# Patient Record
Sex: Male | Born: 1935 | Race: White | Hispanic: No | Marital: Married | Smoking: Former smoker
Health system: Southern US, Community
[De-identification: ages and names within clinical notes are randomized; demographics above are authoritative.]

## PROBLEM LIST (undated history)

## (undated) DIAGNOSIS — J449 Chronic obstructive pulmonary disease, unspecified: Secondary | ICD-10-CM

## (undated) DIAGNOSIS — Z8669 Personal history of other diseases of the nervous system and sense organs: Secondary | ICD-10-CM

## (undated) DIAGNOSIS — N400 Enlarged prostate without lower urinary tract symptoms: Secondary | ICD-10-CM

## (undated) DIAGNOSIS — M5136 Other intervertebral disc degeneration, lumbar region: Secondary | ICD-10-CM

## (undated) DIAGNOSIS — I5030 Unspecified diastolic (congestive) heart failure: Secondary | ICD-10-CM

## (undated) DIAGNOSIS — H269 Unspecified cataract: Secondary | ICD-10-CM

## (undated) DIAGNOSIS — D696 Thrombocytopenia, unspecified: Secondary | ICD-10-CM

## (undated) DIAGNOSIS — H409 Unspecified glaucoma: Secondary | ICD-10-CM

## (undated) DIAGNOSIS — I739 Peripheral vascular disease, unspecified: Secondary | ICD-10-CM

## (undated) DIAGNOSIS — M1990 Unspecified osteoarthritis, unspecified site: Secondary | ICD-10-CM

## (undated) DIAGNOSIS — I340 Nonrheumatic mitral (valve) insufficiency: Secondary | ICD-10-CM

## (undated) DIAGNOSIS — M51369 Other intervertebral disc degeneration, lumbar region without mention of lumbar back pain or lower extremity pain: Secondary | ICD-10-CM

## (undated) DIAGNOSIS — Z8739 Personal history of other diseases of the musculoskeletal system and connective tissue: Secondary | ICD-10-CM

## (undated) DIAGNOSIS — I10 Essential (primary) hypertension: Secondary | ICD-10-CM

## (undated) HISTORY — DX: Nonrheumatic mitral (valve) insufficiency: I34.0

## (undated) HISTORY — DX: Other intervertebral disc degeneration, lumbar region: M51.36

## (undated) HISTORY — DX: Unspecified osteoarthritis, unspecified site: M19.90

## (undated) HISTORY — DX: Personal history of other diseases of the nervous system and sense organs: Z86.69

## (undated) HISTORY — DX: Benign prostatic hyperplasia without lower urinary tract symptoms: N40.0

## (undated) HISTORY — DX: Unspecified diastolic (congestive) heart failure: I50.30

## (undated) HISTORY — DX: Peripheral vascular disease, unspecified: I73.9

## (undated) HISTORY — DX: Unspecified glaucoma: H40.9

## (undated) HISTORY — DX: Unspecified cataract: H26.9

## (undated) HISTORY — DX: Personal history of other diseases of the musculoskeletal system and connective tissue: Z87.39

## (undated) HISTORY — DX: Thrombocytopenia, unspecified: D69.6

## (undated) HISTORY — PX: CARPAL TUNNEL RELEASE: SHX101

## (undated) HISTORY — DX: Other intervertebral disc degeneration, lumbar region without mention of lumbar back pain or lower extremity pain: M51.369

## (undated) HISTORY — PX: TOTAL KNEE ARTHROPLASTY: SHX125

## (undated) HISTORY — DX: Chronic obstructive pulmonary disease, unspecified: J44.9

---
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
## 2008-05-10 ENCOUNTER — Ambulatory Visit: Payer: Self-pay | Admitting: Orthopedic Surgery

## 2008-05-18 ENCOUNTER — Ambulatory Visit: Payer: Self-pay | Admitting: Orthopedic Surgery

## 2010-01-24 ENCOUNTER — Ambulatory Visit: Payer: Self-pay | Admitting: Family Medicine

## 2010-05-22 NOTE — Assessment & Plan Note (Signed)
Summary: FLU SHOT/EVM    The patient and/or caregiver has been counseled thoroughly with regard to medications prescribed including dosage, schedule, interactions, rationale for use, and possible side effects and they verbalize understanding.  Diagnoses and expected course of recovery discussed and will return if not improved as expected or if the condition worsens. Patient and/or caregiver verbalized understanding.    Immunizations Administered:  Influenza Vaccine:    Vaccine Type: FLULAVAL    Site: left deltoid    Mfr: GlaxoSmithKline    Dose: 0.5 ml    Route: IM    Given by: Levonne Spiller EMT-P    Exp. Date: 09/20/2010    Lot #: [id number]    VIS given: 11/14/09 version given January 24, 2010.  Flu Vaccine Consent Questions:    Do you have a history of severe allergic reactions to this vaccine? no    Any prior history of allergic reactions to egg and/or gelatin? no    Do you have a sensitivity to the preservative Thimersol? no    Do you have a past history of Guillan-Barre Syndrome? no    Do you currently have an acute febrile illness? no    Have you ever had a severe reaction to latex? no    Vaccine information given and explained to patient? yes

## 2011-09-20 DIAGNOSIS — I10 Essential (primary) hypertension: Secondary | ICD-10-CM | POA: Diagnosis not present

## 2011-09-20 DIAGNOSIS — Z0000 Encounter for general adult medical examination without abnormal findings: Secondary | ICD-10-CM | POA: Diagnosis not present

## 2011-09-20 DIAGNOSIS — N400 Enlarged prostate without lower urinary tract symptoms: Secondary | ICD-10-CM | POA: Diagnosis not present

## 2011-09-23 DIAGNOSIS — I10 Essential (primary) hypertension: Secondary | ICD-10-CM | POA: Diagnosis not present

## 2011-09-23 DIAGNOSIS — N400 Enlarged prostate without lower urinary tract symptoms: Secondary | ICD-10-CM | POA: Diagnosis not present

## 2011-09-23 DIAGNOSIS — Z0000 Encounter for general adult medical examination without abnormal findings: Secondary | ICD-10-CM | POA: Diagnosis not present

## 2011-09-30 DIAGNOSIS — R6889 Other general symptoms and signs: Secondary | ICD-10-CM | POA: Diagnosis not present

## 2011-10-10 ENCOUNTER — Ambulatory Visit: Payer: Self-pay | Admitting: Internal Medicine

## 2011-10-10 DIAGNOSIS — D696 Thrombocytopenia, unspecified: Secondary | ICD-10-CM | POA: Diagnosis not present

## 2011-10-10 DIAGNOSIS — Z87891 Personal history of nicotine dependence: Secondary | ICD-10-CM | POA: Diagnosis not present

## 2011-10-10 DIAGNOSIS — Z1159 Encounter for screening for other viral diseases: Secondary | ICD-10-CM | POA: Diagnosis not present

## 2011-10-10 DIAGNOSIS — I10 Essential (primary) hypertension: Secondary | ICD-10-CM | POA: Diagnosis not present

## 2011-10-10 DIAGNOSIS — M129 Arthropathy, unspecified: Secondary | ICD-10-CM | POA: Diagnosis not present

## 2011-10-10 DIAGNOSIS — Z79899 Other long term (current) drug therapy: Secondary | ICD-10-CM | POA: Diagnosis not present

## 2011-10-10 DIAGNOSIS — IMO0002 Reserved for concepts with insufficient information to code with codable children: Secondary | ICD-10-CM | POA: Diagnosis not present

## 2011-10-10 DIAGNOSIS — Z7982 Long term (current) use of aspirin: Secondary | ICD-10-CM | POA: Diagnosis not present

## 2011-10-10 LAB — CBC CANCER CENTER
Basophil #: 0 x10 3/mm (ref 0.0–0.1)
Basophil %: 0.8 %
HGB: 13.9 g/dL (ref 13.0–18.0)
MCH: 31.6 pg (ref 26.0–34.0)
MCHC: 33.3 g/dL (ref 32.0–36.0)
Neutrophil #: 3.4 x10 3/mm (ref 1.4–6.5)
Neutrophil %: 61.5 %
RBC: 4.39 10*6/uL — ABNORMAL LOW (ref 4.40–5.90)
RDW: 13.8 % (ref 11.5–14.5)
WBC: 5.6 x10 3/mm (ref 3.8–10.6)

## 2011-10-21 ENCOUNTER — Ambulatory Visit: Payer: Self-pay | Admitting: Internal Medicine

## 2011-10-21 DIAGNOSIS — M129 Arthropathy, unspecified: Secondary | ICD-10-CM | POA: Diagnosis not present

## 2011-10-21 DIAGNOSIS — Z7982 Long term (current) use of aspirin: Secondary | ICD-10-CM | POA: Diagnosis not present

## 2011-10-21 DIAGNOSIS — Z79899 Other long term (current) drug therapy: Secondary | ICD-10-CM | POA: Diagnosis not present

## 2011-10-21 DIAGNOSIS — K8020 Calculus of gallbladder without cholecystitis without obstruction: Secondary | ICD-10-CM | POA: Diagnosis not present

## 2011-10-21 DIAGNOSIS — IMO0002 Reserved for concepts with insufficient information to code with codable children: Secondary | ICD-10-CM | POA: Diagnosis not present

## 2011-10-21 DIAGNOSIS — I10 Essential (primary) hypertension: Secondary | ICD-10-CM | POA: Diagnosis not present

## 2011-10-21 DIAGNOSIS — Q619 Cystic kidney disease, unspecified: Secondary | ICD-10-CM | POA: Diagnosis not present

## 2011-10-21 DIAGNOSIS — Z87891 Personal history of nicotine dependence: Secondary | ICD-10-CM | POA: Diagnosis not present

## 2011-10-21 DIAGNOSIS — D696 Thrombocytopenia, unspecified: Secondary | ICD-10-CM | POA: Diagnosis not present

## 2011-10-23 DIAGNOSIS — N400 Enlarged prostate without lower urinary tract symptoms: Secondary | ICD-10-CM | POA: Diagnosis not present

## 2011-10-23 DIAGNOSIS — M79609 Pain in unspecified limb: Secondary | ICD-10-CM | POA: Diagnosis not present

## 2011-10-23 DIAGNOSIS — D696 Thrombocytopenia, unspecified: Secondary | ICD-10-CM | POA: Diagnosis not present

## 2011-11-07 DIAGNOSIS — D696 Thrombocytopenia, unspecified: Secondary | ICD-10-CM | POA: Diagnosis not present

## 2011-11-12 DIAGNOSIS — K8020 Calculus of gallbladder without cholecystitis without obstruction: Secondary | ICD-10-CM | POA: Diagnosis not present

## 2011-11-21 ENCOUNTER — Ambulatory Visit: Payer: Self-pay | Admitting: Internal Medicine

## 2011-11-21 DIAGNOSIS — D696 Thrombocytopenia, unspecified: Secondary | ICD-10-CM | POA: Diagnosis not present

## 2011-11-21 DIAGNOSIS — I10 Essential (primary) hypertension: Secondary | ICD-10-CM | POA: Diagnosis not present

## 2011-11-21 DIAGNOSIS — Q619 Cystic kidney disease, unspecified: Secondary | ICD-10-CM | POA: Diagnosis not present

## 2011-11-21 DIAGNOSIS — K8020 Calculus of gallbladder without cholecystitis without obstruction: Secondary | ICD-10-CM | POA: Diagnosis not present

## 2011-11-21 DIAGNOSIS — Z7982 Long term (current) use of aspirin: Secondary | ICD-10-CM | POA: Diagnosis not present

## 2011-11-21 DIAGNOSIS — Z79899 Other long term (current) drug therapy: Secondary | ICD-10-CM | POA: Diagnosis not present

## 2011-11-21 DIAGNOSIS — IMO0002 Reserved for concepts with insufficient information to code with codable children: Secondary | ICD-10-CM | POA: Diagnosis not present

## 2011-11-21 DIAGNOSIS — M129 Arthropathy, unspecified: Secondary | ICD-10-CM | POA: Diagnosis not present

## 2011-11-21 DIAGNOSIS — Z87891 Personal history of nicotine dependence: Secondary | ICD-10-CM | POA: Diagnosis not present

## 2011-12-12 DIAGNOSIS — D696 Thrombocytopenia, unspecified: Secondary | ICD-10-CM | POA: Diagnosis not present

## 2011-12-12 LAB — CBC CANCER CENTER
Basophil #: 0 x10 3/mm (ref 0.0–0.1)
Eosinophil #: 0.1 x10 3/mm (ref 0.0–0.7)
HCT: 43.3 % (ref 40.0–52.0)
HGB: 14.4 g/dL (ref 13.0–18.0)
Lymphocyte #: 1.3 x10 3/mm (ref 1.0–3.6)
Lymphocyte %: 24.5 %
MCHC: 33.2 g/dL (ref 32.0–36.0)
MCV: 94 fL (ref 80–100)
Monocyte #: 0.4 x10 3/mm (ref 0.2–1.0)
Neutrophil #: 3.6 x10 3/mm (ref 1.4–6.5)
RBC: 4.6 10*6/uL (ref 4.40–5.90)
RDW: 14.6 % — ABNORMAL HIGH (ref 11.5–14.5)
WBC: 5.4 x10 3/mm (ref 3.8–10.6)

## 2011-12-12 LAB — URINALYSIS, COMPLETE
Bacteria: NONE SEEN
Ketone: NEGATIVE
Nitrite: NEGATIVE
Ph: 6 (ref 4.5–8.0)
Protein: NEGATIVE
Specific Gravity: 1.006 (ref 1.003–1.030)

## 2011-12-17 DIAGNOSIS — I10 Essential (primary) hypertension: Secondary | ICD-10-CM | POA: Diagnosis not present

## 2011-12-17 DIAGNOSIS — N400 Enlarged prostate without lower urinary tract symptoms: Secondary | ICD-10-CM | POA: Diagnosis not present

## 2011-12-17 DIAGNOSIS — M79609 Pain in unspecified limb: Secondary | ICD-10-CM | POA: Diagnosis not present

## 2011-12-17 DIAGNOSIS — D696 Thrombocytopenia, unspecified: Secondary | ICD-10-CM | POA: Diagnosis not present

## 2011-12-22 ENCOUNTER — Ambulatory Visit: Payer: Self-pay | Admitting: Internal Medicine

## 2011-12-22 DIAGNOSIS — D696 Thrombocytopenia, unspecified: Secondary | ICD-10-CM | POA: Diagnosis not present

## 2012-01-09 LAB — CANCER CTR PLATELET CT: Platelet: 88 x10 3/mm — ABNORMAL LOW (ref 150–440)

## 2012-01-15 DIAGNOSIS — M129 Arthropathy, unspecified: Secondary | ICD-10-CM | POA: Diagnosis not present

## 2012-01-15 DIAGNOSIS — I10 Essential (primary) hypertension: Secondary | ICD-10-CM | POA: Diagnosis not present

## 2012-01-15 DIAGNOSIS — N400 Enlarged prostate without lower urinary tract symptoms: Secondary | ICD-10-CM | POA: Diagnosis not present

## 2012-01-15 DIAGNOSIS — Z23 Encounter for immunization: Secondary | ICD-10-CM | POA: Diagnosis not present

## 2012-01-21 ENCOUNTER — Ambulatory Visit: Payer: Self-pay | Admitting: Internal Medicine

## 2012-01-21 DIAGNOSIS — IMO0002 Reserved for concepts with insufficient information to code with codable children: Secondary | ICD-10-CM | POA: Diagnosis not present

## 2012-01-21 DIAGNOSIS — Z87891 Personal history of nicotine dependence: Secondary | ICD-10-CM | POA: Diagnosis not present

## 2012-01-21 DIAGNOSIS — D696 Thrombocytopenia, unspecified: Secondary | ICD-10-CM | POA: Diagnosis not present

## 2012-01-21 DIAGNOSIS — I10 Essential (primary) hypertension: Secondary | ICD-10-CM | POA: Diagnosis not present

## 2012-01-21 DIAGNOSIS — Z79899 Other long term (current) drug therapy: Secondary | ICD-10-CM | POA: Diagnosis not present

## 2012-01-21 DIAGNOSIS — Z7982 Long term (current) use of aspirin: Secondary | ICD-10-CM | POA: Diagnosis not present

## 2012-01-21 DIAGNOSIS — M129 Arthropathy, unspecified: Secondary | ICD-10-CM | POA: Diagnosis not present

## 2012-02-06 LAB — CBC CANCER CENTER
Basophil %: 1.3 %
Eosinophil %: 2.2 %
HCT: 41.3 % (ref 40.0–52.0)
HGB: 13.8 g/dL (ref 13.0–18.0)
Lymphocyte #: 1.6 x10 3/mm (ref 1.0–3.6)
MCH: 31.4 pg (ref 26.0–34.0)
MCV: 94 fL (ref 80–100)
Monocyte #: 0.5 x10 3/mm (ref 0.2–1.0)
Neutrophil #: 3.3 x10 3/mm (ref 1.4–6.5)
Neutrophil %: 58.3 %
Platelet: 81 x10 3/mm — ABNORMAL LOW (ref 150–440)
RBC: 4.38 10*6/uL — ABNORMAL LOW (ref 4.40–5.90)

## 2012-02-19 DIAGNOSIS — D696 Thrombocytopenia, unspecified: Secondary | ICD-10-CM | POA: Diagnosis not present

## 2012-02-19 LAB — CBC CANCER CENTER
Basophil #: 0.1 x10 3/mm (ref 0.0–0.1)
Eosinophil %: 2.1 %
Lymphocyte #: 1.3 x10 3/mm (ref 1.0–3.6)
MCH: 31.1 pg (ref 26.0–34.0)
Monocyte #: 0.4 x10 3/mm (ref 0.2–1.0)
Neutrophil %: 62.5 %
Platelet: 84 x10 3/mm — ABNORMAL LOW (ref 150–440)
RBC: 4.47 10*6/uL (ref 4.40–5.90)
RDW: 14.2 % (ref 11.5–14.5)
WBC: 5 x10 3/mm (ref 3.8–10.6)

## 2012-02-21 ENCOUNTER — Ambulatory Visit: Payer: Self-pay | Admitting: Internal Medicine

## 2012-02-21 DIAGNOSIS — Z79899 Other long term (current) drug therapy: Secondary | ICD-10-CM | POA: Diagnosis not present

## 2012-02-21 DIAGNOSIS — I10 Essential (primary) hypertension: Secondary | ICD-10-CM | POA: Diagnosis not present

## 2012-02-21 DIAGNOSIS — Z87891 Personal history of nicotine dependence: Secondary | ICD-10-CM | POA: Diagnosis not present

## 2012-02-21 DIAGNOSIS — D696 Thrombocytopenia, unspecified: Secondary | ICD-10-CM | POA: Diagnosis not present

## 2012-02-21 DIAGNOSIS — Q619 Cystic kidney disease, unspecified: Secondary | ICD-10-CM | POA: Diagnosis not present

## 2012-02-21 DIAGNOSIS — K8020 Calculus of gallbladder without cholecystitis without obstruction: Secondary | ICD-10-CM | POA: Diagnosis not present

## 2012-02-21 DIAGNOSIS — Z7982 Long term (current) use of aspirin: Secondary | ICD-10-CM | POA: Diagnosis not present

## 2012-02-21 DIAGNOSIS — M129 Arthropathy, unspecified: Secondary | ICD-10-CM | POA: Diagnosis not present

## 2012-02-21 DIAGNOSIS — IMO0002 Reserved for concepts with insufficient information to code with codable children: Secondary | ICD-10-CM | POA: Diagnosis not present

## 2012-02-24 DIAGNOSIS — M129 Arthropathy, unspecified: Secondary | ICD-10-CM | POA: Diagnosis not present

## 2012-02-24 DIAGNOSIS — I10 Essential (primary) hypertension: Secondary | ICD-10-CM | POA: Diagnosis not present

## 2012-02-24 DIAGNOSIS — M79609 Pain in unspecified limb: Secondary | ICD-10-CM | POA: Diagnosis not present

## 2012-02-24 DIAGNOSIS — N400 Enlarged prostate without lower urinary tract symptoms: Secondary | ICD-10-CM | POA: Diagnosis not present

## 2012-03-16 LAB — CBC CANCER CENTER
Basophil #: 0 x10 3/mm (ref 0.0–0.1)
Basophil %: 0.1 %
Eosinophil #: 0.1 x10 3/mm (ref 0.0–0.7)
Eosinophil %: 1.6 %
HGB: 13.9 g/dL (ref 13.0–18.0)
Lymphocyte #: 1.3 x10 3/mm (ref 1.0–3.6)
Lymphocyte %: 23.4 %
MCV: 95 fL (ref 80–100)
Monocyte #: 0.4 x10 3/mm (ref 0.2–1.0)
Neutrophil #: 3.6 x10 3/mm (ref 1.4–6.5)
Neutrophil %: 67.1 %
RBC: 4.46 10*6/uL (ref 4.40–5.90)
RDW: 14.5 % (ref 11.5–14.5)
WBC: 5.4 x10 3/mm (ref 3.8–10.6)

## 2012-03-22 ENCOUNTER — Ambulatory Visit: Payer: Self-pay | Admitting: Internal Medicine

## 2012-04-22 ENCOUNTER — Ambulatory Visit: Payer: Self-pay | Admitting: Internal Medicine

## 2012-04-22 DIAGNOSIS — D696 Thrombocytopenia, unspecified: Secondary | ICD-10-CM | POA: Diagnosis not present

## 2012-05-20 LAB — CBC CANCER CENTER
Basophil #: 0.1 x10 3/mm (ref 0.0–0.1)
Basophil %: 1.1 %
Lymphocyte #: 1.5 x10 3/mm (ref 1.0–3.6)
MCH: 31.7 pg (ref 26.0–34.0)
MCHC: 34.4 g/dL (ref 32.0–36.0)
MCV: 92 fL (ref 80–100)
Monocyte #: 0.5 x10 3/mm (ref 0.2–1.0)
Neutrophil %: 62.8 %
RBC: 4.67 10*6/uL (ref 4.40–5.90)
RDW: 14.8 % — ABNORMAL HIGH (ref 11.5–14.5)
WBC: 5.7 x10 3/mm (ref 3.8–10.6)

## 2012-05-23 ENCOUNTER — Ambulatory Visit: Payer: Self-pay | Admitting: Internal Medicine

## 2012-07-21 ENCOUNTER — Ambulatory Visit: Payer: Self-pay | Admitting: Internal Medicine

## 2012-07-21 DIAGNOSIS — D696 Thrombocytopenia, unspecified: Secondary | ICD-10-CM | POA: Diagnosis not present

## 2012-08-19 LAB — CBC CANCER CENTER
Eosinophil #: 0.1 x10 3/mm (ref 0.0–0.7)
Eosinophil %: 2.3 %
HCT: 43.8 % (ref 40.0–52.0)
HGB: 14.7 g/dL (ref 13.0–18.0)
MCHC: 33.5 g/dL (ref 32.0–36.0)
MCV: 93 fL (ref 80–100)
Monocyte #: 0.4 x10 3/mm (ref 0.2–1.0)
Monocyte %: 7 %
Neutrophil #: 3.3 x10 3/mm (ref 1.4–6.5)
Neutrophil %: 60.2 %
Platelet: 81 x10 3/mm — ABNORMAL LOW (ref 150–440)
RBC: 4.7 10*6/uL (ref 4.40–5.90)
RDW: 14.8 % — ABNORMAL HIGH (ref 11.5–14.5)

## 2012-08-20 ENCOUNTER — Ambulatory Visit: Payer: Self-pay | Admitting: Internal Medicine

## 2012-10-20 ENCOUNTER — Ambulatory Visit: Payer: Self-pay | Admitting: Internal Medicine

## 2012-10-20 DIAGNOSIS — D696 Thrombocytopenia, unspecified: Secondary | ICD-10-CM | POA: Diagnosis not present

## 2012-11-18 LAB — CBC CANCER CENTER
Basophil #: 0 x10 3/mm (ref 0.0–0.1)
HCT: 40.6 % (ref 40.0–52.0)
HGB: 14.3 g/dL (ref 13.0–18.0)
Lymphocyte #: 1.5 x10 3/mm (ref 1.0–3.6)
Lymphocyte %: 29.3 %
MCH: 32.5 pg (ref 26.0–34.0)
MCV: 92 fL (ref 80–100)
Monocyte %: 8.4 %
Neutrophil #: 3.2 x10 3/mm (ref 1.4–6.5)
RDW: 14.8 % — ABNORMAL HIGH (ref 11.5–14.5)
WBC: 5.3 x10 3/mm (ref 3.8–10.6)

## 2012-11-20 ENCOUNTER — Ambulatory Visit: Payer: Self-pay | Admitting: Internal Medicine

## 2012-12-28 DIAGNOSIS — Z23 Encounter for immunization: Secondary | ICD-10-CM | POA: Diagnosis not present

## 2012-12-28 DIAGNOSIS — M129 Arthropathy, unspecified: Secondary | ICD-10-CM | POA: Diagnosis not present

## 2012-12-28 DIAGNOSIS — N400 Enlarged prostate without lower urinary tract symptoms: Secondary | ICD-10-CM | POA: Diagnosis not present

## 2012-12-28 DIAGNOSIS — R5381 Other malaise: Secondary | ICD-10-CM | POA: Diagnosis not present

## 2012-12-28 DIAGNOSIS — I10 Essential (primary) hypertension: Secondary | ICD-10-CM | POA: Diagnosis not present

## 2013-02-16 ENCOUNTER — Ambulatory Visit: Payer: Self-pay | Admitting: Internal Medicine

## 2013-02-16 DIAGNOSIS — I10 Essential (primary) hypertension: Secondary | ICD-10-CM | POA: Diagnosis not present

## 2013-02-16 DIAGNOSIS — K5900 Constipation, unspecified: Secondary | ICD-10-CM | POA: Diagnosis not present

## 2013-02-16 DIAGNOSIS — Z87891 Personal history of nicotine dependence: Secondary | ICD-10-CM | POA: Diagnosis not present

## 2013-02-16 DIAGNOSIS — M129 Arthropathy, unspecified: Secondary | ICD-10-CM | POA: Diagnosis not present

## 2013-02-16 DIAGNOSIS — D696 Thrombocytopenia, unspecified: Secondary | ICD-10-CM | POA: Diagnosis not present

## 2013-02-16 DIAGNOSIS — Z800 Family history of malignant neoplasm of digestive organs: Secondary | ICD-10-CM | POA: Diagnosis not present

## 2013-02-16 DIAGNOSIS — Z79899 Other long term (current) drug therapy: Secondary | ICD-10-CM | POA: Diagnosis not present

## 2013-02-16 DIAGNOSIS — IMO0002 Reserved for concepts with insufficient information to code with codable children: Secondary | ICD-10-CM | POA: Diagnosis not present

## 2013-02-16 DIAGNOSIS — M2550 Pain in unspecified joint: Secondary | ICD-10-CM | POA: Diagnosis not present

## 2013-02-16 DIAGNOSIS — Z7982 Long term (current) use of aspirin: Secondary | ICD-10-CM | POA: Diagnosis not present

## 2013-02-16 DIAGNOSIS — R109 Unspecified abdominal pain: Secondary | ICD-10-CM | POA: Diagnosis not present

## 2013-02-17 DIAGNOSIS — Z7982 Long term (current) use of aspirin: Secondary | ICD-10-CM | POA: Diagnosis not present

## 2013-02-17 DIAGNOSIS — I10 Essential (primary) hypertension: Secondary | ICD-10-CM | POA: Diagnosis not present

## 2013-02-17 DIAGNOSIS — R109 Unspecified abdominal pain: Secondary | ICD-10-CM | POA: Diagnosis not present

## 2013-02-17 DIAGNOSIS — D696 Thrombocytopenia, unspecified: Secondary | ICD-10-CM | POA: Diagnosis not present

## 2013-02-17 DIAGNOSIS — Z79899 Other long term (current) drug therapy: Secondary | ICD-10-CM | POA: Diagnosis not present

## 2013-02-17 DIAGNOSIS — K5900 Constipation, unspecified: Secondary | ICD-10-CM | POA: Diagnosis not present

## 2013-02-17 LAB — CBC CANCER CENTER
Basophil #: 0.1 x10 3/mm (ref 0.0–0.1)
Basophil %: 1.3 %
Eosinophil #: 0.1 x10 3/mm (ref 0.0–0.7)
Eosinophil %: 2.7 %
HCT: 40.6 % (ref 40.0–52.0)
HGB: 13.7 g/dL (ref 13.0–18.0)
Lymphocyte #: 1.4 x10 3/mm (ref 1.0–3.6)
MCH: 31.6 pg (ref 26.0–34.0)
MCHC: 33.7 g/dL (ref 32.0–36.0)
MCV: 94 fL (ref 80–100)
Monocyte #: 0.4 x10 3/mm (ref 0.2–1.0)
Monocyte %: 8.7 %
Neutrophil %: 60.3 %
Platelet: 78 x10 3/mm — ABNORMAL LOW (ref 150–440)
RBC: 4.34 10*6/uL — ABNORMAL LOW (ref 4.40–5.90)
RDW: 14.7 % — ABNORMAL HIGH (ref 11.5–14.5)

## 2013-02-20 ENCOUNTER — Ambulatory Visit: Payer: Self-pay | Admitting: Internal Medicine

## 2013-02-20 DIAGNOSIS — I10 Essential (primary) hypertension: Secondary | ICD-10-CM | POA: Diagnosis not present

## 2013-02-20 DIAGNOSIS — Z7982 Long term (current) use of aspirin: Secondary | ICD-10-CM | POA: Diagnosis not present

## 2013-02-20 DIAGNOSIS — D696 Thrombocytopenia, unspecified: Secondary | ICD-10-CM | POA: Diagnosis not present

## 2013-02-20 DIAGNOSIS — Z79899 Other long term (current) drug therapy: Secondary | ICD-10-CM | POA: Diagnosis not present

## 2013-03-05 DIAGNOSIS — R5381 Other malaise: Secondary | ICD-10-CM | POA: Diagnosis not present

## 2013-03-05 DIAGNOSIS — J069 Acute upper respiratory infection, unspecified: Secondary | ICD-10-CM | POA: Diagnosis not present

## 2013-03-10 DIAGNOSIS — R109 Unspecified abdominal pain: Secondary | ICD-10-CM | POA: Diagnosis not present

## 2013-03-10 DIAGNOSIS — R197 Diarrhea, unspecified: Secondary | ICD-10-CM | POA: Diagnosis not present

## 2013-03-15 DIAGNOSIS — D696 Thrombocytopenia, unspecified: Secondary | ICD-10-CM | POA: Diagnosis not present

## 2013-03-15 LAB — CBC CANCER CENTER
Basophil #: 0.1 x10 3/mm (ref 0.0–0.1)
Basophil %: 1 %
Eosinophil #: 0.1 x10 3/mm (ref 0.0–0.7)
Eosinophil %: 1.8 %
HGB: 14.1 g/dL (ref 13.0–18.0)
Lymphocyte %: 26.7 %
MCH: 31.3 pg (ref 26.0–34.0)
MCHC: 33.2 g/dL (ref 32.0–36.0)
Monocyte #: 0.5 x10 3/mm (ref 0.2–1.0)
Neutrophil #: 3.8 x10 3/mm (ref 1.4–6.5)
Neutrophil %: 62.1 %
Platelet: 92 x10 3/mm — ABNORMAL LOW (ref 150–440)
RBC: 4.52 10*6/uL (ref 4.40–5.90)
RDW: 14.9 % — ABNORMAL HIGH (ref 11.5–14.5)
WBC: 6 x10 3/mm (ref 3.8–10.6)

## 2013-03-22 ENCOUNTER — Ambulatory Visit: Payer: Self-pay | Admitting: Internal Medicine

## 2013-04-08 DIAGNOSIS — H409 Unspecified glaucoma: Secondary | ICD-10-CM | POA: Diagnosis not present

## 2013-04-08 DIAGNOSIS — R5381 Other malaise: Secondary | ICD-10-CM | POA: Diagnosis not present

## 2013-04-08 DIAGNOSIS — I10 Essential (primary) hypertension: Secondary | ICD-10-CM | POA: Diagnosis not present

## 2013-04-08 DIAGNOSIS — N400 Enlarged prostate without lower urinary tract symptoms: Secondary | ICD-10-CM | POA: Diagnosis not present

## 2013-04-22 HISTORY — PX: COLONOSCOPY: SHX174

## 2013-05-03 ENCOUNTER — Ambulatory Visit: Payer: Self-pay | Admitting: Internal Medicine

## 2013-05-03 DIAGNOSIS — D696 Thrombocytopenia, unspecified: Secondary | ICD-10-CM | POA: Diagnosis not present

## 2013-05-03 LAB — CBC CANCER CENTER
Basophil #: 0.1 x10 3/mm (ref 0.0–0.1)
Basophil %: 1.1 %
EOS PCT: 4.1 %
Eosinophil #: 0.2 x10 3/mm (ref 0.0–0.7)
HCT: 41.4 % (ref 40.0–52.0)
HGB: 13.6 g/dL (ref 13.0–18.0)
LYMPHS PCT: 23.2 %
Lymphocyte #: 1.3 x10 3/mm (ref 1.0–3.6)
MCH: 31.2 pg (ref 26.0–34.0)
MCHC: 32.8 g/dL (ref 32.0–36.0)
MCV: 95 fL (ref 80–100)
Monocyte #: 0.4 x10 3/mm (ref 0.2–1.0)
Monocyte %: 7.2 %
NEUTROS ABS: 3.7 x10 3/mm (ref 1.4–6.5)
NEUTROS PCT: 64.4 %
Platelet: 86 x10 3/mm — ABNORMAL LOW (ref 150–440)
RBC: 4.36 10*6/uL — ABNORMAL LOW (ref 4.40–5.90)
RDW: 14.9 % — AB (ref 11.5–14.5)
WBC: 5.8 x10 3/mm (ref 3.8–10.6)

## 2013-05-04 ENCOUNTER — Ambulatory Visit: Payer: Self-pay | Admitting: Gastroenterology

## 2013-05-04 DIAGNOSIS — K648 Other hemorrhoids: Secondary | ICD-10-CM | POA: Diagnosis not present

## 2013-05-04 DIAGNOSIS — Z96659 Presence of unspecified artificial knee joint: Secondary | ICD-10-CM | POA: Diagnosis not present

## 2013-05-04 DIAGNOSIS — Z87891 Personal history of nicotine dependence: Secondary | ICD-10-CM | POA: Diagnosis not present

## 2013-05-04 DIAGNOSIS — H409 Unspecified glaucoma: Secondary | ICD-10-CM | POA: Diagnosis not present

## 2013-05-04 DIAGNOSIS — I10 Essential (primary) hypertension: Secondary | ICD-10-CM | POA: Diagnosis not present

## 2013-05-04 DIAGNOSIS — Z79899 Other long term (current) drug therapy: Secondary | ICD-10-CM | POA: Diagnosis not present

## 2013-05-04 DIAGNOSIS — M1990 Unspecified osteoarthritis, unspecified site: Secondary | ICD-10-CM | POA: Diagnosis not present

## 2013-05-04 DIAGNOSIS — Z7982 Long term (current) use of aspirin: Secondary | ICD-10-CM | POA: Diagnosis not present

## 2013-05-04 DIAGNOSIS — R197 Diarrhea, unspecified: Secondary | ICD-10-CM | POA: Diagnosis not present

## 2013-05-04 DIAGNOSIS — D126 Benign neoplasm of colon, unspecified: Secondary | ICD-10-CM | POA: Diagnosis not present

## 2013-05-07 LAB — PATHOLOGY REPORT

## 2013-05-23 ENCOUNTER — Ambulatory Visit: Payer: Self-pay | Admitting: Internal Medicine

## 2013-07-07 DIAGNOSIS — N400 Enlarged prostate without lower urinary tract symptoms: Secondary | ICD-10-CM | POA: Diagnosis not present

## 2013-07-07 DIAGNOSIS — I10 Essential (primary) hypertension: Secondary | ICD-10-CM | POA: Diagnosis not present

## 2013-07-07 DIAGNOSIS — H409 Unspecified glaucoma: Secondary | ICD-10-CM | POA: Diagnosis not present

## 2013-07-07 DIAGNOSIS — M129 Arthropathy, unspecified: Secondary | ICD-10-CM | POA: Diagnosis not present

## 2013-07-08 DIAGNOSIS — I10 Essential (primary) hypertension: Secondary | ICD-10-CM | POA: Diagnosis not present

## 2013-07-30 ENCOUNTER — Ambulatory Visit: Payer: Self-pay | Admitting: Internal Medicine

## 2013-07-30 DIAGNOSIS — D696 Thrombocytopenia, unspecified: Secondary | ICD-10-CM | POA: Diagnosis not present

## 2013-08-02 DIAGNOSIS — D696 Thrombocytopenia, unspecified: Secondary | ICD-10-CM | POA: Diagnosis not present

## 2013-08-02 LAB — CBC CANCER CENTER
BASOS ABS: 0 x10 3/mm (ref 0.0–0.1)
Basophil %: 0.1 %
Eosinophil #: 0.1 x10 3/mm (ref 0.0–0.7)
Eosinophil %: 2.1 %
HCT: 41.9 % (ref 40.0–52.0)
HGB: 13.9 g/dL (ref 13.0–18.0)
LYMPHS PCT: 26.7 %
Lymphocyte #: 1.5 x10 3/mm (ref 1.0–3.6)
MCH: 31.3 pg (ref 26.0–34.0)
MCHC: 33.2 g/dL (ref 32.0–36.0)
MCV: 94 fL (ref 80–100)
MONOS PCT: 7.1 %
Monocyte #: 0.4 x10 3/mm (ref 0.2–1.0)
NEUTROS ABS: 3.5 x10 3/mm (ref 1.4–6.5)
Neutrophil %: 64 %
Platelet: 93 x10 3/mm — ABNORMAL LOW (ref 150–440)
RBC: 4.45 10*6/uL (ref 4.40–5.90)
RDW: 14.4 % (ref 11.5–14.5)
WBC: 5.5 x10 3/mm (ref 3.8–10.6)

## 2013-08-20 ENCOUNTER — Ambulatory Visit: Payer: Self-pay | Admitting: Internal Medicine

## 2013-09-08 DIAGNOSIS — M129 Arthropathy, unspecified: Secondary | ICD-10-CM | POA: Diagnosis not present

## 2013-09-08 DIAGNOSIS — N400 Enlarged prostate without lower urinary tract symptoms: Secondary | ICD-10-CM | POA: Diagnosis not present

## 2013-09-08 DIAGNOSIS — I10 Essential (primary) hypertension: Secondary | ICD-10-CM | POA: Diagnosis not present

## 2013-09-09 DIAGNOSIS — N400 Enlarged prostate without lower urinary tract symptoms: Secondary | ICD-10-CM | POA: Diagnosis not present

## 2013-11-01 ENCOUNTER — Ambulatory Visit: Payer: Self-pay | Admitting: Internal Medicine

## 2013-11-01 DIAGNOSIS — D696 Thrombocytopenia, unspecified: Secondary | ICD-10-CM | POA: Diagnosis not present

## 2013-11-01 LAB — CBC CANCER CENTER
Basophil #: 0.1 x10 3/mm (ref 0.0–0.1)
Basophil %: 0.9 %
Eosinophil #: 0.1 x10 3/mm (ref 0.0–0.7)
Eosinophil %: 1.8 %
HCT: 43.6 % (ref 40.0–52.0)
HGB: 14.6 g/dL (ref 13.0–18.0)
Lymphocyte #: 1.5 x10 3/mm (ref 1.0–3.6)
Lymphocyte %: 24.2 %
MCH: 32 pg (ref 26.0–34.0)
MCHC: 33.6 g/dL (ref 32.0–36.0)
MCV: 95 fL (ref 80–100)
MONOS PCT: 7.2 %
Monocyte #: 0.4 x10 3/mm (ref 0.2–1.0)
NEUTROS PCT: 65.9 %
Neutrophil #: 4.1 x10 3/mm (ref 1.4–6.5)
Platelet: 82 x10 3/mm — ABNORMAL LOW (ref 150–440)
RBC: 4.57 10*6/uL (ref 4.40–5.90)
RDW: 15.4 % — ABNORMAL HIGH (ref 11.5–14.5)
WBC: 6.2 x10 3/mm (ref 3.8–10.6)

## 2013-11-10 DIAGNOSIS — H409 Unspecified glaucoma: Secondary | ICD-10-CM | POA: Diagnosis not present

## 2013-11-10 DIAGNOSIS — M129 Arthropathy, unspecified: Secondary | ICD-10-CM | POA: Diagnosis not present

## 2013-11-10 DIAGNOSIS — I10 Essential (primary) hypertension: Secondary | ICD-10-CM | POA: Diagnosis not present

## 2013-11-10 DIAGNOSIS — N400 Enlarged prostate without lower urinary tract symptoms: Secondary | ICD-10-CM | POA: Diagnosis not present

## 2013-11-11 DIAGNOSIS — I10 Essential (primary) hypertension: Secondary | ICD-10-CM | POA: Diagnosis not present

## 2013-11-20 ENCOUNTER — Ambulatory Visit: Payer: Self-pay | Admitting: Internal Medicine

## 2014-01-31 ENCOUNTER — Ambulatory Visit: Payer: Self-pay | Admitting: Internal Medicine

## 2014-01-31 DIAGNOSIS — M129 Arthropathy, unspecified: Secondary | ICD-10-CM | POA: Diagnosis not present

## 2014-01-31 DIAGNOSIS — D696 Thrombocytopenia, unspecified: Secondary | ICD-10-CM | POA: Diagnosis not present

## 2014-01-31 DIAGNOSIS — Z800 Family history of malignant neoplasm of digestive organs: Secondary | ICD-10-CM | POA: Diagnosis not present

## 2014-01-31 DIAGNOSIS — M25571 Pain in right ankle and joints of right foot: Secondary | ICD-10-CM | POA: Diagnosis not present

## 2014-01-31 DIAGNOSIS — Z87891 Personal history of nicotine dependence: Secondary | ICD-10-CM | POA: Diagnosis not present

## 2014-01-31 DIAGNOSIS — I10 Essential (primary) hypertension: Secondary | ICD-10-CM | POA: Diagnosis not present

## 2014-01-31 DIAGNOSIS — M25572 Pain in left ankle and joints of left foot: Secondary | ICD-10-CM | POA: Diagnosis not present

## 2014-01-31 DIAGNOSIS — Z79899 Other long term (current) drug therapy: Secondary | ICD-10-CM | POA: Diagnosis not present

## 2014-01-31 DIAGNOSIS — H269 Unspecified cataract: Secondary | ICD-10-CM | POA: Diagnosis not present

## 2014-01-31 DIAGNOSIS — M549 Dorsalgia, unspecified: Secondary | ICD-10-CM | POA: Diagnosis not present

## 2014-01-31 DIAGNOSIS — Z96651 Presence of right artificial knee joint: Secondary | ICD-10-CM | POA: Diagnosis not present

## 2014-01-31 DIAGNOSIS — Z7982 Long term (current) use of aspirin: Secondary | ICD-10-CM | POA: Diagnosis not present

## 2014-01-31 DIAGNOSIS — H409 Unspecified glaucoma: Secondary | ICD-10-CM | POA: Diagnosis not present

## 2014-01-31 LAB — CBC CANCER CENTER
BASOS PCT: 1.3 %
Basophil #: 0.1 x10 3/mm (ref 0.0–0.1)
EOS ABS: 0.2 x10 3/mm (ref 0.0–0.7)
EOS PCT: 2.7 %
HCT: 44.5 % (ref 40.0–52.0)
HGB: 14.7 g/dL (ref 13.0–18.0)
LYMPHS ABS: 1.6 x10 3/mm (ref 1.0–3.6)
LYMPHS PCT: 27.1 %
MCH: 32.1 pg (ref 26.0–34.0)
MCHC: 33 g/dL (ref 32.0–36.0)
MCV: 97 fL (ref 80–100)
MONO ABS: 0.4 x10 3/mm (ref 0.2–1.0)
Monocyte %: 7.5 %
Neutrophil #: 3.6 x10 3/mm (ref 1.4–6.5)
Neutrophil %: 61.4 %
PLATELETS: 89 x10 3/mm — AB (ref 150–440)
RBC: 4.58 10*6/uL (ref 4.40–5.90)
RDW: 14.6 % — ABNORMAL HIGH (ref 11.5–14.5)
WBC: 5.8 x10 3/mm (ref 3.8–10.6)

## 2014-02-20 ENCOUNTER — Ambulatory Visit: Payer: Self-pay | Admitting: Internal Medicine

## 2014-02-23 DIAGNOSIS — M79671 Pain in right foot: Secondary | ICD-10-CM | POA: Diagnosis not present

## 2014-02-23 DIAGNOSIS — N400 Enlarged prostate without lower urinary tract symptoms: Secondary | ICD-10-CM | POA: Diagnosis not present

## 2014-02-23 DIAGNOSIS — H409 Unspecified glaucoma: Secondary | ICD-10-CM | POA: Diagnosis not present

## 2014-02-23 DIAGNOSIS — I10 Essential (primary) hypertension: Secondary | ICD-10-CM | POA: Diagnosis not present

## 2014-02-23 DIAGNOSIS — M1990 Unspecified osteoarthritis, unspecified site: Secondary | ICD-10-CM | POA: Diagnosis not present

## 2014-02-23 DIAGNOSIS — Z23 Encounter for immunization: Secondary | ICD-10-CM | POA: Diagnosis not present

## 2014-02-23 DIAGNOSIS — R7301 Impaired fasting glucose: Secondary | ICD-10-CM | POA: Diagnosis not present

## 2014-05-04 ENCOUNTER — Ambulatory Visit: Payer: Self-pay | Admitting: Internal Medicine

## 2014-05-04 DIAGNOSIS — D696 Thrombocytopenia, unspecified: Secondary | ICD-10-CM | POA: Diagnosis not present

## 2014-05-04 LAB — CBC CANCER CENTER
Basophil #: 0 x10 3/mm (ref 0.0–0.1)
Basophil %: 0.2 %
EOS ABS: 0.1 x10 3/mm (ref 0.0–0.7)
Eosinophil %: 2.4 %
HCT: 44.6 % (ref 40.0–52.0)
HGB: 15 g/dL (ref 13.0–18.0)
Lymphocyte #: 1.6 x10 3/mm (ref 1.0–3.6)
Lymphocyte %: 27.2 %
MCH: 32 pg (ref 26.0–34.0)
MCHC: 33.7 g/dL (ref 32.0–36.0)
MCV: 95 fL (ref 80–100)
MONO ABS: 0.5 x10 3/mm (ref 0.2–1.0)
Monocyte %: 8.2 %
NEUTROS ABS: 3.8 x10 3/mm (ref 1.4–6.5)
NEUTROS PCT: 62 %
Platelet: 95 x10 3/mm — ABNORMAL LOW (ref 150–440)
RBC: 4.69 10*6/uL (ref 4.40–5.90)
RDW: 14.9 % — AB (ref 11.5–14.5)
WBC: 6.1 x10 3/mm (ref 3.8–10.6)

## 2014-05-23 ENCOUNTER — Ambulatory Visit: Payer: Self-pay | Admitting: Internal Medicine

## 2014-08-03 ENCOUNTER — Ambulatory Visit
Admit: 2014-08-03 | Disposition: A | Discharge status: Home / Self Care | Payer: Self-pay | Attending: Internal Medicine | Admitting: Internal Medicine

## 2014-08-03 DIAGNOSIS — D696 Thrombocytopenia, unspecified: Secondary | ICD-10-CM | POA: Diagnosis not present

## 2014-08-03 LAB — CBC CANCER CENTER
BASOS ABS: 0 x10 3/mm (ref 0.0–0.1)
Basophil %: 0.3 %
Eosinophil #: 0.1 x10 3/mm (ref 0.0–0.7)
Eosinophil %: 2.1 %
HCT: 43.9 % (ref 40.0–52.0)
HGB: 14.8 g/dL (ref 13.0–18.0)
Lymphocyte #: 1.6 x10 3/mm (ref 1.0–3.6)
Lymphocyte %: 26.8 %
MCH: 32.3 pg (ref 26.0–34.0)
MCHC: 33.8 g/dL (ref 32.0–36.0)
MCV: 96 fL (ref 80–100)
MONOS PCT: 8.7 %
Monocyte #: 0.5 x10 3/mm (ref 0.2–1.0)
NEUTROS PCT: 62.1 %
Neutrophil #: 3.8 x10 3/mm (ref 1.4–6.5)
Platelet: 89 x10 3/mm — ABNORMAL LOW (ref 150–440)
RBC: 4.59 10*6/uL (ref 4.40–5.90)
RDW: 14.6 % — AB (ref 11.5–14.5)
WBC: 6 x10 3/mm (ref 3.8–10.6)

## 2014-08-16 DIAGNOSIS — I10 Essential (primary) hypertension: Secondary | ICD-10-CM | POA: Diagnosis not present

## 2014-08-16 DIAGNOSIS — Z9181 History of falling: Secondary | ICD-10-CM | POA: Diagnosis not present

## 2014-08-16 DIAGNOSIS — H409 Unspecified glaucoma: Secondary | ICD-10-CM | POA: Diagnosis not present

## 2014-08-16 DIAGNOSIS — R739 Hyperglycemia, unspecified: Secondary | ICD-10-CM | POA: Diagnosis not present

## 2014-08-16 DIAGNOSIS — N401 Enlarged prostate with lower urinary tract symptoms: Secondary | ICD-10-CM | POA: Diagnosis not present

## 2014-08-16 DIAGNOSIS — M1990 Unspecified osteoarthritis, unspecified site: Secondary | ICD-10-CM | POA: Diagnosis not present

## 2014-08-16 DIAGNOSIS — G629 Polyneuropathy, unspecified: Secondary | ICD-10-CM | POA: Diagnosis not present

## 2014-08-16 DIAGNOSIS — M79671 Pain in right foot: Secondary | ICD-10-CM | POA: Diagnosis not present

## 2014-11-01 ENCOUNTER — Other Ambulatory Visit: Payer: Self-pay

## 2014-11-01 DIAGNOSIS — D696 Thrombocytopenia, unspecified: Secondary | ICD-10-CM

## 2014-11-02 ENCOUNTER — Inpatient Hospital Stay: Payer: Medicare Other | Attending: Family Medicine

## 2014-11-02 DIAGNOSIS — D473 Essential (hemorrhagic) thrombocythemia: Secondary | ICD-10-CM | POA: Diagnosis not present

## 2014-11-02 DIAGNOSIS — D696 Thrombocytopenia, unspecified: Secondary | ICD-10-CM

## 2014-11-02 LAB — CBC
HCT: 43.7 % (ref 40.0–52.0)
Hemoglobin: 14.5 g/dL (ref 13.0–18.0)
MCH: 31.9 pg (ref 26.0–34.0)
MCHC: 33.1 g/dL (ref 32.0–36.0)
MCV: 96.3 fL (ref 80.0–100.0)
Platelets: 96 10*3/uL — ABNORMAL LOW (ref 150–440)
RBC: 4.54 MIL/uL (ref 4.40–5.90)
RDW: 15.3 % — ABNORMAL HIGH (ref 11.5–14.5)
WBC: 5.3 10*3/uL (ref 3.8–10.6)

## 2015-02-03 ENCOUNTER — Encounter: Payer: Self-pay | Admitting: Internal Medicine

## 2015-02-03 ENCOUNTER — Inpatient Hospital Stay (HOSPITAL_BASED_OUTPATIENT_CLINIC_OR_DEPARTMENT_OTHER): Payer: Medicare Other | Admitting: Internal Medicine

## 2015-02-03 ENCOUNTER — Other Ambulatory Visit: Payer: Self-pay | Admitting: *Deleted

## 2015-02-03 ENCOUNTER — Inpatient Hospital Stay: Payer: Medicare Other | Attending: Internal Medicine

## 2015-02-03 VITALS — BP 163/88 | HR 67 | Temp 97.0°F | Resp 18 | Ht 71.0 in | Wt 218.3 lb

## 2015-02-03 DIAGNOSIS — M129 Arthropathy, unspecified: Secondary | ICD-10-CM

## 2015-02-03 DIAGNOSIS — Z800 Family history of malignant neoplasm of digestive organs: Secondary | ICD-10-CM | POA: Diagnosis not present

## 2015-02-03 DIAGNOSIS — R0609 Other forms of dyspnea: Secondary | ICD-10-CM | POA: Diagnosis not present

## 2015-02-03 DIAGNOSIS — Z23 Encounter for immunization: Secondary | ICD-10-CM | POA: Insufficient documentation

## 2015-02-03 DIAGNOSIS — D696 Thrombocytopenia, unspecified: Secondary | ICD-10-CM | POA: Diagnosis not present

## 2015-02-03 DIAGNOSIS — Z87891 Personal history of nicotine dependence: Secondary | ICD-10-CM

## 2015-02-03 DIAGNOSIS — Z7982 Long term (current) use of aspirin: Secondary | ICD-10-CM | POA: Diagnosis not present

## 2015-02-03 DIAGNOSIS — R5383 Other fatigue: Secondary | ICD-10-CM

## 2015-02-03 DIAGNOSIS — Z79899 Other long term (current) drug therapy: Secondary | ICD-10-CM | POA: Diagnosis not present

## 2015-02-03 DIAGNOSIS — M5136 Other intervertebral disc degeneration, lumbar region: Secondary | ICD-10-CM | POA: Insufficient documentation

## 2015-02-03 DIAGNOSIS — R030 Elevated blood-pressure reading, without diagnosis of hypertension: Secondary | ICD-10-CM

## 2015-02-03 DIAGNOSIS — Z8619 Personal history of other infectious and parasitic diseases: Secondary | ICD-10-CM | POA: Insufficient documentation

## 2015-02-03 LAB — CBC WITH DIFFERENTIAL/PLATELET
Basophils Absolute: 0 10*3/uL (ref 0–0.1)
Basophils Relative: 0 %
EOS ABS: 0.1 10*3/uL (ref 0–0.7)
EOS PCT: 2 %
HCT: 42.9 % (ref 40.0–52.0)
Hemoglobin: 14.6 g/dL (ref 13.0–18.0)
LYMPHS PCT: 29 %
Lymphs Abs: 1.7 10*3/uL (ref 1.0–3.6)
MCH: 32.6 pg (ref 26.0–34.0)
MCHC: 34.1 g/dL (ref 32.0–36.0)
MCV: 95.7 fL (ref 80.0–100.0)
MONO ABS: 0.4 10*3/uL (ref 0.2–1.0)
MONOS PCT: 8 %
Neutro Abs: 3.6 10*3/uL (ref 1.4–6.5)
Neutrophils Relative %: 61 %
PLATELETS: 97 10*3/uL — AB (ref 150–440)
RBC: 4.48 MIL/uL (ref 4.40–5.90)
RDW: 14.7 % — AB (ref 11.5–14.5)
WBC: 5.9 10*3/uL (ref 3.8–10.6)

## 2015-02-03 MED ORDER — INFLUENZA VAC SPLIT QUAD 0.5 ML IM SUSY
0.5000 mL | PREFILLED_SYRINGE | Freq: Once | INTRAMUSCULAR | Status: AC
  Administered 2015-02-03: 0.5 mL via INTRAMUSCULAR
  Filled 2015-02-03: qty 0.5

## 2015-02-03 NOTE — Progress Notes (Signed)
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  He denies any gum bleeding; denies any unusual bleeding from anywhere else. Denies any skin rash.  Denies any weight loss. Denies any night sweats. Denies any nausea vomiting chest pain.  Patient does complain of mild fatigue/shortness of breath on exertion.  REVIEW OF SYSTEMS:  A complete 10 point review of system is done which is negative except mentioned above/history of present illness.   PAST MEDICAL HISTORY :  Past Medical History  Diagnosis Date  . Thrombocytopenia ([place])   . Cataracts, bilateral   . H/O carpal tunnel syndrome   . Arthritis   . DDD (degenerative disc disease), lumbar   . Glaucoma   . History of osteomyelitis 1953    right leg    PAST SURGICAL HISTORY :   Past Surgical History  Procedure Laterality Date  . Leg surgery Right 1953  . Carpal tunnel release Left   . Carpal tunnel release Right   . Total knee arthroplasty Right   . Colonoscopy  2015    Dr. Allen Norris    FAMILY HISTORY :   Family History  Problem Relation Age of Onset  . Colon cancer Mother 32  . Thrombosis Father 54    SOCIAL HISTORY:   Social History  Substance Use Topics  . Smoking status: Former Smoker -- 1.00 packs/day for 30 years    Types: Cigarettes  . Smokeless tobacco: None     Comment: stopped at age 59; started in teens  . Alcohol Use: 0.0 oz/week    0 Standard drinks or equivalent per week     Comment: wine    ALLERGIES:  has No Known Allergies.  MEDICATIONS:  Current Outpatient Prescriptions  Medication Sig Dispense Refill  . aspirin 81 MG tablet Take 1 tablet by mouth daily.    . Calcium Carb-Cholecalciferol  (CALCIUM 600 + D) 600-200 MG-UNIT TABS Take 1 tablet by mouth daily.    . timolol (TIMOPTIC) 0.5 % ophthalmic solution 1 drop 2 (two) times daily.     Current Facility-Administered Medications  Medication Dose Route Frequency Provider Last Rate Last Dose  . Influenza vac split quadrivalent PF (FLUARIX) injection 0.5 mL  0.5 mL Intramuscular Once Cammie Sickle, MD        PHYSICAL EXAMINATION: ECOG PERFORMANCE STATUS: 0 - Asymptomatic  BP 174/105 mmHg  Pulse 73  Temp(Src) 97 F (36.1 C) (Tympanic)  Resp 18  Ht '5\' 11"'  (1.803 m)  Wt 218 lb 4.1 oz (99 kg)  BMI 30.45 kg/m2  SpO2 98%  Filed Weights   02/03/15 0900  Weight: 218 lb 4.1 oz (99 kg)    GENERAL: Well-nourished well-developed; Alert, no distress and comfortable.   Accompanied by his wife. EYES: no pallor or icterus OROPHARYNX: no thrush or ulceration; good dentition  NECK: supple, no masses felt LYMPH:  no palpable lymphadenopathy in the cervical, axillary or inguinal regions LUNGS: clear to auscultation and  No wheeze or crackles HEART/CVS: regular rate & rhythm and no murmurs; No lower extremity edema ABDOMEN:abdomen soft, non-tender and normal bowel sounds Musculoskeletal:no cyanosis of digits and no clubbing  PSYCH: alert & oriented x 3 with  fluent speech NEURO: no focal motor/sensory deficits SKIN:  no rashes or significant lesions  LABORATORY DATA:  I have reviewed the data as listed No results found for: NA, K, CL, CO2, GLUCOSE, BUN, CREATININE, CALCIUM, PROT, ALBUMIN, AST, ALT, ALKPHOS, BILITOT, GFRNONAA, GFRAA  No results found for: SPEP, UPEP  Lab Results  Component Value Date   WBC 5.9 02/03/2015   NEUTROABS 3.6 02/03/2015   HGB 14.6 02/03/2015   HCT 42.9 02/03/2015   MCV 95.7 02/03/2015   PLT 97* 02/03/2015      Chemistry   No results found for: NA, K, CL, CO2, BUN, CREATININE, GLU No results found for: CALCIUM, ALKPHOS, AST, ALT, BILITOT     RADIOGRAPHIC STUDIES: I have personally  reviewed the radiological images as listed and agreed with the findings in the report. No results found.   ASSESSMENT & PLAN:   # Chronic low platelets [~90s to 100] stable at least since 2006. The etiology is unclear- ITP versus MDS [never had bone marrow biopsy].   Patient continues to be asymptomatic without any bleeding episodes. Today's platelet count is 97; hemoglobin white count is normal.  I long discussion with the patient regarding the above possible etiologies; and the fact that his platelet counts have not changed significantly over many years is reassuring.   # Shortness of breath/fatigue-unclear etiology; defer to her PCP for further workup. Patient encouraged to talk to his PCP. # Elevated blood pressure; we'll repeat blood pressure today/and also follow up with PCP   No orders of the defined types were placed in this encounter.   All questions were answered. The patient knows to call the clinic with any problems, questions or concerns. No barriers to learning was detected.  I spent 15 minutes counseling the patient face to face. The total time spent in the appointment was 30 minutes and more than 50% was on counseling and review of test results     Cammie Sickle, MD 02/03/2015 9:28 AM

## 2015-02-13 ENCOUNTER — Ambulatory Visit (INDEPENDENT_AMBULATORY_CARE_PROVIDER_SITE_OTHER): Payer: Medicare Other | Admitting: Family Medicine

## 2015-02-13 ENCOUNTER — Encounter: Payer: Self-pay | Admitting: Family Medicine

## 2015-02-13 VITALS — BP 150/80 | HR 71 | Temp 97.7°F | Resp 16 | Ht 71.0 in | Wt 220.0 lb

## 2015-02-13 DIAGNOSIS — I10 Essential (primary) hypertension: Secondary | ICD-10-CM | POA: Diagnosis not present

## 2015-02-13 DIAGNOSIS — R5382 Chronic fatigue, unspecified: Secondary | ICD-10-CM | POA: Diagnosis not present

## 2015-02-13 DIAGNOSIS — J302 Other seasonal allergic rhinitis: Secondary | ICD-10-CM | POA: Diagnosis not present

## 2015-02-13 MED ORDER — LISINOPRIL 5 MG PO TABS: 5.0000 mg | ORAL_TABLET | Freq: Every day | ORAL | Status: DC

## 2015-02-13 MED ORDER — LORATADINE 10 MG PO TABS: 10.0000 mg | ORAL_TABLET | Freq: Every day | ORAL | Status: DC

## 2015-02-13 NOTE — Progress Notes (Signed)
Name: Raymond Barnett   MRN: [id number]    DOB: 1930-02-13   Date:02/13/2015       Progress Note  Subjective  Chief Complaint  Chief Complaint  Patient presents with  . Hypertension    6 month follow up    HPI  Here for f/u of BP.  C/o some elevated BPs at MD offices, but normal BPs at home.   C/o some dizziness with some sinus congestion.  Feels tired with much activity. No problem-specific assessment & plan notes found for this encounter.   Past Medical History  Diagnosis Date  . Thrombocytopenia ([place])   . Cataracts, bilateral   . H/O carpal tunnel syndrome   . Arthritis   . DDD (degenerative disc disease), lumbar   . Glaucoma   . History of osteomyelitis 1953    right leg  . Benign prostate hyperplasia     lower urinary tract symptoms    Social History  Substance Use Topics  . Smoking status: Former Smoker -- 1.00 packs/day for 30 years    Types: Cigarettes  . Smokeless tobacco: Former Systems developer     Comment: stopped at age 86; started in teens  . Alcohol Use: 0.0 oz/week    0 Standard drinks or equivalent per week     Comment: wine     Current outpatient prescriptions:  .  aspirin 81 MG tablet, Take 1 tablet by mouth daily., Disp: , Rfl:  .  Calcium Carb-Cholecalciferol (CALCIUM 600 + D) 600-200 MG-UNIT TABS, Take 1 tablet by mouth daily., Disp: , Rfl:  .  timolol (TIMOPTIC) 0.5 % ophthalmic solution, 1 drop 2 (two) times daily., Disp: , Rfl:   No Known Allergies  Review of Systems  Constitutional: Positive for malaise/fatigue. Negative for fever, chills and weight loss.  HENT: Positive for congestion. Negative for hearing loss.   Eyes: Negative for blurred vision and double vision.  Respiratory: Negative for cough, shortness of breath and wheezing.   Cardiovascular: Negative for chest pain, palpitations and leg swelling.  Gastrointestinal: Negative for heartburn, abdominal pain and constipation.  Genitourinary: Negative for dysuria, urgency and frequency.   Skin: Negative for rash.  Neurological: Positive for dizziness. Negative for weakness and headaches.      Objective  Filed Vitals:   02/13/15 1327  BP: 163/93  Pulse: 71  Temp: 97.7 F (36.5 C)  TempSrc: Oral  Resp: 16  Height: 5\' 11"  (1.803 m)  Weight: 220 lb (99.791 kg)     Physical Exam  Constitutional: He is oriented to person, place, and time and well-developed, well-nourished, and in no distress. No distress.  HENT:  Head: Normocephalic.  Eyes: Conjunctivae and EOM are normal. Pupils are equal, round, and reactive to light. No scleral icterus.  Neck: Normal range of motion. Neck supple. Carotid bruit is not present. No thyromegaly present.  Cardiovascular: Normal rate, regular rhythm, normal heart sounds and intact distal pulses.  Exam reveals no gallop and no friction rub.   No murmur heard. Pulmonary/Chest: Effort normal and breath sounds normal. No respiratory distress. He has no wheezes. He has no rales.  Abdominal: Soft. Bowel sounds are normal. He exhibits no distension, no abdominal bruit and no mass. There is no tenderness.  Musculoskeletal: He exhibits no edema.  Lymphadenopathy:    He has no cervical adenopathy.  Neurological: He is alert and oriented to person, place, and time.  Vitals reviewed.     Recent Results (from the past 2160 hour(s))  CBC with Differential  Status: Abnormal   Collection Time: 02/03/15  9:01 AM  Result Value Ref Range   WBC 5.9 3.8 - 10.6 K/uL   RBC 4.48 4.40 - 5.90 MIL/uL   Hemoglobin 14.6 13.0 - 18.0 g/dL   HCT 42.9 40.0 - 52.0 %   MCV 95.7 80.0 - 100.0 fL   MCH 32.6 26.0 - 34.0 pg   MCHC 34.1 32.0 - 36.0 g/dL   RDW 14.7 (H) 11.5 - 14.5 %   Platelets 97 (L) 150 - 440 K/uL   Neutrophils Relative % 61 %   Neutro Abs 3.6 1.4 - 6.5 K/uL   Lymphocytes Relative 29 %   Lymphs Abs 1.7 1.0 - 3.6 K/uL   Monocytes Relative 8 %   Monocytes Absolute 0.4 0.2 - 1.0 K/uL   Eosinophils Relative 2 %   Eosinophils Absolute 0.1  0 - 0.7 K/uL   Basophils Relative 0 %   Basophils Absolute 0.0 0 - 0.1 K/uL     Assessment & Plan  1. Essential hypertension  - lisinopril (PRINIVIL,ZESTRIL) 5 MG tablet; Take 1 tablet (5 mg total) by mouth daily.  Dispense: 90 tablet; Refill: 3  2. Chronic fatigue  - Comprehensive Metabolic Panel (CMET) - CBC with Differential - TSH  3. Seasonal allergies  - loratadine (CLARITIN) 10 MG tablet; Take 1 tablet (10 mg total) by mouth daily.  Dispense: 30 tablet; Refill: 11

## 2015-02-16 DIAGNOSIS — R5382 Chronic fatigue, unspecified: Secondary | ICD-10-CM | POA: Diagnosis not present

## 2015-02-17 LAB — COMPREHENSIVE METABOLIC PANEL
ALK PHOS: 61 IU/L (ref 39–117)
ALT: 21 IU/L (ref 0–44)
AST: 24 IU/L (ref 0–40)
Albumin/Globulin Ratio: 1.5 (ref 1.1–2.5)
Albumin: 3.8 g/dL (ref 3.5–4.7)
BUN/Creatinine Ratio: 14 (ref 10–22)
BUN: 12 mg/dL (ref 8–27)
Bilirubin Total: 0.8 mg/dL (ref 0.0–1.2)
CO2: 22 mmol/L (ref 18–29)
CREATININE: 0.83 mg/dL (ref 0.76–1.27)
Calcium: 9.1 mg/dL (ref 8.6–10.2)
Chloride: 103 mmol/L (ref 97–106)
GFR calc Af Amer: 94 mL/min/{1.73_m2} (ref >59)
GFR, EST NON AFRICAN AMERICAN: 81 mL/min/{1.73_m2} (ref >59)
GLUCOSE: 99 mg/dL (ref 65–99)
Globulin, Total: 2.5 g/dL (ref 1.5–4.5)
Potassium: 4.4 mmol/L (ref 3.5–5.2)
SODIUM: 139 mmol/L (ref 136–144)
Total Protein: 6.3 g/dL (ref 6.0–8.5)

## 2015-02-17 LAB — CBC WITH DIFFERENTIAL/PLATELET
Basophils Absolute: 0 10*3/uL (ref 0.0–0.2)
Basos: 0 %
EOS (ABSOLUTE): 0.2 10*3/uL (ref 0.0–0.4)
EOS: 3 %
HEMOGLOBIN: 14.1 g/dL (ref 12.6–17.7)
Hematocrit: 41.4 % (ref 37.5–51.0)
IMMATURE GRANS (ABS): 0 10*3/uL (ref 0.0–0.1)
IMMATURE GRANULOCYTES: 1 %
LYMPHS: 28 %
Lymphocytes Absolute: 1.7 10*3/uL (ref 0.7–3.1)
MCH: 32.6 pg (ref 26.6–33.0)
MCHC: 34.1 g/dL (ref 31.5–35.7)
MCV: 96 fL (ref 79–97)
MONOCYTES: 7 %
MONOS ABS: 0.4 10*3/uL (ref 0.1–0.9)
NEUTROS PCT: 61 %
Neutrophils Absolute: 3.7 10*3/uL (ref 1.4–7.0)
PLATELETS: 102 10*3/uL — AB (ref 150–379)
RBC: 4.33 x10E6/uL (ref 4.14–5.80)
RDW: 14.6 % (ref 12.3–15.4)
WBC: 6.1 10*3/uL (ref 3.4–10.8)

## 2015-02-17 LAB — TSH: TSH: 3.47 u[IU]/mL (ref 0.450–4.500)

## 2015-02-17 NOTE — Progress Notes (Signed)
Advised.JH  

## 2015-04-04 ENCOUNTER — Telehealth: Payer: Self-pay | Admitting: Family Medicine

## 2015-04-04 DIAGNOSIS — H409 Unspecified glaucoma: Secondary | ICD-10-CM

## 2015-04-04 NOTE — Telephone Encounter (Signed)
Pt needs a referral to [facility] for eye exam and letter to get glaucoma drops for [facility].  His call back number is [phone]

## 2015-04-04 NOTE — Telephone Encounter (Signed)
Patient has been getting eyecare from the [place].  Patient has appt here 05/18/2015. Patient would like a referral to [facility] for eye exam and a letter for glaucoma drops. I told patient eye doctor could take care of letter for eye drops after he is seen.

## 2015-04-04 NOTE — Telephone Encounter (Signed)
OK to go ahead and make referral to [facility] for glaucoma.-jh

## 2015-04-10 ENCOUNTER — Encounter: Payer: Self-pay | Admitting: *Deleted

## 2015-05-18 ENCOUNTER — Encounter: Payer: Self-pay | Admitting: Family Medicine

## 2015-05-18 ENCOUNTER — Ambulatory Visit (INDEPENDENT_AMBULATORY_CARE_PROVIDER_SITE_OTHER): Payer: Medicare Other | Admitting: Family Medicine

## 2015-05-18 VITALS — BP 136/82 | HR 75 | Temp 98.0°F | Resp 16 | Ht 71.0 in | Wt 220.0 lb

## 2015-05-18 DIAGNOSIS — D696 Thrombocytopenia, unspecified: Secondary | ICD-10-CM | POA: Diagnosis not present

## 2015-05-18 DIAGNOSIS — I10 Essential (primary) hypertension: Secondary | ICD-10-CM | POA: Diagnosis not present

## 2015-05-18 DIAGNOSIS — M545 Low back pain, unspecified: Secondary | ICD-10-CM | POA: Insufficient documentation

## 2015-05-18 DIAGNOSIS — M549 Dorsalgia, unspecified: Secondary | ICD-10-CM

## 2015-05-18 DIAGNOSIS — G8929 Other chronic pain: Secondary | ICD-10-CM | POA: Diagnosis not present

## 2015-05-18 DIAGNOSIS — R5382 Chronic fatigue, unspecified: Secondary | ICD-10-CM

## 2015-05-18 MED ORDER — CELECOXIB 100 MG PO CAPS: ORAL_CAPSULE | ORAL | Status: DC

## 2015-05-18 NOTE — Progress Notes (Signed)
Name: Raymond Barnett   MRN: [id number]    DOB: 1930/03/19   Date:05/18/2015       Progress Note  Subjective  Chief Complaint  Chief Complaint  Patient presents with  . Hypertension  . Fatigue    HPI Here for f/u of HBP.  Still c/o some fatigue.  Says that his back pain makes him tired.  He has been taking low dose of Naproxyn on and off and that seems to help[.  Sees Hematology re: low platelets, but they are some better now.  No problem-specific assessment & plan notes found for this encounter.   Past Medical History  Diagnosis Date  . Thrombocytopenia ([place])   . Cataracts, bilateral   . H/O carpal tunnel syndrome   . Arthritis   . DDD (degenerative disc disease), lumbar   . Glaucoma   . History of osteomyelitis 1953    right leg  . Benign prostate hyperplasia     lower urinary tract symptoms    Past Surgical History  Procedure Laterality Date  . Leg surgery Right 1953  . Carpal tunnel release Left   . Carpal tunnel release Right   . Total knee arthroplasty Right   . Colonoscopy  2015    Dr. Allen Norris    Family History  Problem Relation Age of Onset  . Colon cancer Mother 44  . Thrombosis Father 60  . Heart disease Father   . Hypertension Sister   . Hyperlipidemia Sister     Social History   Social History  . Marital Status: Married    Spouse Name: N/A  . Number of Children: N/A  . Years of Education: N/A   Occupational History  . Not on file.   Social History Main Topics  . Smoking status: Former Smoker -- 1.00 packs/day for 30 years    Types: Cigarettes  . Smokeless tobacco: Former Systems developer     Comment: stopped at age 66; started in teens  . Alcohol Use: 0.0 oz/week    0 Standard drinks or equivalent per week     Comment: wine  . Drug Use: No  . Sexual Activity: Not Currently   Other Topics Concern  . Not on file   Social History Narrative     Current outpatient prescriptions:  .  aspirin 81 MG tablet, Take 1 tablet by mouth daily., Disp: ,  Rfl:  .  Calcium Carb-Cholecalciferol (CALCIUM 600 + D) 600-200 MG-UNIT TABS, Take 1 tablet by mouth daily., Disp: , Rfl:  .  celecoxib (CELEBREX) 100 MG capsule, Take 1-2 tablets daily  For back pain, Disp: 60 capsule, Rfl: 6 .  lisinopril (PRINIVIL,ZESTRIL) 5 MG tablet, Take 1 tablet (5 mg total) by mouth daily., Disp: 90 tablet, Rfl: 3 .  loratadine (CLARITIN) 10 MG tablet, Take 1 tablet (10 mg total) by mouth daily., Disp: 30 tablet, Rfl: 11 .  timolol (TIMOPTIC) 0.5 % ophthalmic solution, 1 drop 2 (two) times daily., Disp: , Rfl:   Not on File   Review of Systems  Constitutional: Positive for malaise/fatigue. Negative for fever, chills and weight loss.  HENT: Negative for hearing loss.   Eyes: Positive for blurred vision. Negative for double vision.  Respiratory: Negative for cough, shortness of breath and wheezing.   Cardiovascular: Negative for chest pain, palpitations and leg swelling.  Gastrointestinal: Negative for heartburn, abdominal pain and blood in stool.  Genitourinary: Negative for dysuria, urgency and frequency.  Musculoskeletal: Positive for back pain and joint pain.  Skin:  Negative for rash.  Neurological: Positive for weakness. Negative for dizziness, tremors and headaches.      Objective  Filed Vitals:   05/18/15 1326  BP: 136/82  Pulse: 75  Temp: 98 F (36.7 C)  TempSrc: Oral  Resp: 16  Height: 5\' 11"  (1.803 m)  Weight: 220 lb (99.791 kg)    Physical Exam  Constitutional: He is oriented to person, place, and time and well-developed, well-nourished, and in no distress. No distress.  HENT:  Head: Normocephalic and atraumatic.  Eyes: Conjunctivae and EOM are normal. Pupils are equal, round, and reactive to light. No scleral icterus.  Neck: Normal range of motion. Neck supple. Carotid bruit is not present. No thyromegaly present.  Cardiovascular: Normal rate, regular rhythm and normal heart sounds.  Exam reveals no gallop and no friction rub.   No  murmur heard. Pulmonary/Chest: Effort normal and breath sounds normal. No respiratory distress. He has no wheezes. He has no rales.  Abdominal: Soft. Bowel sounds are normal. He exhibits no distension and no mass. There is no tenderness.  Musculoskeletal: He exhibits edema (trace bilateral pedal edema.).  Some back stiffness and pain with  ROM.  Lymphadenopathy:    He has no cervical adenopathy.  Neurological: He is alert and oriented to person, place, and time.  Vitals reviewed.      No results found for this or any previous visit (from the past 2160 hour(s)).   Assessment & Plan  Problem List Items Addressed This Visit      Cardiovascular and Mediastinum   Essential hypertension - Primary     Other   Thrombocytopenia ([facility])   Chronic fatigue   Chronic back pain   Relevant Medications   celecoxib (CELEBREX) 100 MG capsule      Meds ordered this encounter  Medications  . celecoxib (CELEBREX) 100 MG capsule    Sig: Take 1-2 tablets daily  For back pain    Dispense:  60 capsule    Refill:  6   1. Essential hypertension Cont. Lisinopril  2. Thrombocytopenia ([place]) Cont to see Hematology  3. Chronic fatigue   4. Chronic back pain  - celecoxib (CELEBREX) 100 MG capsule; Take 1-2 tablets daily  For back pain  Dispense: 60 capsule; Refill: 6

## 2015-06-07 DIAGNOSIS — H401134 Primary open-angle glaucoma, bilateral, indeterminate stage: Secondary | ICD-10-CM | POA: Diagnosis not present

## 2015-07-07 ENCOUNTER — Telehealth: Payer: Self-pay | Admitting: Gastroenterology

## 2015-07-07 NOTE — Telephone Encounter (Signed)
Patient would like a refill on Lidocaine HCI 2% He now uses [facility] on [address] in [place]

## 2015-07-10 ENCOUNTER — Other Ambulatory Visit: Payer: Self-pay

## 2015-07-10 DIAGNOSIS — K649 Unspecified hemorrhoids: Secondary | ICD-10-CM

## 2015-07-10 MED ORDER — LIDOCAINE-HYDROCORTISONE ACE 2-2 % RE KIT: 1.0000 | PACK | Freq: Two times a day (BID) | RECTAL | Status: DC

## 2015-07-10 NOTE — Telephone Encounter (Signed)
Pt notified rx was sent to [facility].

## 2015-08-04 ENCOUNTER — Inpatient Hospital Stay (HOSPITAL_BASED_OUTPATIENT_CLINIC_OR_DEPARTMENT_OTHER): Payer: Medicare Other | Admitting: Internal Medicine

## 2015-08-04 ENCOUNTER — Inpatient Hospital Stay: Payer: Medicare Other | Attending: Internal Medicine

## 2015-08-04 VITALS — BP 121/76 | HR 63 | Temp 96.8°F | Resp 18 | Wt 215.2 lb

## 2015-08-04 DIAGNOSIS — Z79899 Other long term (current) drug therapy: Secondary | ICD-10-CM

## 2015-08-04 DIAGNOSIS — M5136 Other intervertebral disc degeneration, lumbar region: Secondary | ICD-10-CM

## 2015-08-04 DIAGNOSIS — N400 Enlarged prostate without lower urinary tract symptoms: Secondary | ICD-10-CM

## 2015-08-04 DIAGNOSIS — Z800 Family history of malignant neoplasm of digestive organs: Secondary | ICD-10-CM

## 2015-08-04 DIAGNOSIS — D696 Thrombocytopenia, unspecified: Secondary | ICD-10-CM | POA: Insufficient documentation

## 2015-08-04 DIAGNOSIS — Z7982 Long term (current) use of aspirin: Secondary | ICD-10-CM

## 2015-08-04 DIAGNOSIS — M549 Dorsalgia, unspecified: Secondary | ICD-10-CM

## 2015-08-04 DIAGNOSIS — Z87898 Personal history of other specified conditions: Secondary | ICD-10-CM | POA: Diagnosis not present

## 2015-08-04 DIAGNOSIS — Z87891 Personal history of nicotine dependence: Secondary | ICD-10-CM

## 2015-08-04 LAB — CBC WITH DIFFERENTIAL/PLATELET
Basophils Absolute: 0.1 10*3/uL (ref 0–0.1)
Basophils Relative: 2 %
Eosinophils Absolute: 0.1 10*3/uL (ref 0–0.7)
Eosinophils Relative: 2 %
HEMATOCRIT: 40.3 % (ref 40.0–52.0)
HEMOGLOBIN: 13.8 g/dL (ref 13.0–18.0)
LYMPHS ABS: 1.2 10*3/uL (ref 1.0–3.6)
Lymphocytes Relative: 21 %
MCH: 32.9 pg (ref 26.0–34.0)
MCHC: 34.3 g/dL (ref 32.0–36.0)
MCV: 96 fL (ref 80.0–100.0)
MONOS PCT: 7 %
Monocytes Absolute: 0.4 10*3/uL (ref 0.2–1.0)
NEUTROS ABS: 3.9 10*3/uL (ref 1.4–6.5)
Neutrophils Relative %: 68 %
Platelets: 92 10*3/uL — ABNORMAL LOW (ref 150–440)
RBC: 4.2 MIL/uL — AB (ref 4.40–5.90)
RDW: 14.3 % (ref 11.5–14.5)
WBC: 5.7 10*3/uL (ref 3.8–10.6)

## 2015-08-04 NOTE — Progress Notes (Signed)
Patient ambulates without assistance, denies pain or discomfort.  Vitals documented, medication record updated, information provided by patient.

## 2015-08-04 NOTE — Progress Notes (Signed)
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   He continues to deny any  gum bleeding; denies any unusual bleeding from anywhere else. Denies any skin rash. He has chronic back pain for which he has been taking naproxen. Denies any worsening bleeding issues.  Denies any weight loss. Denies any night sweats. Denies any nausea vomiting chest pain. Has chronic fatigue not any worse.  REVIEW OF SYSTEMS:  A complete 10 point review of system is done which is negative except mentioned above/history of present illness.   PAST MEDICAL HISTORY :  Past Medical History  Diagnosis Date  . Thrombocytopenia ([facility])   . Cataracts, bilateral   . H/O carpal tunnel syndrome   . Arthritis   . DDD (degenerative disc disease), lumbar   . Glaucoma   . History of osteomyelitis 1953    right leg  . Benign prostate hyperplasia     lower urinary tract symptoms    PAST SURGICAL HISTORY :   Past Surgical History  Procedure Laterality Date  . Leg surgery Right 1953  . Carpal tunnel release Left   . Carpal tunnel release Right   . Total knee arthroplasty Right   . Colonoscopy  2015    Dr. Wohl    FAMILY HISTORY :   Family History  Problem Relation Age of Onset  . Colon cancer Mother 51  . Thrombosis Father 64  . Heart disease Father   . Hypertension Sister   . Hyperlipidemia Sister     SOCIAL HISTORY:   Social History  Substance Use Topics  . Smoking status: Former Smoker -- 1.00 packs/day for 30 years    Types: Cigarettes  . Smokeless tobacco: Former User     Comment: stopped at age 54; started in teens  . Alcohol Use: 0.0 oz/week    0 Standard drinks or  equivalent per week     Comment: wine    ALLERGIES:  has no allergies on file.  MEDICATIONS:  Current Outpatient Prescriptions  Medication Sig Dispense Refill  . aspirin 81 MG tablet Take 1 tablet by mouth daily.    . Calcium Carb-Cholecalciferol (CALCIUM 600 + D) 600-200 MG-UNIT TABS Take 1 tablet by mouth daily.    . Lidocaine-Hydrocortisone Ace 2-2 % KIT Place 1 kit rectally 2 (two) times daily. 60 each 3  . lisinopril (PRINIVIL,ZESTRIL) 5 MG tablet Take 1 tablet (5 mg total) by mouth daily. 90 tablet 3  . loratadine (CLARITIN) 10 MG tablet Take 1 tablet (10 mg total) by mouth daily. 30 tablet 11  . timolol (TIMOPTIC) 0.5 % ophthalmic solution 1 drop 2 (two) times daily.    . celecoxib (CELEBREX) 100 MG capsule Take 1-2 tablets daily  For back pain (Patient not taking: Reported on 08/04/2015) 60 capsule 6   No current facility-administered medications for this visit.    PHYSICAL EXAMINATION: ECOG PERFORMANCE STATUS: 0 - Asymptomatic  BP 121/76 mmHg  Pulse 63  Temp(Src) 96.8 F (36 C) (Tympanic)  Wt 215 lb 2.7 oz (97.6 kg)  Filed Weights   08/04/15 1039  Weight: 215 lb 2.7 oz (97.6 kg)    GENERAL: Well-nourished well-developed; Alert,   no distress and comfortable.   He is alone. He walks with a cane. EYES: no pallor or icterus OROPHARYNX: no thrush or ulceration; good dentition  NECK: supple, no masses felt LYMPH:  no palpable lymphadenopathy in the cervical, axillary or inguinal regions LUNGS: clear to auscultation and  No wheeze or crackles HEART/CVS: regular rate & rhythm and no murmurs; No lower extremity edema ABDOMEN:abdomen soft, non-tender and normal bowel sounds Musculoskeletal:no cyanosis of digits and no clubbing  PSYCH: alert & oriented x 3 with fluent speech NEURO: no focal motor/sensory deficits SKIN:  no rashes or significant lesions  LABORATORY DATA:  I have reviewed the data as listed    Component Value Date/Time   NA 139 02/16/2015 1001   K 4.4  02/16/2015 1001   CL 103 02/16/2015 1001   CO2 22 02/16/2015 1001   GLUCOSE 99 02/16/2015 1001   BUN 12 02/16/2015 1001   CREATININE 0.83 02/16/2015 1001   CALCIUM 9.1 02/16/2015 1001   PROT 6.3 02/16/2015 1001   ALBUMIN 3.8 02/16/2015 1001   AST 24 02/16/2015 1001   ALT 21 02/16/2015 1001   ALKPHOS 61 02/16/2015 1001   BILITOT 0.8 02/16/2015 1001   GFRNONAA 81 02/16/2015 1001   GFRAA 94 02/16/2015 1001    No results found for: SPEP, UPEP  Lab Results  Component Value Date   WBC 5.7 08/04/2015   NEUTROABS 3.9 08/04/2015   HGB 13.8 08/04/2015   HCT 40.3 08/04/2015   MCV 96.0 08/04/2015   PLT 92* 08/04/2015      Chemistry      Component Value Date/Time   NA 139 02/16/2015 1001   K 4.4 02/16/2015 1001   CL 103 02/16/2015 1001   CO2 22 02/16/2015 1001   BUN 12 02/16/2015 1001   CREATININE 0.83 02/16/2015 1001      Component Value Date/Time   CALCIUM 9.1 02/16/2015 1001   ALKPHOS 61 02/16/2015 1001   AST 24 02/16/2015 1001   ALT 21 02/16/2015 1001   BILITOT 0.8 02/16/2015 1001        ASSESSMENT & PLAN:   # Chronic low platelets [~90s to 100] stable at least since 2006. The etiology is unclear- ITP versus MDS [never had bone marrow biopsy]. 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  Patient continues to be asymptomatic without any bleeding episodes. I would recommend continued surveillance.  # Today's platelet count is 92; hemoglobin white count is normal. Patient is okay to take NSAID from hematology standpoint as long as platelets are above 50,000.  # Patient follow-up with us in 6 months with labs.      R , MD 08/04/2015 11:18 AM  

## 2015-11-23 ENCOUNTER — Ambulatory Visit (INDEPENDENT_AMBULATORY_CARE_PROVIDER_SITE_OTHER): Payer: Medicare Other | Admitting: Family Medicine

## 2015-11-23 ENCOUNTER — Encounter: Payer: Self-pay | Admitting: Family Medicine

## 2015-11-23 VITALS — BP 130/70 | HR 69 | Temp 98.1°F | Resp 16 | Ht 71.0 in | Wt 219.0 lb

## 2015-11-23 DIAGNOSIS — G8929 Other chronic pain: Secondary | ICD-10-CM | POA: Diagnosis not present

## 2015-11-23 DIAGNOSIS — M549 Dorsalgia, unspecified: Secondary | ICD-10-CM

## 2015-11-23 DIAGNOSIS — R5382 Chronic fatigue, unspecified: Secondary | ICD-10-CM

## 2015-11-23 DIAGNOSIS — I10 Essential (primary) hypertension: Secondary | ICD-10-CM | POA: Diagnosis not present

## 2015-11-23 MED ORDER — LISINOPRIL 2.5 MG PO TABS: 2.5000 mg | ORAL_TABLET | Freq: Every day | ORAL | 6 refills | Status: DC

## 2015-11-23 NOTE — Progress Notes (Signed)
Name: Raymond Barnett   MRN: [id number]    DOB: [birth year]   Date:11/23/2015       Progress Note  Subjective  Chief Complaint  Chief Complaint  Patient presents with  . Hypertension    HPI Here for f/u of HBP.  He feels sluggish occ..  BPs are in 120 range.  He is feeling well overall.  No problem-specific Assessment & Plan notes found for this encounter.   Past Medical History:  Diagnosis Date  . Arthritis   . Benign prostate hyperplasia    lower urinary tract symptoms  . Cataracts, bilateral   . DDD (degenerative disc disease), lumbar   . Glaucoma   . H/O carpal tunnel syndrome   . History of osteomyelitis 1953   right leg  . Thrombocytopenia ([place])     Past Surgical History:  Procedure Laterality Date  . CARPAL TUNNEL RELEASE Left   . CARPAL TUNNEL RELEASE Right   . COLONOSCOPY  2015   Dr. Allen Norris  . LEG SURGERY Right 1953  . TOTAL KNEE ARTHROPLASTY Right     Family History  Problem Relation Age of Onset  . Colon cancer Mother 32  . Thrombosis Father 6  . Heart disease Father   . Hypertension Sister   . Hyperlipidemia Sister     Social History   Social History  . Marital status: Married    Spouse name: N/A  . Number of children: N/A  . Years of education: N/A   Occupational History  . Not on file.   Social History Main Topics  . Smoking status: Former Smoker    Packs/day: 1.00    Years: 30.00    Types: Cigarettes  . Smokeless tobacco: Former Systems developer     Comment: stopped at age 72; started in teens  . Alcohol use 0.0 oz/week     Comment: wine  . Drug use: No  . Sexual activity: Not Currently   Other Topics Concern  . Not on file   Social History Narrative  . No narrative on file     Current Outpatient Prescriptions:  .  aspirin 81 MG tablet, Take 1 tablet by mouth daily., Disp: , Rfl:  .  Calcium Carb-Cholecalciferol (CALCIUM 600 + D) 600-200 MG-UNIT TABS, Take 1 tablet by mouth daily., Disp: , Rfl:  .  finasteride (PROSCAR) 5 MG tablet,  Take 5 mg by mouth daily., Disp: , Rfl:  .  Lidocaine-Hydrocortisone Ace 2-2 % KIT, Place 1 kit rectally 2 (two) times daily., Disp: 60 each, Rfl: 3 .  lisinopril (PRINIVIL,ZESTRIL) 2.5 MG tablet, Take 1 tablet (2.5 mg total) by mouth daily., Disp: 30 tablet, Rfl: 6 .  loratadine (CLARITIN) 10 MG tablet, Take 1 tablet (10 mg total) by mouth daily., Disp: 30 tablet, Rfl: 11 .  naproxen (NAPROSYN) 500 MG tablet, Take 500 mg by mouth daily as needed., Disp: , Rfl:  .  timolol (TIMOPTIC) 0.5 % ophthalmic solution, 1 drop 2 (two) times daily., Disp: , Rfl:   Not on File   Review of Systems  Constitutional: Negative for chills, fever, malaise/fatigue and weight loss.  HENT: Negative for hearing loss.   Eyes: Negative for blurred vision and double vision.  Respiratory: Negative for cough, shortness of breath and wheezing.   Cardiovascular: Negative for chest pain, palpitations and leg swelling.  Gastrointestinal: Negative for abdominal pain, blood in stool and heartburn.  Genitourinary: Negative for dysuria, frequency and urgency.  Skin: Negative for rash.  Neurological: Negative for dizziness,  tremors, weakness and headaches.      Objective  Vitals:   11/23/15 1319 11/23/15 1359  BP: 120/76 130/70  Pulse: 69   Resp: 16   Temp: 98.1 F (36.7 C)   TempSrc: Oral   Weight: 219 lb (99.3 kg)   Height: '5\' 11"'  (1.803 m)     Physical Exam  Constitutional: He is oriented to person, place, and time and well-developed, well-nourished, and in no distress. No distress.  HENT:  Head: Normocephalic and atraumatic.  Eyes: Conjunctivae and EOM are normal. Pupils are equal, round, and reactive to light. No scleral icterus.  Neck: Normal range of motion. Neck supple. No thyromegaly present.  Cardiovascular: Normal rate, regular rhythm and normal heart sounds.  Exam reveals no gallop and no friction rub.   No murmur heard. Pulmonary/Chest: Effort normal and breath sounds normal. No respiratory  distress. He has no wheezes. He has no rales.  Musculoskeletal: He exhibits edema.  Lymphadenopathy:    He has no cervical adenopathy.  Neurological: He is alert and oriented to person, place, and time.  Vitals reviewed.      No results found for this or any previous visit (from the past 2160 hour(s)).   Assessment & Plan  Problem List Items Addressed This Visit      Cardiovascular and Mediastinum   Essential hypertension - Primary   Relevant Medications   lisinopril (PRINIVIL,ZESTRIL) 2.5 MG tablet     Other   Chronic fatigue   Chronic back pain   Relevant Medications   naproxen (NAPROSYN) 500 MG tablet    Other Visit Diagnoses   None.     Meds ordered this encounter  Medications  . finasteride (PROSCAR) 5 MG tablet    Sig: Take 5 mg by mouth daily.  . naproxen (NAPROSYN) 500 MG tablet    Sig: Take 500 mg by mouth daily as needed.  Marland Kitchen lisinopril (PRINIVIL,ZESTRIL) 2.5 MG tablet    Sig: Take 1 tablet (2.5 mg total) by mouth daily.    Dispense:  30 tablet    Refill:  6   1. Essential hypertension  - lisinopril (PRINIVIL,ZESTRIL) 2.5 MG tablet; Take 1 tablet (2.5 mg total) by mouth daily.  Dispense: 30 tablet; Refill: 6  2. Chronic fatigue   3. Chronic back pain

## 2016-01-23 ENCOUNTER — Ambulatory Visit (INDEPENDENT_AMBULATORY_CARE_PROVIDER_SITE_OTHER): Payer: Medicare Other | Admitting: Family Medicine

## 2016-01-23 ENCOUNTER — Encounter: Payer: Self-pay | Admitting: Family Medicine

## 2016-01-23 VITALS — BP 140/70 | HR 71 | Temp 98.6°F | Resp 16 | Ht 70.0 in | Wt 220.0 lb

## 2016-01-23 DIAGNOSIS — G609 Hereditary and idiopathic neuropathy, unspecified: Secondary | ICD-10-CM | POA: Diagnosis not present

## 2016-01-23 DIAGNOSIS — M5440 Lumbago with sciatica, unspecified side: Secondary | ICD-10-CM | POA: Diagnosis not present

## 2016-01-23 DIAGNOSIS — Z23 Encounter for immunization: Secondary | ICD-10-CM | POA: Diagnosis not present

## 2016-01-23 DIAGNOSIS — G8929 Other chronic pain: Secondary | ICD-10-CM | POA: Diagnosis not present

## 2016-01-23 DIAGNOSIS — G629 Polyneuropathy, unspecified: Secondary | ICD-10-CM | POA: Insufficient documentation

## 2016-01-23 DIAGNOSIS — I10 Essential (primary) hypertension: Secondary | ICD-10-CM | POA: Diagnosis not present

## 2016-01-23 DIAGNOSIS — R5382 Chronic fatigue, unspecified: Secondary | ICD-10-CM

## 2016-01-23 MED ORDER — GABAPENTIN 100 MG PO CAPS: 100.0000 mg | ORAL_CAPSULE | Freq: Three times a day (TID) | ORAL | 6 refills | Status: DC

## 2016-01-23 NOTE — Progress Notes (Signed)
Name: Raymond Barnett   MRN: [id number]    DOB: 03-06-1930   Date:01/23/2016       Progress Note  Subjective  Chief Complaint  Chief Complaint  Patient presents with  . Follow-up    Medication     HPI Here for f/u of HBP.  He feels well overall.  Still with some back pain with walking.  Ok with sitting and laying.  Has a numbness and tingling and some burning pain in bilateral feet.  No problem-specific Assessment & Plan notes found for this encounter.   Past Medical History:  Diagnosis Date  . Arthritis   . Benign prostate hyperplasia    lower urinary tract symptoms  . Cataracts, bilateral   . DDD (degenerative disc disease), lumbar   . Glaucoma   . H/O carpal tunnel syndrome   . History of osteomyelitis 1953   right leg  . Thrombocytopenia ([place])     Past Surgical History:  Procedure Laterality Date  . CARPAL TUNNEL RELEASE Left   . CARPAL TUNNEL RELEASE Right   . COLONOSCOPY  2015   Dr. Allen Norris  . LEG SURGERY Right 1953  . TOTAL KNEE ARTHROPLASTY Right     Family History  Problem Relation Age of Onset  . Colon cancer Mother 58  . Thrombosis Father 99  . Heart disease Father   . Hypertension Sister   . Hyperlipidemia Sister     Social History   Social History  . Marital status: Married    Spouse name: N/A  . Number of children: N/A  . Years of education: N/A   Occupational History  . Not on file.   Social History Main Topics  . Smoking status: Former Smoker    Packs/day: 1.00    Years: 30.00    Types: Cigarettes  . Smokeless tobacco: Former Systems developer     Comment: stopped at age 15; started in teens  . Alcohol use 0.0 oz/week     Comment: wine  . Drug use: No  . Sexual activity: Not Currently   Other Topics Concern  . Not on file   Social History Narrative  . No narrative on file     Current Outpatient Prescriptions:  .  aspirin 81 MG tablet, Take 1 tablet by mouth daily., Disp: , Rfl:  .  Calcium Carb-Cholecalciferol (CALCIUM 600 + D)  600-200 MG-UNIT TABS, Take 1 tablet by mouth daily. Patient takes 1000 mg daily., Disp: , Rfl:  .  finasteride (PROSCAR) 5 MG tablet, Take 5 mg by mouth daily., Disp: , Rfl:  .  Lidocaine-Hydrocortisone Ace 2-2 % KIT, Place 1 kit rectally 2 (two) times daily., Disp: 60 each, Rfl: 3 .  lisinopril (PRINIVIL,ZESTRIL) 2.5 MG tablet, Take 1 tablet (2.5 mg total) by mouth daily., Disp: 30 tablet, Rfl: 6 .  loratadine (CLARITIN) 10 MG tablet, Take 1 tablet (10 mg total) by mouth daily., Disp: 30 tablet, Rfl: 11 .  naproxen (NAPROSYN) 500 MG tablet, Take 500 mg by mouth daily as needed., Disp: , Rfl:  .  timolol (TIMOPTIC) 0.5 % ophthalmic solution, 1 drop 2 (two) times daily., Disp: , Rfl:  .  gabapentin (NEURONTIN) 100 MG capsule, Take 1 capsule (100 mg total) by mouth 3 (three) times daily., Disp: 90 capsule, Rfl: 6  No Known Allergies   Review of Systems  Constitutional: Negative for chills, fever, malaise/fatigue and weight loss.  Eyes: Negative for blurred vision and double vision.  Respiratory: Negative for cough, shortness of breath  and wheezing.   Cardiovascular: Negative for chest pain, palpitations and leg swelling.  Gastrointestinal: Negative for abdominal pain, blood in stool and heartburn.  Genitourinary: Negative for dysuria, frequency and urgency.  Musculoskeletal: Positive for back pain.  Skin: Negative for rash.  Neurological: Positive for tingling. Negative for dizziness, tremors, weakness and headaches.      Objective  Vitals:   01/23/16 1318 01/23/16 1405  BP: 121/73 140/70  Pulse: 71   Resp: 16   Temp: 98.6 F (37 C)   TempSrc: Oral   Weight: 99.8 kg (220 lb)   Height: '5\' 10"'  (1.778 m)     Physical Exam  Constitutional: He is oriented to person, place, and time and well-developed, well-nourished, and in no distress. No distress.  HENT:  Head: Normocephalic and atraumatic.  Eyes: Conjunctivae and EOM are normal. Pupils are equal, round, and reactive to light.  No scleral icterus.  Neck: Normal range of motion. Neck supple. Carotid bruit is not present. No thyromegaly present.  Cardiovascular: Normal rate, regular rhythm and normal heart sounds.  Exam reveals no gallop and no friction rub.   No murmur heard. Pulmonary/Chest: Effort normal and breath sounds normal. No respiratory distress. He has no wheezes. He has no rales.  Musculoskeletal: He exhibits no edema.  Lymphadenopathy:    He has no cervical adenopathy.  Neurological: He is alert and oriented to person, place, and time.  Some tingling of bilateral feet.  Vitals reviewed.      No results found for this or any previous visit (from the past 2160 hour(s)).   Assessment & Plan  Problem List Items Addressed This Visit      Cardiovascular and Mediastinum   Essential hypertension     Nervous and Auditory   Peripheral neuropathy (HCC)   Relevant Medications   gabapentin (NEURONTIN) 100 MG capsule     Other   Chronic fatigue   Chronic back pain    Other Visit Diagnoses    Need for vaccination    -  Primary   Relevant Orders   Flu vaccine HIGH DOSE PF (Fluzone High dose) (Completed)      Meds ordered this encounter  Medications  . gabapentin (NEURONTIN) 100 MG capsule    Sig: Take 1 capsule (100 mg total) by mouth 3 (three) times daily.    Dispense:  90 capsule    Refill:  6   1. Need for vaccination  - Flu vaccine HIGH DOSE PF (Fluzone High dose)  2. Essential hypertension  Cont Lisinopril 3. Chronic fatigue   4. Chronic low back pain with sciatica, sciatica laterality unspecified, unspecified back pain laterality   5. Idiopathic peripheral neuropathy  - gabapentin (NEURONTIN) 100 MG capsule; Take 1 capsule (100 mg total) by mouth 3 (three) times daily.  Dispense: 90 capsule; Refill: 6

## 2016-02-02 ENCOUNTER — Inpatient Hospital Stay (HOSPITAL_BASED_OUTPATIENT_CLINIC_OR_DEPARTMENT_OTHER): Payer: Medicare Other | Admitting: Internal Medicine

## 2016-02-02 ENCOUNTER — Inpatient Hospital Stay: Payer: Medicare Other | Attending: Internal Medicine

## 2016-02-02 ENCOUNTER — Encounter: Payer: Self-pay | Admitting: Internal Medicine

## 2016-02-02 DIAGNOSIS — Z87891 Personal history of nicotine dependence: Secondary | ICD-10-CM

## 2016-02-02 DIAGNOSIS — M129 Arthropathy, unspecified: Secondary | ICD-10-CM | POA: Diagnosis not present

## 2016-02-02 DIAGNOSIS — Z800 Family history of malignant neoplasm of digestive organs: Secondary | ICD-10-CM | POA: Insufficient documentation

## 2016-02-02 DIAGNOSIS — N400 Enlarged prostate without lower urinary tract symptoms: Secondary | ICD-10-CM | POA: Insufficient documentation

## 2016-02-02 DIAGNOSIS — M5136 Other intervertebral disc degeneration, lumbar region: Secondary | ICD-10-CM | POA: Diagnosis not present

## 2016-02-02 DIAGNOSIS — Z79899 Other long term (current) drug therapy: Secondary | ICD-10-CM | POA: Insufficient documentation

## 2016-02-02 DIAGNOSIS — D696 Thrombocytopenia, unspecified: Secondary | ICD-10-CM | POA: Insufficient documentation

## 2016-02-02 LAB — COMPREHENSIVE METABOLIC PANEL
ALT: 19 U/L (ref 17–63)
AST: 28 U/L (ref 15–41)
Albumin: 3.9 g/dL (ref 3.5–5.0)
Alkaline Phosphatase: 52 U/L (ref 38–126)
Anion gap: 9 (ref 5–15)
BILIRUBIN TOTAL: 1 mg/dL (ref 0.3–1.2)
BUN: 17 mg/dL (ref 6–20)
CHLORIDE: 103 mmol/L (ref 101–111)
CO2: 23 mmol/L (ref 22–32)
CREATININE: 0.77 mg/dL (ref 0.61–1.24)
Calcium: 9 mg/dL (ref 8.9–10.3)
Glucose, Bld: 116 mg/dL — ABNORMAL HIGH (ref 65–99)
POTASSIUM: 4.2 mmol/L (ref 3.5–5.1)
Sodium: 135 mmol/L (ref 135–145)
TOTAL PROTEIN: 6.8 g/dL (ref 6.5–8.1)

## 2016-02-02 LAB — CBC WITH DIFFERENTIAL/PLATELET
Basophils Absolute: 0 10*3/uL (ref 0–0.1)
Basophils Relative: 0 %
EOS PCT: 2 %
Eosinophils Absolute: 0.1 10*3/uL (ref 0–0.7)
HEMATOCRIT: 39.7 % — AB (ref 40.0–52.0)
Hemoglobin: 13.6 g/dL (ref 13.0–18.0)
LYMPHS ABS: 1.6 10*3/uL (ref 1.0–3.6)
LYMPHS PCT: 26 %
MCH: 33 pg (ref 26.0–34.0)
MCHC: 34.3 g/dL (ref 32.0–36.0)
MCV: 96.2 fL (ref 80.0–100.0)
MONO ABS: 0.6 10*3/uL (ref 0.2–1.0)
MONOS PCT: 10 %
Neutro Abs: 3.8 10*3/uL (ref 1.4–6.5)
Neutrophils Relative %: 62 %
PLATELETS: 99 10*3/uL — AB (ref 150–440)
RBC: 4.12 MIL/uL — ABNORMAL LOW (ref 4.40–5.90)
RDW: 15 % — AB (ref 11.5–14.5)
WBC: 6.1 10*3/uL (ref 3.8–10.6)

## 2016-02-02 NOTE — Progress Notes (Signed)
[facility] OFFICE PROGRESS NOTE  Patient Care Team: Arlis Porta., MD as PCP - General (Family Medicine)   SUMMARY OF ONCOLOGIC HISTORY:  # 2006 CHRONIC LOW THROMBOCYTOPENIA- ITP [clinically more likely] vs MDS [ No BMBx]; [place] 2013- Normal spleen; asymptomatic on surveillance  INTERVAL HISTORY:  A very pleasant 82 year old male patient who looks much younger than his stated age and is here for follow-up.   Denies any easy bruising or bleeding. Denies any weight loss. Denies any night sweats. Denies any nausea vomiting chest pain. Has chronic fatigue not any worse.  REVIEW OF SYSTEMS:  A complete 10 point review of system is done which is negative except mentioned above/history of present illness.   PAST MEDICAL HISTORY :  Past Medical History:  Diagnosis Date  . Arthritis   . Benign prostate hyperplasia    lower urinary tract symptoms  . Cataracts, bilateral   . DDD (degenerative disc disease), lumbar   . Glaucoma   . H/O carpal tunnel syndrome   . History of osteomyelitis 1953   right leg  . Thrombocytopenia ([place])     PAST SURGICAL HISTORY :   Past Surgical History:  Procedure Laterality Date  . CARPAL TUNNEL RELEASE Left   . CARPAL TUNNEL RELEASE Right   . COLONOSCOPY  2015   Dr. Allen Norris  . LEG SURGERY Right 1953  . TOTAL KNEE ARTHROPLASTY Right     FAMILY HISTORY :   Family History  Problem Relation Age of Onset  . Colon cancer Mother 37  . Thrombosis Father 67  . Heart disease Father   . Hypertension Sister   . Hyperlipidemia Sister     SOCIAL HISTORY:   Social History  Substance Use Topics  . Smoking status: Former Smoker    Packs/day: 1.00    Years: 30.00    Types: Cigarettes  . Smokeless tobacco: Former Systems developer     Comment: stopped at age 68; started in teens  . Alcohol use 0.0 oz/week     Comment: wine    ALLERGIES:  has No Known Allergies.  MEDICATIONS:  Current Outpatient Prescriptions  Medication Sig Dispense Refill  .  aspirin 81 MG tablet Take 1 tablet by mouth daily.    . Calcium Carb-Cholecalciferol (CALCIUM 600 + D) 600-200 MG-UNIT TABS Take 1 tablet by mouth daily. Patient takes 1000 mg daily.    . finasteride (PROSCAR) 5 MG tablet Take 5 mg by mouth daily.    Marland Kitchen gabapentin (NEURONTIN) 100 MG capsule Take 1 capsule (100 mg total) by mouth 3 (three) times daily. 90 capsule 6  . Lidocaine-Hydrocortisone Ace 2-2 % KIT Place 1 kit rectally 2 (two) times daily. 60 each 3  . lisinopril (PRINIVIL,ZESTRIL) 2.5 MG tablet Take 1 tablet (2.5 mg total) by mouth daily. 30 tablet 6  . loratadine (CLARITIN) 10 MG tablet Take 1 tablet (10 mg total) by mouth daily. 30 tablet 11  . naproxen (NAPROSYN) 500 MG tablet Take 500 mg by mouth daily as needed.    . timolol (TIMOPTIC) 0.5 % ophthalmic solution 1 drop 2 (two) times daily.     No current facility-administered medications for this visit.     PHYSICAL EXAMINATION: ECOG PERFORMANCE STATUS: 0 - Asymptomatic  BP (!) 151/87 (BP Location: Left Arm, Patient Position: Sitting)   Pulse 70   Temp (!) 93.5 F (34.2 C)   Resp 18   Ht '5\' 10"'  (1.778 m)   Wt 218 lb 12.8 oz (99.2 kg)  BMI 31.39 kg/m   Filed Weights   02/02/16 1047  Weight: 218 lb 12.8 oz (99.2 kg)    GENERAL: Well-nourished well-developed; Alert, no distress and comfortable.   He is alone. He walks with a cane. EYES: no pallor or icterus OROPHARYNX: no thrush or ulceration; good dentition  NECK: supple, no masses felt LYMPH:  no palpable lymphadenopathy in the cervical, axillary or inguinal regions LUNGS: clear to auscultation and  No wheeze or crackles HEART/CVS: regular rate & rhythm and no murmurs; No lower extremity edema ABDOMEN:abdomen soft, non-tender and normal bowel sounds Musculoskeletal:no cyanosis of digits and no clubbing  PSYCH: alert & oriented x 3 with fluent speech NEURO: no focal motor/sensory deficits SKIN:  no rashes or significant lesions  LABORATORY DATA:  I have reviewed  the data as listed    Component Value Date/Time   NA 135 02/02/2016 0955   NA 139 02/16/2015 1001   K 4.2 02/02/2016 0955   CL 103 02/02/2016 0955   CO2 23 02/02/2016 0955   GLUCOSE 116 (H) 02/02/2016 0955   BUN 17 02/02/2016 0955   BUN 12 02/16/2015 1001   CREATININE 0.77 02/02/2016 0955   CALCIUM 9.0 02/02/2016 0955   PROT 6.8 02/02/2016 0955   PROT 6.3 02/16/2015 1001   ALBUMIN 3.9 02/02/2016 0955   ALBUMIN 3.8 02/16/2015 1001   AST 28 02/02/2016 0955   ALT 19 02/02/2016 0955   ALKPHOS 52 02/02/2016 0955   BILITOT 1.0 02/02/2016 0955   BILITOT 0.8 02/16/2015 1001   GFRNONAA >60 02/02/2016 0955   GFRAA >60 02/02/2016 0955    No results found for: SPEP, UPEP  Lab Results  Component Value Date   WBC 6.1 02/02/2016   NEUTROABS 3.8 02/02/2016   HGB 13.6 02/02/2016   HCT 39.7 (L) 02/02/2016   MCV 96.2 02/02/2016   PLT 99 (L) 02/02/2016      Chemistry      Component Value Date/Time   NA 135 02/02/2016 0955   NA 139 02/16/2015 1001   K 4.2 02/02/2016 0955   CL 103 02/02/2016 0955   CO2 23 02/02/2016 0955   BUN 17 02/02/2016 0955   BUN 12 02/16/2015 1001   CREATININE 0.77 02/02/2016 0955      Component Value Date/Time   CALCIUM 9.0 02/02/2016 0955   ALKPHOS 52 02/02/2016 0955   AST 28 02/02/2016 0955   ALT 19 02/02/2016 0955   BILITOT 1.0 02/02/2016 0955   BILITOT 0.8 02/16/2015 1001        ASSESSMENT & PLAN:   Thrombocytopenia ([place]) # Chronic low platelets [~90s to 100] stable at least since 2006. The etiology is unclear- ITP versus MDS [never had bone marrow biopsy]. 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  Today's platelet count is 99; hemoglobin white count is normal.   Patient continues to be asymptomatic without any bleeding episodes- recommend surveillance.  # Patient follow-up with [place] in 6 months with labs/ preference.     Cammie Sickle, MD 02/03/2016 11:26 AM

## 2016-02-02 NOTE — Assessment & Plan Note (Addendum)
#  Chronic low platelets [~90s to 100] stable at least since 2006. The etiology is unclear- ITP versus MDS [never had bone marrow biopsy]. 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  Today's platelet count is 99; hemoglobin white count is normal.   Patient continues to be asymptomatic without any bleeding episodes- recommend surveillance.  # Patient follow-up with [place] in 6 months with labs/ preference.

## 2016-02-05 ENCOUNTER — Other Ambulatory Visit: Payer: Medicare Other

## 2016-02-05 ENCOUNTER — Ambulatory Visit: Payer: Medicare Other | Admitting: Internal Medicine

## 2016-03-03 ENCOUNTER — Other Ambulatory Visit: Payer: Self-pay | Admitting: Family Medicine

## 2016-03-03 DIAGNOSIS — J302 Other seasonal allergic rhinitis: Secondary | ICD-10-CM

## 2016-03-05 ENCOUNTER — Ambulatory Visit (INDEPENDENT_AMBULATORY_CARE_PROVIDER_SITE_OTHER): Payer: Medicare Other | Admitting: Family Medicine

## 2016-03-05 ENCOUNTER — Encounter: Payer: Self-pay | Admitting: Family Medicine

## 2016-03-05 VITALS — BP 140/70 | HR 73 | Temp 98.1°F | Resp 16 | Ht 70.0 in | Wt 220.0 lb

## 2016-03-05 DIAGNOSIS — R739 Hyperglycemia, unspecified: Secondary | ICD-10-CM

## 2016-03-05 DIAGNOSIS — G609 Hereditary and idiopathic neuropathy, unspecified: Secondary | ICD-10-CM | POA: Diagnosis not present

## 2016-03-05 DIAGNOSIS — I10 Essential (primary) hypertension: Secondary | ICD-10-CM

## 2016-03-05 NOTE — Progress Notes (Signed)
Name: Raymond Barnett   MRN: [id number]    DOB: 1930-02-20   Date:03/05/2016       Progress Note  Subjective  Chief Complaint  Chief Complaint  Patient presents with  . Hypertension  . Peripheral Neuropathy    HPI Here for f/u of HBP and peripheral neuropathy. He got lightheaded and somewhat dizzy on 3 Gabapentin a day.  He stopped the Gabapentin.  His BS was found to be somewhat high when checked.    No problem-specific Assessment & Plan notes found for this encounter.   Past Medical History:  Diagnosis Date  . Arthritis   . Benign prostate hyperplasia    lower urinary tract symptoms  . Cataracts, bilateral   . DDD (degenerative disc disease), lumbar   . Glaucoma   . H/O carpal tunnel syndrome   . History of osteomyelitis 1953   right leg  . Thrombocytopenia ([place])     Past Surgical History:  Procedure Laterality Date  . CARPAL TUNNEL RELEASE Left   . CARPAL TUNNEL RELEASE Right   . COLONOSCOPY  2015   Dr. Allen Norris  . LEG SURGERY Right 1953  . TOTAL KNEE ARTHROPLASTY Right     Family History  Problem Relation Age of Onset  . Colon cancer Mother 25  . Thrombosis Father 50  . Heart disease Father   . Hypertension Sister   . Hyperlipidemia Sister     Social History   Social History  . Marital status: Married    Spouse name: N/A  . Number of children: N/A  . Years of education: N/A   Occupational History  . Not on file.   Social History Main Topics  . Smoking status: Former Smoker    Packs/day: 1.00    Years: 30.00    Types: Cigarettes  . Smokeless tobacco: Former Systems developer     Comment: stopped at age 89; started in teens  . Alcohol use 0.0 oz/week     Comment: wine  . Drug use: No  . Sexual activity: Not Currently   Other Topics Concern  . Not on file   Social History Narrative  . No narrative on file     Current Outpatient Prescriptions:  .  aspirin 81 MG tablet, Take 1 tablet by mouth daily., Disp: , Rfl:  .  Calcium Carb-Cholecalciferol  (CALCIUM 600 + D) 600-200 MG-UNIT TABS, Take 1 tablet by mouth daily. Patient takes 1000 mg daily., Disp: , Rfl:  .  finasteride (PROSCAR) 5 MG tablet, Take 5 mg by mouth daily., Disp: , Rfl:  .  Lidocaine-Hydrocortisone Ace 2-2 % KIT, Place 1 kit rectally 2 (two) times daily., Disp: 60 each, Rfl: 3 .  lisinopril (PRINIVIL,ZESTRIL) 2.5 MG tablet, Take 1 tablet (2.5 mg total) by mouth daily., Disp: 30 tablet, Rfl: 6 .  loratadine (CLARITIN) 10 MG tablet, TAKE 1 TABLET BY MOUTH EVERY DAY, Disp: 30 tablet, Rfl: 12 .  naproxen (NAPROSYN) 500 MG tablet, Take 500 mg by mouth daily as needed., Disp: , Rfl:  .  timolol (TIMOPTIC) 0.5 % ophthalmic solution, 1 drop 2 (two) times daily., Disp: , Rfl:   Not on File   Review of Systems  Constitutional: Negative for chills, fever, malaise/fatigue and weight loss.  HENT: Negative for hearing loss and tinnitus.   Eyes: Negative for blurred vision and double vision.  Respiratory: Negative for cough, shortness of breath and wheezing.   Cardiovascular: Negative for chest pain, palpitations and leg swelling.  Gastrointestinal: Negative for abdominal  pain, blood in stool and heartburn.  Genitourinary: Negative for dysuria, frequency and urgency.  Musculoskeletal: Negative for joint pain and myalgias.  Skin: Negative for rash.  Neurological: Positive for tingling, sensory change, focal weakness and weakness. Negative for dizziness and headaches.      Objective  Vitals:   03/05/16 1517 03/05/16 1552  BP: (!) 145/81 140/70  Pulse: 73   Resp: 16   Temp: 98.1 F (36.7 C)   TempSrc: Oral   Weight: 220 lb (99.8 kg)   Height: '5\' 10"'  (1.778 m)     Physical Exam  Constitutional: He is oriented to person, place, and time and well-developed, well-nourished, and in no distress. No distress.  HENT:  Head: Normocephalic and atraumatic.  Eyes: Conjunctivae and EOM are normal. Pupils are equal, round, and reactive to light. No scleral icterus.  Neck: Normal  range of motion. Neck supple. Carotid bruit is not present. No thyromegaly present.  Cardiovascular: Normal rate, regular rhythm and normal heart sounds.  Exam reveals no gallop and no friction rub.   No murmur heard. Pulmonary/Chest: Effort normal and breath sounds normal. No respiratory distress. He has no wheezes. He has no rales.  Musculoskeletal: He exhibits no edema.  Lymphadenopathy:    He has no cervical adenopathy.  Neurological: He is alert and oriented to person, place, and time.  C/o some tingling in feet and some loss of balance from his feet problems  Vitals reviewed.      Recent Results (from the past 2160 hour(s))  CBC with Differential     Status: Abnormal   Collection Time: 02/02/16  9:55 AM  Result Value Ref Range   WBC 6.1 3.8 - 10.6 K/uL   RBC 4.12 (L) 4.40 - 5.90 MIL/uL   Hemoglobin 13.6 13.0 - 18.0 g/dL   HCT 39.7 (L) 40.0 - 52.0 %   MCV 96.2 80.0 - 100.0 fL   MCH 33.0 26.0 - 34.0 pg   MCHC 34.3 32.0 - 36.0 g/dL   RDW 15.0 (H) 11.5 - 14.5 %   Platelets 99 (L) 150 - 440 K/uL   Neutrophils Relative % 62 %   Neutro Abs 3.8 1.4 - 6.5 K/uL   Lymphocytes Relative 26 %   Lymphs Abs 1.6 1.0 - 3.6 K/uL   Monocytes Relative 10 %   Monocytes Absolute 0.6 0.2 - 1.0 K/uL   Eosinophils Relative 2 %   Eosinophils Absolute 0.1 0 - 0.7 K/uL   Basophils Relative 0 %   Basophils Absolute 0.0 0 - 0.1 K/uL  Comprehensive metabolic panel     Status: Abnormal   Collection Time: 02/02/16  9:55 AM  Result Value Ref Range   Sodium 135 135 - 145 mmol/L   Potassium 4.2 3.5 - 5.1 mmol/L   Chloride 103 101 - 111 mmol/L   CO2 23 22 - 32 mmol/L   Glucose, Bld 116 (H) 65 - 99 mg/dL   BUN 17 6 - 20 mg/dL   Creatinine, Ser 0.77 0.61 - 1.24 mg/dL   Calcium 9.0 8.9 - 10.3 mg/dL   Total Protein 6.8 6.5 - 8.1 g/dL   Albumin 3.9 3.5 - 5.0 g/dL   AST 28 15 - 41 U/L   ALT 19 17 - 63 U/L   Alkaline Phosphatase 52 38 - 126 U/L   Total Bilirubin 1.0 0.3 - 1.2 mg/dL   GFR calc non Af  Amer >60 >60 mL/min   GFR calc Af Amer >60 >60 mL/min  Comment: (NOTE) The eGFR has been calculated using the CKD EPI equation. This calculation has not been validated in all clinical situations. eGFR's persistently <60 mL/min signify possible Chronic Kidney Disease.    Anion gap 9 5 - 15     Assessment & Plan  Problem List Items Addressed This Visit      Cardiovascular and Mediastinum   Essential hypertension - Primary     Nervous and Auditory   Peripheral neuropathy (HCC)     Other   Elevated blood sugar   Relevant Orders   HgB A1c      No orders of the defined types were placed in this encounter.  1. Elevated blood sugar  - HgB A1c  2. Essential hypertension Cont Lisinopril  3. Idiopathic peripheral neuropathy Patient has stopped Gabapentin and does not wish to take anything else at present.

## 2016-03-06 ENCOUNTER — Other Ambulatory Visit: Payer: Medicare Other

## 2016-03-06 DIAGNOSIS — R739 Hyperglycemia, unspecified: Secondary | ICD-10-CM | POA: Diagnosis not present

## 2016-03-07 LAB — HEMOGLOBIN A1C
HEMOGLOBIN A1C: 5 % (ref <5.7)
MEAN PLASMA GLUCOSE: 97 mg/dL

## 2016-03-20 DIAGNOSIS — H401131 Primary open-angle glaucoma, bilateral, mild stage: Secondary | ICD-10-CM | POA: Diagnosis not present

## 2016-06-07 ENCOUNTER — Other Ambulatory Visit: Payer: Self-pay | Admitting: Family Medicine

## 2016-06-07 DIAGNOSIS — I10 Essential (primary) hypertension: Secondary | ICD-10-CM

## 2016-07-04 LAB — VITAMIN D 25 HYDROXY (VIT D DEFICIENCY, FRACTURES): VIT D 25 HYDROXY: 34

## 2016-07-04 LAB — LIPID PANEL
Cholesterol, Total: 152
HDL: 50
LDL: 92
Triglycerides: 156 — AB (ref ?–50)

## 2016-07-04 LAB — HEMOGLOBIN A1C: HEMOGLOBIN A1C: 5

## 2016-08-07 ENCOUNTER — Inpatient Hospital Stay: Payer: Medicare Other | Admitting: Oncology

## 2016-08-07 ENCOUNTER — Inpatient Hospital Stay: Payer: Medicare Other

## 2016-08-14 ENCOUNTER — Inpatient Hospital Stay: Payer: Medicare Other

## 2016-08-14 ENCOUNTER — Inpatient Hospital Stay: Payer: Medicare Other | Attending: Oncology | Admitting: Internal Medicine

## 2016-08-14 VITALS — BP 149/92 | HR 84 | Temp 97.0°F | Resp 18 | Ht 70.0 in | Wt 221.0 lb

## 2016-08-14 DIAGNOSIS — Z800 Family history of malignant neoplasm of digestive organs: Secondary | ICD-10-CM | POA: Diagnosis not present

## 2016-08-14 DIAGNOSIS — D696 Thrombocytopenia, unspecified: Secondary | ICD-10-CM | POA: Diagnosis not present

## 2016-08-14 DIAGNOSIS — Z79899 Other long term (current) drug therapy: Secondary | ICD-10-CM

## 2016-08-14 DIAGNOSIS — M5136 Other intervertebral disc degeneration, lumbar region: Secondary | ICD-10-CM | POA: Insufficient documentation

## 2016-08-14 DIAGNOSIS — Z87891 Personal history of nicotine dependence: Secondary | ICD-10-CM | POA: Diagnosis not present

## 2016-08-14 DIAGNOSIS — Z7982 Long term (current) use of aspirin: Secondary | ICD-10-CM | POA: Insufficient documentation

## 2016-08-14 LAB — COMPREHENSIVE METABOLIC PANEL
ALK PHOS: 48 U/L (ref 38–126)
ALT: 18 U/L (ref 17–63)
ANION GAP: 6 (ref 5–15)
AST: 28 U/L (ref 15–41)
Albumin: 3.7 g/dL (ref 3.5–5.0)
BUN: 17 mg/dL (ref 6–20)
CO2: 25 mmol/L (ref 22–32)
Calcium: 9.2 mg/dL (ref 8.9–10.3)
Chloride: 105 mmol/L (ref 101–111)
Creatinine, Ser: 0.8 mg/dL (ref 0.61–1.24)
Glucose, Bld: 109 mg/dL — ABNORMAL HIGH (ref 65–99)
Potassium: 4.3 mmol/L (ref 3.5–5.1)
SODIUM: 136 mmol/L (ref 135–145)
TOTAL PROTEIN: 7 g/dL (ref 6.5–8.1)
Total Bilirubin: 1.1 mg/dL (ref 0.3–1.2)

## 2016-08-14 LAB — CBC WITH DIFFERENTIAL/PLATELET
Basophils Absolute: 0.1 10*3/uL (ref 0–0.1)
Basophils Relative: 1 %
EOS PCT: 3 %
Eosinophils Absolute: 0.1 10*3/uL (ref 0–0.7)
HCT: 38.1 % — ABNORMAL LOW (ref 40.0–52.0)
HEMOGLOBIN: 13.2 g/dL (ref 13.0–18.0)
LYMPHS PCT: 24 %
Lymphs Abs: 1.4 10*3/uL (ref 1.0–3.6)
MCH: 33.3 pg (ref 26.0–34.0)
MCHC: 34.6 g/dL (ref 32.0–36.0)
MCV: 96.2 fL (ref 80.0–100.0)
MONOS PCT: 9 %
Monocytes Absolute: 0.5 10*3/uL (ref 0.2–1.0)
Neutro Abs: 3.6 10*3/uL (ref 1.4–6.5)
Neutrophils Relative %: 63 %
PLATELETS: 101 10*3/uL — AB (ref 150–440)
RBC: 3.96 MIL/uL — AB (ref 4.40–5.90)
RDW: 15.4 % — ABNORMAL HIGH (ref 11.5–14.5)
WBC: 5.7 10*3/uL (ref 3.8–10.6)

## 2016-08-14 NOTE — Progress Notes (Signed)
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   In the interim patient was evaluated at the [place]; notes to have "shadow" question in the neck/chest. He is recommended to have a CT scan. He has not had workup yet he is awaiting to see his primary care physician next month.   Otherwise patient continues to deny  any easy bruising or bleeding. Denies any weight loss. Denies any night sweats. Denies any nausea vomiting chest pain.  REVIEW OF SYSTEMS:  A complete 10 point review of system is done which is negative except mentioned above/history of present illness.   PAST MEDICAL HISTORY :  Past Medical History:  Diagnosis Date  . Arthritis   . Benign prostate hyperplasia    lower urinary tract symptoms  . Cataracts, bilateral   . DDD (degenerative disc disease), lumbar   . Glaucoma   . H/O carpal tunnel syndrome   . History of osteomyelitis 1953   right leg  . Thrombocytopenia ([place])     PAST SURGICAL HISTORY :   Past Surgical History:  Procedure Laterality Date  . CARPAL TUNNEL RELEASE Left   . CARPAL TUNNEL RELEASE Right   . COLONOSCOPY  2015   Dr. Allen Norris  . LEG SURGERY Right 1953  . TOTAL KNEE ARTHROPLASTY Right     FAMILY HISTORY :   Family History  Problem Relation Age of Onset  . Colon cancer Mother 54  . Thrombosis Father 79  . Heart disease Father   . Hypertension Sister   . Hyperlipidemia Sister     SOCIAL HISTORY:   Social History  Substance Use Topics  . Smoking status: Former Smoker    Packs/day: 1.00    Years: 30.00    Types: Cigarettes  . Smokeless tobacco: Former Systems developer      Comment: stopped at age 74; started in teens  . Alcohol use 0.0 oz/week     Comment: wine    ALLERGIES:  has no allergies on file.  MEDICATIONS:  Current Outpatient Prescriptions  Medication Sig Dispense Refill  . aspirin 81 MG tablet Take 1 tablet by mouth daily.    . Calcium Carb-Cholecalciferol (CALCIUM 600 + D) 600-200 MG-UNIT TABS Take 1 tablet by mouth daily. Patient takes 1000 mg daily.    . finasteride (PROSCAR) 5 MG tablet Take 5 mg by mouth daily.    Marland Kitchen lisinopril (PRINIVIL,ZESTRIL) 2.5 MG tablet TAKE 1 TABLET(2.5 MG) BY MOUTH DAILY 30 tablet 2  . loratadine (CLARITIN) 10 MG tablet TAKE 1 TABLET BY MOUTH EVERY DAY 30 tablet 12  . naproxen (NAPROSYN) 500 MG tablet Take 500 mg by mouth daily as needed.    . timolol (TIMOPTIC) 0.5 % ophthalmic solution 1 drop 2 (two) times daily.     No current facility-administered medications for this visit.     PHYSICAL EXAMINATION: ECOG PERFORMANCE STATUS: 0 - Asymptomatic  BP (!) 149/92 (BP Location: Left Arm, Patient Position: Sitting)   Pulse 84   Temp 97 F (36.1 C) (Tympanic)   Resp 18   Ht '5\' 10"'$  (1.778 m)   Wt 221 lb (100.2 kg)  BMI 31.71 kg/m   Filed Weights   08/14/16 1026  Weight: 221 lb (100.2 kg)    GENERAL: Well-nourished well-developed; Alert, no distress and comfortable.   He is alone. He walkingBy himself. EYES: no pallor or icterus OROPHARYNX: no thrush or ulceration; good dentition  NECK: supple, no masses felt LYMPH:  no palpable lymphadenopathy in the cervical, axillary or inguinal regions LUNGS: clear to auscultation and  No wheeze or crackles HEART/CVS: regular rate & rhythm and no murmurs; No lower extremity edema ABDOMEN:abdomen soft, non-tender and normal bowel sounds Musculoskeletal:no cyanosis of digits and no clubbing  PSYCH: alert & oriented x 3 with fluent speech NEURO: no focal motor/sensory deficits SKIN:  no rashes or significant lesions  LABORATORY DATA:  I have reviewed the data as  listed    Component Value Date/Time   NA 136 08/14/2016 1000   NA 139 02/16/2015 1001   K 4.3 08/14/2016 1000   CL 105 08/14/2016 1000   CO2 25 08/14/2016 1000   GLUCOSE 109 (H) 08/14/2016 1000   BUN 17 08/14/2016 1000   BUN 12 02/16/2015 1001   CREATININE 0.80 08/14/2016 1000   CALCIUM 9.2 08/14/2016 1000   PROT 7.0 08/14/2016 1000   PROT 6.3 02/16/2015 1001   ALBUMIN 3.7 08/14/2016 1000   ALBUMIN 3.8 02/16/2015 1001   AST 28 08/14/2016 1000   ALT 18 08/14/2016 1000   ALKPHOS 48 08/14/2016 1000   BILITOT 1.1 08/14/2016 1000   BILITOT 0.8 02/16/2015 1001   GFRNONAA >60 08/14/2016 1000   GFRAA >60 08/14/2016 1000    No results found for: SPEP, UPEP  Lab Results  Component Value Date   WBC 5.7 08/14/2016   NEUTROABS 3.6 08/14/2016   HGB 13.2 08/14/2016   HCT 38.1 (L) 08/14/2016   MCV 96.2 08/14/2016   PLT 101 (L) 08/14/2016      Chemistry      Component Value Date/Time   NA 136 08/14/2016 1000   NA 139 02/16/2015 1001   K 4.3 08/14/2016 1000   CL 105 08/14/2016 1000   CO2 25 08/14/2016 1000   BUN 17 08/14/2016 1000   BUN 12 02/16/2015 1001   CREATININE 0.80 08/14/2016 1000      Component Value Date/Time   CALCIUM 9.2 08/14/2016 1000   ALKPHOS 48 08/14/2016 1000   AST 28 08/14/2016 1000   ALT 18 08/14/2016 1000   BILITOT 1.1 08/14/2016 1000   BILITOT 0.8 02/16/2015 1001        ASSESSMENT & PLAN:   Thrombocytopenia ([place]) # Chronic low platelets [~90s to 100] stable at least since 2006. The etiology is unclear- ITP versus MDS [never had bone marrow biopsy]. 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  Today's platelet count is 101 hemoglobin white count is normal.   Patient continues to be asymptomatic without any bleeding episodes- recommend surveillance.  # Abnormal X-ray at [facility]- recommend follow up PCP.   # Patient follow-up with [place] in 6 months with labs/ preference.   CC: Dr.Hawkins.    Cammie Sickle, MD 08/14/2016 10:47 AM

## 2016-08-14 NOTE — Assessment & Plan Note (Addendum)
#  Chronic low platelets [~90s to 100] stable at least since 2006. The etiology is unclear- ITP versus MDS [never had bone marrow biopsy]. 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  Today's platelet count is 101 hemoglobin white count is normal.   Patient continues to be asymptomatic without any bleeding episodes- recommend surveillance.  # Abnormal X-ray at [facility]- recommend follow up PCP.   # Patient follow-up with [place] in 6 months with labs/ preference.   CC: Dr.Hawkins.

## 2016-08-14 NOTE — Progress Notes (Signed)
Patient here for follow-up for thrombocytopenia.

## 2016-08-28 DIAGNOSIS — H401131 Primary open-angle glaucoma, bilateral, mild stage: Secondary | ICD-10-CM | POA: Diagnosis not present

## 2016-09-02 ENCOUNTER — Encounter: Payer: Self-pay | Admitting: Family Medicine

## 2016-09-02 ENCOUNTER — Other Ambulatory Visit: Payer: Self-pay | Admitting: Family Medicine

## 2016-09-02 ENCOUNTER — Ambulatory Visit (INDEPENDENT_AMBULATORY_CARE_PROVIDER_SITE_OTHER): Payer: Medicare Other | Admitting: Family Medicine

## 2016-09-02 VITALS — BP 138/78 | HR 75 | Temp 97.8°F | Resp 16 | Ht 70.0 in | Wt 219.2 lb

## 2016-09-02 DIAGNOSIS — G609 Hereditary and idiopathic neuropathy, unspecified: Secondary | ICD-10-CM

## 2016-09-02 DIAGNOSIS — J984 Other disorders of lung: Secondary | ICD-10-CM

## 2016-09-02 DIAGNOSIS — I10 Essential (primary) hypertension: Secondary | ICD-10-CM | POA: Diagnosis not present

## 2016-09-02 DIAGNOSIS — G8929 Other chronic pain: Secondary | ICD-10-CM

## 2016-09-02 DIAGNOSIS — M5442 Lumbago with sciatica, left side: Secondary | ICD-10-CM | POA: Diagnosis not present

## 2016-09-02 DIAGNOSIS — M5136 Other intervertebral disc degeneration, lumbar region: Secondary | ICD-10-CM | POA: Diagnosis not present

## 2016-09-02 MED ORDER — LISINOPRIL 5 MG PO TABS: 5.0000 mg | ORAL_TABLET | Freq: Every day | ORAL | 3 refills | Status: DC

## 2016-09-02 MED ORDER — BACLOFEN 10 MG PO TABS: 5.0000 mg | ORAL_TABLET | Freq: Three times a day (TID) | ORAL | 2 refills | Status: DC | PRN

## 2016-09-02 NOTE — Patient Instructions (Addendum)
Thank you for coming to the clinic today.  1. For Blood Pressure - You have slightly elevated reading today, in past you have had some elevated readings - Increase Lisinopril to 5mg  daily (sent new pill to pharmacy or can take 2 of the old 2.5mg  daily) - In the future we can consider adding back the fluid pill Hydrochlorothiazide back in future - Check BP again about 1 to 2 x weekly and write down on BP log bring to next visit  2. For your Back Pain - I think that this is due to Muscle Spasms or strain. Your Sciatic Nerve can be affected causing some of your radiation and numbness down your legs. 2. Continue taking anti-inflammatory Naproxen 500mg  twice daily (12 hrs apart, with food, breakfast and dinner) 1 to 2 times a day every day IF FLARE OF BACK PAIN for 1 week then can use only as needed 3. Start Baclofen (Lioresal) 10mg  tablets - cut in half for 5mg  at night for muscle relaxant - may make you sedated or sleepy (be careful driving or working on this) if tolerated you can take every 8 hours, half or whole tab  Recommend to start taking Tylenol Extra Strength 500mg  tabs - take 1 to 2 tabs per dose (max 1000mg ) every 6-8 hours for pain (take regularly, don't skip a dose for next 7 days), max 24 hour daily dose is 6 tablets or 3000mg . In the future you can repeat the same everyday Tylenol course for 1-2 weeks at a time.   Recommend to start using heating pad on your lower back 1-2x daily for few weeks  This pain may take weeks to months to fully resolve, but hopefully it will respond to the medicine initially. All back injuries (small or serious) are slow to heal since we use our back muscles every day. Be careful with turning, twisting, lifting, sitting / standing for prolonged periods, and avoid re-injury.  If your symptoms significantly worsen with more pain, or new symptoms with weakness in one or both legs, new or different shooting leg pains, numbness in legs or groin, loss of control or  retention of urine or bowel movements, please call back for advice and you may need to go directly to the Emergency Department. --------- Referral to Orthopedics for your Back, and Sciatica, also your lower leg pain may be caused by this. If they do not call you in 2 weeks, then give them a call.  ASK ORTOHPEDICS ABOUT PHYSICAL THERAPY  [facility], [place]  [address] Phone: [phone]  In future we may consider a Nerve Conduction Study if needed due to numbness in feet.  As discussed for the Chest X-ray, the density noted in the neck is a possible concern. I recommend repeat Chest and Neck X-ray in 6-12 months to re-evaluate this and if needed we can consider a CT scan.  Please schedule a follow-up appointment with Dr. Parks Ranger in 6 weeks for HTN, Back Pain, Neuropathy if not improved  If you have any other questions or concerns, please feel free to call the clinic or send a message through [facility]. You may also schedule an earlier appointment if necessary.  Raymond Putnam, DO [facility]
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
## 2016-09-03 DIAGNOSIS — J984 Other disorders of lung: Secondary | ICD-10-CM | POA: Insufficient documentation

## 2016-09-03 NOTE — Assessment & Plan Note (Addendum)
See A&P Low Back Pain Sciatica Suspect LLE neuropathy symptoms are from back, however possible to have peripheral nerve injury seems less likely to affect upper leg and lower. - First start with Orthopedics and further imaging, then if needed next consideration could be [facility] Neurology for NCS - Offered Gabapentin again, patient declined, trial on Baclofen for now, future may need prednisone burst vs very low dose TCA

## 2016-09-03 NOTE — Assessment & Plan Note (Addendum)
Uncertain etiology also with some fibrosis of lungs, based on report from [facility], recommended repeat X-ray with dedicated neck X-ray film vs Chest CT - Discussion on differential, concern is for malignancy, difficult to provide advice on this one since image is unavailable, and he is asymptomatic, provided reassurance overall, and mutual agreement to address his more pressing issues today, and consider X-rays vs CT scan in 6-12 months for this problem

## 2016-09-03 NOTE — Assessment & Plan Note (Signed)
Elevated BP, improved on manual re-check, suspected partly due to active pain. No known complication - Previously on HCTZ (no side effect, stopped due to lower BP)  Plan: 1. Increase Lisinopril from 2.5mg  to 5mg  daily - no other anti-HTN therapy at this time. Reviewed renal protection and BP control for prevention to avoid SBP closer to 140. 2. Start checking outside BP regularly to monitor progress, handout given BP log 3. Follow-up within 6 weeks to 3 months for HTN - if needed can add back low dose thiazide or PRN lasix if swelling worsening

## 2016-09-03 NOTE — Assessment & Plan Note (Signed)
Worsening chronic midline LBP with associated suspected L radiculopathy vs sciatica with more muscular cramping symptoms. Without known injury or trauma. In setting of known chronic LBP with DJD - Inadequate conservative therapy - Unable to tolerate Gabapentin  Plan: 1. Discussion on diagnosis, prognosis, and management of Osteoarthritis as likely cause 2. Considered Lumbar X-rays given chronicity, agree to defer for today 3. May continue Naproxen 500mg  1-2 times daily start to take regularly if flare. Use Tylenol max dose PRN breakthrough and in future if helping shift to Tylenol regularly and Naproxen PRN flare only 4. Start muscle relaxant with Baclofen 10mg  tabs - take 5-10mg  up to TID PRN, titrate up as tolerated, caution sedation 5. Encouraged use of heating pad 1-2x daily for now then PRN 6. Referral to [facility] - further eval and management of OA/DJD with concern for radiculopathy, not suggestive of neuropathic claudication symptoms but would benefit from advanced imaging and possibly guided PT as patient asking about PT - discussion that given complexity of L leg possible radicular symptoms, will hold on PT today 7. Follow-up 6 weeks re-evaluation

## 2016-10-02 DIAGNOSIS — S32040A Wedge compression fracture of fourth lumbar vertebra, initial encounter for closed fracture: Secondary | ICD-10-CM | POA: Diagnosis not present

## 2016-10-02 DIAGNOSIS — M4696 Unspecified inflammatory spondylopathy, lumbar region: Secondary | ICD-10-CM | POA: Diagnosis not present

## 2016-10-02 DIAGNOSIS — M545 Low back pain: Secondary | ICD-10-CM | POA: Diagnosis not present

## 2016-10-02 DIAGNOSIS — M5136 Other intervertebral disc degeneration, lumbar region: Secondary | ICD-10-CM | POA: Diagnosis not present

## 2016-10-03 ENCOUNTER — Other Ambulatory Visit: Payer: Self-pay | Admitting: Orthopedic Surgery

## 2016-10-03 DIAGNOSIS — M5136 Other intervertebral disc degeneration, lumbar region: Secondary | ICD-10-CM

## 2016-10-03 DIAGNOSIS — S32040A Wedge compression fracture of fourth lumbar vertebra, initial encounter for closed fracture: Secondary | ICD-10-CM

## 2016-10-03 DIAGNOSIS — M47816 Spondylosis without myelopathy or radiculopathy, lumbar region: Secondary | ICD-10-CM

## 2016-10-08 ENCOUNTER — Ambulatory Visit
Admission: RE | Admit: 2016-10-08 | Discharge: 2016-10-08 | Disposition: A | Discharge status: Home / Self Care | Payer: Medicare Other | Source: Ambulatory Visit | Attending: Orthopedic Surgery | Admitting: Orthopedic Surgery

## 2016-10-08 DIAGNOSIS — M48061 Spinal stenosis, lumbar region without neurogenic claudication: Secondary | ICD-10-CM | POA: Diagnosis not present

## 2016-10-08 DIAGNOSIS — M47816 Spondylosis without myelopathy or radiculopathy, lumbar region: Secondary | ICD-10-CM

## 2016-10-08 DIAGNOSIS — X58XXXA Exposure to other specified factors, initial encounter: Secondary | ICD-10-CM | POA: Diagnosis not present

## 2016-10-08 DIAGNOSIS — M5136 Other intervertebral disc degeneration, lumbar region: Secondary | ICD-10-CM | POA: Diagnosis present

## 2016-10-08 DIAGNOSIS — S32040A Wedge compression fracture of fourth lumbar vertebra, initial encounter for closed fracture: Secondary | ICD-10-CM | POA: Diagnosis not present

## 2016-10-08 DIAGNOSIS — S32000A Wedge compression fracture of unspecified lumbar vertebra, initial encounter for closed fracture: Secondary | ICD-10-CM | POA: Diagnosis not present

## 2016-10-16 ENCOUNTER — Encounter: Payer: Self-pay | Admitting: Family Medicine

## 2016-10-16 ENCOUNTER — Ambulatory Visit (INDEPENDENT_AMBULATORY_CARE_PROVIDER_SITE_OTHER): Payer: Medicare Other | Admitting: Family Medicine

## 2016-10-16 ENCOUNTER — Other Ambulatory Visit: Payer: Self-pay | Admitting: Family Medicine

## 2016-10-16 VITALS — BP 125/63 | HR 70 | Temp 97.6°F | Ht 70.0 in | Wt 216.0 lb

## 2016-10-16 DIAGNOSIS — M5136 Other intervertebral disc degeneration, lumbar region: Secondary | ICD-10-CM

## 2016-10-16 DIAGNOSIS — S32040A Wedge compression fracture of fourth lumbar vertebra, initial encounter for closed fracture: Secondary | ICD-10-CM | POA: Diagnosis not present

## 2016-10-16 DIAGNOSIS — G8929 Other chronic pain: Secondary | ICD-10-CM | POA: Diagnosis not present

## 2016-10-16 DIAGNOSIS — I10 Essential (primary) hypertension: Secondary | ICD-10-CM

## 2016-10-16 DIAGNOSIS — M5442 Lumbago with sciatica, left side: Secondary | ICD-10-CM | POA: Diagnosis not present

## 2016-10-16 MED ORDER — CALCITONIN (SALMON) 200 UNIT/ACT NA SOLN: 1.0000 | Freq: Every day | NASAL | 0 refills | Status: DC

## 2016-10-16 NOTE — Progress Notes (Signed)
Subjective:    Patient ID: Raymond Barnett, male    DOB: January 05, 1930, 83 y.o.   MRN: [id number]  Raymond Barnett is a 83 y.o. male presenting on 10/16/2016 for Hypertension  Patient provides history, and he is accompanied by wife, Kathlee Nations, and their daughter, Arville Go.  HPI   CHRONIC HTN: Reports BP is improved since inc dose Lisinopril 2.5 to 5 last visit 08/2016. No new concerns. He has home record of BP readings avg 110-130, mostly 120s/70-80s Current Meds - Lisinopril 5mg  daily Reports good compliance, took med today. Tolerating well, w/o complaints. Lifestyle - Exercise: Still no regular exercise. Limited by back pain more recently Denies CP, dyspnea, HA, edema, dizziness / lightheadedness  Acute L4 Compression Fracture / Chronic Low Back Pain / History of Neuropathy - Last visit with me 08/2016, for same problem, treated with Naproxen, new med Baclofen, regular Tylenol, other conservative measures and referral to [place]. See prior notes for background information. - Interval update with established with [facility] 10/02/16, with Lumbar spine X-ray showing mod to severe DJD, multiple levels, possible compression deformity L4, and moderate to severe facet arthritis. He was arranged for MRI on 10/08/16, results below, confirm acute compression L4 fracture - Today patient reports to review MRI and discuss back pain. He reports overall he has done better with treatments and no further injury. He feels fairly well at rest and while sitting, but still admits to notable pain with activity. Family encouraged him to follow-up further and he is considering procedure for his back. - Denies any weakness, loss of control bladder/bowel incontinence or retention, unintentional wt loss, night sweats   Past Surgical History:  Procedure Laterality Date  . CARPAL TUNNEL RELEASE Left   . CARPAL TUNNEL RELEASE Right   . COLONOSCOPY  2015   Dr. Allen Norris  . LEG SURGERY Right 1953  . TOTAL KNEE ARTHROPLASTY Right     Social History  Substance Use Topics  . Smoking status: Former Smoker    Packs/day: 1.00    Years: 30.00    Types: Cigarettes  . Smokeless tobacco: Former Systems developer     Comment: stopped at age 50; started in teens  . Alcohol use 0.0 oz/week     Comment: wine    Review of Systems Per HPI unless specifically indicated above     Objective:    BP 125/63 (BP Location: Left Arm, Patient Position: Sitting, Cuff Size: Normal)   Pulse 70   Temp 97.6 F (36.4 C) (Oral)   Ht 5\' 10"  (1.778 m)   Wt 216 lb (98 kg)   BMI 30.99 kg/m   Wt Readings from Last 3 Encounters:  10/16/16 216 lb (98 kg)  09/02/16 219 lb 3.2 oz (99.4 kg)  08/14/16 221 lb (100.2 kg)    Physical Exam  Constitutional: He is oriented to person, place, and time. He appears well-developed and well-nourished. No distress.  Well-appearing, mostly comfortable at rest while seated, some back pain with movement, cooperative  HENT:  Head: Normocephalic and atraumatic.  Mouth/Throat: Oropharynx is clear and moist.  Eyes: Conjunctivae are normal.  Neck: Normal range of motion. Neck supple.  Cardiovascular: Normal rate, regular rhythm, normal heart sounds and intact distal pulses.   No murmur heard. Pulmonary/Chest: Effort normal and breath sounds normal. No respiratory distress. He has no wheezes. He has no rales.  Musculoskeletal: Normal range of motion. He exhibits no edema.  Low Back Inspection: Normal appearance, no spinal deformity, symmetrical. Palpation: Mild tenderness  over lower lumbar spinous processes approx L4 region. Bilateral lumbar paraspinal muscles non-tender but still with mild hypertonicity/spasm. ROM: Some limited forward flex/ back extension Strength: Bilateral hip flex/ext 5/5, knee flex/ext 5/5, ankle dorsiflex/plantarflex 5/5 Neurovascular: Chronic reduced sensation to light touch RLE, today left lower extremity stable without significant change to light touch  Neurological: He is alert and oriented  to person, place, and time.  Skin: Skin is warm and dry. No rash noted. He is not diaphoretic. No erythema.  Chronic post-op changes Right lower extremity medial, ankle  Psychiatric: His behavior is normal.  Nursing note and vitals reviewed.   I have personally reviewed the radiology report from  on 10/08/16.  CLINICAL DATA:Initial evaluation for acute L4 compression fracture.  EXAM: MRI LUMBAR SPINE WITHOUT CONTRAST  TECHNIQUE: Multiplanar, multisequence MR imaging of the lumbar spine was performed. No intravenous contrast was administered.  COMPARISON:None available.  FINDINGS: Segmentation: Normal segmentation. Lowest well-formed disc labeled the L5-S1 level.  Alignment: Straightening of the normal lumbar lordosis. Trace anterolisthesis of L3 on L4 andL5 on S1.  Vertebrae: Abnormal T1 hypointense, T2/STIR hyperintense signal intensity extends through the mild central height loss of approximately 30% without bony retropulsion. This is benign in appearance.  Vertebral body heights otherwise maintained. No other acute or chronic fracture. Reactive endplate changes noted about the L2-3 and L5-S1 interspaces. No discrete or worrisome osseous lesions. Signal intensity within the vertebral body bone marrow normal. Mid and inferior aspect of the L4 vertebral body, compatible with acute compression fracture.  Conus medullaris: Extends to the T12-L1 level and appears normal.  Paraspinal and other soft tissues: Paraspinous edema about the L4 compression fracture. Paraspinous soft tissues otherwise normal. Scattered T2 hyperintense cyst noted within the kidneys bilaterally. Visualized visceral structures otherwise unremarkable.  Disc levels:  L1-2: Mild disc bulge with disc desiccation. Facet and ligamentum flavum hypertrophy. No canal stenosis. Mild left L1 foraminal narrowing.  L2-3: Diffuse degenerative disc bulge with disc desiccation and intervertebral disc space  narrowing. Reactive endplate changes. Moderate facet and ligamentum flavum hypertrophy. Resultant moderate to severe canal and bilateral subarticular stenosis. Thecal sac measures 10 mm in AP diameter. Moderate bilateral L2 foraminal stenosis.  L3-4: Trace anterolisthesis of L3 on L4. Diffuse circumferential disc bulge with disc desiccation. Moderate bilateral facet arthrosis with ligamentum flavum hypertrophy. Asymmetric perfusion noted within the left L3-4 facet. Possible tiny 3 mm synovial cyst noted at the medial aspect of the right L3-4 facet (series 5, image 23). Resultant severe canal and bilateral subarticular stenosis. Thecal sac measures the 7 mm in AP diameter. Severe bilateral L3 foraminal stenosis.  L4-5: Diffuse disc bulge with intervertebral disc space narrowing. Disc bulging slightly centric to the right without focal disc protrusion. Moderate bilateral facet and ligamentum flavum hypertrophy. Severe canal and bilateral subarticular stenosis. Thecal sac measures approximately 7 mm in AP diameter. Mild right with moderate left L4 foraminal stenosis.  L5-S1: Trace anterolisthesis of L5 on S1. Diffuse disc bulge with intervertebral disc space narrowing and chronic reactive endplate changes. Bulging disc closely approximates the descending S1 nerve roots without frank neural impingement or significant stenosis. Mild bilateral facet arthrosis. Mild to moderate bilateral L5 foraminal narrowing.  IMPRESSION: 1. Acute compression fracture involving the L4 vertebral body with mild 30% central height loss without bony retropulsion. 2. Multilevel degenerative spondylolysis and facet arthrosis at L2-3 through L4-5 with resultant moderate to severe canal and subarticular stenosis as detailed above. 3. Multilevel degenerative changes with resultant multilevel foraminal narrowing as above. Notable  changes include moderate bilateral L2 foraminal stenosis, severe bilateral L3  foraminal narrowing, moderate left L4 foraminal stenosis, and mild to moderate bilateral L5 foraminal narrowing.  Electronically Signed By: BenjaminMcClintock M.D. On: 10/09/2016 03:33    Results for orders placed or performed in visit on 09/02/16  Hemoglobin A1c  Result Value Ref Range   Hemoglobin A1C 5.0   Lipid panel  Result Value Ref Range   Cholesterol, Total 152    Triglycerides 156 (A) 50   LDL 92    HDL 50   VITAMIN D 25 Hydroxy (Vit-D Deficiency, Fractures)  Result Value Ref Range   Vitamin D, 25-Hydroxy 34.0       Assessment & Plan:   Problem List Items Addressed This Visit    Essential hypertension    Well-controlled HTN - Home BP readings normal, reviewed today  No known complications   Plan:  1. Continue current BP regimen - Lisinopril 5mg  daily 2. Encourage improved lifestyle - low sodium diet, regular exercise 3. Continue monitor BP outside office, infrequent readings now, if persistently >140/90 or new symptoms notify office sooner 4. Follow-up 6 months      DDD (degenerative disc disease), lumbar   Relevant Medications   oxyCODONE (OXY IR/ROXICODONE) 5 MG immediate release tablet   Chronic low back pain with left-sided sciatica    Suspected multifactorial LBP, prior assessment with known OA/DJD multi level lumbar, and concern sciatica vs radicular symptoms. Now known Acute L4 compression fracture without trauma, likely additional pain generator - Last imaging Lumbar MRI 10/08/16 - Followed by [place]: 1. Reviewed MRI results today, provided results, overall advised patient that he should follow-up as planned with Orthopedics to officially review MRI and discuss potential surgical options for compression fracture, and may need other management of significant OA/DJD 2. Offered nasal calcitonin for acute compression pain alternating nares 1 spray x 4 weeks, sent rx to pharmacy, may discuss with ortho or consider this to try if interested 3.  Continue other current therapy, avoid re-injury 4. May need future DEXA for concern potential osteopenia vs osteoporosis with atraumatic compression fracture 5. Follow-up as needed after Ortho      Relevant Medications   oxyCODONE (OXY IR/ROXICODONE) 5 MG immediate release tablet    Other Visit Diagnoses    Closed compression fracture of fourth lumbar vertebra, initial encounter ([place])    -  Primary  Suspected primary etiology for current pain, overall likely multifactorial with severe OA/DJD - See A&P above for back pain       Meds ordered this encounter  Medications  . DISCONTD: lisinopril (PRINIVIL,ZESTRIL) 2.5 MG tablet  .           . calcitonin, salmon, (MIACALCIN/FORTICAL) 200 UNIT/ACT nasal spray    Sig: Place 1 spray into alternate nostrils daily. For 4 weeks    Dispense:  3.7 mL    Refill:  0   Follow up plan: Return in about 6 months (around 04/17/2017) for blood pressure.  Nobie Putnam, [facility] 10/16/2016, 10:07 PM

## 2016-10-16 NOTE — Assessment & Plan Note (Signed)
Suspected multifactorial LBP, prior assessment with known OA/DJD multi level lumbar, and concern sciatica vs radicular symptoms. Now known Acute L4 compression fracture without trauma, likely additional pain generator - Last imaging Lumbar MRI 10/08/16 - Followed by [facility]: 1. Reviewed MRI results today, provided results, overall advised patient that he should follow-up as planned with Orthopedics to officially review MRI and discuss potential surgical options for compression fracture, and may need other management of significant OA/DJD 2. Offered nasal calcitonin for acute compression pain alternating nares 1 spray x 4 weeks, sent rx to pharmacy, may discuss with ortho or consider this to try if interested 3. Continue other current therapy, avoid re-injury 4. May need future DEXA for concern potential osteopenia vs osteoporosis with atraumatic compression fracture 5. Follow-up as needed after Ortho

## 2016-10-16 NOTE — Patient Instructions (Addendum)
Thank you for coming to the clinic today.  1. You do have Acute Compression Fracture L4 Vertebrae Lumbar in Back  This may be caused by minor trauma or even just everyday activities. Be careful with sitting down forcefully.  As discussed, I recommend that you schedule with Dorise Hiss PA or Dr Grisel[facility] to discuss your MRI results and discuss potential Kyphoplasty or other surgery options  Ask them about Nasal Calcitonin - I have sent rx to [facility] already  2. Keep up the good work with BP - No change to medication, continue Lisinopril 5mg   Check BP once weekly or few times a month  Please schedule a Follow-up Appointment to: Return in about 6 months (around 04/17/2017) for blood pressure.  If you have any other questions or concerns, please feel free to call the clinic or send a message through [place]. You may also schedule an earlier appointment if necessary.  Additionally, you may be receiving a survey about your experience at our clinic within a few days to 1 week by e-mail or mail. We value your feedback.  Nobie Putnam, DO [facility]

## 2016-10-16 NOTE — Assessment & Plan Note (Signed)
Well-controlled HTN - Home BP readings normal, reviewed today  No known complications   Plan:  1. Continue current BP regimen - Lisinopril 5mg  daily 2. Encourage improved lifestyle - low sodium diet, regular exercise 3. Continue monitor BP outside office, infrequent readings now, if persistently >140/90 or new symptoms notify office sooner 4. Follow-up 6 months

## 2016-10-22 DIAGNOSIS — M4696 Unspecified inflammatory spondylopathy, lumbar region: Secondary | ICD-10-CM | POA: Diagnosis not present

## 2016-10-22 DIAGNOSIS — S32040D Wedge compression fracture of fourth lumbar vertebra, subsequent encounter for fracture with routine healing: Secondary | ICD-10-CM | POA: Diagnosis not present

## 2016-10-22 DIAGNOSIS — M5136 Other intervertebral disc degeneration, lumbar region: Secondary | ICD-10-CM | POA: Diagnosis not present

## 2016-12-05 DIAGNOSIS — M47816 Spondylosis without myelopathy or radiculopathy, lumbar region: Secondary | ICD-10-CM | POA: Diagnosis not present

## 2016-12-18 DIAGNOSIS — I83209 Varicose veins of unspecified lower extremity with both ulcer of unspecified site and inflammation: Secondary | ICD-10-CM | POA: Diagnosis not present

## 2016-12-18 DIAGNOSIS — I8390 Asymptomatic varicose veins of unspecified lower extremity: Secondary | ICD-10-CM | POA: Diagnosis not present

## 2016-12-18 DIAGNOSIS — Z87891 Personal history of nicotine dependence: Secondary | ICD-10-CM | POA: Diagnosis not present

## 2016-12-18 DIAGNOSIS — L97909 Non-pressure chronic ulcer of unspecified part of unspecified lower leg with unspecified severity: Secondary | ICD-10-CM | POA: Diagnosis not present

## 2016-12-18 DIAGNOSIS — L97312 Non-pressure chronic ulcer of right ankle with fat layer exposed: Secondary | ICD-10-CM | POA: Diagnosis not present

## 2016-12-18 DIAGNOSIS — Z981 Arthrodesis status: Secondary | ICD-10-CM | POA: Diagnosis not present

## 2016-12-18 DIAGNOSIS — I83213 Varicose veins of right lower extremity with both ulcer of ankle and inflammation: Secondary | ICD-10-CM | POA: Diagnosis not present

## 2016-12-26 DIAGNOSIS — Z981 Arthrodesis status: Secondary | ICD-10-CM | POA: Diagnosis not present

## 2016-12-26 DIAGNOSIS — I83209 Varicose veins of unspecified lower extremity with both ulcer of unspecified site and inflammation: Secondary | ICD-10-CM | POA: Diagnosis not present

## 2016-12-26 DIAGNOSIS — L97909 Non-pressure chronic ulcer of unspecified part of unspecified lower leg with unspecified severity: Secondary | ICD-10-CM | POA: Diagnosis not present

## 2016-12-26 DIAGNOSIS — L97312 Non-pressure chronic ulcer of right ankle with fat layer exposed: Secondary | ICD-10-CM | POA: Diagnosis not present

## 2016-12-30 DIAGNOSIS — M1611 Unilateral primary osteoarthritis, right hip: Secondary | ICD-10-CM | POA: Diagnosis not present

## 2016-12-30 DIAGNOSIS — M5136 Other intervertebral disc degeneration, lumbar region: Secondary | ICD-10-CM | POA: Diagnosis not present

## 2016-12-30 DIAGNOSIS — M4696 Unspecified inflammatory spondylopathy, lumbar region: Secondary | ICD-10-CM | POA: Diagnosis not present

## 2017-01-13 DIAGNOSIS — I83209 Varicose veins of unspecified lower extremity with both ulcer of unspecified site and inflammation: Secondary | ICD-10-CM | POA: Diagnosis not present

## 2017-01-13 DIAGNOSIS — L97909 Non-pressure chronic ulcer of unspecified part of unspecified lower leg with unspecified severity: Secondary | ICD-10-CM | POA: Diagnosis not present

## 2017-01-13 DIAGNOSIS — L97312 Non-pressure chronic ulcer of right ankle with fat layer exposed: Secondary | ICD-10-CM | POA: Diagnosis not present

## 2017-02-12 ENCOUNTER — Encounter: Payer: Self-pay | Admitting: Internal Medicine

## 2017-02-12 ENCOUNTER — Inpatient Hospital Stay: Payer: Medicare Other | Attending: Internal Medicine

## 2017-02-12 ENCOUNTER — Ambulatory Visit
Admission: RE | Admit: 2017-02-12 | Discharge: 2017-02-12 | Disposition: A | Discharge status: Home / Self Care | Payer: Medicare Other | Source: Ambulatory Visit | Attending: Nurse Practitioner | Admitting: Nurse Practitioner

## 2017-02-12 ENCOUNTER — Ambulatory Visit
Admission: RE | Admit: 2017-02-12 | Discharge: 2017-02-12 | Disposition: A | Discharge status: Home / Self Care | Payer: Medicare Other | Source: Ambulatory Visit | Attending: Internal Medicine | Admitting: Internal Medicine

## 2017-02-12 ENCOUNTER — Inpatient Hospital Stay (HOSPITAL_BASED_OUTPATIENT_CLINIC_OR_DEPARTMENT_OTHER): Payer: Medicare Other | Admitting: Internal Medicine

## 2017-02-12 VITALS — BP 162/88 | HR 73 | Temp 97.4°F | Resp 16 | Wt 215.0 lb

## 2017-02-12 DIAGNOSIS — R05 Cough: Secondary | ICD-10-CM | POA: Diagnosis not present

## 2017-02-12 DIAGNOSIS — Z87891 Personal history of nicotine dependence: Secondary | ICD-10-CM

## 2017-02-12 DIAGNOSIS — S91001S Unspecified open wound, right ankle, sequela: Secondary | ICD-10-CM | POA: Diagnosis not present

## 2017-02-12 DIAGNOSIS — Z800 Family history of malignant neoplasm of digestive organs: Secondary | ICD-10-CM

## 2017-02-12 DIAGNOSIS — R0602 Shortness of breath: Secondary | ICD-10-CM | POA: Insufficient documentation

## 2017-02-12 DIAGNOSIS — J3489 Other specified disorders of nose and nasal sinuses: Secondary | ICD-10-CM

## 2017-02-12 DIAGNOSIS — M5136 Other intervertebral disc degeneration, lumbar region: Secondary | ICD-10-CM | POA: Diagnosis not present

## 2017-02-12 DIAGNOSIS — Z79899 Other long term (current) drug therapy: Secondary | ICD-10-CM | POA: Diagnosis not present

## 2017-02-12 DIAGNOSIS — N400 Enlarged prostate without lower urinary tract symptoms: Secondary | ICD-10-CM

## 2017-02-12 DIAGNOSIS — Z7982 Long term (current) use of aspirin: Secondary | ICD-10-CM | POA: Insufficient documentation

## 2017-02-12 DIAGNOSIS — J984 Other disorders of lung: Secondary | ICD-10-CM

## 2017-02-12 DIAGNOSIS — Z8619 Personal history of other infectious and parasitic diseases: Secondary | ICD-10-CM | POA: Insufficient documentation

## 2017-02-12 DIAGNOSIS — D696 Thrombocytopenia, unspecified: Secondary | ICD-10-CM

## 2017-02-12 DIAGNOSIS — I517 Cardiomegaly: Secondary | ICD-10-CM | POA: Diagnosis not present

## 2017-02-12 LAB — CBC WITH DIFFERENTIAL/PLATELET
Basophils Absolute: 0 10*3/uL (ref 0–0.1)
Basophils Relative: 0 %
EOS ABS: 0.1 10*3/uL (ref 0–0.7)
EOS PCT: 2 %
HCT: 39.7 % — ABNORMAL LOW (ref 40.0–52.0)
Hemoglobin: 13.4 g/dL (ref 13.0–18.0)
LYMPHS ABS: 2 10*3/uL (ref 1.0–3.6)
Lymphocytes Relative: 28 %
MCH: 32.9 pg (ref 26.0–34.0)
MCHC: 33.7 g/dL (ref 32.0–36.0)
MCV: 97.6 fL (ref 80.0–100.0)
MONO ABS: 0.6 10*3/uL (ref 0.2–1.0)
Monocytes Relative: 9 %
Neutro Abs: 4.4 10*3/uL (ref 1.4–6.5)
Neutrophils Relative %: 61 %
PLATELETS: 124 10*3/uL — AB (ref 150–440)
RBC: 4.07 MIL/uL — AB (ref 4.40–5.90)
RDW: 16.7 % — AB (ref 11.5–14.5)
WBC: 7.1 10*3/uL (ref 3.8–10.6)

## 2017-02-12 LAB — BASIC METABOLIC PANEL
ANION GAP: 9 (ref 5–15)
BUN: 16 mg/dL (ref 6–20)
CALCIUM: 9.5 mg/dL (ref 8.9–10.3)
CO2: 24 mmol/L (ref 22–32)
Chloride: 103 mmol/L (ref 101–111)
Creatinine, Ser: 0.81 mg/dL (ref 0.61–1.24)
GFR calc Af Amer: >60 mL/min (ref >60)
GLUCOSE: 88 mg/dL (ref 65–99)
Potassium: 4.3 mmol/L (ref 3.5–5.1)
Sodium: 136 mmol/L (ref 135–145)

## 2017-02-12 NOTE — Progress Notes (Signed)
Patient here for follow up with labs today. He states that he has no pain while sitting still, but has significant pain in his back and legs when he walks. He has already received his Flu Vaccine.

## 2017-02-12 NOTE — Assessment & Plan Note (Signed)
#  Chronic low platelets [~90s to 100] stable at least since 2006. The etiology is unclear- ITP versus MDS [never had bone marrow biopsy]. 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  Today's platelet count is 124 hemoglobin 13.4 white count 7.1.   Patient continues to be asymptomatic without any bleeding episodes- recommend surveillance. Platelet count ok to proceed with skin graft tomorrow.   # Abnormal X-ray at [facility]- symptomatic today and did not have f/u ct scan. Will repeat chest x-ray today and if unchanged or suspicious findings will follow up with CT scan. Pt in agreement.  # Patient follow-up with [place] in 6 months with labs/ preference.

## 2017-02-12 NOTE — Progress Notes (Signed)
[facility] OFFICE PROGRESS NOTE  Patient Care Team: [name], DO as PCP - General (Family Medicine)   SUMMARY OF ONCOLOGIC HISTORY:  # 2006 CHRONIC LOW THROMBOCYTOPENIA- ITP [clinically more likely] vs MDS [ No BMBx]; [place] 2013- Normal spleen; asymptomatic on surveillance  INTERVAL HISTORY:  A very pleasant 83 year old male patient who looks much younger than his stated age and is here for follow-up.   Patient continues to deny any easy or unexplained bruising or bleeding. He denies weight loss, night sweats, nausea, vomiting, or chest pain. He has occasional cough, rhinorrhea, and sob on exertion that has slightly worsened since we last saw him in April 2018. On previous imaging he has had a 'shadow' on the lungs and it was recommended that he have a follow up CT scan which he never had. He reports having smoked when he was young but has quit for 30+ years. Also complains of wheezing when laying flat and has hx of seasonal allergies. Previously he has taken Claritin but stopped 3 weeks ago.   He has suffered a non-healing wound on his right ankle and is schedule for a cadaver skin graft at [facility] tomorrow.   REVIEW OF SYSTEMS:  A complete 10 point review of system is done which is negative except mentioned above/history of present illness.   PAST MEDICAL HISTORY :  Past Medical History:  Diagnosis Date  . Arthritis   . Benign prostate hyperplasia    lower urinary tract symptoms  . Cataracts, bilateral   . DDD (degenerative disc disease), lumbar   . Glaucoma   . H/O carpal tunnel syndrome   . History of osteomyelitis 1953   right leg  . Thrombocytopenia ([place])     PAST SURGICAL HISTORY :   Past Surgical History:  Procedure Laterality Date  . CARPAL TUNNEL RELEASE Left   . CARPAL TUNNEL RELEASE Right   . COLONOSCOPY  2015   Dr.  Norris  . LEG SURGERY Right 1953  . TOTAL KNEE ARTHROPLASTY Right     FAMILY HISTORY :   Family History  Problem  Relation Age of Onset  . Colon cancer Mother 46  . Thrombosis Father 21  . Heart disease Father   . Hypertension Sister   . Hyperlipidemia Sister     SOCIAL HISTORY:   Social History  Substance Use Topics  . Smoking status: Former Smoker    Packs/day: 1.00    Years: 30.00    Types: Cigarettes  . Smokeless tobacco: Former Systems developer     Comment: stopped at age 89; started in teens  . Alcohol use 0.0 oz/week     Comment: wine    ALLERGIES:  has No Known Allergies.  MEDICATIONS:  Current Outpatient Prescriptions  Medication Sig Dispense Refill  . aspirin 81 MG tablet Take 1 tablet by mouth daily.    . Calcium Carb-Cholecalciferol (CALCIUM 600 + D) 600-200 MG-UNIT TABS Take 1 tablet by mouth daily. Patient takes 1000 mg daily.    . finasteride (PROSCAR) 5 MG tablet Take 5 mg by mouth daily.    Marland Kitchen lisinopril (PRINIVIL,ZESTRIL) 5 MG tablet Take 1 tablet (5 mg total) by mouth daily. 90 tablet 3  . naproxen (NAPROSYN) 500 MG tablet Take 500 mg by mouth daily as needed.    . Omega-3 Fatty Acids (FISH OIL) 1200 MG CAPS Take by mouth daily.    . timolol (TIMOPTIC) 0.5 % ophthalmic solution 1 drop 2 (two) times daily.     No  current facility-administered medications for this visit.     PHYSICAL EXAMINATION: ECOG PERFORMANCE STATUS: 0 - Asymptomatic  BP (!) 162/88 (BP Location: Right Arm, Patient Position: Sitting)   Pulse 73   Temp (!) 97.4 F (36.3 C) (Tympanic)   Resp 16   Wt 215 lb (97.5 kg)   BMI 30.85 kg/m   Filed Weights   02/12/17 1406  Weight: 215 lb (97.5 kg)    GENERAL: Well-nourished well-developed; Alert, no distress and comfortable. Accompanied by his wife. Ambulating w/o aids.  EYES: no pallor or icterus OROPHARYNX: no thrush or ulceration; good dentition  NECK: supple, no masses felt LYMPH:  no palpable lymphadenopathy in the cervical, axillary or inguinal regions LUNGS: clear to auscultation and  Without wheezes or crackles.  HEART/CVS: regular rate & rhythm  and no murmurs; No lower extremity edema ABDOMEN:abdomen soft, non-tender and normal bowel sounds Musculoskeletal:no cyanosis of digits and no clubbing  PSYCH: alert & oriented x 3 with fluent speech NEURO: no focal motor/sensory deficits SKIN:  no rashes or significant lesions. Wound to right ankle wrapped with dressing.   LABORATORY DATA:  I have reviewed the data as listed    Component Value Date/Time   NA 136 02/12/2017 1305   NA 139 02/16/2015 1001   K 4.3 02/12/2017 1305   CL 103 02/12/2017 1305   CO2 24 02/12/2017 1305   GLUCOSE 88 02/12/2017 1305   BUN 16 02/12/2017 1305   BUN 12 02/16/2015 1001   CREATININE 0.81 02/12/2017 1305   CALCIUM 9.5 02/12/2017 1305   PROT 7.0 08/14/2016 1000   PROT 6.3 02/16/2015 1001   ALBUMIN 3.7 08/14/2016 1000   ALBUMIN 3.8 02/16/2015 1001   AST 28 08/14/2016 1000   ALT 18 08/14/2016 1000   ALKPHOS 48 08/14/2016 1000   BILITOT 1.1 08/14/2016 1000   BILITOT 0.8 02/16/2015 1001   GFRNONAA >60 02/12/2017 1305   GFRAA >60 02/12/2017 1305    No results found for: SPEP, UPEP  Lab Results  Component Value Date   WBC 7.1 02/12/2017   NEUTROABS 4.4 02/12/2017   HGB 13.4 02/12/2017   HCT 39.7 (L) 02/12/2017   MCV 97.6 02/12/2017   PLT 124 (L) 02/12/2017      Chemistry      Component Value Date/Time   NA 136 02/12/2017 1305   NA 139 02/16/2015 1001   K 4.3 02/12/2017 1305   CL 103 02/12/2017 1305   CO2 24 02/12/2017 1305   BUN 16 02/12/2017 1305   BUN 12 02/16/2015 1001   CREATININE 0.81 02/12/2017 1305      Component Value Date/Time   CALCIUM 9.5 02/12/2017 1305   ALKPHOS 48 08/14/2016 1000   AST 28 08/14/2016 1000   ALT 18 08/14/2016 1000   BILITOT 1.1 08/14/2016 1000   BILITOT 0.8 02/16/2015 1001        ASSESSMENT & PLAN:   Thrombocytopenia ([place]) # Chronic low platelets [~90s to 100] stable at least since 2006. The etiology is unclear- ITP versus MDS [never had bone marrow biopsy]. 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  Today's  platelet count is 124 hemoglobin  13.4 white count 7.1.   Patient continues to be asymptomatic without any bleeding episodes- recommend surveillance. Platelet count ok to proceed with skin graft tomorrow.   # Abnormal X-ray at [facility]- symptomatic today and did not have f/u ct scan. Will repeat chest x-ray today and if unchanged or suspicious findings will follow up with CT scan. Pt in agreement.  #  Patient follow-up with [place] in 6 months with labs/ preference.    Leatha Rohner G. Zenia Resides, NP 02/12/17 2:40 PM   Verlon Au, NP 02/12/2017 2:47 PM

## 2017-02-13 DIAGNOSIS — I83213 Varicose veins of right lower extremity with both ulcer of ankle and inflammation: Secondary | ICD-10-CM | POA: Diagnosis not present

## 2017-02-13 DIAGNOSIS — L97312 Non-pressure chronic ulcer of right ankle with fat layer exposed: Secondary | ICD-10-CM | POA: Diagnosis not present

## 2017-02-13 DIAGNOSIS — I83209 Varicose veins of unspecified lower extremity with both ulcer of unspecified site and inflammation: Secondary | ICD-10-CM | POA: Diagnosis not present

## 2017-02-13 DIAGNOSIS — L97909 Non-pressure chronic ulcer of unspecified part of unspecified lower leg with unspecified severity: Secondary | ICD-10-CM | POA: Diagnosis not present

## 2017-02-14 ENCOUNTER — Other Ambulatory Visit: Payer: Self-pay | Admitting: Nurse Practitioner

## 2017-02-14 DIAGNOSIS — J984 Other disorders of lung: Secondary | ICD-10-CM

## 2017-02-27 DIAGNOSIS — L97909 Non-pressure chronic ulcer of unspecified part of unspecified lower leg with unspecified severity: Secondary | ICD-10-CM | POA: Diagnosis not present

## 2017-02-27 DIAGNOSIS — I83209 Varicose veins of unspecified lower extremity with both ulcer of unspecified site and inflammation: Secondary | ICD-10-CM | POA: Diagnosis not present

## 2017-02-27 DIAGNOSIS — Z981 Arthrodesis status: Secondary | ICD-10-CM | POA: Diagnosis not present

## 2017-02-27 DIAGNOSIS — L97312 Non-pressure chronic ulcer of right ankle with fat layer exposed: Secondary | ICD-10-CM | POA: Diagnosis not present

## 2017-02-27 DIAGNOSIS — Z01818 Encounter for other preprocedural examination: Secondary | ICD-10-CM | POA: Diagnosis not present

## 2017-03-04 DIAGNOSIS — I83213 Varicose veins of right lower extremity with both ulcer of ankle and inflammation: Secondary | ICD-10-CM | POA: Diagnosis not present

## 2017-03-04 DIAGNOSIS — I96 Gangrene, not elsewhere classified: Secondary | ICD-10-CM | POA: Diagnosis not present

## 2017-03-04 DIAGNOSIS — L97312 Non-pressure chronic ulcer of right ankle with fat layer exposed: Secondary | ICD-10-CM | POA: Diagnosis not present

## 2017-03-04 DIAGNOSIS — G629 Polyneuropathy, unspecified: Secondary | ICD-10-CM | POA: Diagnosis not present

## 2017-03-04 DIAGNOSIS — Z96651 Presence of right artificial knee joint: Secondary | ICD-10-CM | POA: Diagnosis not present

## 2017-03-04 DIAGNOSIS — Z79899 Other long term (current) drug therapy: Secondary | ICD-10-CM | POA: Diagnosis not present

## 2017-03-04 DIAGNOSIS — Z7982 Long term (current) use of aspirin: Secondary | ICD-10-CM | POA: Diagnosis not present

## 2017-03-04 DIAGNOSIS — Z87891 Personal history of nicotine dependence: Secondary | ICD-10-CM | POA: Diagnosis not present

## 2017-03-04 DIAGNOSIS — S82291S Other fracture of shaft of right tibia, sequela: Secondary | ICD-10-CM | POA: Diagnosis not present

## 2017-03-04 DIAGNOSIS — Z9182 Personal history of military deployment: Secondary | ICD-10-CM | POA: Diagnosis not present

## 2017-03-04 DIAGNOSIS — L97316 Non-pressure chronic ulcer of right ankle with bone involvement without evidence of necrosis: Secondary | ICD-10-CM | POA: Diagnosis not present

## 2017-03-06 ENCOUNTER — Ambulatory Visit: Payer: Medicare Other

## 2017-03-12 DIAGNOSIS — I83209 Varicose veins of unspecified lower extremity with both ulcer of unspecified site and inflammation: Secondary | ICD-10-CM | POA: Diagnosis not present

## 2017-03-12 DIAGNOSIS — M86671 Other chronic osteomyelitis, right ankle and foot: Secondary | ICD-10-CM | POA: Diagnosis not present

## 2017-03-12 DIAGNOSIS — L97909 Non-pressure chronic ulcer of unspecified part of unspecified lower leg with unspecified severity: Secondary | ICD-10-CM | POA: Diagnosis not present

## 2017-03-12 DIAGNOSIS — Z981 Arthrodesis status: Secondary | ICD-10-CM | POA: Diagnosis not present

## 2017-03-12 DIAGNOSIS — L97312 Non-pressure chronic ulcer of right ankle with fat layer exposed: Secondary | ICD-10-CM | POA: Diagnosis not present

## 2017-04-03 ENCOUNTER — Other Ambulatory Visit: Payer: Self-pay

## 2017-04-03 DIAGNOSIS — L97312 Non-pressure chronic ulcer of right ankle with fat layer exposed: Secondary | ICD-10-CM | POA: Diagnosis not present

## 2017-04-03 DIAGNOSIS — I83209 Varicose veins of unspecified lower extremity with both ulcer of unspecified site and inflammation: Secondary | ICD-10-CM | POA: Diagnosis not present

## 2017-04-03 DIAGNOSIS — L97909 Non-pressure chronic ulcer of unspecified part of unspecified lower leg with unspecified severity: Secondary | ICD-10-CM | POA: Diagnosis not present

## 2017-04-03 DIAGNOSIS — Z981 Arthrodesis status: Secondary | ICD-10-CM | POA: Diagnosis not present

## 2017-04-03 DIAGNOSIS — M86671 Other chronic osteomyelitis, right ankle and foot: Secondary | ICD-10-CM | POA: Diagnosis not present

## 2017-04-03 MED ORDER — LORATADINE 10 MG PO TABS: 10.0000 mg | ORAL_TABLET | Freq: Every day | ORAL | 11 refills | Status: DC

## 2017-04-25 ENCOUNTER — Ambulatory Visit: Payer: Medicare Other | Admitting: Family Medicine

## 2017-05-01 DIAGNOSIS — Z981 Arthrodesis status: Secondary | ICD-10-CM | POA: Diagnosis not present

## 2017-05-01 DIAGNOSIS — L97312 Non-pressure chronic ulcer of right ankle with fat layer exposed: Secondary | ICD-10-CM | POA: Diagnosis not present

## 2017-05-01 DIAGNOSIS — M86671 Other chronic osteomyelitis, right ankle and foot: Secondary | ICD-10-CM | POA: Diagnosis not present

## 2017-05-01 DIAGNOSIS — I83213 Varicose veins of right lower extremity with both ulcer of ankle and inflammation: Secondary | ICD-10-CM | POA: Diagnosis not present

## 2017-05-01 HISTORY — PX: SKIN GRAFT: SHX250

## 2017-05-06 ENCOUNTER — Ambulatory Visit (INDEPENDENT_AMBULATORY_CARE_PROVIDER_SITE_OTHER): Payer: Medicare Other

## 2017-05-06 ENCOUNTER — Ambulatory Visit (INDEPENDENT_AMBULATORY_CARE_PROVIDER_SITE_OTHER): Payer: Medicare Other | Admitting: Family Medicine

## 2017-05-06 ENCOUNTER — Encounter: Payer: Self-pay | Admitting: Family Medicine

## 2017-05-06 VITALS — BP 142/78 | HR 80 | Temp 97.6°F | Resp 16 | Ht 70.0 in | Wt 214.0 lb

## 2017-05-06 DIAGNOSIS — Z0000 Encounter for general adult medical examination without abnormal findings: Secondary | ICD-10-CM

## 2017-05-06 DIAGNOSIS — J984 Other disorders of lung: Secondary | ICD-10-CM

## 2017-05-06 DIAGNOSIS — R350 Frequency of micturition: Secondary | ICD-10-CM

## 2017-05-06 DIAGNOSIS — I10 Essential (primary) hypertension: Secondary | ICD-10-CM

## 2017-05-06 DIAGNOSIS — N401 Enlarged prostate with lower urinary tract symptoms: Secondary | ICD-10-CM | POA: Diagnosis not present

## 2017-05-06 DIAGNOSIS — R0601 Orthopnea: Secondary | ICD-10-CM | POA: Diagnosis not present

## 2017-05-06 NOTE — Progress Notes (Signed)
Subjective:   Raymond Barnett is a 84 y.o. male who presents for Medicare Annual/Subsequent preventive examination.  Review of Systems:   Cardiac Risk Factors include: male gender;advanced age (>100men, >36 women);hypertension;obesity (BMI >30kg/m2)     Objective:    Vitals: BP (!) 142/78 (BP Location: Left Arm, Patient Position: Sitting)   Pulse 80   Temp 97.6 F (36.4 C) (Oral)   Resp 16   Ht 5\' 10"  (1.778 m)   Wt 214 lb (97.1 kg)   BMI 30.71 kg/m   Body mass index is 30.71 kg/m.  Advanced Directives 05/06/2017 08/14/2016 02/02/2016 02/03/2015  Does Patient Have a Medical Advance Directive? No No No No  Does patient want to make changes to medical advance directive? Yes (MAU/Ambulatory/Procedural Areas - Information given) - - -  Would patient like information on creating a medical advance directive? - No - Patient declined No - patient declined information No - patient declined information    Tobacco Social History   Tobacco Use  Smoking Status Former Smoker  . Packs/day: 1.00  . Years: 30.00  . Pack years: 30.00  . Types: Cigarettes  Smokeless Tobacco Former [place]   stopped at age 27; started in teens     Counseling given: Not Answered Comment: stopped at age 30; started in teens   Clinical Intake:  Pre-visit preparation completed: Yes  Pain : No/denies pain     Nutritional Status: BMI > 30  Obese Nutritional Risks: Non-healing wound(seeing Dr.Kerzner) Diabetes: No  How often do you need to have someone help you when you read instructions, pamphlets, or other written materials from your doctor or pharmacy?: 1 - Never What is the last grade level you completed in school?: high [employer]  Interpreter Needed?: No  Information entered by :: TIffany HIll,LPN   Past Medical History:  Diagnosis Date  . Arthritis   . Benign prostate hyperplasia    lower urinary tract symptoms  . Cataracts, bilateral   . DDD (degenerative disc disease),  lumbar   . Glaucoma   . H/O carpal tunnel syndrome   . History of osteomyelitis 1953   right leg  . Thrombocytopenia ([place])    Past Surgical History:  Procedure Laterality Date  . CARPAL TUNNEL RELEASE Left   . CARPAL TUNNEL RELEASE Right   . COLONOSCOPY  2015   Dr. Allen Norris  . LEG SURGERY Right 1953  . SKIN GRAFT Right 05/01/2017   [facility] foot [facility]  . TOTAL KNEE ARTHROPLASTY Right    Family History  Problem Relation Age of Onset  . Colon cancer Mother 26  . Thrombosis Father 5  . Heart disease Father   . Hypertension Sister   . Hyperlipidemia Sister    Social History   Socioeconomic History  . Marital status: Married    Spouse name: None  . Number of children: None  . Years of education: None  . Highest education level: None  Social Needs  . Financial resource strain: Not hard at all  . Food insecurity - worry: Never true  . Food insecurity - inability: Never true  . Transportation needs - medical: No  . Transportation needs - non-medical: No  Occupational History  . None  Tobacco Use  . Smoking status: Former Smoker    Packs/day: 1.00    Years: 30.00    Pack years: 30.00    Types: Cigarettes  . Smokeless tobacco: Former Systems developer  . Tobacco comment: stopped at age 28; started  in teens  Substance and Sexual Activity  . Alcohol use: Yes    Alcohol/week: 0.0 oz    Comment: wine occasionally  . Drug use: No  . Sexual activity: Not Currently  Other Topics Concern  . None  Social History Narrative  . None    Outpatient Encounter Medications as of 05/06/2017  Medication Sig  . aspirin 81 MG tablet Take 1 tablet by mouth daily.  . Calcium Carb-Cholecalciferol (CALCIUM 600 + D) 600-200 MG-UNIT TABS Take 1 tablet by mouth daily. Patient takes 1000 mg daily.  . finasteride (PROSCAR) 5 MG tablet Take 5 mg by mouth daily.  Marland Kitchen lisinopril (PRINIVIL,ZESTRIL) 5 MG tablet Take 1 tablet (5 mg total) by mouth daily.  . timolol (TIMOPTIC) 0.5 % ophthalmic solution 1 drop 2  (two) times daily.  Marland Kitchen loratadine (CLARITIN) 10 MG tablet Take 1 tablet (10 mg total) by mouth daily. (Patient not taking: Reported on 05/06/2017)  . naproxen (NAPROSYN) 500 MG tablet Take 500 mg by mouth daily as needed.  . tamsulosin (FLOMAX) 0.4 MG CAPS capsule Take 0.4 mg by mouth.  . [DISCONTINUED] Omega-3 Fatty Acids (FISH OIL) 1200 MG CAPS Take by mouth daily.   No facility-administered encounter medications on file as of 05/06/2017.     Activities of Daily Living In your present state of health, do you have any difficulty performing the following activities: 05/06/2017 09/02/2016  Hearing? N N  Vision? N N  Difficulty concentrating or making decisions? N N  Walking or climbing stairs? N Y  Dressing or bathing? N Y  Doing errands, shopping? N N  Preparing Food and eating ? N -  Using the Toilet? N -  In the past six months, have you accidently leaked urine? Y -  Do you have problems with loss of bowel control? N -  Managing your Medications? N -  Managing your Finances? N -  Housekeeping or managing your Housekeeping? N -  Some recent data might be hidden    Patient Care Team: [name], DO as PCP - General (Family Medicine) Casimer Lanius, DPM (Podiatry) Felipa Eth, MD (Internal Medicine)   Assessment:   This is a routine wellness examination for Raymond Barnett.  Exercise Activities and Dietary recommendations Current Exercise Habits: The patient does not participate in regular exercise at present, Exercise limited by: None identified  Goals    . DIET - INCREASE WATER INTAKE     Recommend drinking at least 6-8 glasses of water a day        Fall Risk Fall Risk  05/06/2017 03/05/2016 11/23/2015 05/18/2015 02/13/2015  Falls in the past year? No No No No No  Risk for fall due to : - - - Impaired balance/gait -   Is the patient's home free of loose throw rugs in walkways, pet beds, electrical cords, etc?   yes      Grab bars in the bathroom? no       Handrails on the stairs?   yes      Adequate lighting?   yes  Timed Get Up and Go Performed: completed in 10 seconds with use of cane. Steady gait. No intervention needed at this time.   Depression Screen PHQ 2/9 Scores 05/06/2017 03/05/2016 11/23/2015 05/18/2015  PHQ - 2 Score 0 0 0 3  PHQ- 9 Score - - - 9    Cognitive Function     6CIT Screen 05/06/2017  What Year? 0 points  What month? 0 points  What  time? 0 points  Count back from 20 0 points  Months in reverse 0 points  Repeat phrase 2 points  Total Score 2    Immunization History  Administered Date(s) Administered  . Influenza Whole 01/24/2010  . Influenza, High Dose Seasonal PF 01/23/2016  . Influenza,inj,Quad PF,6+ Mos 02/03/2015    Qualifies for Shingles Vaccine? Discussed shingrix vaccine  Screening Tests Health Maintenance  Topic Date Due  . TETANUS/TDAP  12/29/2022  . INFLUENZA VACCINE  Completed  . PNA vac Low Risk Adult  Completed   Cancer Screenings: Lung: Low Dose CT Chest recommended if Age 51-80 years, 30 pack-year currently smoking OR have quit w/in 15years. Patient does not qualify. Colorectal: no longer required due to age  Additional Screenings:  Hepatitis B/HIV/Syphillis: not indicated Hepatitis C Screening: not indicated    Plan:    I have personally reviewed and addressed the Medicare Annual Wellness questionnaire and have noted the following in the patient's chart:  A. Medical and social history B. Use of alcohol, tobacco or illicit drugs  C. Current medications and supplements D. Functional ability and status E.  Nutritional status F.  Physical activity G. Advance directives H. List of other physicians I.  Hospitalizations, surgeries, and ER visits in previous 12 months J.  [place] such as hearing and vision if needed, cognitive and depression L. Referrals and appointments   In addition, I have reviewed and discussed with patient certain preventive protocols, quality  metrics, and best practice recommendations. A written personalized care plan for preventive services as well as general preventive health recommendations were provided to patient.   Signed,  Tyler Aas, LPN Nurse Health Advisor   Nurse Notes: Complains of sob and wheezing at bedtime.  [facility]- states he took 2 doses and felt dizzy so he stopped taking them.

## 2017-05-06 NOTE — Patient Instructions (Addendum)
Raymond Barnett , Thank you for taking time to come for your Medicare Wellness Visit. I appreciate your ongoing commitment to your health goals. Please review the following plan we discussed and let me know if I can assist you in the future.   Screening recommendations/referrals: Colonoscopy: no longer required  Recommended yearly ophthalmology/optometry visit for glaucoma screening and checkup Recommended yearly dental visit for hygiene and checkup  Vaccinations: Influenza vaccine: up to date Pneumococcal vaccine: up to date Tdap vaccine: up to date Shingles vaccine: due, check with your insurance company for coverage    Advanced directives: Advance directive discussed with you today. I have provided a copy for you to complete at home and have notarized. Once this is complete please bring a copy in to our office so we can scan it into your chart.  Conditions/risks identified: Recommend drinking at least 6-8 glasses of water a day   Next appointment: Follow up in one year for your annual wellness exam. ains o  Preventive Care 65 Years and Older, Male Preventive care refers to lifestyle choices and visits with your health care provider that can promote health and wellness. What does preventive care include?  A yearly physical exam. This is also called an annual well check.  Dental exams once or twice a year.  Routine eye exams. Ask your health care provider how often you should have your eyes checked.  Personal lifestyle choices, including:  Daily care of your teeth and gums.  Regular physical activity.  Eating a healthy diet.  Avoiding tobacco and drug use.  Limiting alcohol use.  Practicing safe sex.  Taking low doses of aspirin every day.  Taking vitamin and mineral supplements as recommended by your health care provider. What happens during an annual well check? The services and screenings done by your health care provider during your annual well check will depend on  your age, overall health, lifestyle risk factors, and family history of disease. Counseling  Your health care provider may ask you questions about your:  Alcohol use.  Tobacco use.  Drug use.  Emotional well-being.  Home and relationship well-being.  Sexual activity.  Eating habits.  History of falls.  Memory and ability to understand (cognition).  Work and work Statistician. Screening  You may have the following tests or measurements:  Height, weight, and BMI.  Blood pressure.  Lipid and cholesterol levels. These may be checked every 5 years, or more frequently if you are over 2 years old.  Skin check.  Lung cancer screening. You may have this screening every year starting at age 73 if you have a 30-pack-year history of smoking and currently smoke or have quit within the past 15 years.  Fecal occult blood test (FOBT) of the stool. You may have this test every year starting at age 2.  Flexible sigmoidoscopy or colonoscopy. You may have a sigmoidoscopy every 5 years or a colonoscopy every 10 years starting at age 78.  Prostate cancer screening. Recommendations will vary depending on your family history and other risks.  Hepatitis C blood test.  Hepatitis B blood test.  Sexually transmitted disease (STD) testing.  Diabetes screening. This is done by checking your blood sugar (glucose) after you have not eaten for a while (fasting). You may have this done every 1-3 years.  Abdominal aortic aneurysm (AAA) screening. You may need this if you are a current or former smoker.  Osteoporosis. You may be screened starting at age 93 if you are at high risk.  Talk with your health care provider about your test results, treatment options, and if necessary, the need for more tests. Vaccines  Your health care provider may recommend certain vaccines, such as:  Influenza vaccine. This is recommended every year.  Tetanus, diphtheria, and acellular pertussis (Tdap, Td) vaccine.  You may need a Td booster every 10 years.  Zoster vaccine. You may need this after age 83.  Pneumococcal 13-valent conjugate (PCV13) vaccine. One dose is recommended after age 42.  Pneumococcal polysaccharide (PPSV23) vaccine. One dose is recommended after age 66. Talk to your health care provider about which screenings and vaccines you need and how often you need them. This information is not intended to replace advice given to you by your health care provider. Make sure you discuss any questions you have with your health care provider. Document Released: 05/05/2015 Document Revised: 12/27/2015 Document Reviewed: 02/07/2015 Elsevier Interactive Patient Education  2017 [place] Prevention in the Home Falls can cause injuries. They can happen to people of all ages. There are many things you can do to make your home safe and to help prevent falls. What can I do on the outside of my home?  Regularly fix the edges of walkways and driveways and fix any cracks.  Remove anything that might make you trip as you walk through a door, such as a raised step or threshold.  Trim any bushes or trees on the path to your home.  Use bright outdoor lighting.  Clear any walking paths of anything that might make someone trip, such as rocks or tools.  Regularly check to see if handrails are loose or broken. Make sure that both sides of any steps have handrails.  Any raised decks and porches should have guardrails on the edges.  Have any leaves, snow, or ice cleared regularly.  Use sand or salt on walking paths during winter.  Clean up any spills in your garage right away. This includes oil or grease spills. What can I do in the bathroom?  Use night lights.  Install grab bars by the toilet and in the tub and shower. Do not use towel bars as grab bars.  Use non-skid mats or decals in the tub or shower.  If you need to sit down in the shower, use a plastic, non-slip stool.  Keep the  floor dry. Clean up any water that spills on the floor as soon as it happens.  Remove soap buildup in the tub or shower regularly.  Attach bath mats securely with double-sided non-slip rug tape.  Do not have throw rugs and other things on the floor that can make you trip. What can I do in the bedroom?  Use night lights.  Make sure that you have a light by your bed that is easy to reach.  Do not use any sheets or blankets that are too big for your bed. They should not hang down onto the floor.  Have a firm chair that has side arms. You can use this for support while you get dressed.  Do not have throw rugs and other things on the floor that can make you trip. What can I do in the kitchen?  Clean up any spills right away.  Avoid walking on wet floors.  Keep items that you use a lot in easy-to-reach places.  If you need to reach something above you, use a strong step stool that has a grab bar.  Keep electrical cords out of the way.  Do not use floor polish or wax that makes floors slippery. If you must use wax, use non-skid floor wax.  Do not have throw rugs and other things on the floor that can make you trip. What can I do with my stairs?  Do not leave any items on the stairs.  Make sure that there are handrails on both sides of the stairs and use them. Fix handrails that are broken or loose. Make sure that handrails are as long as the stairways.  Check any carpeting to make sure that it is firmly attached to the stairs. Fix any carpet that is loose or worn.  Avoid having throw rugs at the top or bottom of the stairs. If you do have throw rugs, attach them to the floor with carpet tape.  Make sure that you have a light switch at the top of the stairs and the bottom of the stairs. If you do not have them, ask someone to add them for you. What else can I do to help prevent falls?  Wear shoes that:  Do not have high heels.  Have rubber bottoms.  Are comfortable and fit  you well.  Are closed at the toe. Do not wear sandals.  If you use a stepladder:  Make sure that it is fully opened. Do not climb a closed stepladder.  Make sure that both sides of the stepladder are locked into place.  Ask someone to hold it for you, if possible.  Clearly mark and make sure that you can see:  Any grab bars or handrails.  First and last steps.  Where the edge of each step is.  Use tools that help you move around (mobility aids) if they are needed. These include:  Canes.  Walkers.  Scooters.  Crutches.  Turn on the lights when you go into a dark area. Replace any light bulbs as soon as they burn out.  Set up your furniture so you have a clear path. Avoid moving your furniture around.  If any of your floors are uneven, fix them.  If there are any pets around you, be aware of where they are.  Review your medicines with your doctor. Some medicines can make you feel dizzy. This can increase your chance of falling. Ask your doctor what other things that you can do to help prevent falls. This information is not intended to replace advice given to you by your health care provider. Make sure you discuss any questions you have with your health care provider. Document Released: 02/02/2009 Document Revised: 09/14/2015 Document Reviewed: 05/13/2014 Elsevier Interactive Patient Education  2017 [employer].

## 2017-05-06 NOTE — Patient Instructions (Addendum)
Thank you for coming to the office today.  1. Please call your previous doctor, Dr Tish Men about re-scheduling the Chest CT scan that is already ordered for the lung abnormality, they have the order ready still, and should be able to be scheduled.  If you need [place] to re-order it we could do that  HEMATOLOGY / [place] at [facility] (Hematology/Oncology) [address] [place], [place] [address] Phone: [phone]  After scan, we will learn more about what the lung problem is, if it is related to fibrosis you may need other inhalers.  2. Stop Flomax as discussed  3. Continue BP medication  I would recommend referral to Urology for Urodynamic testing - to determine cause, more than enlarged prostate most likely  [facility] -[address],  [place]  [address] Phone: [phone]  Please schedule a Follow-up Appointment to: Return in about 6 weeks (around 06/17/2017) for dyspnea, CT results, Urology referral?.   If you have any other questions or concerns, please feel free to call the office or send a message through [place]. You may also schedule an earlier appointment if necessary.  Additionally, you may be receiving a survey about your experience at our office within a few days to 1 week by e-mail or mail. We value your feedback.  Nobie Putnam, DO [place]

## 2017-05-06 NOTE — Progress Notes (Signed)
Subjective:    Patient ID: Raymond Barnett, male    DOB: 06-Sep-1929, 84 y.o.   MRN: [id number]  Raymond Barnett is a 84 y.o. male presenting on 05/06/2017 for Hypertension   HPI   CHRONIC HTN: Reports improved, checks occasionally at home Current Meds - Lisinopril 5mg    Reports good compliance, took meds today. Tolerating well, w/o complaints. Denies CP, dyspnea, HA, edema, dizziness / lightheadedness  BPH / Nocturia LUTS Reports he has chronic history of BPH. He has been on Finasteride with good results, this was prescribed by Primary at [facility]. He has not seen a Dealer. This is new topic of discussion today, has not discussed at previous visits. - Recently seen again by [place] doctor due to nocturia, he was prescribed Tamsulosin 0.4mg  daily back in December 2018, he tried 2 doses and had problems with side effects, constant dizziness and felt lightheaded, he stopped taking med and symptoms resolved - He is asking what other med he can take or what to do - he complains of having increased urination especially since more sedentary now with ankle, when he gets up he has to void, long distance to bathroom, he is considering depends, but denies significant incontinence  AUA BPH Symptom Score over past 1 month 1. Sensation of not emptying bladder post void - 0 2. Urinate less than 2 hour after finish last void - 4 3. Start/Stop several times during void - 0 4. Difficult to postpone urination - 5 5. Weak urinary stream - 0 6. Push or strain urination - 0 7. Nocturia - 3 times  Total Score: 13 (Moderate BPH symptoms)  ADDITIONAL complaints / updates:  Chronic Ulcer Right, lower extremity ankle - Followed by [facility], Dr Governor Specking. He has recently had a skin graft procedure.  Dyspnea vs Orthopnea He reports a persistent symptom, uncertain exact onset, within last 3-6 months it seems of some dyspnea only present when he lays down feels like can't catch breath as well does not seem  to wake up from it, he had prior Chest X-ray in 01/2017, showed some bibasilar atelectasis vs scaring vs lung density, he was to follow-up with Dr Rogue Bussing and had a Chest CT ordered that was re-scheduled and then never completed. - He has not had ECHO or other history of cardiac disease to his knowledge. He has had EKG at [facility] states he was told there was an abnormality on it but he cannot explain what it was. Chart review shows EKG 02/27/17 per [facility] outside [facility] record, no image but report shows RBBB - Denies chest pain or other exertional symptoms  Depression screen [facility] 2/9 05/06/2017 05/06/2017 03/05/2016  Decreased Interest 0 0 0  Down, Depressed, Hopeless 0 0 0  PHQ - 2 Score 0 0 0  Altered sleeping - - -  Tired, decreased energy - - -  Change in appetite - - -  Feeling bad or failure about yourself  - - -  Trouble concentrating - - -  Moving slowly or fidgety/restless - - -  Suicidal thoughts - - -  PHQ-9 Score - - -  Difficult doing work/chores - - -    Social History   Tobacco Use  . Smoking status: Former Smoker    Packs/day: 1.00    Years: 30.00    Pack years: 30.00    Types: Cigarettes  . Smokeless tobacco: Former Systems developer  . Tobacco comment: stopped at age 89; started in teens  Substance Use  Topics  . Alcohol use: Yes    Alcohol/week: 0.0 oz    Comment: wine occasionally  . Drug use: No    Review of Systems Per HPI unless specifically indicated above     Objective:    BP (!) 142/78   Pulse 80   Temp 97.6 F (36.4 C) (Oral)   Resp 16   Ht 5\' 10"  (1.778 m)   Wt 214 lb (97.1 kg)   SpO2 98%   BMI 30.71 kg/m   Wt Readings from Last 3 Encounters:  05/06/17 214 lb (97.1 kg)  05/06/17 214 lb (97.1 kg)  02/12/17 215 lb (97.5 kg)    Physical Exam  Constitutional: He is oriented to person, place, and time. He appears well-developed and well-nourished. No distress.  Well-appearing, comfortable, cooperative  HENT:  Head: Normocephalic and atraumatic.    Mouth/Throat: Oropharynx is clear and moist.  Eyes: Conjunctivae are normal. Right eye exhibits no discharge. Left eye exhibits no discharge.  Neck: Normal range of motion. Neck supple. No thyromegaly present.  Cardiovascular: Normal rate, regular rhythm, normal heart sounds and intact distal pulses.  No murmur heard. Pulmonary/Chest: Effort normal and breath sounds normal. No respiratory distress. He has no wheezes. He has no rales.  Good air movement. Speaks full sentences. No coughing.  Musculoskeletal: Normal range of motion. He exhibits no edema.  Lymphadenopathy:    He has no cervical adenopathy.  Neurological: He is alert and oriented to person, place, and time.  Skin: Skin is warm and dry. No rash noted. He is not diaphoretic. No erythema.  Right lower extremity chronic ulceration  Psychiatric: He has a normal mood and affect. His behavior is normal.  Well groomed, good eye contact, normal speech and thoughts  Nursing note and vitals reviewed.  Results for orders placed or performed in visit on 02/12/17  CBC with Differential/Platelet  Result Value Ref Range   WBC 7.1 3.8 - 10.6 K/uL   RBC 4.07 (L) 4.40 - 5.90 MIL/uL   Hemoglobin 13.4 13.0 - 18.0 g/dL   HCT 39.7 (L) 40.0 - 52.0 %   MCV 97.6 80.0 - 100.0 fL   MCH 32.9 26.0 - 34.0 pg   MCHC 33.7 32.0 - 36.0 g/dL   RDW 16.7 (H) 11.5 - 14.5 %   Platelets 124 (L) 150 - 440 K/uL   Neutrophils Relative % 61 %   Lymphocytes Relative 28 %   Monocytes Relative 9 %   Eosinophils Relative 2 %   Basophils Relative 0 %   Neutro Abs 4.4 1.4 - 6.5 K/uL   Lymphs Abs 2.0 1.0 - 3.6 K/uL   Monocytes Absolute 0.6 0.2 - 1.0 K/uL   Eosinophils Absolute 0.1 0 - 0.7 K/uL   Basophils Absolute 0.0 0 - 0.1 K/uL   Smear Review SMEAR SCANNED   Basic metabolic panel  Result Value Ref Range   Sodium 136 135 - 145 mmol/L   Potassium 4.3 3.5 - 5.1 mmol/L   Chloride 103 101 - 111 mmol/L   CO2 24 22 - 32 mmol/L   Glucose, Bld 88 65 - 99 mg/dL    BUN 16 6 - 20 mg/dL   Creatinine, Ser 0.81 0.61 - 1.24 mg/dL   Calcium 9.5 8.9 - 10.3 mg/dL   GFR calc non Af Amer >60 >60 mL/min   GFR calc Af Amer >60 >60 mL/min   Anion gap 9 5 - 15      Assessment & Plan:   Problem List  Items Addressed This Visit    Benign prostatic hyperplasia with urinary frequency    Stable chronic BPH with nocturia, new problem to me, [facility] - [facility] BPH score 13 (moderate, no comparison score), improved on finasteride - On Finasteride 5mg , failed Tamsulosin 0.4mg  daily (x 2 doses dizziness) - Last PSA result not available, likely [facility] - No known personal/family history of prostate CA  Plan: 1. Discussed management of BPH - agree that since cannot tolerate Tamsulosin low dose (STOP this med) - will hold trial of other alpha blockers for now, can reconsider doxazosin possibly. Continue Finasteride 5mg  daily per [facility] - Referral to Urology B[facility] for urodynamic testing to determine better if his LUTS are primarily BPH or if other bladder etiology is contributing with some urgency as well, may benefit from other therapy      Relevant Orders   Ambulatory referral to Urology   Essential hypertension - Primary    Mildly elevated BP today, improve on re-check - Home BP readings normal, reviewed today  No known complications   Plan:  1. Continue current BP regimen - Lisinopril 5mg  daily 2. Encourage improved lifestyle - low sodium diet, regular exercise 3. Continue monitor BP outside office, infrequent readings now, if persistently >140/90 or new symptoms notify office sooner 4. Follow-up 6 weeks      Lung density on x-ray    On prior X-ray imaging, was advised to have neck X-ray vs Chest CT He had CT Chest ordered by Dr Rogue Bussing but the patient re-scheduled and canceled this Now he is requesting new CT Chest order He has some mild dyspnea in evening when laying down, not entirely consistent with orthopnea and no other pulmonary findings on exam,  pulse ox 98%  I advised him that to start since Dr Tish Men already has order placed for Chest CT, it would be preferable for him to contact their office or imaging center and re-schedule this test - as opposed to canceling their order and re-ordering a new one due to coverage/approval process. He did not quite understand this. But agreed to contact them to schedule, if he is unable to do so, and their office requests that we order it, I could place a new order for Chest CT - After this imaging is reviewed we can consider further work-up with possible ECHO or may need pulmonary for PFTs pending result - Return in 6 weeks for follow-up results       Other Visit Diagnoses    Orthopnea          No orders of the defined types were placed in this encounter.  Orders Placed This Encounter  Procedures  . Ambulatory referral to Urology    Referral Priority:   Routine    Referral Type:   Consultation    Referral Reason:   Specialty Services Required    Requested Specialty:   Urology    Number of Visits Requested:   1     Follow up plan: Return in about 6 weeks (around 06/17/2017) for dyspnea, CT results, Urology referral?.  Nobie Putnam, [facility] 05/07/2017, 8:32 AM

## 2017-05-07 NOTE — Assessment & Plan Note (Signed)
Mildly elevated BP today, improve on re-check - Home BP readings normal, reviewed today  No known complications   Plan:  1. Continue current BP regimen - Lisinopril 5mg  daily 2. Encourage improved lifestyle - low sodium diet, regular exercise 3. Continue monitor BP outside office, infrequent readings now, if persistently >140/90 or new symptoms notify office sooner 4. Follow-up 6 weeks

## 2017-05-07 NOTE — Assessment & Plan Note (Addendum)
Stable chronic BPH with nocturia, new problem to me, [facility] - [facility] BPH score 13 (moderate, no comparison score), improved on finasteride - On Finasteride 5mg , failed Tamsulosin 0.4mg  daily (x 2 doses dizziness) - Last PSA result not available, likely [facility] - No known personal/family history of prostate CA  Plan: 1. Discussed management of BPH - agree that since cannot tolerate Tamsulosin low dose (STOP this med) - will hold trial of other alpha blockers for now, can reconsider doxazosin possibly. Continue Finasteride 5mg  daily per [facility] - Referral to Urology [facility] for urodynamic testing to determine better if his LUTS are primarily BPH or if other bladder etiology is contributing with some urgency as well, may benefit from other therapy

## 2017-05-07 NOTE — Assessment & Plan Note (Signed)
On prior X-ray imaging, was advised to have neck X-ray vs Chest CT He had CT Chest ordered by Dr Rogue Bussing but the patient re-scheduled and canceled this Now he is requesting new CT Chest order He has some mild dyspnea in evening when laying down, not entirely consistent with orthopnea and no other pulmonary findings on exam, pulse ox 98%  I advised him that to start since Dr Tish Men already has order placed for Chest CT, it would be preferable for him to contact their office or imaging center and re-schedule this test - as opposed to canceling their order and re-ordering a new one due to coverage/approval process. He did not quite understand this. But agreed to contact them to schedule, if he is unable to do so, and their office requests that we order it, I could place a new order for Chest CT - After this imaging is reviewed we can consider further work-up with possible ECHO or may need pulmonary for PFTs pending result - Return in 6 weeks for follow-up results

## 2017-05-13 ENCOUNTER — Encounter: Payer: Self-pay | Admitting: Urology

## 2017-05-13 ENCOUNTER — Ambulatory Visit (INDEPENDENT_AMBULATORY_CARE_PROVIDER_SITE_OTHER): Payer: Medicare Other | Admitting: Urology

## 2017-05-13 VITALS — BP 139/82 | HR 80 | Ht 70.0 in | Wt 217.2 lb

## 2017-05-13 DIAGNOSIS — R350 Frequency of micturition: Secondary | ICD-10-CM | POA: Diagnosis not present

## 2017-05-13 DIAGNOSIS — N401 Enlarged prostate with lower urinary tract symptoms: Secondary | ICD-10-CM

## 2017-05-13 LAB — BLADDER SCAN AMB NON-IMAGING: Scan Result: 0

## 2017-05-13 MED ORDER — SILODOSIN 4 MG PO CAPS
4.0000 mg | ORAL_CAPSULE | Freq: Every day | ORAL | 1 refills | Status: DC
Start: 2017-05-13 — End: 2017-06-18

## 2017-05-13 NOTE — Progress Notes (Signed)
05/13/2017 1:15 PM   Raymond Barnett Dec 01, 1929 [id number]  Referring provider: [name], DO [address] [place], [place] [address]  Chief Complaint  Patient presents with  . Benign Prostatic Hypertrophy    HPI: Raymond Barnett is an 84 year old male referred for evaluation of lower urinary tract symptoms.  He has a long history of BPH with lower urinary tract symptoms and has been followed by his primary provider at the [place].  He has been on finasteride for several years.  He recently noted worsening lower urinary tract symptoms and was started on tamsulosin in December 2019.  He felt his voiding symptoms improved however he could not tolerate secondary to light headedness. He complains of urinary frequency, urgency and nocturia-1-3.  He denies dysuria or gross hematuria.  Denies flank, abdominal, pelvic or scrotal pain.  He has not seen a urologist previously.  PMH: Past Medical History:  Diagnosis Date  . Arthritis   . Benign prostate hyperplasia    lower urinary tract symptoms  . Cataracts, bilateral   . DDD (degenerative disc disease), lumbar   . Glaucoma   . H/O carpal tunnel syndrome   . History of osteomyelitis 1953   right leg  . Thrombocytopenia [facility])     Surgical History: Past Surgical History:  Procedure Laterality Date  . CARPAL TUNNEL RELEASE Left   . CARPAL TUNNEL RELEASE Right   . COLONOSCOPY  2015   Dr. Allen Norris  . LEG SURGERY Right 1953  . SKIN GRAFT Right 05/01/2017   right foot at [facility]  . TOTAL KNEE ARTHROPLASTY Right     Home Medications:  Allergies as of 05/13/2017   No Known Allergies     Medication List        Accurate as of 05/13/17  1:15 PM. Always use your most recent med list.          aspirin 81 MG tablet Take 1 tablet by mouth daily.   CALCIUM 600 + D 600-200 MG-UNIT Tabs Generic drug:  Calcium Carb-Cholecalciferol Take 1 tablet by mouth daily. Patient takes 1000 mg daily.   finasteride 5 MG tablet Commonly known as:   PROSCAR Take 5 mg by mouth daily.   lisinopril 5 MG tablet Commonly known as:  PRINIVIL,ZESTRIL Take 1 tablet (5 mg total) by mouth daily.   loratadine 10 MG tablet Commonly known as:  CLARITIN Take 1 tablet (10 mg total) by mouth daily.   naproxen 500 MG tablet Commonly known as:  NAPROSYN Take 500 mg by mouth daily as needed.   timolol 0.5 % ophthalmic solution Commonly known as:  TIMOPTIC 1 drop 2 (two) times daily.       Allergies: No Known Allergies  Family History: Family History  Problem Relation Age of Onset  . Colon cancer Mother 78  . Thrombosis Father 70  . Heart disease Father   . Hypertension Sister   . Hyperlipidemia Sister     Social History:  reports that he has quit smoking. His smoking use included cigarettes. He has a 30.00 pack-year smoking history. He has quit using smokeless tobacco. He reports that he drinks alcohol. He reports that he does not use drugs.  ROS: UROLOGY Frequent Urination?: Yes Hard to postpone urination?: Yes Burning/pain with urination?: No Get up at night to urinate?: Yes Leakage of urine?: No Urine stream starts and stops?: No Trouble starting stream?: No Do you have to strain to urinate?: No Blood in urine?: No Urinary tract infection?: No  Sexually transmitted disease?: No Injury to kidneys or bladder?: No Painful intercourse?: No Weak stream?: No Erection problems?: No Penile pain?: No  Gastrointestinal Nausea?: No Vomiting?: No Indigestion/heartburn?: No Diarrhea?: Yes Constipation?: No  Constitutional Fever: No Night sweats?: No Weight loss?: No Fatigue?: No  Skin Skin rash/lesions?: No Itching?: No  Eyes Blurred vision?: No Double vision?: No  Ears/Nose/Throat Sore throat?: No Sinus problems?: No  Hematologic/Lymphatic Swollen glands?: No Easy bruising?: No  Cardiovascular Leg swelling?: No Chest pain?: No  Respiratory Cough?: No Shortness of breath?: No  Endocrine Excessive  thirst?: No  Musculoskeletal Back pain?: Yes Joint pain?: Yes  Neurological Headaches?: No Dizziness?: Yes  Psychologic Depression?: No Anxiety?: No  Physical Exam: BP 139/82 (BP Location: Left Arm, Patient Position: Sitting, Cuff Size: Large)   Pulse 80   Ht 5\' 10"  (1.778 m)   Wt 217 lb 3.2 oz (98.5 kg)   BMI 31.16 kg/m   Constitutional:  Alert and oriented, No acute distress. HEENT: [place] AT, moist mucus membranes.  Trachea midline, no masses. Cardiovascular: No clubbing, cyanosis, or edema. Respiratory: Normal respiratory effort, no increased work of breathing. GI: Abdomen is soft, nontender, nondistended, no abdominal masses GU: No CVA tenderness.  Prostate 35 g, smooth without nodules Skin: No rashes, bruises or suspicious lesions. Lymph: No cervical or inguinal adenopathy. Neurologic: Grossly intact, no focal deficits, moving all 4 extremities. Psychiatric: Normal mood and affect.  Laboratory Data: Lab Results  Component Value Date   WBC 7.1 02/12/2017   HGB 13.4 02/12/2017   HCT 39.7 (L) 02/12/2017   MCV 97.6 02/12/2017   PLT 124 (L) 02/12/2017    Lab Results  Component Value Date   CREATININE 0.81 02/12/2017    Lab Results  Component Value Date   HGBA1C 5.0 07/04/2016    Urinalysis Lab Results  Component Value Date   APPEARANCEUR Clear 12/12/2011   LEUKOCYTESUR Negative 12/12/2011   PROTEINUR Negative 12/12/2011   GLUCOSEU Negative 12/12/2011   RBCU <1 /HPF 12/12/2011   BILIRUBINUR Negative 12/12/2011   NITRITE Negative 12/12/2011    Lab Results  Component Value Date   BACTERIA NONE SEEN 12/12/2011    Assessment & Plan:   He noted significant improvement in his voiding pattern on tamsulosin however could not tolerate secondary to side effects.  He states he would be satisfied with his voiding pattern on this medication if it were not for the side effects.  PVR by bladder scan today was 0 mL.  Will give a trial of a prostate-specific  alpha-blocker and Rx silodosin was sent to his pharmacy.  Follow-up 4 weeks for symptom reassessment.  1. Benign prostatic hyperplasia with urinary frequency   - Bladder Scan (Post Void Residual) in office   Abbie Sons, MD  [facility] [address] [place], [place] [address] [phone]

## 2017-05-14 ENCOUNTER — Encounter: Payer: Self-pay | Admitting: Urology

## 2017-05-19 ENCOUNTER — Ambulatory Visit
Admission: RE | Admit: 2017-05-19 | Discharge: 2017-05-19 | Disposition: A | Discharge status: Home / Self Care | Payer: Medicare Other | Source: Ambulatory Visit | Attending: Nurse Practitioner | Admitting: Nurse Practitioner

## 2017-05-19 DIAGNOSIS — R918 Other nonspecific abnormal finding of lung field: Secondary | ICD-10-CM | POA: Diagnosis present

## 2017-05-19 DIAGNOSIS — I2510 Atherosclerotic heart disease of native coronary artery without angina pectoris: Secondary | ICD-10-CM | POA: Diagnosis not present

## 2017-05-19 DIAGNOSIS — R932 Abnormal findings on diagnostic imaging of liver and biliary tract: Secondary | ICD-10-CM | POA: Diagnosis not present

## 2017-05-19 DIAGNOSIS — J438 Other emphysema: Secondary | ICD-10-CM | POA: Insufficient documentation

## 2017-05-19 DIAGNOSIS — I700 Atherosclerosis of aorta: Secondary | ICD-10-CM | POA: Insufficient documentation

## 2017-05-19 DIAGNOSIS — J439 Emphysema, unspecified: Secondary | ICD-10-CM | POA: Diagnosis not present

## 2017-05-19 DIAGNOSIS — J984 Other disorders of lung: Secondary | ICD-10-CM

## 2017-05-19 DIAGNOSIS — I708 Atherosclerosis of other arteries: Secondary | ICD-10-CM | POA: Insufficient documentation

## 2017-05-19 HISTORY — DX: Essential (primary) hypertension: I10

## 2017-05-19 LAB — POCT I-STAT CREATININE: Creatinine, Ser: 1 mg/dL (ref 0.61–1.24)

## 2017-05-19 MED ORDER — IOPAMIDOL (ISOVUE-300) INJECTION 61%
75.0000 mL | Freq: Once | INTRAVENOUS | Status: AC | PRN
  Administered 2017-05-19: 75 mL via INTRAVENOUS

## 2017-05-28 DIAGNOSIS — L97312 Non-pressure chronic ulcer of right ankle with fat layer exposed: Secondary | ICD-10-CM | POA: Diagnosis not present

## 2017-05-28 DIAGNOSIS — I83013 Varicose veins of right lower extremity with ulcer of ankle: Secondary | ICD-10-CM | POA: Diagnosis not present

## 2017-05-28 DIAGNOSIS — Z981 Arthrodesis status: Secondary | ICD-10-CM | POA: Diagnosis not present

## 2017-05-28 DIAGNOSIS — M86671 Other chronic osteomyelitis, right ankle and foot: Secondary | ICD-10-CM | POA: Diagnosis not present

## 2017-06-10 ENCOUNTER — Ambulatory Visit: Payer: Medicare Other | Admitting: Urology

## 2017-06-17 ENCOUNTER — Ambulatory Visit: Payer: Medicare Other | Admitting: Family Medicine

## 2017-06-18 ENCOUNTER — Ambulatory Visit (INDEPENDENT_AMBULATORY_CARE_PROVIDER_SITE_OTHER): Payer: Medicare Other | Admitting: Family Medicine

## 2017-06-18 ENCOUNTER — Encounter: Payer: Self-pay | Admitting: Family Medicine

## 2017-06-18 VITALS — BP 144/74 | HR 79 | Temp 98.2°F | Resp 16 | Ht 70.0 in | Wt 216.0 lb

## 2017-06-18 DIAGNOSIS — R0601 Orthopnea: Secondary | ICD-10-CM

## 2017-06-18 DIAGNOSIS — R350 Frequency of micturition: Secondary | ICD-10-CM | POA: Diagnosis not present

## 2017-06-18 DIAGNOSIS — N401 Enlarged prostate with lower urinary tract symptoms: Secondary | ICD-10-CM | POA: Diagnosis not present

## 2017-06-18 DIAGNOSIS — J438 Other emphysema: Secondary | ICD-10-CM | POA: Insufficient documentation

## 2017-06-18 DIAGNOSIS — J432 Centrilobular emphysema: Secondary | ICD-10-CM | POA: Diagnosis not present

## 2017-06-18 DIAGNOSIS — R0600 Dyspnea, unspecified: Secondary | ICD-10-CM | POA: Diagnosis not present

## 2017-06-18 DIAGNOSIS — K649 Unspecified hemorrhoids: Secondary | ICD-10-CM | POA: Diagnosis not present

## 2017-06-18 MED ORDER — LIDOCAINE-HYDROCORTISONE ACE 2-2 % RE KIT: PACK | RECTAL | 3 refills | Status: DC

## 2017-06-18 NOTE — Assessment & Plan Note (Signed)
Stable w/o flare Trial on Lidocaine suppository gel kit, which has improved before - empiric rx for future use

## 2017-06-18 NOTE — Progress Notes (Signed)
Subjective:    Patient ID: Raymond Barnett, male    DOB: March 01, 1930, 84 y.o.   MRN: [id number]  Raymond Barnett is a 84 y.o. male presenting on 06/18/2017 for Shortness of Breath (CT result)  Patient provides most of history, also accompanied by his wife, Raymond Barnett who provides history.  HPI   FOLLOW-UP Orthopnea / Dyspnea / Abnormal CXR - CT Results - Last visit with me 05/06/17, for same problem after had prior x-ray with abnormality lung nodule vs density and recent worsening symptoms dyspnea/orthopnea, treated with Chest CT for further eval was prompted by Oncology but he states was not following him for this, see prior notes for background information. - Interval update with had Chest CT done 05/19/17, showed COPD emphysema and some possible early pulmonary fibrosis without other lung abnormality, no nodule - Today patient reports same symptoms without worsen or improvement, still some dyspnea if lay flat, seems positional at times, not necessarily endorsing exertional symptoms - No prior dx COPD - He thinks he is deconditioned some as well - He is former smoker - He has not had ECHO or other history of cardiac disease to his knowledge. He has had EKG [facility] states he was told there was an abnormality on it but he cannot explain what it was. Chart review shows EKG 02/27/17 per [facility] record, no image but report shows RBBB - Denies chest pain or other exertional symptoms   FOLLOW-UP BPH / Nocturia LUTS - Last visit with me 05/06/17, for follow-up visit for same problem BPH, treated with referral to [facility] since unable to tolerate Tamsulosin but had good results, see prior notes for background information. - Interval update with established with Urology Dr Bernardo Heater on 05/13/17, ultimately he had PVR 0 mL and was switched to Silodosin 15m daily - however he states never got rx due to never being notified by pharmacy or Urology, says he thinks the cost was too much, but he was  not told the price. He thinks it was not covered. He did not call them to notify this - He is comfortable with current symptoms seems improved on Finasteride and remains off Flomax, not interested if will get side effects - Last [facility] BPH score 13 copied below:  05/06/17 [facility] BPH Symptom Score over past 1 month 1. Sensation of not emptying bladder post void - 0 2. Urinate less than 2 hour after finish last void - 4 3. Start/Stop several times during void - 0 4. Difficult to postpone urination - 5 5. Weak urinary stream - 0 6. Push or strain urination - 0 7. Nocturia - 3 times  Total Score: 13 (Moderate BPH symptoms)  ADDITIONAL complaints / updates: Hemorrhoids - requesting re-order/refill on prior medicine he used topical lidocaine suppository kit, previous rx per Dr WAllen NorrisGI, now he requests new one, no hemorrhoids actively today but has had flares in past with good results   [facility] 05/06/2017 05/06/2017 03/05/2016  Decreased Interest 0 0 0  Down, Depressed, Hopeless 0 0 0  PHQ - 2 Score 0 0 0  Altered sleeping - - -  Tired, decreased energy - - -  Change in appetite - - -  Feeling bad or failure about yourself  - - -  Trouble concentrating - - -  Moving slowly or fidgety/restless - - -  Suicidal thoughts - - -  PHQ-9 Score - - -  Difficult doing work/chores - - -  Social History   Tobacco Use  . Smoking status: Former Smoker    Packs/day: 1.00    Years: 30.00    Pack years: 30.00    Types: Cigarettes  . Smokeless tobacco: Former Systems developer  . Tobacco comment: stopped at age 64; started in teens  Substance Use Topics  . Alcohol use: Yes    Alcohol/week: 0.0 oz    Comment: wine occasionally  . Drug use: No    Review of Systems Per HPI unless specifically indicated above     Objective:    BP (!) 144/74   Pulse 79   Temp 98.2 F (36.8 C) (Oral)   Resp 16   Ht '5\' 10"'  (1.778 m)   Wt 216 lb (98 kg)   SpO2 99%   BMI 30.99 kg/m   Wt Readings  from Last 3 Encounters:  06/18/17 216 lb (98 kg)  05/13/17 217 lb 3.2 oz (98.5 kg)  05/06/17 214 lb (97.1 kg)    Physical Exam  Constitutional: He is oriented to person, place, and time. He appears well-developed and well-nourished. No distress.  Well-appearing, comfortable, cooperative  HENT:  Head: Normocephalic and atraumatic.  Mouth/Throat: Oropharynx is clear and moist.  Eyes: Conjunctivae are normal. Right eye exhibits no discharge. Left eye exhibits no discharge.  Neck: Normal range of motion. Neck supple. No thyromegaly present.  Cardiovascular: Normal rate, regular rhythm, normal heart sounds and intact distal pulses.  No murmur heard. Pulmonary/Chest: Effort normal and breath sounds normal. No respiratory distress. He has no wheezes. He has no rales.  Good air movement. Speaks full sentences. No coughing.  Musculoskeletal: Normal range of motion. He exhibits no edema.  Lymphadenopathy:    He has no cervical adenopathy.  Neurological: He is alert and oriented to person, place, and time.  Skin: Skin is warm and dry. No rash noted. He is not diaphoretic. No erythema.  Psychiatric: He has a normal mood and affect. His behavior is normal.  Well groomed, good eye contact, normal speech and thoughts  Nursing note and vitals reviewed.    I have personally reviewed the radiology report from 05/19/17 Chest CT w Contrast.  CLINICAL DATA: 84 year old male with cough and shortness of breath. Former smoker, but quit many years ago. Patchy lung base opacity on chest radiographs in October.  EXAM: CT CHEST WITH CONTRAST  TECHNIQUE: Multidetector CT imaging of the chest was performed during intravenous contrast administration.  CONTRAST: 58m ISOVUE-300 IOPAMIDOL (ISOVUE-300) INJECTION 61%  COMPARISON: Chest radiographs 02/12/2017  FINDINGS: Cardiovascular: Calcified coronary artery atherosclerosis. Soft and calcified plaque in the thoracic aorta, at the arch, and at  the proximal great vessels. This includes estimated 60-70% stenosis at the left subclavian artery origin (series 4, image 41). There is more substantial appearing soft and calcified plaque in the visible proximal abdominal aorta at the renal artery levels (series 4, image 166).  Other major mediastinal vasculature is enhancing and appears patent. Mild cardiomegaly. No pericardial effusion.  Mediastinum/Nodes: Mediastinal and hilar lymph nodes are within normal limits. No mediastinal mass.  Lungs/Pleura: Bilateral subpleural emphysema and subpleural increased reticular opacity with some areas of mild architectural distortion. Subpleural opacity is maximal in the lower lobes and costophrenic angles. The major airways are patent with mild central peribronchial thickening. Superimposed mild gas trapping at the lung bases including the left lingula lateral costophrenic angle (series 3, image 103). No consolidation. No definite pleural effusion.  Upper Abdomen: Rim calcified gallbladder versus an oval 3.3 centimeter gallstone (series 6,  image 65). No intra-or extrahepatic biliary ductal enlargement. No pericholecystic inflammation. Negative liver otherwise. Visible spleen, pancreas, and adrenal glands are within normal limits. Negative bowel in the upper abdomen aside from diverticulosis at the visible splenic flexure. Simple fluid density cyst in the lateral right renal midpole. Smaller low-density areas in the bilateral upper poles likely are additional benign cysts.  Musculoskeletal: No acute osseous abnormality identified. No suspicious osseous lesion.  IMPRESSION: 1. Chronic lung disease with paraseptal Emphysema (ICD10-J43.9) and possible developing pulmonary fibrosis. No superimposed acute pulmonary finding identified. 2. Coronary artery and Aortic Atherosclerosis (ICD10-I70.0). Left subclavian artery atherosclerotic stenosis estimated at 60-70%. Partially visible upper  abdominal aorta atherosclerosis. 3. 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 No CT evidence of acute cholecystitis or biliary obstruction.   Electronically Signed By: Genevie Ann M.D. On: 05/19/2017 16:30      Assessment & Plan:   Problem List Items Addressed This Visit    Benign prostatic hyperplasia with urinary frequency - Primary    Stable chronic BPH with nocturia - [facility] BPH score 13 (moderate, no comparison score), improved on finasteride - On Finasteride 19m, failed Tamsulosin 0.429mdaily (x 2 doses dizziness) - No known personal/family history of prostate CA - See by [facility] Dr StBernardo Heater/2019  Plan: 1. Discussed management of BPH again - continue Finasteride 9m2maily, remain off Flomax and will hold off on Silodosin prostate specific alpha blocker d/t cost/coverage - still advised him to follow-up with [facility] to check on coverage of this and if need PA or other options - he will hold off for now      COPD (chronic obstructive pulmonary disease) ([place]  New diagnosis, based on recent CT Chest imaging, former smoker, w/ some dyspnea No inhalers, not interested today No prior formal PFT evaluation or pulm Never on oxygen or had testing  See A&P - proceed with Cardiac work-up ECHO / Cards eval next as discussed with patient/familiy to rule out potential cardiac etiology - future may need PFTs next      Hemorrhoids    Stable w/o flare Trial on Lidocaine suppository gel kit, which has improved before - empiric rx for future use      Relevant Medications   Lidocaine-Hydrocortisone Ace 2-2 % KIT    Other Visit Diagnoses    Orthopnea       Relevant Orders   Ambulatory referral to Cardiology   ECHOCARDIOGRAM COMPLETE   Dyspnea, unspecified type       Relevant Orders   Ambulatory referral to Cardiology   ECHOCARDIOGRAM COMPLETE  Persistent stable symptoms without worsening, concern for cardiopulm etiology, now with new dx COPD emphysema and possible early pulm  fibrosis in former smoker, as most likely etiology, however given orthopnea positional symptoms and age history HTN among other factors agree with patient's concern to have a cardiac evaluation first to rule out other causes including CHF before pursuing more aggressive pulmonary testing/treatment  - Proceed with ECHO and [facility] - If unremarkable then follow-up here to discuss next options with Pulm / maintenance therapy      Meds ordered this encounter  Medications  . Lidocaine-Hydrocortisone Ace 2-2 % KIT    Sig: Use rectal suppository twice daily as needed for up to 1 week for hemorrhoids    Dispense:  1 each    Refill:  3   Orders Placed This Encounter  Procedures  . Ambulatory referral to Cardiology    Referral Priority:  Routine    Referral Type:   Consultation    Referral Reason:   Specialty Services Required    Requested Specialty:   Cardiology    Number of Visits Requested:   1  . ECHOCARDIOGRAM COMPLETE    Standing Status:   Future    Standing Expiration Date:   09/16/2018    Scheduling Instructions:     Also referred to [facility] for eval    Order Specific Question:   Where should this test be performed    Answer:   [facility]    Order Specific Question:   Please indicate who you request to read the echo results.    Answer:   [facility]    Order Specific Question:   Perflutren DEFINITY (image enhancing agent) should be administered unless hypersensitivity or allergy exist    Answer:   Administer Perflutren    Order Specific Question:   Expected Date:    Answer:   1 week     Follow up plan: Return in about 3 months (around 09/15/2017) for Dyspnea/Orthopnea / COPD, f/u Cardiology.  Nobie Putnam, [facility] 06/18/2017, 11:32 PM

## 2017-06-18 NOTE — Assessment & Plan Note (Signed)
Stable chronic BPH with nocturia - [facility]UA BPH score 13 (moderate, no comparison score), improved on finasteride - On Finasteride 5mg , failed Tamsulosin 0.4mg  daily (x 2 doses dizziness) - No known personal/family history of prostate CA - See by [facility] Dr Bernardo Heater 04/2017  Plan: 1. Discussed management of BPH again - continue Finasteride 5mg  daily, remain off Flomax and will hold off on Silodosin prostate specific alpha blocker d/t cost/coverage - still advised him to follow-up with [facility] to check on coverage of this and if need PA or other options - he will hold off for now

## 2017-06-18 NOTE — Patient Instructions (Addendum)
Thank you for coming to the office today.  1.  Referral to Heart Specialist and for ECHOcardiogram and evaluation. If not heard back call them to check status  [facility] at [place], [place] [address] Main: [phone]   Kathlyn Sacramento, MD Ida Rogue, MD Nelva Bush, MD  If urination problems then will need to check back with Urology  Last seen by Dr Nirvaan Giovanni on 05/13/17 - prescribed Silodosin 4mg  daily - this is prostate specific medicine to help urinate. This may not have been covered, it sounds like it was expensive >$180. - Call their office to ask if this med could be approved or covered by insurance, and ask if there is any other alternative, if you change your mind  Please schedule a Follow-up Appointment to: Return in about 3 months (around 09/15/2017) for Dyspnea/Orthopnea / COPD, f/u Cardiology.    If you have any other questions or concerns, please feel free to call the office or send a message through [place]. You may also schedule an earlier appointment if necessary.  Additionally, you may be receiving a survey about your experience at our office within a few days to 1 week by e-mail or mail. We value your feedback.  Nobie Putnam, DO [place]

## 2017-06-18 NOTE — Assessment & Plan Note (Signed)
New diagnosis, based on recent CT Chest imaging, former smoker, w/ some dyspnea No inhalers, not interested today No prior formal PFT evaluation or pulm Never on oxygen or had testing  See A&P - proceed with Cardiac work-up ECHO / Cards eval next as discussed with patient/familiy to rule out potential cardiac etiology - future may need PFTs next

## 2017-06-19 DIAGNOSIS — I83213 Varicose veins of right lower extremity with both ulcer of ankle and inflammation: Secondary | ICD-10-CM | POA: Diagnosis not present

## 2017-06-19 DIAGNOSIS — L97312 Non-pressure chronic ulcer of right ankle with fat layer exposed: Secondary | ICD-10-CM | POA: Diagnosis not present

## 2017-07-03 DIAGNOSIS — L97312 Non-pressure chronic ulcer of right ankle with fat layer exposed: Secondary | ICD-10-CM | POA: Diagnosis not present

## 2017-07-03 DIAGNOSIS — I83213 Varicose veins of right lower extremity with both ulcer of ankle and inflammation: Secondary | ICD-10-CM | POA: Diagnosis not present

## 2017-07-15 ENCOUNTER — Telehealth: Payer: Self-pay

## 2017-07-15 NOTE — Telephone Encounter (Signed)
PA for sildosin APPROVED!

## 2017-07-17 DIAGNOSIS — L97312 Non-pressure chronic ulcer of right ankle with fat layer exposed: Secondary | ICD-10-CM | POA: Diagnosis not present

## 2017-07-17 DIAGNOSIS — G6289 Other specified polyneuropathies: Secondary | ICD-10-CM | POA: Diagnosis not present

## 2017-07-17 DIAGNOSIS — I83213 Varicose veins of right lower extremity with both ulcer of ankle and inflammation: Secondary | ICD-10-CM | POA: Diagnosis not present

## 2017-07-30 ENCOUNTER — Encounter: Payer: Self-pay | Admitting: Internal Medicine

## 2017-07-30 ENCOUNTER — Ambulatory Visit (INDEPENDENT_AMBULATORY_CARE_PROVIDER_SITE_OTHER): Payer: Medicare Other | Admitting: Internal Medicine

## 2017-07-30 VITALS — BP 130/78 | HR 80 | Ht 70.0 in | Wt 213.5 lb

## 2017-07-30 DIAGNOSIS — E785 Hyperlipidemia, unspecified: Secondary | ICD-10-CM | POA: Diagnosis not present

## 2017-07-30 DIAGNOSIS — I700 Atherosclerosis of aorta: Secondary | ICD-10-CM | POA: Diagnosis not present

## 2017-07-30 DIAGNOSIS — I10 Essential (primary) hypertension: Secondary | ICD-10-CM | POA: Diagnosis not present

## 2017-07-30 DIAGNOSIS — I2510 Atherosclerotic heart disease of native coronary artery without angina pectoris: Secondary | ICD-10-CM | POA: Diagnosis not present

## 2017-07-30 DIAGNOSIS — I739 Peripheral vascular disease, unspecified: Secondary | ICD-10-CM

## 2017-07-30 DIAGNOSIS — R0609 Other forms of dyspnea: Secondary | ICD-10-CM

## 2017-07-30 DIAGNOSIS — I2584 Coronary atherosclerosis due to calcified coronary lesion: Secondary | ICD-10-CM

## 2017-07-30 NOTE — Progress Notes (Signed)
New Outpatient Visit Date: 07/30/2017  Referring Provider: [name], DO [address] [place], [place] [address]  Chief Complaint: Shortness of breath  HPI:  Raymond Barnett is a 84 y.o. male who is being seen today for the evaluation of shortness of breath at the request of Dr. Parks Ranger. He has a history of hypertension, thrombocytopenia, BPH, arthritis, osteomyelitis (1953), and BPH. Today, Raymond Barnett has two complaints: shortness of breath and right ankle wound that has been very slow to heal.  He reports progressive shortness of breath with activity over the last 5 years.  He now experiences shortness of breath walking 50 feet or less.  He denies chest pain but has noted mild "tightness" in his chest when he exerts himself and becomes short of breath.  He denies symptoms at rest, as well as orthopnea, PND, palpitations, and lightheadedness.  Raymond Barnett denies a history of prior cardiac disease and testing.  Raymond Barnett noted a small scab along the right lateral ankle last summer.  It subsequently developed into a draining wound, prompting evaluation at the [facility] in 11/2016.  Since that time, he has been followed closely there and has undergone 3 applications of Matristem grafts.  He notes that the wound has improved and is no longer healing.  However, further grafting may be necessary.  Raymond Barnett has a remote history of osteomyelitis involving the medial aspect of the right distal calf decades ago.  He reports pain in both calves and thighs described as an ache.  The pain is most pronounced with activity.  He also notes numbness in his feet.  He denies having undergone peripheral vascular evaluation in the past.  Of note, prior chest CT at [facility] showed significant aortic, coronary, and subclavian calcifications with 60-70% ostial stenosis of the left subclavian  artery.  --------------------------------------------------------------------------------------------------  Cardiovascular History & Procedures: Cardiovascular Problems:  Dyspnea on exertion  Claudication with poorly healing right ankle wound  Risk Factors:  Hypertension, aortic atherosclerosis, male gender, and age > 66  Cath/PCI:  None  CV Surgery:  None  EP Procedures and Devices:  None  Non-Invasive Evaluation(s):  None  Recent CV Pertinent Labs: Lab Results  Component Value Date   CHOL 152 07/04/2016   TRIG 156 (A) 07/04/2016   K 4.3 02/12/2017   BUN 16 02/12/2017   BUN 12 02/16/2015   CREATININE 1.00 05/19/2017    --------------------------------------------------------------------------------------------------  Past Medical History:  Diagnosis Date  . Arthritis   . Benign prostate hyperplasia    lower urinary tract symptoms  . Cataracts, bilateral   . DDD (degenerative disc disease), lumbar   . Glaucoma   . H/O carpal tunnel syndrome   . History of osteomyelitis 1953   right leg  . Hypertension   . Thrombocytopenia ([place])     Past Surgical History:  Procedure Laterality Date  . CARPAL TUNNEL RELEASE Left   . CARPAL TUNNEL RELEASE Right   . COLONOSCOPY  2015   Dr. Allen Norris  . LEG SURGERY Right 1953  . SKIN GRAFT Right 05/01/2017   right foot at [facility]  . TOTAL KNEE ARTHROPLASTY Right     Current Meds  Medication Sig  . aspirin 81 MG tablet Take 1 tablet by mouth daily.  . Calcium Carb-Cholecalciferol (CALCIUM 600 + D) 600-200 MG-UNIT TABS Take 1 tablet by mouth daily. Patient takes 1000 mg daily.  . finasteride (PROSCAR) 5 MG tablet Take 5 mg by mouth daily.  . Lidocaine-Hydrocortisone Ace  2-2 % KIT Use rectal suppository twice daily as needed for up to 1 week for hemorrhoids  . lisinopril (PRINIVIL,ZESTRIL) 5 MG tablet Take 1 tablet (5 mg total) by mouth daily.  Marland Kitchen loratadine (CLARITIN) 10 MG tablet Take 1 tablet (10 mg total) by mouth  daily.  . naproxen (NAPROSYN) 500 MG tablet Take 500 mg by mouth daily as needed.  . timolol (TIMOPTIC) 0.5 % ophthalmic solution 1 drop 2 (two) times daily.    Allergies: Patient has no known allergies.  Social History   Socioeconomic History  . Marital status: Married    Spouse name: Not on file  . Number of children: Not on file  . Years of education: Not on file  . Highest education level: Not on file  Occupational History  . Not on file  Social Needs  . Financial resource strain: Not hard at all  . Food insecurity:    Worry: Never true    Inability: Never true  . Transportation needs:    Medical: No    Non-medical: No  Tobacco Use  . Smoking status: Former Smoker    Packs/day: 1.00    Years: 30.00    Pack years: 30.00    Types: Cigarettes    Last attempt to quit: 1995    Years since quitting: 24.2  . Smokeless tobacco: Former Network engineer and Sexual Activity  . Alcohol use: Not Currently    Alcohol/week: 0.6 oz    Types: 1 Glasses of wine per week    Frequency: Never  . Drug use: No  . Sexual activity: Not Currently  Lifestyle  . Physical activity:    Days per week: 0 days    Minutes per session: 0 min  . Stress: Not at all  Relationships  . Social connections:    Talks on phone: Twice a week    Gets together: More than three times a week    Attends religious service: More than 4 times per year    Active member of club or organization: No    Attends meetings of clubs or organizations: Never    Relationship status: Married  . Intimate partner violence:    Fear of current or ex partner: No    Emotionally abused: No    Physically abused: No    Forced sexual activity: No  Other Topics Concern  . Not on file  Social History Narrative  . Not on file    Family History  Problem Relation Age of Onset  . Colon cancer Mother 69  . Thrombosis Father 68  . Heart disease Father        Heart valve problem  . AAA (abdominal aortic aneurysm) Father   .  Hypertension Sister   . Hyperlipidemia Sister   . Hypertension Sister   . Hyperlipidemia Sister   . Hypertension Sister   . Hyperlipidemia Sister     Review of Systems: A 12-system review of systems was performed and was negative except as noted in the HPI.  --------------------------------------------------------------------------------------------------  Physical Exam: BP 130/78 (BP Location: Right Arm, Patient Position: Sitting, Cuff Size: Large)   Pulse 80   Ht '5\' 10"'  (1.778 m)   Wt 213 lb 8 oz (96.8 kg)   BMI 30.63 kg/m   General:  Elderly man seated comfortably on the exam table.  He is accompanied by his wife. HEENT: No conjunctival pallor or scleral icterus. Moist mucous membranes. OP clear. Neck: Supple without lymphadenopathy, thyromegaly, JVD, or HJR. No  carotid bruit. Lungs: Normal work of breathing. Clear to auscultation bilaterally without wheezes or crackles. Heart: Regular rate and rhythm without murmurs, rubs, or gallops. Non-displaced PMI. Abd: Bowel sounds present. Soft, NT/ND without hepatosplenomegaly Ext: No lower extremity edema. Right foot/ankle is wrapped with a clean dressing.  Radial pulses are 2+ bilaterally (slightly less prominent on the left).  Pedal pulses are trace bilaterally. Skin: Warm and dry without rash. Neuro: CNIII-XII intact. Psych: Normal mood and affect.  EKG:  NSR with RBBB and inferior Q-waves.  Lab Results  Component Value Date   WBC 7.1 02/12/2017   HGB 13.4 02/12/2017   HCT 39.7 (L) 02/12/2017   MCV 97.6 02/12/2017   PLT 124 (L) 02/12/2017    Lab Results  Component Value Date   NA 136 02/12/2017   K 4.3 02/12/2017   CL 103 02/12/2017   CO2 24 02/12/2017   BUN 16 02/12/2017   CREATININE 1.00 05/19/2017   GLUCOSE 88 02/12/2017   ALT 18 08/14/2016    Lab Results  Component Value Date   CHOL 152 07/04/2016   TRIG 156 (A) 07/04/2016      --------------------------------------------------------------------------------------------------  ASSESSMENT AND PLAN: Dyspnea on exertion and coronary artery calcification Progressive problem for many years.  Mr. Vangilder denies prior cardiac workup.  Given accompanying chest tightness and extensive coronary artery calcification on prior chest CT from January, I am suspicious for significant coronary artery disease.  We have agreed to begin with a transthoracic echocardiogram.  If there are no significant structural abnormalities, we will proceed with pharmacologic myocardial perfusion stress test.  Otherwise, we will proceed directly to cardiac catheterization.  Last LDL in 06/2016 was 92.  We will obtain a lipid panel when Mr. Weida returns for the aforementioned testing in anticipation of adding a statin for secondary prevention.  Claudication and peripheral vascular disease Mr. Marik reports symptoms consistent with bilateral lower extremity PAD.  He has also had difficulty with healing of a right ankle wound (concerning for critical limb ischemia).  I see no record of prior lower extremity vascular evaluation.  Moderate to severe right subclavian artery stenosis also noted on CT of the chest earlier this year (no arm claudication noted by the patient; BP actually slightly higher on the left today).  We will obtain ABI's and arterial Doppler studies for further assessment of the lower extremities.  I have asked Mr. Mathieu to continue taking aspirin.  Hypertension BP upper normal to mildly elevated today.  I will defer making any medication changes today; follow-up with Dr. Parks Ranger as previously arranged.  Hyperlipidemia We will repeat lipid panel when Mr. Lamboy for his vascular testing/echo in anticipation of adding a statin to achieve LDL < 70, given likely CAD and PAD.  Aortic atherosclerosis Noted on chest CT from 04/2017.  Continue ASA and blood pressure control.  We will  obtain a fasting lipid panel in anticipation of adding at least moderate-intensity statin.  Follow-up: Return to clinic in 6 weeks.  Nelva Bush, MD 07/30/2017 4:46 PM

## 2017-07-30 NOTE — Patient Instructions (Addendum)
Medication Instructions:  Your physician recommends that you continue on your current medications as directed. Please refer to the Current Medication list given to you today.   Labwork: Your physician recommends that you return for lab work ON THE MORNING OF YOUR TESTING (LIPID, ALT). - YOU WILL NEED TO BE FASTING.   Testing/Procedures: Your physician has requested that you have an echocardiogram. Echocardiography is a painless test that uses sound waves to create images of your heart. It provides your doctor with information about the size and shape of your heart and how well your heart's chambers and valves are working. This procedure takes approximately one hour. There are no restrictions for this procedure.   Your physician has requested that you have an ankle brachial index (ABI). During this test an ultrasound and blood pressure cuff are used to evaluate the arteries that supply the arms and legs with blood. Allow thirty minutes for this exam. There are no restrictions or special instructions.  Your physician has requested that you have a lower extremity arterial doppler- During this test, ultrasound is used to evaluate arterial blood flow in the legs. Allow approximately one hour for this exam.     Follow-Up: Your physician recommends that you schedule a follow-up appointment in: [place].    Echocardiogram An echocardiogram, or echocardiography, uses sound waves (ultrasound) to produce an image of your heart. The echocardiogram is simple, painless, obtained within a short period of time, and offers valuable information to your health care provider. The images from an echocardiogram can provide information such as:  Evidence of coronary artery disease (CAD).  Heart size.  Heart muscle function.  Heart valve function.  Aneurysm detection.  Evidence of a past heart attack.  Fluid buildup around the heart.  Heart muscle  thickening.  Assess heart valve function.  Tell a health care provider about:  Any allergies you have.  All medicines you are taking, including vitamins, herbs, eye drops, creams, and over-the-counter medicines.  Any problems you or family members have had with anesthetic medicines.  Any blood disorders you have.  Any surgeries you have had.  Any medical conditions you have.  Whether you are pregnant or may be pregnant. What happens before the procedure? No special preparation is needed. Eat and drink normally. What happens during the procedure?  In order to produce an image of your heart, gel will be applied to your chest and a wand-like tool (transducer) will be moved over your chest. The gel will help transmit the sound waves from the transducer. The sound waves will harmlessly bounce off your heart to allow the heart images to be captured in real-time motion. These images will then be recorded.  You may need an IV to receive a medicine that improves the quality of the pictures. What happens after the procedure? You may return to your normal schedule including diet, activities, and medicines, unless your health care provider tells you otherwise. This information is not intended to replace advice given to you by your health care provider. Make sure you discuss any questions you have with your health care provider. Document Released: 04/05/2000 Document Revised: 11/25/2015 Document Reviewed: 12/14/2012 Elsevier Interactive Patient Education  2017 [employer].

## 2017-07-31 ENCOUNTER — Encounter: Payer: Self-pay | Admitting: Internal Medicine

## 2017-07-31 ENCOUNTER — Telehealth: Payer: Self-pay | Admitting: Internal Medicine

## 2017-07-31 DIAGNOSIS — R0609 Other forms of dyspnea: Secondary | ICD-10-CM

## 2017-07-31 DIAGNOSIS — E785 Hyperlipidemia, unspecified: Secondary | ICD-10-CM | POA: Insufficient documentation

## 2017-07-31 DIAGNOSIS — I2510 Atherosclerotic heart disease of native coronary artery without angina pectoris: Secondary | ICD-10-CM | POA: Insufficient documentation

## 2017-07-31 DIAGNOSIS — I2584 Coronary atherosclerosis due to calcified coronary lesion: Secondary | ICD-10-CM

## 2017-07-31 DIAGNOSIS — I739 Peripheral vascular disease, unspecified: Secondary | ICD-10-CM | POA: Insufficient documentation

## 2017-07-31 DIAGNOSIS — I700 Atherosclerosis of aorta: Secondary | ICD-10-CM | POA: Insufficient documentation

## 2017-07-31 NOTE — Telephone Encounter (Signed)
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 He needs echo, ABI/LEAs and fasting lab work on the same day in the morning so he can have the fasting labs first thing if possible.  He needs 6 week f/u preferable with Dr End per Dr End. He said it was ok to add patient on. I was thinking of 5/29 or 5/30 and use one of the 7 DAY holds or SAME DAY if we have too.  If you have anything questions, let me know! Thanks, [employer]

## 2017-07-31 NOTE — Telephone Encounter (Signed)
Attempted to call patient to schedule No voice mail Will try again at a later time

## 2017-08-01 NOTE — Telephone Encounter (Signed)
I think this patient Dr End asked for appointment with him. Ok to use appointment on 5/29, please. Thanks!

## 2017-08-01 NOTE — Telephone Encounter (Signed)
R/s to 5/29 with End lmov yesterday to schedule so awaiting call back.

## 2017-08-01 NOTE — Telephone Encounter (Signed)
Patient already scheduled for Echo and LEA on 5/22 also fu with Ryan on 5/28 should we change to 5/29 with End ?

## 2017-08-13 ENCOUNTER — Other Ambulatory Visit: Payer: Medicare Other

## 2017-08-13 ENCOUNTER — Ambulatory Visit: Payer: Medicare Other | Admitting: Internal Medicine

## 2017-08-14 DIAGNOSIS — L97312 Non-pressure chronic ulcer of right ankle with fat layer exposed: Secondary | ICD-10-CM | POA: Diagnosis not present

## 2017-08-14 DIAGNOSIS — I83209 Varicose veins of unspecified lower extremity with both ulcer of unspecified site and inflammation: Secondary | ICD-10-CM | POA: Diagnosis not present

## 2017-08-14 DIAGNOSIS — L97909 Non-pressure chronic ulcer of unspecified part of unspecified lower leg with unspecified severity: Secondary | ICD-10-CM | POA: Diagnosis not present

## 2017-08-14 DIAGNOSIS — G6289 Other specified polyneuropathies: Secondary | ICD-10-CM | POA: Diagnosis not present

## 2017-09-10 ENCOUNTER — Other Ambulatory Visit: Payer: Self-pay

## 2017-09-10 ENCOUNTER — Ambulatory Visit (INDEPENDENT_AMBULATORY_CARE_PROVIDER_SITE_OTHER): Payer: Medicare Other

## 2017-09-10 ENCOUNTER — Other Ambulatory Visit (INDEPENDENT_AMBULATORY_CARE_PROVIDER_SITE_OTHER): Payer: Medicare Other

## 2017-09-10 DIAGNOSIS — I739 Peripheral vascular disease, unspecified: Secondary | ICD-10-CM | POA: Diagnosis not present

## 2017-09-10 DIAGNOSIS — E785 Hyperlipidemia, unspecified: Secondary | ICD-10-CM | POA: Diagnosis not present

## 2017-09-10 DIAGNOSIS — R0609 Other forms of dyspnea: Secondary | ICD-10-CM | POA: Diagnosis not present

## 2017-09-11 DIAGNOSIS — I83213 Varicose veins of right lower extremity with both ulcer of ankle and inflammation: Secondary | ICD-10-CM | POA: Diagnosis not present

## 2017-09-11 DIAGNOSIS — Z981 Arthrodesis status: Secondary | ICD-10-CM | POA: Diagnosis not present

## 2017-09-11 DIAGNOSIS — G6289 Other specified polyneuropathies: Secondary | ICD-10-CM | POA: Diagnosis not present

## 2017-09-11 DIAGNOSIS — L97312 Non-pressure chronic ulcer of right ankle with fat layer exposed: Secondary | ICD-10-CM | POA: Diagnosis not present

## 2017-09-11 LAB — LIPID PANEL
CHOLESTEROL TOTAL: 154 mg/dL (ref 100–199)
Chol/HDL Ratio: 3.5 ratio (ref 0.0–5.0)
HDL: 44 mg/dL (ref >39)
LDL CALC: 91 mg/dL (ref 0–99)
Triglycerides: 97 mg/dL (ref 0–149)
VLDL CHOLESTEROL CAL: 19 mg/dL (ref 5–40)

## 2017-09-11 LAB — ALT: ALT: 16 IU/L (ref 0–44)

## 2017-09-12 NOTE — Telephone Encounter (Signed)
lmov to confirm appt   °

## 2017-09-16 ENCOUNTER — Ambulatory Visit (INDEPENDENT_AMBULATORY_CARE_PROVIDER_SITE_OTHER): Payer: Medicare Other | Admitting: Family Medicine

## 2017-09-16 ENCOUNTER — Ambulatory Visit: Payer: Medicare Other | Admitting: Physician Assistant

## 2017-09-16 ENCOUNTER — Encounter: Payer: Self-pay | Admitting: Family Medicine

## 2017-09-16 VITALS — BP 121/68 | HR 71 | Temp 98.3°F | Resp 16 | Ht 70.0 in | Wt 214.0 lb

## 2017-09-16 DIAGNOSIS — I739 Peripheral vascular disease, unspecified: Secondary | ICD-10-CM

## 2017-09-16 DIAGNOSIS — R310 Gross hematuria: Secondary | ICD-10-CM

## 2017-09-16 DIAGNOSIS — J432 Centrilobular emphysema: Secondary | ICD-10-CM | POA: Diagnosis not present

## 2017-09-16 LAB — POCT URINALYSIS DIPSTICK
Bilirubin, UA: NEGATIVE
Blood, UA: NEGATIVE
Glucose, UA: NEGATIVE
KETONES UA: NEGATIVE
LEUKOCYTES UA: NEGATIVE
NITRITE UA: NEGATIVE
PH UA: 7 (ref 5.0–8.0)
PROTEIN UA: NEGATIVE
SPEC GRAV UA: 1.02 (ref 1.010–1.025)
UROBILINOGEN UA: 0.2 U/dL

## 2017-09-16 NOTE — Progress Notes (Signed)
Subjective:    Patient ID: Raymond Barnett, male    DOB: Jul 02, 1929, 84 y.o.   MRN: [id number]  Raymond Barnett is a 84 y.o. male presenting on 09/16/2017 for COPD   HPI   FOLLOW-UP Orthopnea / Dyspnea / Abnormal CXR - CT Results - Last visit with me 05/2017, for same problem after had prior x-ray and then CT Chest to follow-up pulm nodule vs density and identified COPD emphysema and some early pulm fibrosis, see prior notes for background information. - Interval update with patient has now been referred to Cardiology to rule out cardiac cause, has had ECHO and PAD ultrasound testing, showed some mild reduced LVEF 45-50% and some moderate stenosis of RLE, findings concern for PAD vs CAD pending further work-up, next apt with Cardiology Raymond Barnett is tomorrow 5/29 - Today patient reports some improvement in dyspnea, it seems to be improved from previous visits, but still present. Still seems positional more than exertional - He is former smoker - Regarding inhaler never given maintenance inhaler but he rarely uses albuterol as rescue inhaler - Denies chest pain or other exertional symptoms actively  Additional new complaint  Gross Hematuria - He reports no prior episodes but only recent problem past week or so, if prolonged sitting, will have first few drops of gross hematuria "bright red blood" only intermittent few times over past 1 week - Asking about checking UA today - He was referred to [facility] - saw Raymond Barnett in 04/2017, was switched from flomax to silodosin with limited results, he did not plan to return for now No prior history of nephrolithiasis or UTI - Denies dysuria, fever chills, active hematuria, abdominal pain or flank pain   Depression screen Raymond Barnett 2/9 09/16/2017 05/06/2017 05/06/2017  Decreased Interest 0 0 0  Down, Depressed, Hopeless 0 0 0  PHQ - 2 Score 0 0 0  Altered sleeping - - -  Tired, decreased energy - - -  Change in appetite - - -  Feeling bad or failure about  yourself  - - -  Trouble concentrating - - -  Moving slowly or fidgety/restless - - -  Suicidal thoughts - - -  PHQ-9 Score - - -  Difficult doing work/chores - - -    Social History   Tobacco Use  . Smoking status: Former Smoker    Packs/day: 1.00    Years: 30.00    Pack years: 30.00    Types: Cigarettes    Last attempt to quit: 1995    Years since quitting: 24.4  . Smokeless tobacco: Former Network engineer Use Topics  . Alcohol use: Not Currently    Alcohol/week: 0.6 oz    Types: 1 Glasses of wine per week    Frequency: Never  . Drug use: No    Review of Systems Per HPI unless specifically indicated above     Objective:    BP 121/68   Pulse 71   Temp 98.3 F (36.8 C) (Oral)   Resp 16   Ht 5\' 10"  (1.778 m)   Wt 214 lb (97.1 kg)   BMI 30.71 kg/m   Wt Readings from Last 3 Encounters:  09/16/17 214 lb (97.1 kg)  07/30/17 213 lb 8 oz (96.8 kg)  06/18/17 216 lb (98 kg)    Physical Exam  Constitutional: He is oriented to person, place, and time. He appears well-developed and well-nourished. No distress.  Well-appearing, comfortable, cooperative  HENT:  Head: Normocephalic and atraumatic.  Mouth/Throat: Oropharynx is clear and moist.  Eyes: Conjunctivae are normal. Right eye exhibits no discharge. Left eye exhibits no discharge.  Cardiovascular: Normal rate.  Pulmonary/Chest: Effort normal. No respiratory distress.  No coughing, speaks full sentences  Abdominal: There is no tenderness.  Musculoskeletal: He exhibits no edema.  Neurological: He is alert and oriented to person, place, and time.  Skin: Skin is warm and dry. No rash noted. He is not diaphoretic. No erythema.  Psychiatric: He has a normal mood and affect. His behavior is normal.  Well groomed, good eye contact, normal speech and thoughts  Nursing note and vitals reviewed.  Results for orders placed or performed in visit on 09/16/17  POCT urinalysis dipstick  Result Value Ref Range   Color, UA  amber    Clarity, UA clear    Glucose, UA Negative Negative   Bilirubin, UA negative    Ketones, UA negative    Spec Grav, UA 1.020 1.010 - 1.025   Blood, UA negative    pH, UA 7.0 5.0 - 8.0   Protein, UA Negative Negative   Urobilinogen, UA 0.2 0.2 or 1.0 E.U./dL   Nitrite, UA negative    Leukocytes, UA Negative Negative   Appearance clear    Odor none       Assessment & Plan:   Problem List Items Addressed This Visit    COPD (chronic obstructive pulmonary disease) (HCC)    Clinically stable COPD on prior imaging former smoker Again he declines maintenance therapy Some abnormal findings with cardiac work-up pending further diagnostics, may ultimately have mixture of cardiac vs respiratory symptoms for his dyspnea - Future will re-offer daily inhaler if interested to control symptoms at baseline better, use Albuterol only PRN flare      Relevant Medications   albuterol (PROVENTIL HFA;VENTOLIN HFA) 108 (90 Base) MCG/ACT inhaler   Peripheral arterial disease (HCC)    Consistent with PAD on prior work-up per Cardiology with ECHO and extremity ultrasounds, including RLE PAD - Follow-up as planned with Cardiology       Other Visit Diagnoses    Gross hematuria    -  Primary   Relevant Orders   POCT urinalysis dipstick (Completed)   Urine Culture    Given new episode of acute gross hematuria, seems to not be resolving over past week, unusually seems positional with prolonged sitting only, small amount, no other history of bladder abnormality that would explain. Cannot rule out malignancy based on gross hematuria  Today I recommended return to already established Urology to pursue gross hematuria protocol with CT renal stone study and cystoscopy but patient declined this, he prefers to check urine here in office and follow-up - Checked UA - unremarkable, sent to lab for urine culture, f/u results - if negative and he has any recurrences again will recommend return to urology  No  orders of the defined types were placed in this encounter.     Follow up plan: Return in about 3 months (around 12/17/2017) for COPD / BPH vs Hematuria f/u.  Raymond Barnett, Raymond Barnett 09/17/2017, 12:27 AM

## 2017-09-16 NOTE — Patient Instructions (Addendum)
Thank you for coming to the office today.  Will check urine test today - stay tuned for results, and then will send to lab for urine culture - this is to determine if need antibiotic treatment  As I have advised, all men age >30 with blood visible in urine should be seen by Urologist to discuss other testing such as imaging for stones and scope inside bladder to rule out cancer - if you are interested in this route will recommend that you call and schedule back with Urologist - may see a different provider if you prefer  For the heart - I agree with Dr End cardiologist - they recommend a Cholesterol medicine to help protect arteries in heart and leg  Follow-up as planned  If breathing is not improving we can discuss a daily inhaler to help breathing  Please schedule a Follow-up Appointment to: Return in about 3 months (around 12/17/2017) for COPD / BPH vs Hematuria f/u.  If you have any other questions or concerns, please feel free to call the office or send a message through [place]. You may also schedule an earlier appointment if necessary.  Additionally, you may be receiving a survey about your experience at our office within a few days to 1 week by e-mail or mail. We value your feedback.  Nobie Putnam, DO [place]

## 2017-09-17 ENCOUNTER — Ambulatory Visit: Payer: Medicare Other | Admitting: Family Medicine

## 2017-09-17 ENCOUNTER — Ambulatory Visit (INDEPENDENT_AMBULATORY_CARE_PROVIDER_SITE_OTHER): Payer: Medicare Other | Admitting: Internal Medicine

## 2017-09-17 ENCOUNTER — Encounter: Payer: Self-pay | Admitting: Internal Medicine

## 2017-09-17 VITALS — BP 110/70 | HR 73 | Ht 70.0 in | Wt 211.5 lb

## 2017-09-17 DIAGNOSIS — S91001S Unspecified open wound, right ankle, sequela: Secondary | ICD-10-CM | POA: Diagnosis not present

## 2017-09-17 DIAGNOSIS — I429 Cardiomyopathy, unspecified: Secondary | ICD-10-CM | POA: Diagnosis not present

## 2017-09-17 DIAGNOSIS — I2510 Atherosclerotic heart disease of native coronary artery without angina pectoris: Secondary | ICD-10-CM

## 2017-09-17 DIAGNOSIS — I2584 Coronary atherosclerosis due to calcified coronary lesion: Secondary | ICD-10-CM

## 2017-09-17 DIAGNOSIS — I739 Peripheral vascular disease, unspecified: Secondary | ICD-10-CM | POA: Diagnosis not present

## 2017-09-17 DIAGNOSIS — E785 Hyperlipidemia, unspecified: Secondary | ICD-10-CM

## 2017-09-17 DIAGNOSIS — I10 Essential (primary) hypertension: Secondary | ICD-10-CM | POA: Diagnosis not present

## 2017-09-17 DIAGNOSIS — R0609 Other forms of dyspnea: Secondary | ICD-10-CM | POA: Diagnosis not present

## 2017-09-17 DIAGNOSIS — Z79899 Other long term (current) drug therapy: Secondary | ICD-10-CM | POA: Diagnosis not present

## 2017-09-17 LAB — URINE CULTURE
MICRO NUMBER: 90640596
RESULT: NO GROWTH
SPECIMEN QUALITY:: ADEQUATE

## 2017-09-17 MED ORDER — ROSUVASTATIN CALCIUM 5 MG PO TABS: 5.0000 mg | ORAL_TABLET | Freq: Every day | ORAL | 3 refills | Status: DC

## 2017-09-17 NOTE — Patient Instructions (Signed)
Medication Instructions:  Your physician has recommended you make the following change in your medication:  1- START Rosuvastatin 5 mg by mouth once a day.   Labwork: Your physician recommends that you return for lab work in: [place]. (ALT/ LIPID). - You will need to be fasting. - Please go to the [facility]. You will check in at the front desk to the right as you walk into the atrium. Valet Parking is offered if needed.    Testing/Procedures: none  Follow-Up: Your physician recommends that you schedule a follow-up appointment in: 3 MONTHS WITH DR END.  If you need a refill on your cardiac medications before your next appointment, please call your pharmacy.

## 2017-09-17 NOTE — Assessment & Plan Note (Signed)
Clinically stable COPD on prior imaging former smoker Again he declines maintenance therapy Some abnormal findings with cardiac work-up pending further diagnostics, may ultimately have mixture of cardiac vs respiratory symptoms for his dyspnea - Future will re-offer daily inhaler if interested to control symptoms at baseline better, use Albuterol only PRN flare

## 2017-09-17 NOTE — Assessment & Plan Note (Signed)
Consistent with PAD on prior work-up per Cardiology with ECHO and extremity ultrasounds, including RLE PAD - Follow-up as planned with Cardiology

## 2017-09-17 NOTE — Progress Notes (Signed)
Follow-up Outpatient Visit Date: 09/17/2017  Primary Care Provider: [name], DO [address] [address]  Chief Complaint: Follow-up cardiomyopathy and PAD  HPI:  Raymond Barnett is a 84 y.o. year-old male with history of recently diagnosed PAD with slow to heal wound on the right ankle, hypertension, thrombocytopenia, BPH, arthritis, and remote osteoarthritis, who presents for follow-up of PAD and shortness of breath.  I met Raymond Barnett last month, at which time he reported progressive shortness of breath over the last 5 years, now present when walking 50 feet or less.  He also described poor wound healing of a wound on the right lateral ankle that developed over a year ago.  Subsequent echocardiogram and lower extremity vascular studies were abnormal, as detailed below.  Today, Raymond Barnett reports feeling relatively well.  He believes that his exertional dyspnea and shortness of breath in bed have improved.  He attributes this to the less nasal congestion.  He has not had any further episodes of chest pain/tightness.  He also denies orthopnea, PND, edema, palpitations, and lightheadedness.  Raymond Barnett reports that his right lateral ankle wound continues to heal gradually.  He follows regularly with Dr[facility]raine [facility] at [facility].  He does not have any significant pain in his lower extremities though he notes numbness and difficulty balancing in both legs.  He attributes this to neuropathy.  He also has chronic low back pain.  --------------------------------------------------------------------------------------------------  Cardiovascular History & Procedures: Cardiovascular Problems:  Dyspnea on exertion with mildly reduced LVEF  Claudication with poorly healing right ankle wound  Risk Factors:  Hypertension, aortic atherosclerosis, male gender, and age > 24  Cath/PCI:  None  CV Surgery:  None  EP Procedures and Devices:  None  Non-Invasive  Evaluation(s):  Echocardiogram (09/10/2017): Normal LV size with moderate LVH.  LVEF mildly reduced at 45 to 50% with possible inferior/posterior hypokinesis.  Grade 2 diastolic dysfunction with elevated filling pressure.  Mild aortic and moderate mitral regurgitation.  Mild to moderate left atrial enlargement.  Mild to moderate pulmonary hypertension.  Bilateral lower extremity arterial ultrasound (09/10/2017): Right lower extremity with moderate distal popliteal and moderate to severe posterior runoff disease most pronounced in the posterior tibial artery.  Left lower extremity without significant stenosis.  ABI's were 0.6 on the right and 1.1 on the left; TBI's 0.33 on the right and 0.59 on the left.   Recent CV Pertinent Labs: Lab Results  Component Value Date   CHOL 154 09/10/2017   CHOL 152 07/04/2016   HDL 44 09/10/2017   LDLCALC 91 09/10/2017   TRIG 97 09/10/2017   TRIG 156 (A) 07/04/2016   CHOLHDL 3.5 09/10/2017   K 4.3 02/12/2017   BUN 16 02/12/2017   BUN 12 02/16/2015   CREATININE 1.00 05/19/2017    Past medical and surgical history were reviewed and updated in EPIC.  Current Meds  Medication Sig  . aspirin 81 MG tablet Take 1 tablet by mouth daily.  . Calcium Carb-Cholecalciferol (CALCIUM 600 + D) 600-200 MG-UNIT TABS Take 1 tablet by mouth daily. Patient takes 1000 mg daily.  . finasteride (PROSCAR) 5 MG tablet Take 5 mg by mouth daily.  . Lidocaine-Hydrocortisone Ace 2-2 % KIT Use rectal suppository twice daily as needed for up to 1 week for hemorrhoids  . lisinopril (PRINIVIL,ZESTRIL) 5 MG tablet Take 1 tablet (5 mg total) by mouth daily.  Marland Kitchen loratadine (CLARITIN) 10 MG tablet Take 1 tablet (10 mg total) by mouth daily.  Marland Kitchen  timolol (TIMOPTIC) 0.5 % ophthalmic solution 1 drop 2 (two) times daily.    Allergies: Patient has no known allergies.  Social History   Tobacco Use  . Smoking status: Former Smoker    Packs/day: 1.00    Years: 30.00    Pack years: 30.00     Types: Cigarettes    Last attempt to quit: 1995    Years since quitting: 24.4  . Smokeless tobacco: Former Network engineer Use Topics  . Alcohol use: Not Currently    Alcohol/week: 0.6 oz    Types: 1 Glasses of wine per week    Frequency: Never  . Drug use: No    Family History  Problem Relation Age of Onset  . Colon cancer Mother 32  . Thrombosis Father 71  . Heart disease Father        Heart valve problem  . AAA (abdominal aortic aneurysm) Father   . Hypertension Sister   . Hyperlipidemia Sister   . Hypertension Sister   . Hyperlipidemia Sister   . Hypertension Sister   . Hyperlipidemia Sister     Review of Systems: Raymond Barnett notes occasional brief gross hematuria when beginning to urinate over the last few weeks.  Otherwise, a 12-system review of systems was performed and was negative except as noted in the HPI.  --------------------------------------------------------------------------------------------------  Physical Exam: BP 110/70 (BP Location: Left Arm, Patient Position: Sitting, Cuff Size: Normal)   Pulse 73   Ht 5' 10" (1.778 m)   Wt 211 lb 8 oz (95.9 kg)   BMI 30.35 kg/m   General: Elderly man, seated comfortably in the exam room.  He is accompanied by his wife. HEENT: No conjunctival pallor or scleral icterus. Moist mucous membranes.  OP clear. Neck: Supple without lymphadenopathy, thyromegaly, JVD, or HJR. Lungs: Normal work of breathing. Clear to auscultation bilaterally without wheezes or crackles. Heart: Regular rate and rhythm without murmurs, rubs, or gallops. Non-displaced PMI. Abd: Bowel sounds present. Soft, NT/ND without hepatosplenomegaly Ext: No lower extremity edema.  2+ radial and left pedal pulses.  Right pedal pulses are not palpable.  There is a superficial ulceration overlying the right lateral malleolus with scant yellow discharge.  No bleeding appreciated.  Margins are well-circumscribed. Skin: Warm and dry.   Lab Results   Component Value Date   WBC 7.1 02/12/2017   HGB 13.4 02/12/2017   HCT 39.7 (L) 02/12/2017   MCV 97.6 02/12/2017   PLT 124 (L) 02/12/2017    Lab Results  Component Value Date   NA 136 02/12/2017   K 4.3 02/12/2017   CL 103 02/12/2017   CO2 24 02/12/2017   BUN 16 02/12/2017   CREATININE 1.00 05/19/2017   GLUCOSE 88 02/12/2017   ALT 16 09/10/2017    Lab Results  Component Value Date   CHOL 154 09/10/2017   HDL 44 09/10/2017   LDLCALC 91 09/10/2017   TRIG 97 09/10/2017   CHOLHDL 3.5 09/10/2017    --------------------------------------------------------------------------------------------------  ASSESSMENT AND PLAN: Cardiomyopathy and dyspnea on exertion Dyspnea on exertion has actually improved since our last visit without intervention.  Echocardiogram revealed mildly reduced LVEF with suggestion of possible inferior/posterior wall motion abnormality.  Mild to moderately elevated pulmonary artery pressures were also noted.  I have encouraged Raymond Barnett to undergo ischemia evaluation.  We discussed noninvasive testing such as pharmacologic myocardial perfusion stress test, and cardiac catheterization at length.  Given his age and minimal symptoms, he wishes to defer testing at this time.  Given high suspicion for ASCVD, we have agreed to add low-dose rosuvastatin and continue aspirin 81 mg daily.  I am hesitant to add a beta-blocker at this time in light of his relatively low blood pressure.  Peripheral arterial disease and right ankle wound ABIs significantly abnormal with evidence of distal outflow and runoff disease on the right.  I am concerned that his slowly healing right lateral ankle wound could represent significant arterial insufficiency.  I discussed further evaluation options including cross-sectional imaging and conventional angiography and have recommended further testing to better understand Raymond Barnett PAD.  However, he wishes to defer this for the time being.  As  above, we will continue aspirin and add low-dose rosuvastatin for secondary prevention.  Given minimal symptoms and reduced LVEF on echocardiogram, I do not think that cilostazol is a good option for him.  He should continue close wound follow-up with Dr. Lorraine Barnett.  Hypertension Blood pressure well controlled.  Continue lisinopril 5 mg daily.  Hyperlipidemia Recent lipid panel was relatively good with LDL of 91.  However, given abnormal ABI consistent with PAD as well as concern for ischemic cardiomyopathy/CAD, I have recommended initiation of rosuvastatin 5 mg daily for target LDL less than 70.  Hematuria Hematuria noted intermittently over the last few weeks when Raymond Barnett begins to urinate.  Urologic evaluation was recommended by his PCP, which I am in agreement with.  Should cardiac catheterization and/or aortography/runoff with intervention be pursued in the future, it would be important to exclude potential causes for significant bleeding in case dual antiplatelet therapy is indicated.  Follow-up: Return to clinic in 3 months.  Approximately 45 minutes was spent face-to-face with the patient and his wife, with greater than 50% devoted to counseling.  Nelva Bush, MD 09/17/2017 2:29 PM

## 2017-10-09 DIAGNOSIS — G6289 Other specified polyneuropathies: Secondary | ICD-10-CM | POA: Diagnosis not present

## 2017-10-09 DIAGNOSIS — L97312 Non-pressure chronic ulcer of right ankle with fat layer exposed: Secondary | ICD-10-CM | POA: Diagnosis not present

## 2017-10-09 DIAGNOSIS — I83213 Varicose veins of right lower extremity with both ulcer of ankle and inflammation: Secondary | ICD-10-CM | POA: Diagnosis not present

## 2017-12-08 ENCOUNTER — Telehealth: Payer: Self-pay

## 2017-12-08 DIAGNOSIS — L97922 Non-pressure chronic ulcer of unspecified part of left lower leg with fat layer exposed: Secondary | ICD-10-CM

## 2017-12-08 NOTE — Telephone Encounter (Signed)
Never treated patient for this Chronic Lower Extremity ulcer, but we have discussed it before. He actively receives care at [facility] Dr Lorraine Lax last visit 07/2017.  I would be okay to place referral to [facility], and have them request records from [facility] - or access via [place].  Nobie Putnam, [facility] 12/08/2017, 12:57 PM

## 2017-12-08 NOTE — Telephone Encounter (Signed)
Patient called requesting a referral to [facility] wound clinic for a non healing wound on right foot.  Per patient he has been seeing [facility] for this problem, but would like to change.  Please advise is you need an appointment first.

## 2017-12-17 ENCOUNTER — Ambulatory Visit (INDEPENDENT_AMBULATORY_CARE_PROVIDER_SITE_OTHER): Payer: Medicare Other | Admitting: Family Medicine

## 2017-12-17 ENCOUNTER — Other Ambulatory Visit: Payer: Self-pay | Admitting: Family Medicine

## 2017-12-17 ENCOUNTER — Encounter: Payer: Self-pay | Admitting: Family Medicine

## 2017-12-17 VITALS — BP 118/70 | HR 73 | Temp 98.1°F | Resp 16 | Ht 70.0 in | Wt 213.0 lb

## 2017-12-17 DIAGNOSIS — M5442 Lumbago with sciatica, left side: Secondary | ICD-10-CM

## 2017-12-17 DIAGNOSIS — E785 Hyperlipidemia, unspecified: Secondary | ICD-10-CM

## 2017-12-17 DIAGNOSIS — D696 Thrombocytopenia, unspecified: Secondary | ICD-10-CM

## 2017-12-17 DIAGNOSIS — R739 Hyperglycemia, unspecified: Secondary | ICD-10-CM

## 2017-12-17 DIAGNOSIS — I429 Cardiomyopathy, unspecified: Secondary | ICD-10-CM | POA: Diagnosis not present

## 2017-12-17 DIAGNOSIS — J3089 Other allergic rhinitis: Secondary | ICD-10-CM

## 2017-12-17 DIAGNOSIS — J309 Allergic rhinitis, unspecified: Secondary | ICD-10-CM | POA: Insufficient documentation

## 2017-12-17 DIAGNOSIS — R350 Frequency of micturition: Secondary | ICD-10-CM

## 2017-12-17 DIAGNOSIS — G8929 Other chronic pain: Secondary | ICD-10-CM

## 2017-12-17 DIAGNOSIS — J432 Centrilobular emphysema: Secondary | ICD-10-CM

## 2017-12-17 DIAGNOSIS — N401 Enlarged prostate with lower urinary tract symptoms: Secondary | ICD-10-CM

## 2017-12-17 DIAGNOSIS — I10 Essential (primary) hypertension: Secondary | ICD-10-CM

## 2017-12-17 DIAGNOSIS — I739 Peripheral vascular disease, unspecified: Secondary | ICD-10-CM

## 2017-12-17 MED ORDER — IPRATROPIUM BROMIDE 0.06 % NA SOLN: 2.0000 | Freq: Four times a day (QID) | NASAL | 2 refills | Status: DC | PRN

## 2017-12-17 NOTE — Assessment & Plan Note (Signed)
Clinically stable COPD on prior imaging former smoker Emphasis on f/u with Cardiology for further management of DOE and cardiomyopathy Not on maintenance therapy - declines Follow-up PRN in future if flare, we can always reconsider maintenance and more aggressive treatment

## 2017-12-17 NOTE — Patient Instructions (Addendum)
Thank you for coming to the office today.  Please schedule and return for a NURSE ONLY VISIT for VACCINE - Approximately around October / November 2019 - Need High Dose Flu Vaccine  If you get at pharmacy can bring [place] a copy  For allergic rhinitis or nasal drip and congestion - Start Atrovent nasal spray decongestant 2 sprays in each nostril up to 4 times daily as needed - May use the Flonase in future if you prefer - this is longer term medicine may take weeks to work better  COPD As long as you are breathing well and no new flare up or concern. Notify us if you have a flare up and we can treat. - No maintenance inhaler at this time  Follow-up with Heart doctors Dr End  For Prostate and bladder - Continue on Finasteride 5mg  daily, I do recommend continuing this for now to help urine  Recommend contacting Dr Francisca December for chiropractor if need referral let me know.  DUE for FASTING BLOOD WORK (no food or drink after midnight before the lab appointment, only water or coffee without cream/sugar on the morning of)  SCHEDULE "Lab Only" visit in the morning at the clinic for lab draw in 5-6 months   - Make sure Lab Only appointment is at about 1 week before your next appointment, so that results will be available  For Lab Results, once available within 2-3 days of blood draw, you can can log in to MyChart online to view your results and a brief explanation. Also, we can discuss results at next follow-up visit.   Please schedule a Follow-up Appointment to: Return in about 5 months (around 05/19/2018) for yearly medicare checkup / AMW T[place].  If you have any other questions or concerns, please feel free to call the office or send a message through [place]. You may also schedule an earlier appointment if necessary.  Additionally, you may be receiving a survey about your experience at our office within a few days to 1 week by e-mail or mail. We value your feedback.  Nobie Putnam,  DO [place]

## 2017-12-17 NOTE — Assessment & Plan Note (Signed)
Followed by [facility] Improved platelet on last check

## 2017-12-17 NOTE — Assessment & Plan Note (Signed)
Identified by Cardiology on ECHO Also some diastolic CHF Clinically some symptoms of dyspnea on exertion Also underlying mild COPD Followed by [facility] [place] Dr End Now on statin, and BB Follow-up as planned

## 2017-12-17 NOTE — Progress Notes (Signed)
Subjective:    Patient ID: Raymond Barnett, male    DOB: 09/16/1929, 84 y.o.   MRN: [id number]  Raymond Barnett is a 84 y.o. male presenting on 12/17/2017 for COPD and Benign Prostatic Hypertrophy  Patient provides majority of history, accompanied by wife, Raymond Barnett.  HPI   FOLLOW-UP COPD Emphysema / DOE Last visit with me 08/2017, see note for background. Today he reports doing well. No respiratory or COPD flare ups. Only admits occasional difficulty at night with some sinus drainage causing some slight wheeze or cough. [facility] provider has given him nasal steroid he believes for this but not taking it regularly. - He does not have maintenance inhaler treatment, not interested - Not seen Pulm or had formal PFTs - He is former smoker Denies any dyspnea, chest pain or pressure, productive cough, wheezing  Allergic Rhinitis - Reports problem for past several months with nasal congestion and rhinorrhea. He tried OTC Decongestant. He has a nasal spray at home given to him by the [place]. Admits wife has similar sinus symptoms, she has used Flonase. Denies fever or chills, headache, hearing changes, nasal purulence  FOLLOW-UP BPH / Nocturia LUTS Previously referred to [facility] Dr Bernardo Heater in 04/2017, see prior discussion and note, he was trial Silodosin but ineffective, and prior med Tamsulosin gave him side-effect. Today he reports continues Finasteride 5mg  daily, he is unsure if he needs this one, no change in his urinary symptoms No new concerns. He tries to limit fluid in evening. He is not interested in returning to Urology at this time - Last [facility] BPH score 13, declined repeat today, he states no change. Admits nocturia mostly night time symptoms Denies urinary retention  Additional PMH - Cardiomyopathy and dyspnea on exertion / PAD / HLD - he is followed by [facility], Dr End, has apt coming up, ultimately had ECHO testing and further work-up, confirming abnormal ABI and PAD, he maintained on med  management with ASA and Rosuvastatin and BB, recently added, he re-checks lipids soon and monitoring in follow-up. He has declined further ischemic testing as of last visit.  Thrombocytopenia Followed by [facility], last check was improved. Denies bleeding or new concerns.  Chronic Wound of Lower Extremity - referral was sent recently to [facility] instead of [facility], due to patient request.  Health Maintenance: Due Flu vaccine, will return or get at pharmacy send [place] document.  Depression screen [facility] 2/9 12/17/2017 09/16/2017 05/06/2017  Decreased Interest 0 0 0  Down, Depressed, Hopeless 0 0 0  PHQ - 2 Score 0 0 0  Altered sleeping - - -  Tired, decreased energy - - -  Change in appetite - - -  Feeling bad or failure about yourself  - - -  Trouble concentrating - - -  Moving slowly or fidgety/restless - - -  Suicidal thoughts - - -  PHQ-9 Score - - -  Difficult doing work/chores - - -    Social History   Tobacco Use  . Smoking status: Former Smoker    Packs/day: 1.00    Years: 30.00    Pack years: 30.00    Types: Cigarettes    Last attempt to quit: 1995    Years since quitting: 24.6  . Smokeless tobacco: Former Network engineer Use Topics  . Alcohol use: Not Currently    Alcohol/week: 1.0 standard drinks    Types: 1 Glasses of wine per week    Frequency: Never  . Drug use: No  Review of Systems Per HPI unless specifically indicated above     Objective:    BP 118/70   Pulse 73   Temp 98.1 F (36.7 C) (Oral)   Resp 16   Ht 5\' 10"  (1.778 m)   Wt 213 lb (96.6 kg)   SpO2 98%   BMI 30.56 kg/m   Wt Readings from Last 3 Encounters:  12/17/17 213 lb (96.6 kg)  09/17/17 211 lb 8 oz (95.9 kg)  09/16/17 214 lb (97.1 kg)    Physical Exam  Constitutional: He is oriented to person, place, and time. He appears well-developed and well-nourished. No distress.  Well-appearing, comfortable, cooperative  HENT:  Head: Normocephalic and atraumatic.  Mouth/Throat: Oropharynx is  clear and moist.  Frontal / maxillary sinuses non-tender. Nares mostly patent mild deeper turbinate edema without purulence. Bilateral TMs clear without erythema, effusion or bulging. Oropharynx clear without erythema, exudates, edema or asymmetry.  Eyes: Conjunctivae are normal. Right eye exhibits no discharge. Left eye exhibits no discharge.  Neck: Normal range of motion. Neck supple. No thyromegaly present.  Cardiovascular: Normal rate, regular rhythm, normal heart sounds and intact distal pulses.  No murmur heard. Pulmonary/Chest: Effort normal and breath sounds normal. No respiratory distress. He has no wheezes. He has no rales.  Good air movement. No coughing.  Musculoskeletal: Normal range of motion. He exhibits no edema.  Lymphadenopathy:    He has no cervical adenopathy.  Neurological: He is alert and oriented to person, place, and time.  Skin: Skin is warm and dry. No rash noted. He is not diaphoretic. No erythema.  Psychiatric: He has a normal mood and affect. His behavior is normal.  Well groomed, good eye contact, normal speech and thoughts  Nursing note and vitals reviewed.  Results for orders placed or performed in visit on 09/16/17  Urine Culture  Result Value Ref Range   MICRO NUMBER: [id number]    SPECIMEN QUALITY: ADEQUATE    Sample Source URINE    STATUS: FINAL    Result: No Growth   POCT urinalysis dipstick  Result Value Ref Range   Color, UA amber    Clarity, UA clear    Glucose, UA Negative Negative   Bilirubin, UA negative    Ketones, UA negative    Spec Grav, UA 1.020 1.010 - 1.025   Blood, UA negative    pH, UA 7.0 5.0 - 8.0   Protein, UA Negative Negative   Urobilinogen, UA 0.2 0.2 or 1.0 E.U./dL   Nitrite, UA negative    Leukocytes, UA Negative Negative   Appearance clear    Odor none       Assessment & Plan:   Problem List Items Addressed This Visit    Allergic rhinitis due to allergen    Mild without obvious complication No secondary  bacterial or sinusitis  Start Atrovent nasal spray decongestant 2 sprays in each nostril up to 4 times daily PRN May additionally use previous Flonase as well longer term Continue loratadine      Relevant Medications   ipratropium (ATROVENT) 0.06 % nasal spray   Benign prostatic hyperplasia with urinary frequency    Stable chronic BPH with nocturia -Last 04/2017 [facility] BPH score 13 (moderate, no comparison score), improved on finasteride Failed alpha blockers (tamsulosin, silodosin) - No known personal/family history of prostate CA - [facility] Urology Dr Bernardo Heater 04/2017 - declined to return  Plan: 1. Continue Finasteride 5mg  daily - advised he may have worse BPH LUTS nocturia if he stops  this med 2. Reviewed behavioral / lifestyle techniques with fluid intake hydration, avoid inc fluid in PM, limit caffeine, timed voiding, double voiding 3. May return to Urology in future for 2nd opinion      Cardiomyopathy [facility])    Identified by Cardiology on ECHO Also some diastolic CHF Clinically some symptoms of dyspnea on exertion Also underlying mild COPD Followed by [facility] [place] Dr End Now on statin, and BB Follow-up as planned      COPD (chronic obstructive pulmonary disease) ([place]) - Primary    Clinically stable COPD on prior imaging former smoker Emphasis on f/u with Cardiology for further management of DOE and cardiomyopathy Not on maintenance therapy - declines Follow-up PRN in future if flare, we can always reconsider maintenance and more aggressive treatment      Relevant Medications   ipratropium (ATROVENT) 0.06 % nasal spray   Thrombocytopenia (H[facility])    Followed by [facility] Improved platelet on last check          Meds ordered this encounter  Medications  . ipratropium (ATROVENT) 0.06 % nasal spray    Sig: Place 2 sprays into both nostrils 4 (four) times daily as needed for rhinitis.    Dispense:  15 mL    Refill:  2      Follow up plan: Return in about 5 months (around  05/19/2018) for yearly medicare checkup / AMW [place].  Future labs ordered for 05/19/18  Nobie Putnam, [facility] 12/17/2017, 1:16 PM

## 2017-12-17 NOTE — Assessment & Plan Note (Signed)
Stable chronic BPH with nocturia -Last 04/2017 [facility] BPH score 13 (moderate, no comparison score), improved on finasteride Failed alpha blockers (tamsulosin, silodosin) - No known personal/family history of prostate CA - [facility] Urology Dr Bernardo Heater 04/2017 - declined to return  Plan: 1. Continue Finasteride 5mg  daily - advised he may have worse BPH LUTS nocturia if he stops this med 2. Reviewed behavioral / lifestyle techniques with fluid intake hydration, avoid inc fluid in PM, limit caffeine, timed voiding, double voiding 3. May return to Urology in future for 2nd opinion

## 2017-12-17 NOTE — Assessment & Plan Note (Signed)
Mild without obvious complication No secondary bacterial or sinusitis  Start Atrovent nasal spray decongestant 2 sprays in each nostril up to 4 times daily PRN May additionally use previous Flonase as well longer term Continue loratadine

## 2017-12-18 ENCOUNTER — Ambulatory Visit: Payer: Medicare Other | Admitting: Family Medicine

## 2017-12-24 ENCOUNTER — Encounter: Payer: Medicare Other | Attending: Internal Medicine | Admitting: Internal Medicine

## 2017-12-24 DIAGNOSIS — I872 Venous insufficiency (chronic) (peripheral): Secondary | ICD-10-CM | POA: Diagnosis not present

## 2017-12-24 DIAGNOSIS — Z96651 Presence of right artificial knee joint: Secondary | ICD-10-CM | POA: Insufficient documentation

## 2017-12-24 DIAGNOSIS — I70233 Atherosclerosis of native arteries of right leg with ulceration of ankle: Secondary | ICD-10-CM | POA: Diagnosis not present

## 2017-12-24 DIAGNOSIS — L97311 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 of skin: Secondary | ICD-10-CM | POA: Diagnosis not present

## 2017-12-24 DIAGNOSIS — Z87891 Personal history of nicotine dependence: Secondary | ICD-10-CM | POA: Diagnosis not present

## 2017-12-24 DIAGNOSIS — L97312 Non-pressure chronic ulcer of right ankle with fat layer exposed: Secondary | ICD-10-CM | POA: Diagnosis not present

## 2017-12-24 DIAGNOSIS — I10 Essential (primary) hypertension: Secondary | ICD-10-CM | POA: Insufficient documentation

## 2017-12-24 DIAGNOSIS — M069 Rheumatoid arthritis, unspecified: Secondary | ICD-10-CM | POA: Insufficient documentation

## 2017-12-25 NOTE — Progress Notes (Signed)
LION, Raymond Barnett ([id number]) Visit Report for 12/24/2017 Abuse/Suicide Risk Screen Details Patient Name: Raymond Barnett, Raymond Barnett Date of Service: 12/24/2017 10:30 AM Medical Record Number: [id number] Patient Account Number: [id number] Date of Birth/Sex: [birth year] (84 y.o. Male) Treating RN: Roger Shelter Primary Care Raechal Raben: Nobie Putnam Other Clinician: Referring Iliyana Convey: Nobie Putnam Treating Lavonne Cass/Extender: Tito Dine in Treatment: 0 Abuse/Suicide Risk Screen Items Answer ABUSE/SUICIDE RISK SCREEN: Has anyone close to you tried to hurt or harm you recentlyo No Do you feel uncomfortable with anyone in your familyo No Has anyone forced you do things that you didnot want to doo No Do you have any thoughts of harming yourselfo No Patient displays signs or symptoms of abuse and/or neglect. No Electronic Signature(s) Signed: 12/24/2017 12:03:29 PM By: Roger Shelter Entered By: Roger Shelter on 12/24/2017 10:56:54 Raymond Barnett ([id number]) -------------------------------------------------------------------------------- Activities of Daily Living Details Patient Name: Raymond Barnett Date of Service: 12/24/2017 10:30 AM Medical Record Number: [id number] Patient Account Number: [id number] Date of Birth/Sex: Oct 19, 1929 (84 y.o. Male) Treating RN: Roger Shelter Primary Care Laelynn Blizzard: Nobie Putnam Other Clinician: Referring Georgine Wiltse: Nobie Putnam Treating Xzayvier Fagin/Extender: Ricard Dillon Weeks in Treatment: 0 Activities of Daily Living Items Answer Activities of Daily Living (Please select one for each item) Drive Automobile Completely Able Take Medications Completely Able Use Telephone Completely Able Care for Appearance Completely Able Use Toilet Completely Able Bath / Shower Completely Able Dress Self Completely Able Feed Self Completely Able Walk Completely Able Get In / Out Bed Completely Able Housework Need  Assistance Prepare Meals Need Assistance Handle Money Completely Able Shop for Self Need Assistance Electronic Signature(s) Signed: 12/24/2017 12:03:29 PM By: Roger Shelter Entered By: Roger Shelter on 12/24/2017 10:57:29 Raymond Barnett ([id number]) -------------------------------------------------------------------------------- Education Assessment Details Patient Name: Raymond Barnett Date of Service: 12/24/2017 10:30 AM Medical Record Number: [id number] Patient Account Number: [id number] Date of Birth/Sex: March 13, 1930 (84 y.o. Male) Treating RN: Roger Shelter Primary Care Yvonnia Tango: Nobie Putnam Other Clinician: Referring Sahily Biddle: Nobie Putnam Treating Jahmiya Guidotti/Extender: Tito Dine in Treatment: 0 Primary Learner Assessed: Patient Learning Preferences/Education Level/Primary Language Learning Preference: Explanation Highest Education Level: High School Preferred Language: English Cognitive Barrier Assessment/Beliefs Language Barrier: No Translator Needed: No Memory Deficit: No Emotional Barrier: No Cultural/Religious Beliefs Affecting Medical Care: No Physical Barrier Assessment Impaired Vision: Yes Glasses Impaired Hearing: No Decreased Hand dexterity: No Knowledge/Comprehension Assessment Knowledge Level: High Comprehension Level: High Ability to understand written High instructions: Ability to understand verbal High instructions: Motivation Assessment Anxiety Level: Calm Cooperation: Cooperative Education Importance: Acknowledges Need Interest in Health Problems: Asks Questions Perception: Coherent Willingness to Engage in Self- High Management Activities: Readiness to Engage in Self- High Management Activities: Electronic Signature(s) Signed: 12/24/2017 12:03:29 PM By: Roger Shelter Entered By: Roger Shelter on 12/24/2017 10:57:57 Raymond Barnett  ([id number]) -------------------------------------------------------------------------------- Fall Risk Assessment Details Patient Name: Raymond Barnett Date of Service: 12/24/2017 10:30 AM Medical Record Number: [id number] Patient Account Number: [id number] Date of Birth/Sex: 02-09-1930 (84 y.o. Male) Treating RN: Roger Shelter Primary Care Consuela Widener: Nobie Putnam Other Clinician: Referring Zakar Brosch: Nobie Putnam Treating Ahjanae Cassel/Extender: Tito Dine in Treatment: 0 Fall Risk Assessment Items Have you had 2 or more falls in the last 12 monthso 0 No Have you had any fall that resulted in injury in the last 12 monthso 0 No FALL RISK ASSESSMENT: History of falling - immediate or within 3 months 0 No Secondary diagnosis 0 No Ambulatory aid None/bed rest/wheelchair/nurse 0 No Crutches/cane/walker 0 No Furniture 0  No IV Access/Saline Lock 0 No Gait/Training Normal/bed rest/immobile 0 No Weak 0 No Impaired 0 No Mental Status Oriented to own ability 0 No Electronic Signature(s) Signed: 12/24/2017 12:03:29 PM By: Roger Shelter Entered By: Roger Shelter on 12/24/2017 10:58:18 Raymond Barnett ([id number]) -------------------------------------------------------------------------------- Foot Assessment Details Patient Name: Raymond Barnett Date of Service: 12/24/2017 10:30 AM Medical Record Number: [id number] Patient Account Number: [id number] Date of Birth/Sex: 25-Nov-1929 (84 y.o. Male) Treating RN: Roger Shelter Primary Care Adline Kirshenbaum: Nobie Putnam Other Clinician: Referring Domonic Hiscox: Nobie Putnam Treating Trevor Duty/Extender: Ricard Dillon Weeks in Treatment: 0 Foot Assessment Items Site Locations + = Sensation present, - = Sensation absent, C = Callus, U = Ulcer R = Redness, W = Warmth, M = Maceration, PU = Pre-ulcerative lesion F = Fissure, S = Swelling, D = Dryness Assessment Right: Left: Other Deformity: No  No Prior Foot Ulcer: No No Prior Amputation: No No Charcot Joint: No No Ambulatory Status: Ambulatory With Help Assistance Device: Cane Gait: Steady Electronic Signature(s) Signed: 12/24/2017 12:03:29 PM By: Roger Shelter Entered By: Roger Shelter on 12/24/2017 10:59:30 Raymond Barnett ([id number]) -------------------------------------------------------------------------------- Nutrition Risk Assessment Details Patient Name: Raymond Barnett Date of Service: 12/24/2017 10:30 AM Medical Record Number: [id number] Patient Account Number: [id number] Date of Birth/Sex: 06/09/1929 (84 y.o. Male) Treating RN: Roger Shelter Primary Care Ab Leaming: Nobie Putnam Other Clinician: Referring Rickelle Sylvestre: Nobie Putnam Treating Alesi Zachery/Extender: Ricard Dillon Weeks in Treatment: 0 Height (in): 70 Weight (lbs): 211 Body Mass Index (BMI): 30.3 Nutrition Risk Assessment Items NUTRITION RISK SCREEN: I have an illness or condition that made me change the kind and/or amount of 0 No food I eat I eat fewer than two meals per day 0 No I eat few fruits and vegetables, or milk products 0 No I have three or more drinks of beer, liquor or wine almost every day 0 No I have tooth or mouth problems that make it hard for me to eat 0 No I don't always have enough money to buy the food I need 0 No I eat alone most of the time 0 No I take three or more different prescribed or over-the-counter drugs a day 0 No Without wanting to, I have lost or gained 10 pounds in the last six months 0 No I am not always physically able to shop, cook and/or feed myself 0 No Nutrition Protocols Good Risk Protocol 0 No interventions needed Moderate Risk Protocol Electronic Signature(s) Signed: 12/24/2017 12:03:29 PM By: Roger Shelter Entered By: Roger Shelter on 12/24/2017 10:58:24

## 2017-12-26 ENCOUNTER — Other Ambulatory Visit
Admission: RE | Admit: 2017-12-26 | Discharge: 2017-12-26 | Disposition: A | Discharge status: Home / Self Care | Payer: Medicare Other | Source: Ambulatory Visit | Attending: Internal Medicine | Admitting: Internal Medicine

## 2017-12-26 DIAGNOSIS — E785 Hyperlipidemia, unspecified: Secondary | ICD-10-CM

## 2017-12-26 DIAGNOSIS — Z79899 Other long term (current) drug therapy: Secondary | ICD-10-CM | POA: Insufficient documentation

## 2017-12-26 DIAGNOSIS — I10 Essential (primary) hypertension: Secondary | ICD-10-CM | POA: Diagnosis not present

## 2017-12-26 LAB — LIPID PANEL
CHOLESTEROL: 95 mg/dL (ref 0–200)
HDL: 40 mg/dL — ABNORMAL LOW (ref >40)
LDL Cholesterol: 42 mg/dL (ref 0–99)
TRIGLYCERIDES: 64 mg/dL (ref <150)
Total CHOL/HDL Ratio: 2.4 RATIO
VLDL: 13 mg/dL (ref 0–40)

## 2017-12-26 LAB — ALT: ALT: 20 U/L (ref 0–44)

## 2017-12-26 NOTE — Addendum Note (Signed)
Addended by: Santiago Bur on: 12/26/2017 10:42 AM   Modules accepted: Orders

## 2017-12-28 NOTE — Progress Notes (Signed)
GLORIA, LAMBERTSON ([id number]) Visit Report for 12/24/2017 Allergy List Details Patient Name: Raymond Barnett, Raymond Barnett Date of Service: 12/24/2017 10:30 AM Medical Record Number: [id number] Patient Account Number: [id number] Date of Birth/Sex: [birth year] (84 y.o. Male) Treating RN: Roger Shelter Primary Care Albertia Carvin: Nobie Putnam Other Clinician: Referring Ranae Casebier: Nobie Putnam Treating Khandi Kernes/Extender: Ricard Dillon Weeks in Treatment: 0 Allergies Active Allergies No Known Allergies Allergy Notes Electronic Signature(s) Signed: 12/24/2017 12:03:29 PM By: Roger Shelter Entered By: Roger Shelter on 12/24/2017 10:49:59 Raymond Barnett ([id number]) -------------------------------------------------------------------------------- Arrival Information Details Patient Name: Raymond Barnett Date of Service: 12/24/2017 10:30 AM Medical Record Number: [id number] Patient Account Number: [id number] Date of Birth/Sex: [birth year] (84 y.o. Male) Treating RN: Roger Shelter Primary Care Nazareth Norenberg: Nobie Putnam Other Clinician: Referring Zyen Triggs: Nobie Putnam Treating Zayonna Ayuso/Extender: Tito Dine in Treatment: 0 Visit Information Patient Arrived: Cane Arrival Time: 10:43 Accompanied By: wife Transfer Assistance: None Patient Identification Verified: Yes Secondary Verification Process Completed: Yes Electronic Signature(s) Signed: 12/24/2017 12:03:29 PM By: Roger Shelter Entered By: Roger Shelter on 12/24/2017 10:43:38 Raymond Barnett ([id number]) -------------------------------------------------------------------------------- Clinic Level of Care Assessment Details Patient Name: Raymond Barnett Date of Service: 12/24/2017 10:30 AM Medical Record Number: [id number] Patient Account Number: [id number] Date of Birth/Sex: [birth year] (84 y.o. Male) Treating RN: Cornell Barman Primary Care Kelia Gibbon: Nobie Putnam Other  Clinician: Referring Haislee Corso: Nobie Putnam Treating Winter Jocelyn/Extender: Tito Dine in Treatment: 0 Clinic Level of Care Assessment Items TOOL 1 Quantity Score []  - Use when EandM and Procedure is performed on INITIAL visit 0 ASSESSMENTS - Nursing Assessment / Reassessment X - General Physical Exam (combine w/ comprehensive assessment (listed just below) when 1 20 performed on new pt. evals) X- 1 25 Comprehensive Assessment (HX, ROS, Risk Assessments, Wounds Hx, etc.) ASSESSMENTS - Wound and Skin Assessment / Reassessment []  - Dermatologic / Skin Assessment (not related to wound area) 0 ASSESSMENTS - Ostomy and/or Continence Assessment and Care []  - Incontinence Assessment and Management 0 []  - 0 Ostomy Care Assessment and Management (repouching, etc.) PROCESS - Coordination of Care X - Simple Patient / Family Education for ongoing care 1 15 []  - 0 Complex (extensive) Patient / Family Education for ongoing care X- 1 10 Staff obtains Programmer, systems, Records, Test Results / Process Orders []  - 0 Staff telephones HHA, Nursing Homes / Clarify orders / etc []  - 0 Routine Transfer to another Facility (non-emergent condition) []  - 0 Routine Hospital Admission (non-emergent condition) X- 1 15 New Admissions / Biomedical engineer / Ordering NPWT, Apligraf, etc. []  - 0 Emergency Hospital Admission (emergent condition) PROCESS - Special Needs []  - Pediatric / Minor Patient Management 0 []  - 0 Isolation Patient Management []  - 0 Hearing / Language / Visual special needs []  - 0 Assessment of Community assistance (transportation, D/C planning, etc.) []  - 0 Additional assistance / Altered mentation []  - 0 Support Surface(s) Assessment (bed, cushion, seat, etc.) Raymond Barnett, Raymond Barnett ([id number]) INTERVENTIONS - Miscellaneous []  - External ear exam 0 []  - 0 Patient Transfer (multiple staff / Civil Service fast streamer / Similar devices) []  - 0 Simple Staple / Suture removal (25 or  less) []  - 0 Complex Staple / Suture removal (26 or more) []  - 0 Hypo/Hyperglycemic Management (do not check if billed separately) []  - 0 Ankle / Brachial Index (ABI) - do not check if billed separately Has the patient been seen at the hospital within the last three years: Yes Total Score: 85 Level Of Care: New/Established - Level 3 Electronic  Signature(s) Signed: 12/25/2017 5:22:10 PM By: Gretta Cool, BSN, RN, CWS, Kim RN, BSN Entered By: Gretta Cool, BSN, RN, CWS, Kim on 12/24/2017 11:30:19 Raymond Barnett ([id number]) -------------------------------------------------------------------------------- Encounter Discharge Information Details Patient Name: Raymond Barnett Date of Service: 12/24/2017 10:30 AM Medical Record Number: [id number] Patient Account Number: [id number] Date of Birth/Sex: 06-12-1929 (84 y.o. Male) Treating RN: Montey Hora Primary Care Tikisha Molinaro: Nobie Putnam Other Clinician: Referring Elio Haden: Nobie Putnam Treating Nasim Habeeb/Extender: Tito Dine in Treatment: 0 Encounter Discharge Information Items Discharge Condition: Stable Ambulatory Status: Ambulatory Discharge Destination: Home Transportation: Private Auto Accompanied By: spouse Schedule Follow-up Appointment: Yes Clinical Summary of Care: Electronic Signature(s) Signed: 12/24/2017 2:57:30 PM By: Montey Hora Entered By: Montey Hora on 12/24/2017 14:57:30 Raymond Barnett ([id number]) -------------------------------------------------------------------------------- Lower Extremity Assessment Details Patient Name: Raymond Barnett Date of Service: 12/24/2017 10:30 AM Medical Record Number: [id number] Patient Account Number: [id number] Date of Birth/Sex: [birth year] (84 y.o. Male) Treating RN: Roger Shelter Primary Care Somalia Segler: Nobie Putnam Other Clinician: Referring Vernie Piet: Nobie Putnam Treating Madeleine Fenn/Extender: Ricard Dillon Weeks in Treatment:  0 Edema Assessment Assessed: [Left: No] [Right: No] [Left: Edema] [Right: :] Calf Left: Right: Point of Measurement: 37 cm From Medial Instep cm 32 cm Ankle Left: Right: Point of Measurement: 14 cm From Medial Instep cm 23 cm Vascular Assessment Claudication: Claudication Assessment [Right:None] Pulses: Dorsalis Pedis Palpable: [Right:No] Doppler Audible: [Right:Yes] Posterior Tibial Extremity colors, hair growth, and conditions: Extremity Color: [Right:Hyperpigmented] Hair Growth on Extremity: [Right:No] Temperature of Extremity: [Right:Warm] Capillary Refill: [Right:< 3 seconds] Toe Nail Assessment Left: Right: Thick: Yes Discolored: Yes Deformed: No Improper Length and Hygiene: No Electronic Signature(s) Signed: 12/24/2017 12:03:29 PM By: Roger Shelter Entered By: Roger Shelter on 12/24/2017 11:07:40 Raymond Barnett ([id number]) -------------------------------------------------------------------------------- Multi Wound Chart Details Patient Name: Raymond Barnett Date of Service: 12/24/2017 10:30 AM Medical Record Number: [id number] Patient Account Number: [id number] Date of Birth/Sex: 07-Jul-1929 (84 y.o. Male) Treating RN: Cornell Barman Primary Care Rosey Eide: Nobie Putnam Other Clinician: Referring Mayson Mcneish: Nobie Putnam Treating Braison Snoke/Extender: Ricard Dillon Weeks in Treatment: 0 Vital Signs Height(in): 70 Pulse(bpm): 79 Weight(lbs): 211 Blood Pressure(mmHg): 145/72 Body Mass Index(BMI): 30 Temperature(F): 98.2 Respiratory Rate 18 (breaths/min): Photos: [N/A:N/A] Wound Location: Right Lower Leg - Lateral N/A N/A Wounding Event: Other Lesion N/A N/A Primary Etiology: Venous Leg Ulcer N/A N/A Comorbid History: Glaucoma, Hypertension, N/A N/A Peripheral Venous Disease, Rheumatoid Arthritis Date Acquired: 11/20/2017 N/A N/A Weeks of Treatment: 0 N/A N/A Wound Status: Open N/A N/A Measurements L x W x D 1x0.8x0.2 N/A  N/A (cm) Area (cm) : 0.628 N/A N/A Volume (cm) : 0.126 N/A N/A % Reduction in Area: 0.00% N/A N/A % Reduction in Volume: 0.00% N/A N/A Position 1 (o'clock): 10 Maximum Distance 1 (cm): 0.4 Tunneling: Yes N/A N/A Classification: Full Thickness Without N/A N/A Exposed Support Structures Exudate Amount: Medium N/A N/A Exudate Type: Serous N/A N/A Exudate Color: amber N/A N/A Wound Margin: Distinct, outline attached N/A N/A Granulation Amount: Medium (34-66%) N/A N/A Granulation Quality: Red, Pink N/A N/A Necrotic Amount: Medium (34-66%) N/A N/A Exposed Structures: Fat Layer (Subcutaneous N/A N/A Tissue) Exposed: Yes Raymond Barnett, Raymond Barnett ([id number]) Fascia: No Tendon: No Muscle: No Joint: No Bone: No Epithelialization: None N/A N/A Debridement: Debridement - Selective/Open N/A N/A Wound Pre-procedure 11:22 N/A N/A Verification/Time Out Taken: Pain Control: Other N/A N/A Tissue Debrided: Slough N/A N/A Level: Non-Viable Tissue N/A N/A Debridement Area (sq cm): 0.8 N/A N/A Instrument: Curette N/A N/A Bleeding: None N/A N/A Debridement Treatment Procedure  was tolerated well N/A N/A Response: Post Debridement 1x0.8x0.2 N/A N/A Measurements L x W x D (cm) Post Debridement Volume: 0.126 N/A N/A (cm) Periwound Skin Texture: Excoriation: No N/A N/A Induration: No Callus: No Crepitus: No Rash: No Scarring: No Periwound Skin Moisture: Maceration: No N/A N/A Dry/Scaly: No Periwound Skin Color: Atrophie Blanche: No N/A N/A Cyanosis: No Ecchymosis: No Erythema: No Hemosiderin Staining: No Mottled: No Pallor: No Rubor: No Temperature: No Abnormality N/A N/A Tenderness on Palpation: No N/A N/A Wound Preparation: Ulcer Cleansing: N/A N/A Rinsed/Irrigated with Saline Topical Anesthetic Applied: Other: lidocaine 4% Procedures Performed: Debridement N/A N/A Treatment Notes Electronic Signature(s) Signed: 12/24/2017 4:51:46 PM By: Linton Ham MD Entered By:  Linton Ham on 12/24/2017 12:52:43 Raymond Barnett ([id number]) -------------------------------------------------------------------------------- Multi-Disciplinary Care Plan Details Patient Name: Raymond Barnett Date of Service: 12/24/2017 10:30 AM Medical Record Number: [id number] Patient Account Number: [id number] Date of Birth/Sex: Apr 06, 1930 (84 y.o. Male) Treating RN: Cornell Barman Primary Care Eldena Dede: Nobie Putnam Other Clinician: Referring Wallie Lagrand: Nobie Putnam Treating Aydeen Blume/Extender: Tito Dine in Treatment: 0 Active Inactive ` Orientation to the Wound Care Program Nursing Diagnoses: Knowledge deficit related to the wound healing center program Goals: Patient/caregiver will verbalize understanding of the [place] Program Date Initiated: 12/24/2017 Target Resolution Date: 01/24/2018 Goal Status: Active Interventions: Provide education on orientation to the wound center Notes: ` Wound/Skin Impairment Nursing Diagnoses: Impaired tissue integrity Knowledge deficit related to ulceration/compromised skin integrity Goals: Ulcer/skin breakdown will have a volume reduction of 30% by week 4 Date Initiated: 12/24/2017 Target Resolution Date: 01/24/2018 Goal Status: Active Interventions: Provide education on ulcer and skin care Treatment Activities: Topical wound management initiated : 12/24/2017 Notes: Electronic Signature(s) Signed: 12/25/2017 5:22:10 PM By: Gretta Cool, BSN, RN, CWS, Kim RN, BSN Entered By: Gretta Cool, BSN, RN, CWS, Kim on 12/24/2017 11:17:13 Raymond Barnett ([id number]) -------------------------------------------------------------------------------- Pain Assessment Details Patient Name: Raymond Barnett Date of Service: 12/24/2017 10:30 AM Medical Record Number: [id number] Patient Account Number: [id number] Date of Birth/Sex: 1929-10-04 (84 y.o. Male) Treating RN: Roger Shelter Primary Care Decie Verne: Nobie Putnam Other Clinician: Referring Jannell Franta: Nobie Putnam Treating Kynzee Devinney/Extender: Ricard Dillon Weeks in Treatment: 0 Active Problems Location of Pain Severity and Description of Pain Patient Has Paino No Site Locations Pain Management and Medication Current Pain Management: Electronic Signature(s) Signed: 12/24/2017 12:03:29 PM By: Roger Shelter Entered By: Roger Shelter on 12/24/2017 10:44:24 Raymond Barnett ([id number]) -------------------------------------------------------------------------------- Patient/Caregiver Education Details Patient Name: Raymond Barnett Date of Service: 12/24/2017 10:30 AM Medical Record Number: [id number] Patient Account Number: [id number] Date of Birth/Gender: 05-01-1929 (84 y.o. Male) Treating RN: Montey Hora Primary Care Physician: Nobie Putnam Other Clinician: Referring Physician: Nobie Putnam Treating Physician/Extender: Tito Dine in Treatment: 0 Education Assessment Education Provided To: Patient and Caregiver Education Topics Provided Wound/Skin Impairment: Handouts: Other: wound care as ordered Methods: Demonstration, Explain/Verbal Responses: State content correctly Electronic Signature(s) Signed: 12/24/2017 4:57:10 PM By: Montey Hora Entered By: Montey Hora on 12/24/2017 14:57:47 Raymond Barnett ([id number]) -------------------------------------------------------------------------------- Wound Assessment Details Patient Name: Raymond Barnett Date of Service: 12/24/2017 10:30 AM Medical Record Number: [id number] Patient Account Number: [id number] Date of Birth/Sex: 1929/05/22 (84 y.o. Male) Treating RN: Roger Shelter Primary Care Brooks Kinnan: Nobie Putnam Other Clinician: Referring Modena Bellemare: Nobie Putnam Treating Xanthe Couillard/Extender: Ricard Dillon Weeks in Treatment: 0 Wound Status Wound Number: 1 Primary Venous Leg Ulcer Etiology: Wound  Location: Right Lower Leg - Lateral Wound Open Wounding Event: Other Lesion Status: Date Acquired: 11/20/2017 Comorbid Glaucoma,  Hypertension, Peripheral Venous Weeks Of Treatment: 0 History: Disease, Rheumatoid Arthritis Clustered Wound: No Photos Photo Uploaded By: Roger Shelter on 12/24/2017 11:56:09 Wound Measurements Length: (cm) 1 Width: (cm) 0.8 Depth: (cm) 0.2 Area: (cm) 0.628 Volume: (cm) 0.126 % Reduction in Area: 0% % Reduction in Volume: 0% Epithelialization: None Tunneling: Yes Position (o'clock): 10 Maximum Distance: (cm) 0.4 Undermining: No Wound Description Full Thickness Without Exposed Support Classification: Structures Wound Margin: Distinct, outline attached Exudate Medium Amount: Exudate Type: Serous Exudate Color: amber Foul Odor After Cleansing: No Slough/Fibrino Yes Wound Bed Granulation Amount: Medium (34-66%) Exposed Structure Granulation Quality: Red, Pink Fascia Exposed: No Necrotic Amount: Medium (34-66%) Fat Layer (Subcutaneous Tissue) Exposed: Yes Necrotic Quality: Adherent Slough Tendon Exposed: No TEEJAY, MEADER ([id number]) Muscle Exposed: No Joint Exposed: No Bone Exposed: No Periwound Skin Texture Texture Color No Abnormalities Noted: No No Abnormalities Noted: No Callus: No Atrophie Blanche: No Crepitus: No Cyanosis: No Excoriation: No Ecchymosis: No Induration: No Erythema: No Rash: No Hemosiderin Staining: No Scarring: No Mottled: No Pallor: No Moisture Rubor: No No Abnormalities Noted: No Dry / Scaly: No Temperature / Pain Maceration: No Temperature: No Abnormality Wound Preparation Ulcer Cleansing: Rinsed/Irrigated with Saline Topical Anesthetic Applied: Other: lidocaine 4%, Treatment Notes Wound #1 (Right, Lateral Lower Leg) 1. Cleansed with: Clean wound with Normal Saline 2. Anesthetic Topical Lidocaine 4% cream to wound bed prior to debridement 4. Dressing Applied: Other dressing  (specify in notes) 5. Secondary Dressing Applied Bordered Foam Dressing Notes endoform Electronic Signature(s) Signed: 12/24/2017 12:03:29 PM By: Roger Shelter Signed: 12/25/2017 5:22:10 PM By: Gretta Cool, BSN, RN, CWS, Kim RN, BSN Previous Signature: 12/24/2017 11:15:36 AM Version By: Roger Shelter Entered By: Gretta Cool BSN, RN, CWS, Kim on 12/24/2017 11:24:29 Raymond Barnett ([id number]) -------------------------------------------------------------------------------- [place] Details Patient Name: Raymond Barnett Date of Service: 12/24/2017 10:30 AM Medical Record Number: [id number] Patient Account Number: [id number] Date of Birth/Sex: 10/02/1929 (84 y.o. Male) Treating RN: Roger Shelter Primary Care Tineshia Becraft: Nobie Putnam Other Clinician: Referring Aaniya Sterba: Nobie Putnam Treating Reilyn Nelson/Extender: Ricard Dillon Weeks in Treatment: 0 Vital Signs Time Taken: 10:48 Temperature (F): 98.2 Height (in): 70 Pulse (bpm): 79 Source: Stated Respiratory Rate (breaths/min): 18 Weight (lbs): 211 Blood Pressure (mmHg): 145/72 Source: Measured Reference Range: 80 - 120 mg / dl Body Mass Index (BMI): 30.3 Electronic Signature(s) Signed: 12/24/2017 12:03:29 PM By: Roger Shelter Entered By: Roger Shelter on 12/24/2017 10:48:57

## 2017-12-28 NOTE — Progress Notes (Signed)
ZAHID, CARNEIRO ([id number]) Visit Report for 12/24/2017 Chief Complaint Document Details Patient Name: Raymond Barnett, Raymond Barnett Date of Service: 12/24/2017 10:30 AM Medical Record Number: [id number] Patient Account Number: [id number] Date of Birth/Sex: 1930/04/20 (84 y.o. Male) Treating RN: Cornell Barman Primary Care Provider: Nobie Putnam Other Clinician: Referring Provider: Nobie Putnam Treating Provider/Extender: Tito Dine in Treatment: 0 Information Obtained from: Patient Chief Complaint 12/24/17; patient is here for review of a refractory wound on the right lateral ankle area. Electronic Signature(s) Signed: 12/24/2017 4:51:46 PM By: Linton Ham MD Entered By: Linton Ham on 12/24/2017 12:53:29 Raymond Barnett ([id number]) -------------------------------------------------------------------------------- Debridement Details Patient Name: Raymond Barnett Date of Service: 12/24/2017 10:30 AM Medical Record Number: [id number] Patient Account Number: [id number] Date of Birth/Sex: [birth year] (84 y.o. Male) Treating RN: Cornell Barman Primary Care Provider: Nobie Putnam Other Clinician: Referring Provider: Nobie Putnam Treating Provider/Extender: Tito Dine in Treatment: 0 Debridement Performed for Wound #1 Right,Lateral Lower Leg Assessment: Performed By: Physician Ricard Dillon, MD Debridement Type: Debridement Severity of Tissue Pre Fat layer exposed Debridement: Pre-procedure Verification/Time Yes - 11:22 Out Taken: Start Time: 11:22 Pain Control: Other : lidocaine 4% Total Area Debrided (L x W): 1 (cm) x 0.8 (cm) = 0.8 (cm) Tissue and other material Non-Viable, [place] debrided: Level: Non-Viable Tissue Debridement Description: Selective/Open Wound Instrument: Curette Bleeding: None End Time: 11:24 Response to Treatment: Procedure was tolerated well Level of Consciousness: Awake and Alert Post  Debridement Measurements of Total Wound Length: (cm) 1 Width: (cm) 0.8 Depth: (cm) 0.2 Volume: (cm) 0.126 Character of Wound/Ulcer Post Debridement: Requires Further Debridement Severity of Tissue Post Debridement: Fat layer exposed Post Procedure Diagnosis Same as Pre-procedure Electronic Signature(s) Signed: 12/24/2017 4:51:46 PM By: Linton Ham MD Signed: 12/25/2017 5:22:10 PM By: Gretta Cool, BSN, RN, CWS, Kim RN, BSN Entered By: Linton Ham on 12/24/2017 12:53:06 Raymond Barnett ([id number]) -------------------------------------------------------------------------------- HPI Details Patient Name: Raymond Barnett Date of Service: 12/24/2017 10:30 AM Medical Record Number: [id number] Patient Account Number: [id number] Date of Birth/Sex: [birth year] (84 y.o. Male) Treating RN: Cornell Barman Primary Care Provider: Nobie Putnam Other Clinician: Referring Provider: Nobie Putnam Treating Provider/Extender: Ricard Dillon Weeks in Treatment: 0 History of Present Illness Severity: furthermore the TBI was 0.33 on the right HPI Description: ADMISSION 12/24/17 This is an 84 year old man who arrives for review of wound on his right lateral ankle area. He states this happened about a year ago when he picked the scab off the area and the wound opened and never really healed. He has been followed by Dr. Governor Specking at [facility] wound care center. He is apparently used a multitude of topical dressings as well as skin substitutes however I'm not certain at this point with scattered skin substitutes were used. His last visit there was on 10/09/17 he's been using sorbact with hydrogel and a covering dressing. He states the wound is actually gotten better with less depth and wound volume. He is frustrated by the slowness of the healing and he is coming here for review. He also lives in [place] and this is a lot closer for him in terms of traveling. I have not had a chance  to look through all of Dr. Samule Ohm notes however the wound was listed as a venous ulcer at one point. He had an x-ray done in December 2018 that did not show evidence of osteomyelitis. According to the patient osteomyelitis was ruled out. The patient actually has a complex history in this area.  He suffered a serious fracture in the area perhaps 60 years ago while he was in the [employer] when [employer] over his ankle. He had a multitude of surgeries, bone graft and ultimately an ankle fusion. Apparently all the hardware however has been taken out of the ankle quite a while ago. He does not however have a prior wound history. The patient also saw his cardiologist in May who appropriately ordered vascular tests. This showed an ABI in the right of 0.6 on the left of 1.07. This showed monophasic waveforms and all of the tibial vessels. 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 Also at 30-49% stenosis of the distal popliteal. furthermore TBI was 0.33 on the right which is markedly reduced. Looking at Dr. Marisue Humble Notes the patient refused to see an interventionalist Electronic Signature(s) Signed: 12/24/2017 4:51:46 PM By: Linton Ham MD Entered By: Linton Ham on 12/24/2017 13:02:23 Raymond Barnett ([id number]) -------------------------------------------------------------------------------- Physical Exam Details Patient Name: Raymond Barnett Date of Service: 12/24/2017 10:30 AM Medical Record Number: [id number] Patient Account Number: [id number] Date of Birth/Sex: 12/01/1929 (84 y.o. Male) Treating RN: Cornell Barman Primary Care Provider: Nobie Putnam Other Clinician: Referring Provider: Nobie Putnam Treating Provider/Extender: Ricard Dillon Weeks in Treatment: 0 Constitutional Patient is hypertensive.. Pulse regular and within target range for patient.Marland Kitchen Respirations regular, non-labored and within target range.. Temperature is normal and within the target range for the  patient.Marland Kitchen appears in no distress. Eyes Conjunctivae clear. No discharge. Respiratory Respiratory effort is easy and symmetric bilaterally. Rate is normal at rest and on room air.. Bilateral breath sounds are clear and equal in all lobes with no wheezes, rales or rhonchi.. Cardiovascular Heart rhythm and rate regular, without murmur or gallop.. femoral and popliteal pulses are palpable on the right. pedal pulses are also palpable maybe not normal but certainly palpable.. there is some chronic venous insufficiency on the right but no edema. Gastrointestinal (GI) Abdomen is soft and non-distended without masses or tenderness. Bowel sounds active in all quadrants.. Lymphatic none palpable in the popliteal or inguinal area. Musculoskeletal surgical scars on the right tibial area. He is at a total knee replacement on the right. Integumentary (Hair, Skin) no primary skin issues are seen. Psychiatric No evidence of depression, anxiety, or agitation. Calm, cooperative, and communicative. Appropriate interactions and affect.. Electronic Signature(s) Signed: 12/24/2017 4:51:46 PM By: Linton Ham MD Entered By: Linton Ham on 12/24/2017 13:13:10 Raymond Barnett ([id number]) -------------------------------------------------------------------------------- Physician Orders Details Patient Name: Raymond Barnett Date of Service: 12/24/2017 10:30 AM Medical Record Number: [id number] Patient Account Number: [id number] Date of Birth/Sex: [birth year] (84 y.o. Male) Treating RN: Cornell Barman Primary Care Provider: Nobie Putnam Other Clinician: Referring Provider: Nobie Putnam Treating Provider/Extender: Tito Dine in Treatment: 0 Verbal / Phone Orders: No Diagnosis Coding Wound Cleansing Wound #1 Right,Lateral Lower Leg o Clean wound with Normal Saline. Anesthetic (add to Medication List) Wound #1 Right,Lateral Lower Leg o Topical Lidocaine 4% cream applied to  wound bed prior to debridement (In Clinic Only). Primary Wound Dressing Wound #1 Right,Lateral Lower Leg o Other: - Endoform (also pack into tunnel at 10:00) Secondary Dressing Wound #1 Right,Lateral Lower Leg o Boardered Foam Dressing Dressing Change Frequency Wound #1 Right,Lateral Lower Leg o Change Dressing Monday, Wednesday, Friday Follow-up Appointments Wound #1 Right,Lateral Lower Leg o Return Appointment in 1 week. Additional Orders / Instructions Wound #1 Right,Lateral Lower Leg o Increase protein intake. Consults o Vascular - Follow-up with Dr. Gwenlyn Found to discuss arterial studies. Electronic Signature(s)  Signed: 12/24/2017 4:51:46 PM By: Linton Ham MD Signed: 12/25/2017 5:22:10 PM By: Gretta Cool, BSN, RN, CWS, Kim RN, BSN Entered By: Gretta Cool, BSN, RN, CWS, Kim on 12/24/2017 11:29:25 Raymond Barnett ([id number]) -------------------------------------------------------------------------------- Problem List Details Patient Name: Raymond Barnett Date of Service: 12/24/2017 10:30 AM Medical Record Number: [id number] Patient Account Number: [id number] Date of Birth/Sex: 10-05-1929 (84 y.o. 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 Male) Treating RN: Cornell Barman Primary Care Provider: Nobie Putnam Other Clinician: Referring Provider: Nobie Putnam Treating Provider/Extender: Ricard Dillon Weeks in Treatment: 0 Subjective Chief Complaint Information obtained from Patient 12/24/17; patient is here for review of a refractory wound on the right lateral ankle area. History of Present Illness (HPI) The following HPI elements were documented for the patient's wound: Severity: furthermore the TBI was 0.33 on the right ADMISSION 12/24/17 This is an 84 year old man who arrives for review of wound on his right lateral ankle area. He states this happened about a year ago when he picked the scab off the area and the wound opened and never really healed. He has been followed by Dr. Governor Specking at [facility] wound care center. He is apparently used a multitude of topical dressings as well as skin substitutes however I'm not certain at this point with scattered skin substitutes were used. His last visit there was on 10/09/17 he's been using sorbact with hydrogel and a covering dressing. He states the wound is actually gotten better with less depth and wound volume. He is frustrated by the slowness of the healing and he is coming here for review. He also lives in [place] and this is a lot closer for him in terms of traveling. I have not had a chance to look through all of Dr. Samule Ohm notes however the wound was listed as a venous ulcer at one point. He had an x-ray done in December 2018 that did not show evidence of osteomyelitis. According to the patient osteomyelitis was ruled out. The patient actually has a complex history in this area. He suffered a serious fracture in the area perhaps 60 years ago while he was in the [employer] when [employer] over his ankle. He had a multitude of surgeries, bone graft and ultimately an ankle fusion. Apparently all the hardware however has been taken out of the ankle quite a while ago. He does not however have a prior wound history. The patient also saw his cardiologist in May who appropriately ordered vascular tests. This showed an ABI in the right of 0.6 on the left of 1.07. This showed monophasic waveforms and all of the tibial vessels. 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 Also at 30-49% stenosis of the distal popliteal. furthermore TBI was 0.33 on the right which is markedly reduced. Looking at Dr. Marisue Humble Notes the patient refused to see an  interventionalist Wound History Patient presents with 1 open wound. Laboratory tests have been performed in the last month. Patient reportedly has tested positive for an antibiotic resistant organism. Patient History Allergies No Known Allergies Family History Cancer - Mother, Heart Disease - Father, Hypertension - Father, No family history of Diabetes, Kidney Disease, Lung Disease, Seizures, Stroke, Thyroid Problems, Tuberculosis. Social History Raymond Barnett, Raymond Barnett ([id number]) Former smoker - quit 35 years ago, Marital Status - Married, Alcohol Use - Rarely, Drug Use - No History, Caffeine Use - Daily. Medical History Eyes Patient has history of Glaucoma - bilateral Denies history of Cataracts, Optic Neuritis Ear/Nose/Mouth/Throat Denies history of Chronic sinus problems/congestion, Middle ear problems Hematologic/Lymphatic Denies history of Anemia, Hemophilia, Human Immunodeficiency Virus, Lymphedema, Sickle Cell Disease Respiratory Denies history of Aspiration, Asthma, Chronic Obstructive Pulmonary Disease (COPD), Pneumothorax, Sleep Apnea, Tuberculosis Cardiovascular Patient has history of Hypertension, Peripheral Venous Disease Denies history of Angina, Arrhythmia, Congestive Heart Failure, Coronary Artery Disease, Deep Vein  Thrombosis, Hypotension, Myocardial Infarction, Peripheral Arterial Disease, Phlebitis, Vasculitis Gastrointestinal Denies history of Cirrhosis , Colitis, Crohn s, Hepatitis A, Hepatitis B, Hepatitis C Endocrine Denies history of Type I Diabetes, Type II Diabetes Genitourinary Denies history of End Stage Renal Disease Immunological Denies history of Lupus Erythematosus, Raynaud s, Scleroderma Integumentary (Skin) Denies history of History of Burn, History of pressure wounds Musculoskeletal Patient has history of Rheumatoid Arthritis Denies history of Gout, Osteoarthritis, Osteomyelitis Neurologic Denies history of Dementia, Neuropathy, Quadriplegia, Paraplegia, Seizure Disorder Psychiatric Denies history of Anorexia/bulimia, Confinement Anxiety Review of Systems (ROS) Constitutional Symptoms (General Health) Denies complaints or symptoms of Fatigue, Fever, Chills, Marked Weight Change. Eyes Complains or has symptoms of Dry Eyes, Glasses / Contacts - glasses. Denies complaints or symptoms of Vision Changes. Ear/Nose/Mouth/Throat Denies complaints or symptoms of Difficult clearing ears, Sinusitis, tinnitis ringing in ears Hematologic/Lymphatic Denies complaints or symptoms of Bleeding / Clotting Disorders, Human Immunodeficiency Virus. Respiratory Complains or has symptoms of Shortness of Breath - with exertion. Gastrointestinal Denies complaints or symptoms of Frequent diarrhea, Nausea, Vomiting. Endocrine Denies complaints or symptoms of Hepatitis, Thyroid disease, Polydypsia (Excessive Thirst). Genitourinary Denies complaints or symptoms of Kidney failure/ Dialysis, Incontinence/dribbling. Immunological Denies complaints or symptoms of Hives, Itching. Integumentary (Skin) Complains or has symptoms of Wounds. Denies complaints or symptoms of Bleeding or bruising tendency, Breakdown. Musculoskeletal JALAL, RAUCH ([id number]) Denies complaints or symptoms of Muscle Pain,  Muscle Weakness, right knee replacement Neurologic Denies complaints or symptoms of Numbness/parasthesias, Focal/Weakness. Oncologic The patient has no complaints or symptoms. Psychiatric Denies complaints or symptoms of Anxiety, Claustrophobia. Objective Constitutional Patient is hypertensive.. Pulse regular and within target range for patient.Marland Kitchen Respirations regular, non-labored and within target range.. Temperature is normal and within the target range for the patient.Marland Kitchen appears in no distress. Vitals Time Taken: 10:48 AM, Height: 70 in, Source: Stated, Weight: 211 lbs, Source: Measured, BMI: 30.3, Temperature: 98.2 F, Pulse: 79 bpm, Respiratory Rate: 18 breaths/min, Blood Pressure: 145/72 mmHg. Eyes Conjunctivae clear. No discharge. Respiratory Respiratory effort is easy and symmetric bilaterally. Rate is normal at rest and on room air.. Bilateral breath sounds are clear and equal in all lobes with no wheezes, rales or rhonchi.. Cardiovascular Heart rhythm and rate regular, without murmur or gallop.. femoral and popliteal pulses are palpable on the right. pedal pulses are also palpable maybe not normal but certainly palpable.. there is some chronic venous insufficiency on the right but no edema. Gastrointestinal (GI) Abdomen is soft and non-distended without masses or tenderness. Bowel sounds active in all  quadrants.. Lymphatic none palpable in the popliteal or inguinal area. Musculoskeletal surgical scars on the right tibial area. He is at a total knee replacement on the right. Psychiatric No evidence of depression, anxiety, or agitation. Calm, cooperative, and communicative. Appropriate interactions and affect.. Integumentary (Hair, Skin) no primary skin issues are seen. Wound #1 status is Open. Original cause of wound was Other Lesion. The wound is located on the Right,Lateral Lower Leg. The wound measures 1cm length x 0.8cm width x 0.2cm depth; 0.628cm^2 area and 0.126cm^3  volume. There is Fat Layer (Subcutaneous Tissue) Exposed exposed. There is no undermining noted, however, there is tunneling at 10:00 with a maximum distance of 0.4cm. There is a medium amount of serous drainage noted. The wound margin is distinct with the Raymond Barnett, Raymond Barnett ([id number]) outline attached to the wound base. There is medium (34-66%) red, pink granulation within the wound bed. There is a medium (34-66%) amount of necrotic tissue within the wound bed including Adherent Slough. The periwound skin appearance did not exhibit: Callus, Crepitus, Excoriation, Induration, Rash, Scarring, Dry/Scaly, Maceration, Atrophie Blanche, Cyanosis, Ecchymosis, Hemosiderin Staining, Mottled, Pallor, Rubor, Erythema. Periwound temperature was noted as No Abnormality. 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 There was a Selective/Open Wound Non-Viable Tissue Debridement with a total area of 0.8 sq cm performed by Ricard Dillon, MD. With the following instrument(s): Curette to remove Non-Viable tissue/material. Material removed includes [facility] after achieving pain control using Other (lidocaine 4%). No specimens were taken. A time out was conducted at 11:22, prior to the start of the procedure. There was no bleeding. The procedure was tolerated well. Patient s Level of Consciousness post procedure was recorded as Awake and Alert. Post Debridement Measurements: 1cm length x 0.8cm width x 0.2cm depth; 0.126cm^3 volume. Character of Wound/Ulcer Post Debridement requires further debridement. Severity of Tissue Post Debridement is: Fat layer exposed. Post procedure Diagnosis Wound #1: Same as  Pre-Procedure Plan Wound Cleansing: Wound #1 Right,Lateral Lower Leg: Clean wound with Normal Saline. Anesthetic (add to Medication List): Wound #1 Right,Lateral Lower Leg: Topical Lidocaine 4% cream applied to wound bed prior to debridement (In Clinic Only). Primary Wound Dressing: Wound #1 Right,Lateral Lower Leg: Other: - Endoform (also pack into tunnel at 10:00) Secondary Dressing: Wound #1 Right,Lateral Lower Leg: Boardered Foam Dressing Dressing Change Frequency: Wound #1 Right,Lateral Lower Leg: Change Dressing Monday, Wednesday, Friday Raymond Barnett, Raymond Barnett ([id number]) Follow-up Appointments: Wound #1 Right,Lateral Lower Leg: Return Appointment in 1 week. Additional Orders / Instructions: Wound #1 Right,Lateral Lower Leg: Increase protein intake. Consults ordered were: Vascular - Follow-up with Dr. Gwenlyn Found to discuss arterial studies. #1 change the primary dressing to Endoform border foam #2 I think the patient's arterial studies need to be addressed with an interventional cardiologist. Similar to what Dr. END recorded in his notes I didn't think the patient gave me permission to do this. I tried to explain to him that this may all be all arterial in terms of the nonhealing and that simple procedures are reasonably simple procedures are available to address this. Paradoxically the patient is seeing Dr.End next week hopefully he will reiterate this. #3 the wound itself did not look too unhealthy. There is a small probing tunnel I am  hopeful we can get further granulation of this area with Endoform. #4 at this point I didn't think any additional imaging studies were necessary. No cultures. He apparently does not have osteomyelitis per the [facility] notes. #5 by next week I should be able to review of all of Dr. Talitha Givens notes. Electronic Signature(s) Signed: 12/24/2017 4:51:46 PM By: Linton Ham MD Entered By: Linton Ham on 12/24/2017 13:16:08 Raymond Barnett  ([id number]) -------------------------------------------------------------------------------- ROS/PFSH Details Patient Name: Raymond Barnett Date of Service: 12/24/2017 10:30 AM Medical Record Number: [id number] Patient Account Number: [id number] Date of Birth/Sex: 18-Aug-1929 (84 y.o. Male) Treating RN: Roger Shelter Primary Care Provider: Nobie Putnam Other Clinician: Referring Provider: Nobie Putnam Treating Provider/Extender: Ricard Dillon Weeks in Treatment: 0 Wound History Do you currently have one or more open woundso Yes How many open wounds do you currently haveo 1 Constitutional Symptoms (General Health) Complaints and Symptoms: Negative for: Fatigue; Fever; Chills; Marked Weight Change Eyes Complaints and Symptoms: Positive for: Dry Eyes; Glasses / Contacts - glasses Negative for: Vision Changes Medical History: Positive for: Glaucoma - bilateral Negative for: Cataracts; Optic Neuritis Ear/Nose/Mouth/Throat Complaints and Symptoms: Negative for: Difficult clearing ears; Sinusitis Review of System Notes: tinnitis ringing in ears Medical History: Negative for: Chronic sinus problems/congestion; Middle ear problems Hematologic/Lymphatic Complaints and Symptoms: Negative for: Bleeding / Clotting Disorders; Human Immunodeficiency Virus Medical History: Negative for: Anemia; Hemophilia; Human Immunodeficiency Virus; Lymphedema; Sickle Cell Disease Respiratory Complaints and Symptoms: Positive for: Shortness of Breath - with exertion Medical History: Negative for: Aspiration; Asthma; Chronic Obstructive Pulmonary Disease (COPD); Pneumothorax; Sleep Apnea; Tuberculosis Gastrointestinal Complaints and Symptoms: Negative for: Frequent diarrhea; Nausea; Vomiting Raymond Barnett, Raymond Barnett ([id number]) Medical History: Negative for: Cirrhosis ; Colitis; Crohnos; Hepatitis A; Hepatitis B; Hepatitis C Endocrine Complaints and Symptoms: Negative for:  Hepatitis; Thyroid disease; Polydypsia (Excessive Thirst) Medical History: Negative for: Type I Diabetes; Type II Diabetes Genitourinary Complaints and Symptoms: Negative for: Kidney failure/ Dialysis; Incontinence/dribbling Medical History: Negative for: End Stage Renal Disease Immunological Complaints and Symptoms: Negative for: Hives; Itching Medical History: Negative for: Lupus Erythematosus; Raynaudos; Scleroderma Integumentary (Skin) Complaints and Symptoms: Positive for: Wounds Negative for: Bleeding or bruising tendency; Breakdown Medical History: Negative for: History of Burn; History of pressure wounds Musculoskeletal Complaints and Symptoms: Negative for: Muscle Pain; Muscle Weakness Review of System Notes: right knee replacement Medical History: Positive for: Rheumatoid Arthritis Negative for: Gout; Osteoarthritis; Osteomyelitis Neurologic Complaints and Symptoms: Negative for: Numbness/parasthesias; Focal/Weakness Medical History: Negative for: Dementia; Neuropathy; Quadriplegia; Paraplegia; Seizure Disorder Psychiatric Complaints and Symptoms: Negative for: Anxiety; Claustrophobia Raymond Barnett, Raymond Barnett ([id number]) Medical History: Negative for: Anorexia/bulimia; Confinement Anxiety Cardiovascular Medical History: Positive for: Hypertension; Peripheral Venous Disease Negative for: Angina; Arrhythmia; Congestive Heart Failure; Coronary Artery Disease; Deep Vein Thrombosis; Hypotension; Myocardial Infarction; Peripheral Arterial Disease; Phlebitis; Vasculitis Oncologic Complaints and Symptoms: No Complaints or Symptoms HBO Extended History Items Eyes: Glaucoma Immunizations Pneumococcal Vaccine: Received Pneumococcal Vaccination: Yes Implantable Devices Family and Social History Cancer: Yes - Mother; Diabetes: No; Heart Disease: Yes - Father; Hypertension: Yes - Father; Kidney Disease: No; Lung Disease: No; Seizures: No; Stroke: No; Thyroid Problems: No;  Tuberculosis: No; Former smoker - quit 35 years ago; Marital Status - Married; Alcohol Use: Rarely; Drug Use: No History; Caffeine Use: Daily; Financial Concerns: No; Food, Clothing or Shelter Needs: No; Support System Lacking: No; Transportation Concerns: No; Advanced Directives: No; Patient does not want information on Advanced Directives; Do not resuscitate: No; Living Will: No; Medical Power of Attorney: No Electronic Signature(s) Signed:  12/24/2017 12:03:29 PM By: Roger Shelter Signed: 12/24/2017 4:51:46 PM By: Linton Ham MD Entered By: Roger Shelter on 12/24/2017 10:56:43 Raymond Barnett ([id number]) -------------------------------------------------------------------------------- [place] Details Patient Name: Raymond Barnett Date of Service: 12/24/2017 Medical Record Number: [id number] Patient Account Number: [id number] Date of Birth/Sex: [birth year] (84 y.o. 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 Facility Procedures CPT4 Code: 62947654 Description: 65035 - WOUND CARE VISIT-LEV 3 EST PT Modifier: Quantity: 1 CPT4 Code: 46568127 Description: 51700 - DEBRIDE WOUND 1ST 20 SQ CM OR < ICD-10 Diagnosis Description F74.944 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 Modifier: of skin Quantity: 1 Physician Procedures CPT4 Code: 9675916 Description: 38466 - WC PHYS LEVEL 4 - NEW PT ICD-10 Diagnosis Description Z99.357 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

## 2017-12-30 ENCOUNTER — Telehealth: Payer: Self-pay | Admitting: Internal Medicine

## 2017-12-30 NOTE — Telephone Encounter (Signed)
New Message: ° ° ° °Patient is calling for lab results  °

## 2017-12-30 NOTE — Telephone Encounter (Signed)
I will route call to primary nurse Lenice Llamas, RN.

## 2017-12-30 NOTE — Telephone Encounter (Signed)
Patient verbalized understanding of most recent lab results from 12/26/17. He is aware of appointment tomorrow.

## 2017-12-31 ENCOUNTER — Encounter: Payer: Medicare Other | Admitting: Internal Medicine

## 2017-12-31 ENCOUNTER — Ambulatory Visit (INDEPENDENT_AMBULATORY_CARE_PROVIDER_SITE_OTHER): Payer: Medicare Other | Admitting: Internal Medicine

## 2017-12-31 ENCOUNTER — Encounter: Payer: Self-pay | Admitting: Internal Medicine

## 2017-12-31 VITALS — BP 116/60 | HR 79 | Ht 70.0 in | Wt 212.0 lb

## 2017-12-31 DIAGNOSIS — L97312 Non-pressure chronic ulcer of right ankle with fat layer exposed: Secondary | ICD-10-CM | POA: Diagnosis not present

## 2017-12-31 DIAGNOSIS — I2510 Atherosclerotic heart disease of native coronary artery without angina pectoris: Secondary | ICD-10-CM

## 2017-12-31 DIAGNOSIS — I5032 Chronic diastolic (congestive) heart failure: Secondary | ICD-10-CM | POA: Diagnosis not present

## 2017-12-31 DIAGNOSIS — I10 Essential (primary) hypertension: Secondary | ICD-10-CM | POA: Diagnosis not present

## 2017-12-31 DIAGNOSIS — I739 Peripheral vascular disease, unspecified: Secondary | ICD-10-CM | POA: Diagnosis not present

## 2017-12-31 DIAGNOSIS — L97311 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 of skin: Secondary | ICD-10-CM | POA: Diagnosis not present

## 2017-12-31 DIAGNOSIS — I872 Venous insufficiency (chronic) (peripheral): Secondary | ICD-10-CM | POA: Diagnosis not present

## 2017-12-31 DIAGNOSIS — M069 Rheumatoid arthritis, unspecified: Secondary | ICD-10-CM | POA: Diagnosis not present

## 2017-12-31 DIAGNOSIS — I70233 Atherosclerosis of native arteries of right leg with ulceration of ankle: Secondary | ICD-10-CM | POA: Diagnosis not present

## 2017-12-31 DIAGNOSIS — Z96651 Presence of right artificial knee joint: Secondary | ICD-10-CM | POA: Diagnosis not present

## 2017-12-31 NOTE — Progress Notes (Signed)
Follow-up Outpatient Visit Date: 12/31/2017  Primary Care Provider: [name], DO [address] [place] [address]  Chief Complaint: Right ankle wound  HPI:  Raymond Barnett is a 84 y.o. year-old male with history of PAD with slow to heal right ankle wound, hypertension, chronic thrombocytopenia, BPH, arthritis, and remote osteomyelitis, who presents for follow-up of shortness of breath and PAD.  I last saw him in late May, which time he was doing well with improved shortness of breath while lying in bed and with activity.  Right ankle wound was gradually healing as well.  Preceding echo showed mildly reduced LVEF with possible inferior/posterior wall motion abnormality.  Vascular studies were also abnormal with moderate to severe runoff disease in both legs, worse on the right.  We discussed coronary ischemia testing as well as further evaluation of his PAD, which the patient declined.  Today, Mr. Meeker reports that his right lateral ankle wound remains, though it is very slowly improving per his report.  He was recently advised by his wound care specialist, Dr. Dellia Nims, to consider undergoing further vascular evaluation.  He has chronic neuropathic pain in his legs but no discomfort with ambulation to suggest claudication.  He does not have any wounds on his left leg.  Mr. Vetsch has stable exertional dyspnea.  He has not had any chest pain.  He also denies palpitations.  He notes occasional dependent leg edema as well as mild intermittent orthopnea.  --------------------------------------------------------------------------------------------------  Cardiovascular History & Procedures: Cardiovascular Problems:  Dyspnea on exertion with mildly reduced LVEF  Claudication with poorly healing right ankle wound  Risk Factors:  Hypertension, aortic atherosclerosis, male gender, and age > 39  Cath/PCI:  None  CV Surgery:  None  EP Procedures and  Devices:  None  Non-Invasive Evaluation(s):  Echocardiogram (09/10/2017): Normal LV size with moderate LVH.  LVEF mildly reduced at 45-50% with possible inferior/posterior hypokinesis.  Grade 2 diastolic dysfunction with elevated filling pressure.  Mild aortic and moderate mitral regurgitation.  Mild to moderate left atrial enlargement.  Mild to moderate pulmonary hypertension.  Bilateral lower extremity arterial ultrasound (09/10/2017): Right lower extremity with moderate distal popliteal and moderate to severe posterior runoff disease most pronounced in the posterior tibial artery.  Left lower extremity without significant stenosis.  ABI's were 0.6 on the right and 1.1 on the left; TBI's 0.33 on the right and 0.59 on the left.  Recent CV Pertinent Labs: Lab Results  Component Value Date   CHOL 95 12/26/2017   CHOL 154 09/10/2017   CHOL 152 07/04/2016   HDL 40 (L) 12/26/2017   HDL 44 09/10/2017   LDLCALC 42 12/26/2017   LDLCALC 91 09/10/2017   TRIG 64 12/26/2017   TRIG 156 (A) 07/04/2016   CHOLHDL 2.4 12/26/2017   K 4.3 02/12/2017   BUN 16 02/12/2017   BUN 12 02/16/2015   CREATININE 1.00 05/19/2017    Past medical and surgical history were reviewed and updated in EPIC.  Current Meds  Medication Sig  . aspirin 81 MG tablet Take 1 tablet by mouth daily.  . Calcium Carb-Cholecalciferol (CALCIUM 600 + D) 600-200 MG-UNIT TABS Take 1 tablet by mouth daily. Patient takes 1000 mg daily.  . Cholecalciferol (VITAMIN D3) 1000 units CAPS   . finasteride (PROSCAR) 5 MG tablet Take 5 mg by mouth daily.  Marland Kitchen ipratropium (ATROVENT) 0.06 % nasal spray Place 2 sprays into both nostrils 4 (four) times daily as needed for rhinitis.  . Lidocaine-Hydrocortisone Ace  2-2 % KIT Use rectal suppository twice daily as needed for up to 1 week for hemorrhoids  . lisinopril (PRINIVIL,ZESTRIL) 5 MG tablet Take 1 tablet (5 mg total) by mouth daily.  Marland Kitchen loratadine (CLARITIN) 10 MG tablet Take 1 tablet (10 mg  total) by mouth daily.  . timolol (TIMOPTIC) 0.5 % ophthalmic solution 1 drop 2 (two) times daily.  Marland Kitchen ULTRA FRESH 0.5 % SOLN     Allergies: Patient has no known allergies.  Social History   Tobacco Use  . Smoking status: Former Smoker    Packs/day: 1.00    Years: 30.00    Pack years: 30.00    Types: Cigarettes    Last attempt to quit: 1995    Years since quitting: 24.7  . Smokeless tobacco: Former Network engineer Use Topics  . Alcohol use: Not Currently    Alcohol/week: 1.0 standard drinks    Types: 1 Glasses of wine per week    Frequency: Never  . Drug use: No    Family History  Problem Relation Age of Onset  . Colon cancer Mother 6  . Thrombosis Father 42  . Heart disease Father        Heart valve problem  . AAA (abdominal aortic aneurysm) Father   . Hypertension Sister   . Hyperlipidemia Sister   . Hypertension Sister   . Hyperlipidemia Sister   . Hypertension Sister   . Hyperlipidemia Sister     Review of Systems: A 12-system review of systems was performed and was negative except as noted in the HPI.  --------------------------------------------------------------------------------------------------  Physical Exam: BP 116/60 (BP Location: Left Arm, Patient Position: Sitting, Cuff Size: Normal)   Pulse 79   Ht '5\' 10"'  (1.778 m)   Wt 212 lb (96.2 kg)   BMI 30.42 kg/m   General: NAD.  Accompanied by his wife. HEENT: No conjunctival pallor or scleral icterus. Moist mucous membranes.  OP clear. Neck: Supple without lymphadenopathy, thyromegaly, JVD, or HJR. Lungs: Normal work of breathing. Clear to auscultation bilaterally without wheezes or crackles. Heart: Regular rate and rhythm without murmurs, rubs, or gallops. Non-displaced PMI. Abd: Bowel sounds present. Soft, NT/ND without hepatosplenomegaly Ext: 1+ right ankle edema.  Clean dressing overlying right lateral ankle wound.  Pedal pulses are not palpable in either foot.  Femoral pulses are 2+  bilaterally. Skin: Warm and dry without rash.  EKG: Normal sinus rhythm with left axis deviation, right bundle branch block, and inferior Q waves.  Lab Results  Component Value Date   WBC 7.1 02/12/2017   HGB 13.4 02/12/2017   HCT 39.7 (L) 02/12/2017   MCV 97.6 02/12/2017   PLT 124 (L) 02/12/2017    Lab Results  Component Value Date   NA 136 02/12/2017   K 4.3 02/12/2017   CL 103 02/12/2017   CO2 24 02/12/2017   BUN 16 02/12/2017   CREATININE 1.00 05/19/2017   GLUCOSE 88 02/12/2017   ALT 20 12/26/2017    Lab Results  Component Value Date   CHOL 95 12/26/2017   HDL 40 (L) 12/26/2017   LDLCALC 42 12/26/2017   TRIG 64 12/26/2017   CHOLHDL 2.4 12/26/2017    --------------------------------------------------------------------------------------------------  ASSESSMENT AND PLAN: Peripheral vascular disease with right ankle wound I am suspicious that poorly healing right ankle wound represents critical limb ischemia, given significantly abnormal ABIs (worse on the right). I have stressed the importance for improving perfusion of the right lower extremity to facilitate wound healing and have recommended that  he undergo abdominal aortogram with bilateral lower extremity runoff and possible percutaneous intervention.  Mr. Kloss does not wish to pursue this at this time, citing his age and gradual healing of the wound.  I advised him that given the poor healing of the wound, he is at risk for developing osteomyelitis or other deep tissue infection, becoming septic, and losing his distal right lower extremity.  He is aware of this and again states that he does not wish to proceed.  We will continue with aspirin and statin therapy.  He is not a candidate for cilostazol given his reduced LVEF and heart failure symptoms.  Heart failure with preserved ejection fraction Patient reports NYHA class II-III symptoms.  He has mild chronic leg edema, which is likely multifactorial.  I recommended  further ischemia evaluation, in particular cardiac catheterization.  The patient declines.  We will continue his current medications.  Coronary artery disease I suspect that Mr. Gilman has significant underlying CAD, given echo with reduced LVEF and inferior wall motion abnormality as well as EKG demonstrating evidence of prior inferior infarct.  We will continue with secondary prevention including indefinite aspirin and statin therapy.  Hyperlipidemia LDL at goal.  Continue rosuvastatin.  Follow-up: Return to clinic in 3 months.Nelva Bush, MD 01/01/2018 8:43 PM

## 2017-12-31 NOTE — Patient Instructions (Signed)
Medication Instructions:  Your physician recommends that you continue on your current medications as directed. Please refer to the Current Medication list given to you today.   Labwork: none  Testing/Procedures: none  Follow-Up: Your physician recommends that you schedule a follow-up appointment in: 3 MONTHS WITH DR END.   If you need a refill on your cardiac medications before your next appointment, please call your pharmacy.   

## 2018-01-01 ENCOUNTER — Encounter: Payer: Self-pay | Admitting: Internal Medicine

## 2018-01-02 NOTE — Progress Notes (Signed)
NOOR, VIDALES ([id number]) Visit Report for 12/31/2017 Arrival Information Details Patient Name: Raymond Barnett, Raymond Barnett Date of Service: 12/31/2017 12:30 PM Medical Record Number: [id number] Patient Account Number: [id number] Date of Birth/Sex: June 25, 1929 (84 y.o. M) Treating RN: Secundino Ginger Primary Care Charlissa Petros: Nobie Putnam Other Clinician: Referring Deniss Wormley: Nobie Putnam Treating Scout Gumbs/Extender: Tito Dine in Treatment: 1 Visit Information History Since Last Visit Added or deleted any medications: No Patient Arrived: Cane Any new allergies or adverse reactions: No Arrival Time: 12:30 Had a fall or experienced change in No Accompanied By: spouse activities of daily living that may affect Transfer Assistance: None risk of falls: Patient Identification Verified: Yes Signs or symptoms of abuse/neglect since last visito No Secondary Verification Process Completed: Yes Hospitalized since last visit: No Implantable device outside of the clinic excluding No cellular tissue based products placed in the center since last visit: Has Dressing in Place as Prescribed: Yes Pain Present Now: No Electronic Signature(s) Signed: 12/31/2017 1:57:12 PM By: Secundino Ginger Entered By: Secundino Ginger on 12/31/2017 12:33:16 Raymond Barnett ([id number]) -------------------------------------------------------------------------------- Encounter Discharge Information Details Patient Name: Raymond Barnett Date of Service: 12/31/2017 12:30 PM Medical Record Number: [id number] Patient Account Number: [id number] Date of Birth/Sex: [birth year] (84 y.o. M) Treating RN: Montey Hora Primary Care Waldon Sheerin: Nobie Putnam Other Clinician: Referring Chrissie Dacquisto: Nobie Putnam Treating Zakya Halabi/Extender: Tito Dine in Treatment: 1 Encounter Discharge Information Items Discharge Condition: Stable Ambulatory Status: Cane Discharge Destination:  Home Transportation: Private Auto Accompanied By: wife Schedule Follow-up Appointment: Yes Clinical Summary of Care: Electronic Signature(s) Signed: 12/31/2017 5:20:22 PM By: Montey Hora Entered By: Montey Hora on 12/31/2017 12:56:26 Raymond Barnett ([id number]) -------------------------------------------------------------------------------- Lower Extremity Assessment Details Patient Name: Raymond Barnett Date of Service: 12/31/2017 12:30 PM Medical Record Number: [id number] Patient Account Number: [id number] Date of Birth/Sex: 1929/09/01 (84 y.o. M) Treating RN: Secundino Ginger Primary Care Nic Lampe: Nobie Putnam Other Clinician: Referring Raymond Azure: Nobie Putnam Treating Nobie Alleyne/Extender: Tito Dine in Treatment: 1 Edema Assessment Assessed: [Left: No] [Right: No] [Left: Edema] [Right: :] Calf Left: Right: Point of Measurement: 37 cm From Medial Instep cm 32 cm Ankle Left: Right: Point of Measurement: 14 cm From Medial Instep cm 24 cm Vascular Assessment Claudication: Claudication Assessment [Right:None] Pulses: Dorsalis Pedis Palpable: [Right:Yes] Posterior Tibial Extremity colors, hair growth, and conditions: Extremity Color: [Right:Hyperpigmented] Temperature of Extremity: [Right:Cold] Capillary Refill: [Right:< 3 seconds] Toe Nail Assessment Left: Right: Thick: No Discolored: No Deformed: No Improper Length and Hygiene: No Electronic Signature(s) Signed: 12/31/2017 1:57:12 PM By: Secundino Ginger Entered By: Secundino Ginger on 12/31/2017 12:39:21 Raymond Barnett ([id number]) -------------------------------------------------------------------------------- Multi Wound Chart Details Patient Name: Raymond Barnett Date of Service: 12/31/2017 12:30 PM Medical Record Number: [id number] Patient Account Number: [id number] Date of Birth/Sex: [birth year] (84 y.o. M) Treating RN: Montey Hora Primary Care Charlet Harr: Nobie Putnam Other  Clinician: Referring Payten Beaumier: Nobie Putnam Treating Laci Frenkel/Extender: Tito Dine in Treatment: 1 Vital Signs Height(in): 70 Pulse(bpm): 70 Weight(lbs): 211 Blood Pressure(mmHg): 127/70 Body Mass Index(BMI): 30 Temperature(F): 97.9 Respiratory Rate 16 (breaths/min): Photos: [N/A:N/A] Wound Location: Right, Lateral Lower Leg N/A N/A Wounding Event: Other Lesion N/A N/A Primary Etiology: Venous Leg Ulcer N/A N/A Comorbid History: Glaucoma, Hypertension, N/A N/A Peripheral Venous Disease, Rheumatoid Arthritis Date Acquired: 11/20/2017 N/A N/A Weeks of Treatment: 1 N/A N/A Wound Status: Open N/A N/A Measurements L x W x D 0.9x0.7x0.2 N/A N/A (cm) Area (cm) : 0.495 N/A N/A Volume (cm) : 0.099 N/A N/A % Reduction in Area:  21.20% N/A N/A % Reduction in Volume: 21.40% N/A N/A Classification: Full Thickness Without N/A N/A Exposed Support Structures Exudate Amount: Small N/A N/A Exudate Type: Serous N/A N/A Exudate Color: amber N/A N/A Wound Margin: Distinct, outline attached N/A N/A Granulation Amount: None Present (0%) N/A N/A Necrotic Amount: Medium (34-66%) N/A N/A Exposed Structures: Fat Layer (Subcutaneous N/A N/A Tissue) Exposed: Yes Fascia: No Tendon: No Muscle: No Raymond Barnett, Raymond Barnett ([id number]) Joint: No Bone: No Epithelialization: None N/A N/A Debridement: Debridement - Excisional N/A N/A Pre-procedure 12:50 N/A N/A Verification/Time Out Taken: Pain Control: Lidocaine 4% Topical Solution N/A N/A Tissue Debrided: Subcutaneous, Slough N/A N/A Level: Skin/Subcutaneous Tissue N/A N/A Debridement Area (sq cm): 0.63 N/A N/A Instrument: Curette N/A N/A Bleeding: Minimum N/A N/A Hemostasis Achieved: Pressure N/A N/A Procedural Pain: 0 N/A N/A Post Procedural Pain: 0 N/A N/A Debridement Treatment Procedure was tolerated well N/A N/A Response: Post Debridement 0.9x0.7x0.3 N/A N/A Measurements L x W x D (cm) Post Debridement Volume:  0.148 N/A N/A (cm) Periwound Skin Texture: Excoriation: No N/A N/A Induration: No Callus: No Crepitus: No Rash: No Scarring: No Periwound Skin Moisture: Maceration: No N/A N/A Dry/Scaly: No Periwound Skin Color: Atrophie Blanche: No N/A N/A Cyanosis: No Ecchymosis: No Erythema: No Hemosiderin Staining: No Mottled: No Pallor: No Rubor: No Temperature: No Abnormality N/A N/A Tenderness on Palpation: No N/A N/A Wound Preparation: Ulcer Cleansing: N/A N/A Rinsed/Irrigated with Saline Topical Anesthetic Applied: Other: lidocaine 4% Procedures Performed: Debridement N/A N/A Treatment Notes Wound #1 (Right, Lateral Lower Leg) 1. Cleansed with: Clean wound with Normal Saline 2. Anesthetic Topical Lidocaine 4% cream to wound bed prior to debridement 3. Peri-wound Care: Skin Prep 4. Dressing Applied: Raymond Barnett, Raymond Barnett ([id number]) Other dressing (specify in notes) 5. Secondary Dressing Applied Bordered Foam Dressing Notes endoform Electronic Signature(s) Signed: 12/31/2017 5:30:50 PM By: Linton Ham MD Entered By: Linton Ham on 12/31/2017 13:06:33 Raymond Barnett ([id number]) -------------------------------------------------------------------------------- Multi-Disciplinary Care Plan Details Patient Name: Raymond Barnett Date of Service: 12/31/2017 12:30 PM Medical Record Number: [id number] Patient Account Number: [id number] Date of Birth/Sex: [birth year] (84 y.o. M) Treating RN: Montey Hora Primary Care Janalee Grobe: Nobie Putnam Other Clinician: Referring Brookelin Felber: Nobie Putnam Treating Handsome Anglin/Extender: Tito Dine in Treatment: 1 Active Inactive ` Orientation to the Wound Care Program Nursing Diagnoses: Knowledge deficit related to the wound healing center program Goals: Patient/caregiver will verbalize understanding of the [facility] Date Initiated: 12/24/2017 Target Resolution Date: 01/24/2018 Goal  Status: Active Interventions: Provide education on orientation to the wound center Notes: ` Wound/Skin Impairment Nursing Diagnoses: Impaired tissue integrity Knowledge deficit related to ulceration/compromised skin integrity Goals: Ulcer/skin breakdown will have a volume reduction of 30% by week 4 Date Initiated: 12/24/2017 Target Resolution Date: 01/24/2018 Goal Status: Active Interventions: Provide education on ulcer and skin care Treatment Activities: Topical wound management initiated : 12/24/2017 Notes: Electronic Signature(s) Signed: 12/31/2017 5:20:22 PM By: Montey Hora Entered By: Montey Hora on 12/31/2017 12:48:51 Raymond Barnett ([id number]) -------------------------------------------------------------------------------- Pain Assessment Details Patient Name: Raymond Barnett Date of Service: 12/31/2017 12:30 PM Medical Record Number: [id number] Patient Account Number: [id number] Date of Birth/Sex: Oct 10, 1929 (84 y.o. M) Treating RN: Secundino Ginger Primary Care Lavel Rieman: Nobie Putnam Other Clinician: Referring Teresea Donley: Nobie Putnam Treating Tabari Volkert/Extender: Tito Dine in Treatment: 1 Active Problems Location of Pain Severity and Description of Pain Patient Has Paino No Site Locations Pain Management and Medication Current Pain Management: Goals for Pain Management pt denies any pain at this time. Electronic  Signature(s) Signed: 12/31/2017 1:57:12 PM By: Secundino Ginger Entered By: Secundino Ginger on 12/31/2017 12:35:10 Raymond Barnett ([id number]) -------------------------------------------------------------------------------- Patient/Caregiver Education Details Patient Name: Raymond Barnett Date of Service: 12/31/2017 12:30 PM Medical Record Number: [id number] Patient Account Number: [id number] Date of Birth/Gender: 10-09-1929 (84 y.o. M) Treating RN: Montey Hora Primary Care Physician: Nobie Putnam Other Clinician: Referring  Physician: Nobie Putnam Treating Physician/Extender: Tito Dine in Treatment: 1 Education Assessment Education Provided To: Patient and Caregiver Education Topics Provided Wound/Skin Impairment: Handouts: Other: wound care as ordered Methods: Demonstration, Explain/Verbal Responses: State content correctly Electronic Signature(s) Signed: 12/31/2017 5:20:22 PM By: Montey Hora Entered By: Montey Hora on 12/31/2017 12:56:51 Raymond Barnett ([id number]) -------------------------------------------------------------------------------- Wound Assessment Details Patient Name: Raymond Barnett Date of Service: 12/31/2017 12:30 PM Medical Record Number: [id number] Patient Account Number: [id number] Date of Birth/Sex: [birth year] (84 y.o. M) Treating RN: Secundino Ginger Primary Care Deaunte Dente: Nobie Putnam Other Clinician: Referring Avner Stroder: Nobie Putnam Treating Arabella Revelle/Extender: Tito Dine in Treatment: 1 Wound Status Wound Number: 1 Primary Venous Leg Ulcer Etiology: Wound Location: Right, Lateral Lower Leg Wound Open Wounding Event: Other Lesion Status: Date Acquired: 11/20/2017 Comorbid Glaucoma, Hypertension, Peripheral Venous Weeks Of Treatment: 1 History: Disease, Rheumatoid Arthritis Clustered Wound: No Photos Photo Uploaded By: Secundino Ginger on 12/31/2017 12:41:18 Wound Measurements Length: (cm) 0.9 Width: (cm) 0.7 Depth: (cm) 0.2 Area: (cm) 0.495 Volume: (cm) 0.099 % Reduction in Area: 21.2% % Reduction in Volume: 21.4% Epithelialization: None Tunneling: No Undermining: No Wound Description Full Thickness Without Exposed Support Foul Odo Classification: Structures Slough/F Wound Margin: Distinct, outline attached Exudate Small Amount: Exudate Type: Serous Exudate Color: amber r After Cleansing: No ibrino Yes Wound Bed Granulation Amount: None Present (0%) Exposed Structure Necrotic Amount: Medium  (34-66%) Fascia Exposed: No Necrotic Quality: Adherent Slough Fat Layer (Subcutaneous Tissue) Exposed: Yes Tendon Exposed: No Muscle Exposed: No Joint Exposed: No Bone Exposed: No Raymond Barnett, Raymond Barnett. ([id number]) Periwound Skin Texture Texture Color No Abnormalities Noted: No No Abnormalities Noted: No Callus: No Atrophie Blanche: No Crepitus: No Cyanosis: No Excoriation: No Ecchymosis: No Induration: No Erythema: No Rash: No Hemosiderin Staining: No Scarring: No Mottled: No Pallor: No Moisture Rubor: No No Abnormalities Noted: No Dry / Scaly: No Temperature / Pain Maceration: No Temperature: No Abnormality Wound Preparation Ulcer Cleansing: Rinsed/Irrigated with Saline Topical Anesthetic Applied: Other: lidocaine 4%, Treatment Notes Wound #1 (Right, Lateral Lower Leg) 1. Cleansed with: Clean wound with Normal Saline 2. Anesthetic Topical Lidocaine 4% cream to wound bed prior to debridement 3. Peri-wound Care: Skin Prep 4. Dressing Applied: Other dressing (specify in notes) 5. Secondary Dressing Applied Bordered Foam Dressing Notes endoform Electronic Signature(s) Signed: 12/31/2017 1:57:12 PM By: Secundino Ginger Entered By: Secundino Ginger on 12/31/2017 12:41:57 Raymond Barnett ([id number]) -------------------------------------------------------------------------------- [place] Details Patient Name: Raymond Barnett Date of Service: 12/31/2017 12:30 PM Medical Record Number: [id number] Patient Account Number: [id number] Date of Birth/Sex: March 11, 1930 (84 y.o. M) Treating RN: Secundino Ginger Primary Care Nishita Isaacks: Nobie Putnam Other Clinician: Referring Asher Torpey: Nobie Putnam Treating Mannie Wineland/Extender: Tito Dine in Treatment: 1 Vital Signs Time Taken: 12:35 Temperature (F): 97.9 Height (in): 70 Pulse (bpm): 70 Weight (lbs): 211 Respiratory Rate (breaths/min): 16 Body Mass Index (BMI): 30.3 Blood Pressure (mmHg): 127/70 Reference  Range: 80 - 120 mg / dl Electronic Signature(s) Signed: 12/31/2017 1:57:12 PM By: Secundino Ginger Entered BySecundino Ginger on 12/31/2017 12:35:47

## 2018-01-02 NOTE — Progress Notes (Signed)
KENNER, LEWAN ([id number]) Visit Report for 12/31/2017 Debridement Details Patient Name: Raymond Barnett, Raymond Barnett Date of Service: 12/31/2017 12:30 PM Medical Record Number: [id number] Patient Account Number: [id number] Date of Birth/Sex: 1929-07-15 (84 y.o. M) Treating RN: Montey Hora Primary Care Provider: Nobie Putnam Other Clinician: Referring Provider: Nobie Putnam Treating Provider/Extender: Tito Dine in Treatment: 1 Debridement Performed for Wound #1 Right,Lateral Lower Leg Assessment: Performed By: Physician Ricard Dillon, MD Debridement Type: Debridement Severity of Tissue Pre Fat layer exposed Debridement: Pre-procedure Verification/Time Yes - 12:50 Out Taken: Start Time: 12:50 Pain Control: Lidocaine 4% Topical Solution Total Area Debrided (L x W): 0.9 (cm) x 0.7 (cm) = 0.63 (cm) Tissue and other material Viable, Non-Viable, Slough, Subcutaneous, Slough debrided: Level: Skin/Subcutaneous Tissue Debridement Description: Excisional Instrument: Curette Bleeding: Minimum Hemostasis Achieved: Pressure End Time: 12:52 Procedural Pain: 0 Post Procedural Pain: 0 Response to Treatment: Procedure was tolerated well Level of Consciousness: Awake and Alert Post Debridement Measurements of Total Wound Length: (cm) 0.9 Width: (cm) 0.7 Depth: (cm) 0.3 Volume: (cm) 0.148 Character of Wound/Ulcer Post Debridement: Improved Severity of Tissue Post Debridement: Fat layer exposed Post Procedure Diagnosis Same as Pre-procedure Electronic Signature(s) Signed: 12/31/2017 5:20:22 PM By: Montey Hora Signed: 12/31/2017 5:30:50 PM By: Linton Ham MD Entered By: Linton Ham on 12/31/2017 13:06:43 Raymond Barnett ([id number]) -------------------------------------------------------------------------------- HPI Details Patient Name: Raymond Barnett Date of Service: 12/31/2017 12:30 PM Medical Record Number: [id number] Patient Account  Number: [id number] Date of Birth/Sex: 1929-05-15 (84 y.o. M) Treating RN: Montey Hora Primary Care Provider: Nobie Putnam Other Clinician: Referring Provider: Nobie Putnam Treating Provider/Extender: Tito Dine in Treatment: 1 History of Present Illness Severity: furthermore the TBI was 0.33 on the right HPI Description: ADMISSION 12/24/17 This is an 84 year old man who arrives for review of wound on his right lateral ankle area. He states this happened about a year ago when he picked the scab off the area and the wound opened and never really healed. He has been followed by Dr. Governor Specking at [facility] wound care center. He is apparently used a multitude of topical dressings as well as skin substitutes however I'm not certain at this point with scattered skin substitutes were used. His last visit there was on 10/09/17 he's been using sorbact with hydrogel and a covering dressing. He states the wound is actually gotten better with less depth and wound volume. He is frustrated by the slowness of the healing and he is coming here for review. He also lives in [place] and this is a lot closer for him in terms of traveling. I have not had a chance to look through all of Dr. Samule Ohm notes however the wound was listed as a venous ulcer at one point. He had an x-ray done in December 2018 that did not show evidence of osteomyelitis. According to the patient osteomyelitis was ruled out. The patient actually has a complex history in this area. He suffered a serious fracture in the area perhaps 60 years ago while he was in the [employer] when [employer] over his ankle. He had a multitude of surgeries, bone graft and ultimately an ankle fusion. Apparently all the hardware however has been taken out of the ankle quite a while ago. He does not however have a prior wound history. The patient also saw his cardiologist in May who appropriately ordered vascular tests.  This showed an ABI in the right of 0.6 on the left of 1.07. This showed monophasic waveforms and  all of the tibial vessels. 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 Also at 30-49% stenosis of the distal popliteal. furthermore TBI was 0.33 on the right which is markedly reduced. Looking at Dr. Marisue Humble Notes the patient refused to see an interventionalist 12/31/17; right lateral ankle wound.. He sees Dr. and his cardiologist this afternoon and they will discuss further peripheral artery evaluation. He is using Endoform. Electronic Signature(s) Signed: 12/31/2017 5:30:50 PM By: Linton Ham MD Entered By: Linton Ham on 12/31/2017 13:07:28 Raymond Barnett ([id number]) -------------------------------------------------------------------------------- Physical Exam Details Patient Name: Raymond Barnett Date of Service: 12/31/2017 12:30 PM Medical Record Number: [id number] Patient Account Number: [id number] Date of Birth/Sex: April 13, 1930 (84 y.o. M) Treating RN: Montey Hora Primary Care Provider: Nobie Putnam Other Clinician: Referring Provider: Nobie Putnam Treating Provider/Extender: Tito Dine in Treatment: 1 Cardiovascular I do not feel a popliteal pulse on the right either. Pedal pulses absent bilaterally.. Notes wound exam; patient's wound looks about the same as last week. Pale wound with some undermining inferiorly. necrotic surface debris removed from the surface of the wound. Hemostasis with direct pressure. Electronic Signature(s) Signed: 12/31/2017 5:30:50 PM By: Linton Ham MD Entered By: Linton Ham on 12/31/2017 13:14:33 Raymond Barnett ([id number]) -------------------------------------------------------------------------------- Physician Orders Details Patient Name: Raymond Barnett Date of Service: 12/31/2017 12:30 PM Medical Record Number: [id number] Patient Account Number: [id number] Date of Birth/Sex: May 20, 1929 (84 y.o.  M) Treating RN: Montey Hora Primary Care Provider: Nobie Putnam Other Clinician: Referring Provider: Nobie Putnam Treating Provider/Extender: Tito Dine in Treatment: 1 Verbal / Phone Orders: No Diagnosis Coding Wound Cleansing Wound #1 Right,Lateral Lower Leg o Clean wound with Normal Saline. Anesthetic (add to Medication List) Wound #1 Right,Lateral Lower Leg o Topical Lidocaine 4% cream applied to wound bed prior to debridement (In Clinic Only). Primary Wound Dressing Wound #1 Right,Lateral Lower Leg o Other: - Endoform (also pack into tunnel at 10:00) Secondary Dressing Wound #1 Right,Lateral Lower Leg o Boardered Foam Dressing Dressing Change Frequency Wound #1 Right,Lateral Lower Leg o Change Dressing Monday, Wednesday, Friday Follow-up Appointments Wound #1 Right,Lateral Lower Leg o Return Appointment in 1 week. Additional Orders / Instructions Wound #1 Right,Lateral Lower Leg o Increase protein intake. Electronic Signature(s) Signed: 12/31/2017 5:20:22 PM By: Montey Hora Signed: 12/31/2017 5:30:50 PM By: Linton Ham MD Entered By: Montey Hora on 12/31/2017 12:55:47 Raymond Barnett ([id number]) -------------------------------------------------------------------------------- Problem List Details Patient Name: Raymond Barnett Date of Service: 12/31/2017 12:30 PM Medical Record Number: [id number] Patient Account Number: [id number] Date of Birth/Sex: [birth year] (84 y.o. 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 M) Treating RN: Montey Hora Primary Care Provider: Nobie Putnam Other Clinician: Referring Provider: Nobie Putnam Treating Provider/Extender: Tito Dine in Treatment: 1 Subjective History of Present Illness (HPI) The following HPI elements were documented for the patient's wound:  Severity: furthermore the TBI was 0.33 on the right ADMISSION 12/24/17 This is an 84 year old man who arrives for review of wound on his right lateral ankle area. He states this happened about a year ago when he picked the scab off the area and the wound opened and never really healed. He has been followed by Dr. Governor Specking at [facility] wound care center. He is apparently used a multitude of topical dressings as well as skin substitutes however I'm not certain at this point with scattered skin substitutes were used. His last visit there was on 10/09/17 he's been using sorbact with hydrogel and a covering dressing. He states the wound is actually gotten better with less depth and wound volume. He is frustrated by the slowness of the healing and he is coming here for review. He also lives in [place] and this is a lot closer for him in terms of traveling. I have not had a chance to look through all of Dr. Samule Ohm notes however the wound was listed as a venous ulcer at one point. He had an x-ray done in December 2018 that did not show evidence of osteomyelitis.  According to the patient osteomyelitis was ruled out. The patient actually has a complex history in this area. He suffered a serious fracture in the area perhaps 60 years ago while he was in the [employer] when [employer] over his ankle. He had a multitude of surgeries, bone graft and ultimately an ankle fusion. Apparently all the hardware however has been taken out of the ankle quite a while ago. He does not however have a prior wound history. The patient also saw his cardiologist in May who appropriately ordered vascular tests. This showed an ABI in the right of 0.6 on the left of 1.07. This showed monophasic waveforms and all of the tibial vessels. 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 Also at 30-49% stenosis of the distal popliteal. furthermore TBI was 0.33 on the right which is markedly reduced. Looking at Dr. Marisue Humble Notes the patient refused to see an interventionalist 12/31/17; right lateral ankle wound.. He sees Dr. and his cardiologist this afternoon and they will discuss further peripheral artery evaluation. He is using Endoform. Objective Constitutional Vitals Time Taken: 12:35 PM, Height: 70 in, Weight: 211 lbs, BMI: 30.3, Temperature: 97.9 F, Pulse: 70 bpm, Respiratory Rate: 16 breaths/min, Blood Pressure: 127/70 mmHg. Cardiovascular I do not feel a popliteal pulse on the right either. Pedal pulses absent bilaterally.Marland Kitchen Raymond Barnett, Raymond Barnett ([id number]) General Notes: wound exam; patient's wound looks about the same as last week. Pale wound with some undermining inferiorly. necrotic surface debris removed from the surface of the wound. Hemostasis with direct pressure. Integumentary (Hair, Skin) Wound #1 status is Open. Original cause of wound was Other Lesion. The wound is located on the Right,Lateral Lower Leg. The wound measures 0.9cm length x 0.7cm width x 0.2cm depth; 0.495cm^2 area and 0.099cm^3 volume. There is Fat Layer (Subcutaneous Tissue) Exposed exposed. There is no  tunneling or undermining noted. There is a small amount of serous drainage noted. The wound margin is distinct with the outline attached to the wound base. There is no granulation within the wound bed. There is a medium (34-66%) amount of necrotic tissue within the wound bed including Adherent Slough. The periwound skin appearance did not exhibit: Callus, Crepitus, Excoriation, Induration, Rash, Scarring, Dry/Scaly, Maceration, Atrophie Blanche, Cyanosis, Ecchymosis, Hemosiderin Staining, Mottled, Pallor, Rubor, Erythema. Periwound temperature was noted as No Abnormality. 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 There was a Excisional Skin/Subcutaneous Tissue Debridement with a total area of 0.63 sq cm performed by Ricard Dillon, MD. With the following instrument(s): Curette to remove Viable and Non-Viable tissue/material. Material removed includes Subcutaneous Tissue and Slough and after achieving pain control using Lidocaine 4% Topical Solution. No specimens were taken. A time out was conducted at 12:50, prior to the start of the procedure. A Minimum amount of bleeding was controlled with Pressure. The procedure was tolerated well with a pain level of 0 throughout and a pain level of 0 following the procedure. Patient s Level of Consciousness post procedure was recorded as Awake and Alert. Post Debridement Measurements: 0.9cm length x 0.7cm width x 0.3cm depth; 0.148cm^3 volume. Character of Wound/Ulcer Post Debridement is improved. Severity of Tissue Post Debridement is: Fat layer exposed. Post procedure Diagnosis Wound #1: Same as Pre-Procedure Plan Wound Cleansing: Wound #1  Right,Lateral Lower Leg: Clean wound with Normal Saline. Anesthetic (add to Medication List): Wound #1 Right,Lateral Lower Leg: Topical Lidocaine 4% cream applied to wound bed prior to debridement (In Clinic Only). Raymond Barnett, Raymond Barnett ([id number]) Primary Wound Dressing: Wound #1 Right,Lateral Lower Leg: Other: - Endoform (also pack into tunnel at 10:00) Secondary Dressing: Wound #1 Right,Lateral Lower Leg: Boardered Foam Dressing Dressing Change Frequency: Wound #1 Right,Lateral Lower Leg: Change Dressing Monday, Wednesday, Friday Follow-up Appointments: Wound #1 Right,Lateral Lower Leg: Return Appointment in 1 week. Additional Orders / Instructions: Wound #1 Right,Lateral Lower Leg: Increase protein intake. #1 Endoform. It turns out his wife is not moistening the Endoform.border foam is a secondary dressing #2 he sees [name] this afternoon. I'm hopeful that they will discuss the revascularization/arteriogram issue here as well. #3 once I more clear about the arterial flow all try to research the wound care visits at [facility] to determine with skin substitutes were tried. I think one of them based on discussion with the patient was [facility]. There may have been ACEL as well Electronic Signature(s) Signed: 12/31/2017 5:30:50 PM By: Linton Ham MD Entered By: Linton Ham on 12/31/2017 13:16:46 Raymond Barnett ([id number]) -------------------------------------------------------------------------------- [place] Details Patient Name: Raymond Barnett Date of Service: 12/31/2017 Medical Record Number: [id number] Patient Account Number: [id number] Date of Birth/Sex: [birth year] (84 y.o. 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 Facility Procedures CPT4 Code: [id number] Description: 11042 - DEB SUBQ TISSUE 20 SQ CM/< ICD-10 Diagnosis Description L97.311 Non-pressure chronic ulcer of right ankle limited to breakdow I70.233 Atherosclerosis of native arteries of right leg with ulcerati Modifier: n of skin on of ankle Quantity: 1 Physician Procedures CPT4 Code: [id number] Description: 11042 - WC PHYS SUBQ TISS 20 SQ CM ICD-10 Diagnosis Description W25.852 Non-pressure chronic ulcer of right ankle limited to breakdow I70.233 Atherosclerosis of native arteries of right leg with ulcerati Modifier: n of skin on of ankle Quantity: 1 Electronic Signature(s) Signed: 12/31/2017 5:30:50 PM By: Linton Ham MD Entered By: Linton Ham on 12/31/2017 13:17:07

## 2018-01-07 ENCOUNTER — Encounter: Payer: Medicare Other | Admitting: Internal Medicine

## 2018-01-07 DIAGNOSIS — M069 Rheumatoid arthritis, unspecified: Secondary | ICD-10-CM | POA: Diagnosis not present

## 2018-01-07 DIAGNOSIS — I10 Essential (primary) hypertension: Secondary | ICD-10-CM | POA: Diagnosis not present

## 2018-01-07 DIAGNOSIS — I872 Venous insufficiency (chronic) (peripheral): Secondary | ICD-10-CM | POA: Diagnosis not present

## 2018-01-07 DIAGNOSIS — I70233 Atherosclerosis of native arteries of right leg with ulceration of ankle: Secondary | ICD-10-CM | POA: Diagnosis not present

## 2018-01-07 DIAGNOSIS — L97311 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 of skin: Secondary | ICD-10-CM | POA: Diagnosis not present

## 2018-01-07 DIAGNOSIS — Z96651 Presence of right artificial knee joint: Secondary | ICD-10-CM | POA: Diagnosis not present

## 2018-01-07 DIAGNOSIS — S91001A Unspecified open wound, right ankle, initial encounter: Secondary | ICD-10-CM | POA: Diagnosis not present

## 2018-01-10 NOTE — Progress Notes (Signed)
NOBUO, NUNZIATA ([id number]) Visit Report for 01/07/2018 Arrival Information Details Patient Name: Raymond Barnett, Raymond Barnett Date of Service: 01/07/2018 11:00 AM Medical Record Number: [id number] Patient Account Number: [id number] Date of Birth/Sex: 1930/03/02 (84 y.o. M) Treating RN: Cornell Barman Primary Care Christyan Reger: Nobie Putnam Other Clinician: Referring Levan Aloia: Nobie Putnam Treating Kiylee Thoreson/Extender: Tito Dine in Treatment: 2 Visit Information History Since Last Visit Added or deleted any medications: No Patient Arrived: Cane Any new allergies or adverse reactions: No Arrival Time: 10:50 Had a fall or experienced change in No Accompanied By: wife activities of daily living that may affect Transfer Assistance: None risk of falls: Patient Identification Verified: Yes Signs or symptoms of abuse/neglect since last visito No Secondary Verification Process Completed: Yes Hospitalized since last visit: No Implantable device outside of the clinic excluding No cellular tissue based products placed in the center since last visit: Has Dressing in Place as Prescribed: Yes Pain Present Now: Yes Electronic Signature(s) Signed: 01/07/2018 3:44:28 PM By: Lorine Bears RCP, RRT, CHT Entered By: Lorine Bears on 01/07/2018 10:50:55 Raymond Barnett ([id number]) -------------------------------------------------------------------------------- Clinic Level of Care Assessment Details Patient Name: Raymond Barnett Date of Service: 01/07/2018 11:00 AM Medical Record Number: [id number] Patient Account Number: [id number] Date of Birth/Sex: 1930-01-16 (84 y.o. M) Treating RN: Cornell Barman Primary Care Sharona Rovner: Nobie Putnam Other Clinician: Referring Bevelyn Arriola: Nobie Putnam Treating Lionell Matuszak/Extender: Tito Dine in Treatment: 2 Clinic Level of Care Assessment Items TOOL 4 Quantity Score []  - Use when only an  EandM is performed on FOLLOW-UP visit 0 ASSESSMENTS - Nursing Assessment / Reassessment []  - Reassessment of Co-morbidities (includes updates in patient status) 0 X- 1 5 Reassessment of Adherence to Treatment Plan ASSESSMENTS - Wound and Skin Assessment / Reassessment X - Simple Wound Assessment / Reassessment - one wound 1 5 []  - 0 Complex Wound Assessment / Reassessment - multiple wounds []  - 0 Dermatologic / Skin Assessment (not related to wound area) ASSESSMENTS - Focused Assessment []  - Circumferential Edema Measurements - multi extremities 0 []  - 0 Nutritional Assessment / Counseling / Intervention []  - 0 Lower Extremity Assessment (monofilament, tuning fork, pulses) []  - 0 Peripheral Arterial Disease Assessment (using hand held doppler) ASSESSMENTS - Ostomy and/or Continence Assessment and Care []  - Incontinence Assessment and Management 0 []  - 0 Ostomy Care Assessment and Management (repouching, etc.) PROCESS - Coordination of Care X - Simple Patient / Family Education for ongoing care 1 15 []  - 0 Complex (extensive) Patient / Family Education for ongoing care []  - 0 Staff obtains Programmer, systems, Records, Test Results / Process Orders []  - 0 Staff telephones HHA, Nursing Homes / Clarify orders / etc []  - 0 Routine Transfer to another Facility (non-emergent condition) []  - 0 Routine Hospital Admission (non-emergent condition) []  - 0 New Admissions / Biomedical engineer / Ordering NPWT, Apligraf, etc. []  - 0 Emergency Hospital Admission (emergent condition) X- 1 10 Simple Discharge Coordination Raymond, Barnett ([id number]) []  - 0 Complex (extensive) Discharge Coordination PROCESS - Special Needs []  - Pediatric / Minor Patient Management 0 []  - 0 Isolation Patient Management []  - 0 Hearing / Language / Visual special needs []  - 0 Assessment of Community assistance (transportation, D/C planning, etc.) []  - 0 Additional assistance / Altered mentation []  -  0 Support Surface(s) Assessment (bed, cushion, seat, etc.) INTERVENTIONS - Wound Cleansing / Measurement X - Simple Wound Cleansing - one wound 1 5 []  - 0 Complex Wound Cleansing - multiple wounds X-  1 5 Wound Imaging (photographs - any number of wounds) []  - 0 Wound Tracing (instead of photographs) X- 1 5 Simple Wound Measurement - one wound []  - 0 Complex Wound Measurement - multiple wounds INTERVENTIONS - Wound Dressings []  - Small Wound Dressing one or multiple wounds 0 X- 1 15 Medium Wound Dressing one or multiple wounds []  - 0 Large Wound Dressing one or multiple wounds []  - 0 Application of Medications - topical []  - 0 Application of Medications - injection INTERVENTIONS - Miscellaneous []  - External ear exam 0 []  - 0 Specimen Collection (cultures, biopsies, blood, body fluids, etc.) []  - 0 Specimen(s) / Culture(s) sent or taken to Lab for analysis []  - 0 Patient Transfer (multiple staff / Civil Service fast streamer / Similar devices) []  - 0 Simple Staple / Suture removal (25 or less) []  - 0 Complex Staple / Suture removal (26 or more) []  - 0 Hypo / Hyperglycemic Management (close monitor of Blood Glucose) []  - 0 Ankle / Brachial Index (ABI) - do not check if billed separately X- 1 5 Vital Signs MICCO, BOURBEAU ([id number]) Has the patient been seen at the hospital within the last three years: Yes Total Score: 70 Level Of Care: New/Established - Level 2 Electronic Signature(s) Signed: 01/08/2018 5:07:26 PM By: Gretta Cool, BSN, RN, CWS, Kim RN, BSN Entered By: Gretta Cool, BSN, RN, CWS, Kim on 01/07/2018 11:25:26 Raymond Barnett ([id number]) -------------------------------------------------------------------------------- Encounter Discharge Information Details Patient Name: Raymond Barnett Date of Service: 01/07/2018 11:00 AM Medical Record Number: [id number] Patient Account Number: [id number] Date of Birth/Sex: [birth year] (84 y.o. M) Treating RN: Cornell Barman Primary Care Toretto Tingler:  Nobie Putnam Other Clinician: Referring Cecillia Menees: Nobie Putnam Treating Jamere Stidham/Extender: Tito Dine in Treatment: 2 Encounter Discharge Information Items Discharge Condition: Stable Ambulatory Status: Ambulatory Discharge Destination: Home Transportation: Private Auto Accompanied By: wife Schedule Follow-up Appointment: Yes Clinical Summary of Care: Electronic Signature(s) Signed: 01/08/2018 5:07:26 PM By: Gretta Cool, BSN, RN, CWS, Kim RN, BSN Entered By: Gretta Cool, BSN, RN, CWS, Kim on 01/07/2018 11:26:16 Raymond Barnett ([id number]) -------------------------------------------------------------------------------- Lower Extremity Assessment Details Patient Name: Raymond Barnett Date of Service: 01/07/2018 11:00 AM Medical Record Number: [id number] Patient Account Number: [id number] Date of Birth/Sex: 1930-04-02 (84 y.o. M) Treating RN: Secundino Ginger Primary Care Adisynn Suleiman: Nobie Putnam Other Clinician: Referring Jameka Ivie: Nobie Putnam Treating Efe Fazzino/Extender: Tito Dine in Treatment: 2 Edema Assessment Assessed: [Left: No] [Right: No] [Left: Edema] [Right: :] Calf Left: Right: Point of Measurement: 37 cm From Medial Instep cm 32 cm Ankle Left: Right: Point of Measurement: 14 cm From Medial Instep cm 23.5 cm Vascular Assessment Claudication: Claudication Assessment [Left:None] [Right:None] Pulses: Dorsalis Pedis Palpable: [Right:Yes] Posterior Tibial Extremity colors, hair growth, and conditions: Extremity Color: [Right:Hyperpigmented] Temperature of Extremity: [Right:Warm] Capillary Refill: [Right:< 3 seconds] Toe Nail Assessment Left: Right: Thick: Yes Discolored: Yes Deformed: Yes Improper Length and Hygiene: No Electronic Signature(s) Signed: 01/07/2018 1:04:55 PM By: Secundino Ginger Entered By: Secundino Ginger on 01/07/2018 11:11:21 Raymond Barnett  ([id number]) -------------------------------------------------------------------------------- Multi Wound Chart Details Patient Name: Raymond Barnett Date of Service: 01/07/2018 11:00 AM Medical Record Number: [id number] Patient Account Number: [id number] Date of Birth/Sex: 1929/09/11 (84 y.o. M) Treating RN: Cornell Barman Primary Care Kase Shughart: Nobie Putnam Other Clinician: Referring Jamarious Febo: Nobie Putnam Treating Dametrius Sanjuan/Extender: Tito Dine in Treatment: 2 Vital Signs Height(in): 70 Pulse(bpm): 69 Weight(lbs): 211 Blood Pressure(mmHg): 117/46 Body Mass Index(BMI): 30 Temperature(F): 97.8 Respiratory Rate 16 (breaths/min): Photos: [N/A:N/A] Wound Location: Right Lower Leg -  Lateral N/A N/A Wounding Event: Other Lesion N/A N/A Primary Etiology: Venous Leg Ulcer N/A N/A Comorbid History: Glaucoma, Hypertension, N/A N/A Peripheral Venous Disease, Rheumatoid Arthritis Date Acquired: 11/20/2017 N/A N/A Weeks of Treatment: 2 N/A N/A Wound Status: Open N/A N/A Measurements L x W x D 0.9x0.7x0.1 N/A N/A (cm) Area (cm) : 0.495 N/A N/A Volume (cm) : 0.049 N/A N/A % Reduction in Area: 21.20% N/A N/A % Reduction in Volume: 61.10% N/A N/A Position 1 (o'clock): 9 Maximum Distance 1 (cm): 0.2 Tunneling: Yes N/A N/A Classification: Full Thickness Without N/A N/A Exposed Support Structures Exudate Amount: Small N/A N/A Exudate Type: Serous N/A N/A Exudate Color: amber N/A N/A Wound Margin: Distinct, outline attached N/A N/A Granulation Amount: None Present (0%) N/A N/A Necrotic Amount: Medium (34-66%) N/A N/A Exposed Structures: Fat Layer (Subcutaneous N/A N/A Tissue) Exposed: Yes Fascia: No LAMONTE, HARTT ([id number]) Tendon: No Muscle: No Joint: No Bone: No Epithelialization: None N/A N/A Periwound Skin Texture: Excoriation: No N/A N/A Induration: No Callus: No Crepitus: No Rash: No Scarring: No Periwound Skin  Moisture: Maceration: Yes N/A N/A Dry/Scaly: No Periwound Skin Color: Atrophie Blanche: No N/A N/A Cyanosis: No Ecchymosis: No Erythema: No Hemosiderin Staining: No Mottled: No Pallor: No Rubor: No Temperature: No Abnormality N/A N/A Tenderness on Palpation: No N/A N/A Wound Preparation: Ulcer Cleansing: N/A N/A Rinsed/Irrigated with Saline Topical Anesthetic Applied: Other: lidocaine 4% Treatment Notes Wound #1 (Right, Lateral Lower Leg) Notes endoform secured with BFD Electronic Signature(s) Signed: 01/07/2018 4:51:54 PM By: Linton Ham MD Entered By: Linton Ham on 01/07/2018 12:17:12 Raymond Barnett ([id number]) -------------------------------------------------------------------------------- Multi-Disciplinary Care Plan Details Patient Name: Raymond Barnett Date of Service: 01/07/2018 11:00 AM Medical Record Number: [id number] Patient Account Number: [id number] Date of Birth/Sex: Oct 08, 1929 (84 y.o. M) Treating RN: Cornell Barman Primary Care Freddye Cardamone: Nobie Putnam Other Clinician: Referring Ellyssa Zagal: Nobie Putnam Treating Fox Salminen/Extender: Tito Dine in Treatment: 2 Active Inactive ` Orientation to the Wound Care Program Nursing Diagnoses: Knowledge deficit related to the wound healing center program Goals: Patient/caregiver will verbalize understanding of the [place] Program Date Initiated: 12/24/2017 Target Resolution Date: 01/24/2018 Goal Status: Active Interventions: Provide education on orientation to the wound center Notes: ` Wound/Skin Impairment Nursing Diagnoses: Impaired tissue integrity Knowledge deficit related to ulceration/compromised skin integrity Goals: Ulcer/skin breakdown will have a volume reduction of 30% by week 4 Date Initiated: 12/24/2017 Target Resolution Date: 01/24/2018 Goal Status: Active Interventions: Provide education on ulcer and skin care Treatment Activities: Topical wound  management initiated : 12/24/2017 Notes: Electronic Signature(s) Signed: 01/08/2018 5:07:26 PM By: Gretta Cool, BSN, RN, CWS, Kim RN, BSN Entered By: Gretta Cool, BSN, RN, CWS, Kim on 01/07/2018 11:21:30 Raymond Barnett ([id number]) -------------------------------------------------------------------------------- Pain Assessment Details Patient Name: Raymond Barnett Date of Service: 01/07/2018 11:00 AM Medical Record Number: [id number] Patient Account Number: [id number] Date of Birth/Sex: 06/25/1929 (84 y.o. M) Treating RN: Cornell Barman Primary Care Corderius Saraceni: Nobie Putnam Other Clinician: Referring Kiosha Buchan: Nobie Putnam Treating Aarvi Stotts/Extender: Tito Dine in Treatment: 2 Active Problems Location of Pain Severity and Description of Pain Patient Has Paino Yes Site Locations Rate the pain. Current Pain Level: 4 Pain Management and Medication Current Pain Management: Electronic Signature(s) Signed: 01/07/2018 3:44:28 PM By: Lorine Bears RCP, RRT, CHT Signed: 01/08/2018 5:07:26 PM By: Gretta Cool, BSN, RN, CWS, Kim RN, BSN Entered By: Lorine Bears on 01/07/2018 10:51:12 Raymond Barnett ([id number]) -------------------------------------------------------------------------------- Patient/Caregiver Education Details Patient Name: Raymond Barnett Date of Service: 01/07/2018 11:00 AM  Medical Record Number: [id number] Patient Account Number: [id number] Date of Birth/Gender: 1929-04-27 (84 y.o. M) Treating RN: Cornell Barman Primary Care Physician: Nobie Putnam Other Clinician: Referring Physician: Nobie Putnam Treating Physician/Extender: Tito Dine in Treatment: 2 Education Assessment Education Provided To: Patient Education Topics Provided Wound/Skin Impairment: Handouts: Caring for Your Ulcer, Other: continue wound care as prescribed Methods: Demonstration, Explain/Verbal Responses: State content  correctly Electronic Signature(s) Signed: 01/08/2018 5:07:26 PM By: Gretta Cool, BSN, RN, CWS, Kim RN, BSN Entered By: Gretta Cool, BSN, RN, CWS, Kim on 01/07/2018 11:26:36 Raymond Barnett ([id number]) -------------------------------------------------------------------------------- Wound Assessment Details Patient Name: Raymond Barnett Date of Service: 01/07/2018 11:00 AM Medical Record Number: [id number] Patient Account Number: [id number] Date of Birth/Sex: 06-20-1929 (84 y.o. M) Treating RN: Secundino Ginger Primary Care Olive Zmuda: Nobie Putnam Other Clinician: Referring Zahmir Lalla: Nobie Putnam Treating Azaliah Carrero/Extender: Tito Dine in Treatment: 2 Wound Status Wound Number: 1 Primary Venous Leg Ulcer Etiology: Wound Location: Right Lower Leg - Lateral Wound Open Wounding Event: Other Lesion Status: Date Acquired: 11/20/2017 Comorbid Glaucoma, Hypertension, Peripheral Venous Weeks Of Treatment: 2 History: Disease, Rheumatoid Arthritis Clustered Wound: No Photos Photo Uploaded By: Secundino Ginger on 01/07/2018 11:53:36 Wound Measurements Length: (cm) 0.9 Width: (cm) 0.7 Depth: (cm) 0.1 Area: (cm) 0.495 Volume: (cm) 0.049 % Reduction in Area: 21.2% % Reduction in Volume: 61.1% Epithelialization: None Tunneling: Yes Position (o'clock): 9 Maximum Distance: (cm) 0.2 Undermining: No Wound Description Full Thickness Without Exposed Support Foul Odo Classification: Structures Slough/F Wound Margin: Distinct, outline attached Exudate Small Amount: Exudate Type: Serous Exudate Color: amber r After Cleansing: No ibrino Yes Wound Bed Granulation Amount: None Present (0%) Exposed Structure Necrotic Amount: Medium (34-66%) Fascia Exposed: No Necrotic Quality: Adherent Slough Fat Layer (Subcutaneous Tissue) Exposed: Yes Tendon Exposed: No TYWONE, BEMBENEK ([id number]) Muscle Exposed: No Joint Exposed: No Bone Exposed: No Periwound Skin Texture Texture  Color No Abnormalities Noted: No No Abnormalities Noted: No Callus: No Atrophie Blanche: No Crepitus: No Cyanosis: No Excoriation: No Ecchymosis: No Induration: No Erythema: No Rash: No Hemosiderin Staining: No Scarring: No Mottled: No Pallor: No Moisture Rubor: No No Abnormalities Noted: No Dry / Scaly: No Temperature / Pain Maceration: Yes Temperature: No Abnormality Wound Preparation Ulcer Cleansing: Rinsed/Irrigated with Saline Topical Anesthetic Applied: Other: lidocaine 4%, Treatment Notes Wound #1 (Right, Lateral Lower Leg) Notes endoform secured with BFD Electronic Signature(s) Signed: 01/07/2018 1:04:55 PM By: Secundino Ginger Entered By: Secundino Ginger on 01/07/2018 11:09:36 Raymond Barnett ([id number]) -------------------------------------------------------------------------------- [place] Details Patient Name: Raymond Barnett Date of Service: 01/07/2018 11:00 AM Medical Record Number: [id number] Patient Account Number: [id number] Date of Birth/Sex: 06-09-1929 (84 y.o. M) Treating RN: Cornell Barman Primary Care Ludie Hudon: Nobie Putnam Other Clinician: Referring Jivan Symanski: Nobie Putnam Treating Gladys Deckard/Extender: Tito Dine in Treatment: 2 Vital Signs Time Taken: 10:50 Temperature (F): 97.8 Height (in): 70 Pulse (bpm): 69 Weight (lbs): 211 Respiratory Rate (breaths/min): 16 Body Mass Index (BMI): 30.3 Blood Pressure (mmHg): 117/46 Reference Range: 80 - 120 mg / dl Electronic Signature(s) Signed: 01/07/2018 3:44:28 PM By: Lorine Bears RCP, RRT, CHT Entered By: Becky Sax, Amado Nash on 01/07/2018 10:53:24

## 2018-01-10 NOTE — Progress Notes (Signed)
DICKSON, KOSTELNIK ([id number]) Visit Report for 01/07/2018 HPI Details Patient Name: Raymond Barnett, Raymond Barnett Date of Service: 01/07/2018 11:00 AM Medical Record Number: [id number] Patient Account Number: [id number] Date of Birth/Sex: [birth year] (84 y.o. M) Treating RN: Cornell Barman Primary Care Provider: Nobie Putnam Other Clinician: Referring Provider: Nobie Putnam Treating Provider/Extender: Tito Dine in Treatment: 2 History of Present Illness Severity: furthermore the TBI was 0.33 on the right HPI Description: ADMISSION 12/24/17 This is an 84 year old man who arrives for review of wound on his right lateral ankle area. He states this happened about a year ago when he picked the scab off the area and the wound opened and never really healed. He has been followed by Dr. Governor Specking at [facility] wound care center. He is apparently used a multitude of topical dressings as well as skin substitutes however I'm not certain at this point with scattered skin substitutes were used. His last visit there was on 10/09/17 he's been using sorbact with hydrogel and a covering dressing. He states the wound is actually gotten better with less depth and wound volume. He is frustrated by the slowness of the healing and he is coming here for review. He also lives in [place] and this is a lot closer for him in terms of traveling. I have not had a chance to look through all of Dr. Samule Ohm notes however the wound was listed as a venous ulcer at one point. He had an x-ray done in December 2018 that did not show evidence of osteomyelitis. According to the patient osteomyelitis was ruled out. The patient actually has a complex history in this area. He suffered a serious fracture in the area perhaps 60 years ago while he was in the [employer] when [employer] over his ankle. He had a multitude of surgeries, bone graft and ultimately an ankle fusion. Apparently all the hardware however  has been taken out of the ankle quite a while ago. He does not however have a prior wound history. The patient also saw his cardiologist in May who appropriately ordered vascular tests. This showed an ABI in the right of 0.6 on the left of 1.07. This showed monophasic waveforms and all of the tibial vessels. 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 Also at 30-49% stenosis of the distal popliteal. furthermore TBI was 0.33 on the right which is markedly reduced. Looking at Dr. Marisue Humble Notes the patient refused to see an interventionalist 12/31/17; right lateral ankle wound.. He sees Dr. and his cardiologist this afternoon and they will discuss further peripheral artery evaluation. He is using Endoform. 01/07/18; right lateral ankle wound. He saw [name] was his cardiologist. He seems to be of the opinion that the patient has critical limb ischemia as a cause for nonhealing of this area. I certainly agree with that. Nevertheless the patient will not agree to an invasive test i.e. an angiogram. Therefore we are left with attempting wound care. We are using Endoform. His wife changes the dressings Electronic Signature(s) Signed: 01/07/2018 4:51:54 PM By: Linton Ham MD Entered By: Linton Ham on 01/07/2018 12:19:56 Raymond Barnett ([id number]) -------------------------------------------------------------------------------- Physical Exam Details Patient Name: Raymond Barnett Date of Service: 01/07/2018 11:00 AM Medical Record Number: [id number] Patient Account Number: [id number] Date of Birth/Sex: [birth year] (84 y.o. M) Treating RN: Cornell Barman Primary Care Provider: Nobie Putnam Other Clinician: Referring Provider: Nobie Putnam Treating Provider/Extender: Tito Dine in Treatment: 2 Constitutional Sitting or standing Blood Pressure is within target range for  patient.. Pulse regular and within target range for patient.Marland Kitchen Respirations regular, non-labored and  within target range.. Temperature is normal and within the target range for the patient.Marland Kitchen appears in no distress. Eyes Conjunctivae clear. No discharge. Respiratory Respiratory effort is easy and symmetric bilaterally. Rate is normal at rest and on room air.. Cardiovascular Pedal pulses absent bilaterally.. Musculoskeletal previous surgical scars in the leg as noted previously. No major edema. Psychiatric No evidence of depression, anxiety, or agitation. Calm, cooperative, and communicative. Appropriate interactions and affect.. Notes when exam; patient's wound actually looks better with epithelialization improving. There is a small area that goes towards the malleolus itself inferiorly. I do not actually feel bone and there is no need for debridement. We'll need to make sure that the Endoform reaches this area. Otherwise improvement in terms of the epithelialization Electronic Signature(s) Signed: 01/07/2018 4:51:54 PM By: Linton Ham MD Entered By: Linton Ham on 01/07/2018 12:22:33 Raymond Barnett ([id number]) -------------------------------------------------------------------------------- Physician Orders Details Patient Name: Raymond Barnett Date of Service: 01/07/2018 11:00 AM Medical Record Number: [id number] Patient Account Number: [id number] Date of Birth/Sex: [birth year] (84 y.o. M) Treating RN: Cornell Barman Primary Care Provider: Nobie Putnam Other Clinician: Referring Provider: Nobie Putnam Treating Provider/Extender: Tito Dine in Treatment: 2 Verbal / Phone Orders: No Diagnosis Coding Wound Cleansing Wound #1 Right,Lateral Lower Leg o Clean wound with Normal Saline. Anesthetic (add to Medication List) Wound #1 Right,Lateral Lower Leg o Topical Lidocaine 4% cream applied to wound bed prior to debridement (In Clinic Only). Primary Wound Dressing Wound #1 Right,Lateral Lower Leg o Other: - Endoform (also pack into tunnel at  10:00) Secondary Dressing Wound #1 Right,Lateral Lower Leg o Boardered Foam Dressing Dressing Change Frequency Wound #1 Right,Lateral Lower Leg o Change Dressing Monday, Wednesday, Friday Follow-up Appointments Wound #1 Right,Lateral Lower Leg o Return Appointment in 2 weeks. Additional Orders / Instructions Wound #1 Right,Lateral Lower Leg o Increase protein intake. Electronic Signature(s) Signed: 01/07/2018 4:51:54 PM By: Linton Ham MD Signed: 01/08/2018 5:07:26 PM By: Gretta Cool, BSN, RN, CWS, Kim RN, BSN Entered By: Gretta Cool, BSN, RN, CWS, Kim on 01/07/2018 11:24:35 IGOR, BISHOP ([id number]) -------------------------------------------------------------------------------- Problem List Details Patient Name: Raymond Barnett Date of Service: 01/07/2018 11:00 AM Medical Record Number: [id number] Patient Account Number: [id number] Date of Birth/Sex: 1929-05-07 (84 y.o. M) Treating RN: Cornell Barman Primary Care Provider: Nobie Putnam Other Clinician: Referring Provider: Nobie Putnam Treating Provider/Extender: Tito Dine in Treatment: 2 Active Problems ICD-10 Evaluated Encounter Code Description Active Date Today Diagnosis L97.311 Non-pressure chronic ulcer of right ankle limited to 12/24/2017 No Yes breakdown of skin I70.233 Atherosclerosis of native arteries of right leg with ulceration of 12/24/2017 No Yes ankle Inactive Problems Resolved Problems Electronic Signature(s) Signed: 01/07/2018 4:51:54 PM By: Linton Ham MD Entered By: Linton Ham on 01/07/2018 12:17:02 Raymond Barnett ([id number]) -------------------------------------------------------------------------------- Progress Note Details Patient Name: Raymond Barnett Date of Service: 01/07/2018 11:00 AM Medical Record Number: [id number] Patient Account Number: [id number] Date of Birth/Sex: 1929/08/21 (84 y.o. M) Treating RN: Cornell Barman Primary Care Provider: Nobie Putnam Other Clinician: Referring Provider: Nobie Putnam Treating Provider/Extender: Tito Dine in Treatment: 2 Subjective History of Present Illness (HPI) The following HPI elements were documented for the patient's wound: Severity: furthermore the TBI was 0.33 on the right ADMISSION 12/24/17 This is an 84 year old man who arrives for review of wound on his right lateral ankle area. He states this happened about a year ago when he picked the scab  off the area and the wound opened and never really healed. He has been followed by Dr. Governor Specking at [facility] wound care center. He is apparently used a multitude of topical dressings as well as skin substitutes however I'm not certain at this point with scattered skin substitutes were used. His last visit there was on 10/09/17 he's been using sorbact with hydrogel and a covering dressing. He states the wound is actually gotten better with less depth and wound volume. He is frustrated by the slowness of the healing and he is coming here for review. He also lives in [place] and this is a lot closer for him in terms of traveling. I have not had a chance to look through all of Dr. Samule Ohm notes however the wound was listed as a venous ulcer at one point. He had an x-ray done in December 2018 that did not show evidence of osteomyelitis. According to the patient osteomyelitis was ruled out. The patient actually has a complex history in this area. He suffered a serious fracture in the area perhaps 60 years ago while he was in the [employer] when [employer] over his ankle. He had a multitude of surgeries, bone graft and ultimately an ankle fusion. Apparently all the hardware however has been taken out of the ankle quite a while ago. He does not however have a prior wound history. The patient also saw his cardiologist in May who appropriately ordered vascular tests. This showed an ABI in the right of 0.6 on the left of  1.07. This showed monophasic waveforms and all of the tibial vessels. 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 Also at 30-49% stenosis of the distal popliteal. furthermore TBI was 0.33 on the right which is markedly reduced. Looking at Dr. Marisue Humble Notes the patient refused to see an interventionalist 12/31/17; right lateral ankle wound.. He sees Dr. and his cardiologist this afternoon and they will discuss further peripheral artery evaluation. He is using Endoform. 01/07/18; right lateral ankle wound. He saw [name] was his cardiologist. He seems to be of the opinion that the patient has critical limb ischemia as a cause for nonhealing of this area. I certainly agree with that. Nevertheless the patient will not agree to an invasive test i.e. an angiogram. Therefore we are left with attempting wound care. We are using Endoform. His wife changes the dressings Objective Constitutional Sitting or standing Blood Pressure is within target range for patient.. Pulse regular and within target range for patient.Marland Kitchen Respirations regular, non-labored and within target range.. Temperature is normal and within the target range for the patient.Marland Kitchen Raymond Barnett, Raymond Barnett ([id number]) appears in no distress. Vitals Time Taken: 10:50 AM, Height: 70 in, Weight: 211 lbs, BMI: 30.3, Temperature: 97.8 F, Pulse: 69 bpm, Respiratory Rate: 16 breaths/min, Blood Pressure: 117/46 mmHg. Eyes Conjunctivae clear. No discharge. Respiratory Respiratory effort is easy and symmetric bilaterally. Rate is normal at rest and on room air.. Cardiovascular Pedal pulses absent bilaterally.. Musculoskeletal previous surgical scars in the leg as noted previously. No major edema. Psychiatric No evidence of depression, anxiety, or agitation. Calm, cooperative, and communicative. Appropriate interactions and affect.. General Notes: when exam; patient's wound actually looks better with epithelialization improving. There is a small area that goes  towards the malleolus itself inferiorly. I do not actually feel bone and there is no need for debridement. We'll need to make sure that the Endoform reaches this area. Otherwise improvement in terms of the epithelialization Integumentary (Hair, Skin) Wound #1 status is  Open. Original cause of wound was Other Lesion. The wound is located on the Right,Lateral Lower Leg. The wound measures 0.9cm length x 0.7cm width x 0.1cm depth; 0.495cm^2 area and 0.049cm^3 volume. There is Fat Layer (Subcutaneous Tissue) Exposed exposed. There is no undermining noted, however, there is tunneling at 9:00 with a maximum distance of 0.2cm. There is a small amount of serous drainage noted. The wound margin is distinct with the outline attached to the wound base. There is no granulation within the wound bed. There is a medium (34-66%) amount of necrotic tissue within the wound bed including Adherent Slough. The periwound skin appearance exhibited: Maceration. The periwound skin appearance did not exhibit: Callus, Crepitus, Excoriation, Induration, Rash, Scarring, Dry/Scaly, Atrophie Blanche, Cyanosis, Ecchymosis, Hemosiderin Staining, Mottled, Pallor, Rubor, Erythema. Periwound temperature was noted as No Abnormality. 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 Raymond Barnett, Raymond Barnett ([id number]) Anesthetic (add to Medication List): Wound #1 Right,Lateral Lower Leg: Topical Lidocaine 4% cream applied to wound bed prior to debridement (In Clinic Only). Primary Wound Dressing: Wound #1 Right,Lateral Lower Leg: Other: - Endoform (also pack into tunnel at 10:00) Secondary Dressing: Wound #1 Right,Lateral Lower Leg: Boardered Foam Dressing Dressing Change Frequency: Wound #1 Right,Lateral Lower  Leg: Change Dressing Monday, Wednesday, Friday Follow-up Appointments: Wound #1 Right,Lateral Lower Leg: Return Appointment in 2 weeks. Additional Orders / Instructions: Wound #1 Right,Lateral Lower Leg: Increase protein intake. #11 continue Endoform. We seem to be making some progress in spite of the arterial limitations here.where using a border foam dressing. #2 advanced treatment options came to mind however Endoform has resulted in good return of epithelialization in this difficult area except for the small inferior part which is still probing along the malleolus itself without evidence of exposed bone Electronic Signature(s) Signed: 01/07/2018 4:51:54 PM By: Linton Ham MD Entered By: Linton Ham on 01/07/2018 12:23:50 Raymond Barnett ([id number]) -------------------------------------------------------------------------------- Lely Details Patient Name: Raymond Barnett Date of Service: 01/07/2018 Medical Record Number: [id number] Patient Account Number: [id number] Date of Birth/Sex: 03/08/1930 (84 y.o. 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

## 2018-01-21 ENCOUNTER — Encounter: Payer: Medicare Other | Attending: Internal Medicine | Admitting: Internal Medicine

## 2018-01-21 ENCOUNTER — Ambulatory Visit (INDEPENDENT_AMBULATORY_CARE_PROVIDER_SITE_OTHER): Payer: Medicare Other

## 2018-01-21 DIAGNOSIS — L97312 Non-pressure chronic ulcer of right ankle with fat layer exposed: Secondary | ICD-10-CM | POA: Diagnosis not present

## 2018-01-21 DIAGNOSIS — I10 Essential (primary) hypertension: Secondary | ICD-10-CM | POA: Insufficient documentation

## 2018-01-21 DIAGNOSIS — M069 Rheumatoid arthritis, unspecified: Secondary | ICD-10-CM | POA: Diagnosis not present

## 2018-01-21 DIAGNOSIS — I70233 Atherosclerosis of native arteries of right leg with ulceration of ankle: Secondary | ICD-10-CM | POA: Insufficient documentation

## 2018-01-21 DIAGNOSIS — Z23 Encounter for immunization: Secondary | ICD-10-CM

## 2018-01-21 DIAGNOSIS — I872 Venous insufficiency (chronic) (peripheral): Secondary | ICD-10-CM | POA: Diagnosis not present

## 2018-01-21 DIAGNOSIS — L97311 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 of skin: Secondary | ICD-10-CM | POA: Diagnosis not present

## 2018-01-24 NOTE — Progress Notes (Signed)
JUAQUIN, LUDINGTON ([id number]) Visit Report for 01/21/2018 Debridement Details Patient Name: Raymond Barnett, Raymond Barnett Date of Service: 01/21/2018 12:45 PM Medical Record Number: [id number] Patient Account Number: [id number] Date of Birth/Sex: 1929/08/19 (84 y.o. M) Treating RN: Cornell Barman Primary Care Provider: Nobie Putnam Other Clinician: Referring Provider: Nobie Putnam Treating Provider/Extender: Tito Dine in Treatment: 4 Debridement Performed for Wound #1 Right,Lateral Lower Leg Assessment: Performed By: Physician Ricard Dillon, MD Debridement Type: Debridement Severity of Tissue Pre Fat layer exposed Debridement: Level of Consciousness (Pre- Awake and Alert procedure): Pre-procedure Verification/Time Yes - 13:05 Out Taken: Start Time: 13:05 Pain Control: Other : lidocaine 4% Total Area Debrided (L x W): 0.9 (cm) x 0.7 (cm) = 0.63 (cm) Tissue and other material Non-Viable, Slough, Slough debrided: Level: Non-Viable Tissue Debridement Description: Selective/Open Wound Instrument: Curette Bleeding: Minimum Hemostasis Achieved: Pressure Response to Treatment: Procedure was tolerated well Level of Consciousness Awake and Alert (Post-procedure): Post Debridement Measurements of Total Wound Length: (cm) 0.9 Width: (cm) 0.7 Depth: (cm) 0.3 Volume: (cm) 0.148 Character of Wound/Ulcer Post Debridement: Stable Severity of Tissue Post Debridement: Fat layer exposed Post Procedure Diagnosis Same as Pre-procedure Electronic Signature(s) Signed: 01/22/2018 5:56:52 AM By: Linton Ham MD Signed: 01/22/2018 3:05:38 PM By: Gretta Cool, BSN, RN, CWS, Kim RN, BSN Entered By: Linton Ham on 01/21/2018 13:30:52 Raymond Barnett ([id number]) -------------------------------------------------------------------------------- HPI Details Patient Name: Raymond Barnett Date of Service: 01/21/2018 12:45 PM Medical Record Number: [id number] Patient Account  Number: [id number] Date of Birth/Sex: 01-Jun-1929 (84 y.o. M) Treating RN: Cornell Barman Primary Care Provider: Nobie Putnam Other Clinician: Referring Provider: Nobie Putnam Treating Provider/Extender: Tito Dine in Treatment: 4 History of Present Illness Severity: furthermore the TBI was 0.33 on the right HPI Description: ADMISSION 12/24/17 This is an 84 year old man who arrives for review of wound on his right lateral ankle area. He states this happened about a year ago when he picked the scab off the area and the wound opened and never really healed. He has been followed by Dr. Governor Specking at [facility] wound care center. He is apparently used a multitude of topical dressings as well as skin substitutes however I'm not certain at this point with scattered skin substitutes were used. His last visit there was on 10/09/17 he's been using sorbact with hydrogel and a covering dressing. He states the wound is actually gotten better with less depth and wound volume. He is frustrated by the slowness of the healing and he is coming here for review. He also lives in [place] and this is a lot closer for him in terms of traveling. I have not had a chance to look through all of Dr. Samule Ohm notes however the wound was listed as a venous ulcer at one point. He had an x-ray done in December 2018 that did not show evidence of osteomyelitis. According to the patient osteomyelitis was ruled out. The patient actually has a complex history in this area. He suffered a serious fracture in the area perhaps 60 years ago while he was in the [employer] when [employer] over his ankle. He had a multitude of surgeries, bone graft and ultimately an ankle fusion. Apparently all the hardware however has been taken out of the ankle quite a while ago. He does not however have a prior wound history. The patient also saw his cardiologist in May who appropriately ordered vascular tests.  This showed an ABI in the right of 0.6 on the left of 1.07. This showed  monophasic waveforms and all of the tibial vessels. 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 Also at 30-49% stenosis of the distal popliteal. furthermore TBI was 0.33 on the right which is markedly reduced. Looking at Dr. Marisue Humble Notes the patient refused to see an interventionalist 12/31/17; right lateral ankle wound.. He sees Dr. and his cardiologist this afternoon and they will discuss further peripheral artery evaluation. He is using Endoform. 01/07/18; right lateral ankle wound. He saw [name] was his cardiologist. He seems to be of the opinion that the patient has critical limb ischemia as a cause for nonhealing of this area. I certainly agree with that. Nevertheless the patient will not agree to an invasive test i.e. an angiogram. Therefore we are left with attempting wound care. We are using Endoform. His wife changes the dressings 01/21/18; right ankle wound just below the right lateral malleolus. Wound looks about the same. Surface does not look as bad as when he first came in here. There is a small divot a roughly 10:00 but I cannot feel bone. Using Endoform Electronic Signature(s) Signed: 01/22/2018 5:56:52 AM By: Linton Ham MD Entered By: Linton Ham on 01/21/2018 13:31:51 Raymond Barnett ([id number]) -------------------------------------------------------------------------------- Physical Exam Details Patient Name: Raymond Barnett Date of Service: 01/21/2018 12:45 PM Medical Record Number: [id number] Patient Account Number: [id number] Date of Birth/Sex: [birth year] (84 y.o. M) Treating RN: Cornell Barman Primary Care Provider: Nobie Putnam Other Clinician: Referring Provider: Nobie Putnam Treating Provider/Extender: Tito Dine in Treatment: 4 Constitutional Sitting or standing Blood Pressure is within target range for patient.. Pulse regular and within target range for  patient.Marland Kitchen Respirations regular, non-labored and within target range.. Temperature is normal and within the target range for the patient.Marland Kitchen appears in no distress. Notes Wound exam; base of the wound is not too bad. Still some adherent debris which I removed with a #3 curet. Wound bed does not look too bad. There is a probing depth at roughly 10:00 but no probable bone Electronic Signature(s) Signed: 01/22/2018 5:56:52 AM By: Linton Ham MD Entered By: Linton Ham on 01/21/2018 13:33:39 Raymond Barnett ([id number]) -------------------------------------------------------------------------------- Physician Orders Details Patient Name: Raymond Barnett Date of Service: 01/21/2018 12:45 PM Medical Record Number: [id number] Patient Account Number: [id number] Date of Birth/Sex: June 07, 1929 (84 y.o. M) Treating RN: Cornell Barman Primary Care Provider: Nobie Putnam Other Clinician: Referring Provider: Nobie Putnam Treating Provider/Extender: Tito Dine in Treatment: 4 Verbal / Phone Orders: No Diagnosis Coding Wound Cleansing Wound #1 Right,Lateral Lower Leg o Clean wound with Normal Saline. Anesthetic (add to Medication List) Wound #1 Right,Lateral Lower Leg o Topical Lidocaine 4% cream applied to wound bed prior to debridement (In Clinic Only). Primary Wound Dressing Wound #1 Right,Lateral Lower Leg o Other: - Endoform (also pack into tunnel at 10:00) Secondary Dressing Wound #1 Right,Lateral Lower Leg o Boardered Foam Dressing Dressing Change Frequency Wound #1 Right,Lateral Lower Leg o Change Dressing Monday, Wednesday, Friday Follow-up Appointments Wound #1 Right,Lateral Lower Leg o Return Appointment in 2 weeks. Additional Orders / Instructions Wound #1 Right,Lateral Lower Leg o Increase protein intake. Electronic Signature(s) Signed: 01/22/2018 5:56:52 AM By: Linton Ham MD Signed: 01/22/2018 3:05:38 PM By: Gretta Cool, BSN, RN,  CWS, Kim RN, BSN Entered By: Gretta Cool, BSN, RN, CWS, Kim on 01/21/2018 54:27:06 Raymond Barnett, Raymond Barnett ([id number]) -------------------------------------------------------------------------------- Problem List Details Patient Name: Raymond Barnett Date of Service: 01/21/2018 12:45 PM Medical Record Number: [id number] Patient Account Number: [id number] Date of Birth/Sex: Jul 29, 1929 (84 y.o. 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 M) Treating RN: Cornell Barman Primary Care Provider: Nobie Putnam Other Clinician: Referring Provider: Nobie Putnam Treating Provider/Extender: Tito Dine in Treatment: 4 Subjective History of Present Illness (HPI) The following HPI elements were documented for the patient's wound: Severity: furthermore the TBI was 0.33 on the right ADMISSION 12/24/17 This is an 84 year old man who arrives for review of wound on his right lateral ankle area. He states this happened about a year ago when he picked the scab off the area and the wound opened  and never really healed. He has been followed by Dr. Governor Specking at [facility] wound care center. He is apparently used a multitude of topical dressings as well as skin substitutes however I'm not certain at this point with scattered skin substitutes were used. His last visit there was on 10/09/17 he's been using sorbact with hydrogel and a covering dressing. He states the wound is actually gotten better with less depth and wound volume. He is frustrated by the slowness of the healing and he is coming here for review. He also lives in [place] and this is a lot closer for him in terms of traveling. I have not had a chance to look through all of Dr. Samule Ohm notes however the wound was listed as a venous ulcer at one point. He had an x-ray done in December 2018 that did not show evidence of osteomyelitis. According to the patient osteomyelitis was ruled out. The patient actually has a complex history in this area. He suffered a serious fracture in the area perhaps 60 years ago while he was in the [employer] when [employer] over his ankle. He had a multitude of surgeries, bone graft and ultimately an ankle fusion. Apparently all the hardware however has been taken out of the ankle quite a while ago. He does not however have a prior wound history. The patient also saw his cardiologist in May who appropriately ordered vascular tests. This showed an ABI in the right of 0.6 on the left of 1.07. This showed monophasic waveforms and all of the tibial vessels. 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 Also at 30-49% stenosis of the distal popliteal. furthermore TBI was 0.33 on the right which is markedly reduced. Looking at Dr. Marisue Humble Notes the patient refused to see an interventionalist 12/31/17; right lateral ankle wound.. He sees Dr. and his cardiologist this afternoon and they will discuss further peripheral artery evaluation. He is using Endoform. 01/07/18; right lateral ankle wound. He saw Dr.  Saunders Revel was his cardiologist. He seems to be of the opinion that the patient has critical limb ischemia as a cause for nonhealing of this area. I certainly agree with that. Nevertheless the patient will not agree to an invasive test i.e. an angiogram. Therefore we are left with attempting wound care. We are  using Endoform. His wife changes the dressings 01/21/18; right ankle wound just below the right lateral malleolus. Wound looks about the same. Surface does not look as bad as when he first came in here. There is a small divot a roughly 10:00 but I cannot feel bone. Using Endoform Objective Constitutional Raymond Barnett, Raymond Barnett ([id number]) Sitting or standing Blood Pressure is within target range for patient.. Pulse regular and within target range for patient.Marland Kitchen Respirations regular, non-labored and within target range.. Temperature is normal and within the target range for the patient.Marland Kitchen appears in no distress. Vitals Time Taken: 12:43 PM, Height: 70 in, Weight: 211 lbs, BMI: 30.3, Temperature: 97.7 F, Pulse: 70 bpm, Respiratory Rate: 16 breaths/min, Blood Pressure: 136/56 mmHg. General Notes: Wound exam; base of the wound is not too bad. Still some adherent debris which I removed with a #3 curet. Wound bed does not look too bad. There is a probing depth at roughly 10:00 but no probable bone Integumentary (Hair, Skin) Wound #1 status is Open. Original cause of wound was Other Lesion. The wound is located on the Right,Lateral Lower Leg. The wound measures 0.9cm length x 0.7cm width x 0.2cm depth; 0.495cm^2 area and 0.099cm^3 volume. There is Fat Layer (Subcutaneous Tissue) Exposed exposed. There is no tunneling or undermining noted. There is a small amount of purulent drainage noted. The wound margin is distinct with the outline attached to the wound base. There is no granulation within the wound bed. There is a large (67-100%) amount of necrotic tissue within the wound bed including Adherent Slough.  The periwound skin appearance exhibited: Maceration. The periwound skin appearance did not exhibit: Callus, Crepitus, Excoriation, Induration, Rash, Scarring, Dry/Scaly, Atrophie Blanche, Cyanosis, Ecchymosis, Hemosiderin Staining, Mottled, Pallor, Rubor, Erythema. Periwound temperature was noted as No Abnormality. 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 There was a Selective/Open Wound Non-Viable Tissue Debridement with a total area of 0.63 sq cm performed by Ricard Dillon, MD. With the following instrument(s): Curette to remove Non-Viable tissue/material. Material removed includes [facility] after achieving pain control using Other (lidocaine 4%). No specimens were taken. A time out was conducted at 13:05, prior to the start of the procedure. A Minimum amount of bleeding was controlled with Pressure. The procedure was tolerated well. Post Debridement Measurements: 0.9cm length x 0.7cm width x 0.3cm depth; 0.148cm^3 volume. Character of Wound/Ulcer Post Debridement is stable. Severity of Tissue Post Debridement is: Fat layer exposed. Post procedure Diagnosis Wound #1: Same as Pre-Procedure Plan Raymond Barnett, Raymond Barnett ([id number]) Wound Cleansing: Wound #1 Right,Lateral Lower Leg: Clean wound with Normal Saline. Anesthetic (add to Medication List): Wound #1 Right,Lateral Lower Leg: Topical Lidocaine 4% cream applied to wound bed prior to debridement (In Clinic Only). Primary Wound Dressing: Wound #1 Right,Lateral Lower Leg: Other: - Endoform (also pack into tunnel at 10:00) Secondary Dressing: Wound #1 Right,Lateral Lower Leg: Boardered Foam Dressing Dressing Change  Frequency: Wound #1 Right,Lateral Lower Leg: Change Dressing Monday, Wednesday, Friday Follow-up Appointments: Wound #1 Right,Lateral Lower Leg: Return Appointment in 2 weeks. Additional Orders / Instructions: Wound #1 Right,Lateral Lower Leg: Increase protein intake. #1 I continued with the Endoform as there has been some improvement. #2 again spent some time talking to him about an angiogram  with possible interventions #3 I did put graphix PL through his insurance. He tells me that he had something similar put on at [facility] once in the OR and once in the clinic. Electronic Signature(s) Signed: 01/22/2018 5:56:52 AM By: Linton Ham MD Entered By: Linton Ham on 01/21/2018 13:35:03 Raymond Barnett ([id number]) -------------------------------------------------------------------------------- SuperBill Details Patient Name: Raymond Barnett Date of Service: 01/21/2018 Medical Record Number: [id number] Patient Account Number: [id number] Date of Birth/Sex: 06-19-1929 (84 y.o. 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 Facility Procedures CPT4 Code: [id number] Description: [phone] - DEBRIDE WOUND 1ST 20 SQ CM OR < ICD-10 Diagnosis Description R84.128 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 I70.233 Atherosclerosis of native arteries of right leg with ulceratio Modifier: of skin n of ankle Quantity: 1 Physician Procedures CPT4 Code: [id number] Description: 71959 - WC PHYS DEBR WO ANESTH 20 SQ CM ICD-10 Diagnosis Description D47.185 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 I70.233  Atherosclerosis of native arteries of right leg with ulceration Modifier: of skin of ankle Quantity: 1 Electronic Signature(s) Signed: 01/22/2018 5:56:52 AM By: Linton Ham MD Entered By: Linton Ham on 01/21/2018 13:35:35

## 2018-01-24 NOTE — Progress Notes (Signed)
Raymond, Barnett ([id number]) Visit Report for 01/21/2018 Arrival Information Details Patient Name: Raymond Barnett, Raymond Barnett Date of Service: 01/21/2018 12:45 PM Medical Record Number: [id number] Patient Account Number: [id number] Date of Birth/Sex: 14-Apr-1930 (84 y.o. M) Treating RN: Cornell Barman Primary Care Reighlyn Elmes: Nobie Putnam Other Clinician: Referring Gwendloyn Forsee: Nobie Putnam Treating Natanel Snavely/Extender: Tito Dine in Treatment: 4 Visit Information History Since Last Visit Added or deleted any medications: No Patient Arrived: Cane Any new allergies or adverse reactions: No Arrival Time: 12:42 Had a fall or experienced change in No Accompanied By: wife activities of daily living that may affect Transfer Assistance: None risk of falls: Patient Identification Verified: Yes Signs or symptoms of abuse/neglect since last visito No Secondary Verification Process Completed: Yes Hospitalized since last visit: No Implantable device outside of the clinic excluding No cellular tissue based products placed in the center since last visit: Has Dressing in Place as Prescribed: Yes Pain Present Now: No Electronic Signature(s) Signed: 01/21/2018 1:31:10 PM By: Lorine Bears RCP, RRT, CHT Entered By: Lorine Bears on 01/21/2018 12:43:24 Raymond Barnett ([id number]) -------------------------------------------------------------------------------- Encounter Discharge Information Details Patient Name: Raymond Barnett Date of Service: 01/21/2018 12:45 PM Medical Record Number: [id number] Patient Account Number: [id number] Date of Birth/Sex: 1929/07/07 (84 y.o. M) Treating RN: Cornell Barman Primary Care Zymeir Salminen: Nobie Putnam Other Clinician: Referring Aryanna Shaver: Nobie Putnam Treating Maurya Nethery/Extender: Tito Dine in Treatment: 4 Encounter Discharge Information Items Discharge Condition: Stable Ambulatory Status:  Cane Discharge Destination: Home Transportation: Private Auto Schedule Follow-up Appointment: Yes Clinical Summary of Care: Post Procedure Vitals: Temperature (F): 97.7 Pulse (bpm): 70 Respiratory Rate (breaths/min): 18 Blood Pressure (mmHg): 131/56 Electronic Signature(s) Signed: 01/22/2018 3:05:38 PM By: Gretta Cool, BSN, RN, CWS, Kim RN, BSN Entered By: Gretta Cool, BSN, RN, CWS, Kim on 01/21/2018 13:23:25 Raymond Barnett ([id number]) -------------------------------------------------------------------------------- Lower Extremity Assessment Details Patient Name: Raymond Barnett Date of Service: 01/21/2018 12:45 PM Medical Record Number: [id number] Patient Account Number: [id number] Date of Birth/Sex: 24-Sep-1929 (84 y.o. M) Treating RN: Secundino Ginger Primary Care Kiosha Buchan: Nobie Putnam Other Clinician: Referring Giankarlo Leamer: Nobie Putnam Treating Katti Pelle/Extender: Tito Dine in Treatment: 4 Edema Assessment Assessed: [Left: No] [Right: No] [Left: Edema] [Right: :] Calf Left: Right: Point of Measurement: 37 cm From Medial Instep cm 31.5 cm Ankle Left: Right: Point of Measurement: 14 cm From Medial Instep cm 24 cm Vascular Assessment Claudication: Claudication Assessment [Right:None] Pulses: Dorsalis Pedis Palpable: [Right:Yes] Posterior Tibial Extremity colors, hair growth, and conditions: Extremity Color: [Right:Hyperpigmented] Hair Growth on Extremity: [Right:No] Temperature of Extremity: [Right:Cool] Capillary Refill: [Right:< 3 seconds] Toe Nail Assessment Left: Right: Thick: No Discolored: No Deformed: No Improper Length and Hygiene: No Electronic Signature(s) Signed: 01/21/2018 1:11:45 PM By: Secundino Ginger Entered By: Secundino Ginger on 01/21/2018 12:58:09 Raymond Barnett ([id number]) -------------------------------------------------------------------------------- Multi Wound Chart Details Patient Name: Raymond Barnett Date of Service: 01/21/2018  12:45 PM Medical Record Number: [id number] Patient Account Number: [id number] Date of Birth/Sex: Nov 09, 1929 (84 y.o. M) Treating RN: Cornell Barman Primary Care Neymar Dowe: Nobie Putnam Other Clinician: Referring Rian Busche: Nobie Putnam Treating Rieley Khalsa/Extender: Tito Dine in Treatment: 4 Vital Signs Height(in): 70 Pulse(bpm): 70 Weight(lbs): 211 Blood Pressure(mmHg): 136/56 Body Mass Index(BMI): 30 Temperature(F): 97.7 Respiratory Rate 16 (breaths/min): Photos: [N/A:N/A] Wound Location: Right Lower Leg - Lateral N/A N/A Wounding Event: Other Lesion N/A N/A Primary Etiology: Venous Leg Ulcer N/A N/A Comorbid History: Glaucoma, Hypertension, N/A N/A Peripheral Venous Disease, Rheumatoid Arthritis Date Acquired: 11/20/2017 N/A N/A Weeks of Treatment: 4  N/A N/A Wound Status: Open N/A N/A Measurements L x W x D 0.9x0.7x0.2 N/A N/A (cm) Area (cm) : 0.495 N/A N/A Volume (cm) : 0.099 N/A N/A % Reduction in Area: 21.20% N/A N/A % Reduction in Volume: 21.40% N/A N/A Classification: Full Thickness Without N/A N/A Exposed Support Structures Exudate Amount: Small N/A N/A Exudate Type: Purulent N/A N/A Exudate Color: yellow, brown, green N/A N/A Wound Margin: Distinct, outline attached N/A N/A Granulation Amount: None Present (0%) N/A N/A Necrotic Amount: Large (67-100%) N/A N/A Exposed Structures: Fat Layer (Subcutaneous N/A N/A Tissue) Exposed: Yes Fascia: No Tendon: No Muscle: No Raymond, Barnett ([id number]) Joint: No Bone: No Epithelialization: None N/A N/A Debridement: Debridement - Selective/Open N/A N/A Wound Pre-procedure 13:05 N/A N/A Verification/Time Out Taken: Pain Control: Other N/A N/A Tissue Debrided: Slough N/A N/A Level: Non-Viable Tissue N/A N/A Debridement Area (sq cm): 0.63 N/A N/A Instrument: Curette N/A N/A Bleeding: Minimum N/A N/A Hemostasis Achieved: Pressure N/A N/A Debridement Treatment Procedure was tolerated  well N/A N/A Response: Post Debridement 0.9x0.7x0.3 N/A N/A Measurements L x W x D (cm) Post Debridement Volume: 0.148 N/A N/A (cm) Periwound Skin Texture: Excoriation: No N/A N/A Induration: No Callus: No Crepitus: No Rash: No Scarring: No Periwound Skin Moisture: Maceration: Yes N/A N/A Dry/Scaly: No Periwound Skin Color: Atrophie Blanche: No N/A N/A Cyanosis: No Ecchymosis: No Erythema: No Hemosiderin Staining: No Mottled: No Pallor: No Rubor: No Temperature: No Abnormality N/A N/A Tenderness on Palpation: No N/A N/A Wound Preparation: Ulcer Cleansing: N/A N/A Rinsed/Irrigated with Saline Topical Anesthetic Applied: Other: lidocaine 4% Procedures Performed: Debridement N/A N/A Treatment Notes Wound #1 (Right, Lateral Lower Leg) 1. Cleansed with: Clean wound with Normal Saline 2. Anesthetic Topical Lidocaine 4% cream to wound bed prior to debridement 4. Dressing Applied: Other dressing (specify in notes) Notes endoform secured with BFD ABDIRAHMAN, CHITTUM ([id number]) Electronic Signature(s) Signed: 01/22/2018 5:56:52 AM By: Linton Ham MD Entered By: Linton Ham on 01/21/2018 13:30:30 Raymond Barnett ([id number]) -------------------------------------------------------------------------------- Multi-Disciplinary Care Plan Details Patient Name: Raymond Barnett Date of Service: 01/21/2018 12:45 PM Medical Record Number: [id number] Patient Account Number: [id number] Date of Birth/Sex: [birth year] (84 y.o. M) Treating RN: Cornell Barman Primary Care Markeis Allman: Nobie Putnam Other Clinician: Referring Brinton Brandel: Nobie Putnam Treating Lorena Clearman/Extender: Tito Dine in Treatment: 4 Active Inactive ` Orientation to the Wound Care Program Nursing Diagnoses: Knowledge deficit related to the wound healing center program Goals: Patient/caregiver will verbalize understanding of the [facility] Program Date Initiated:  12/24/2017 Target Resolution Date: 01/24/2018 Goal Status: Active Interventions: Provide education on orientation to the wound center Notes: ` Wound/Skin Impairment Nursing Diagnoses: Impaired tissue integrity Knowledge deficit related to ulceration/compromised skin integrity Goals: Ulcer/skin breakdown will have a volume reduction of 30% by week 4 Date Initiated: 12/24/2017 Target Resolution Date: 01/24/2018 Goal Status: Active Interventions: Provide education on ulcer and skin care Treatment Activities: Topical wound management initiated : 12/24/2017 Notes: Electronic Signature(s) Signed: 01/22/2018 3:05:38 PM By: Gretta Cool, BSN, RN, CWS, Kim RN, BSN Entered By: Gretta Cool, BSN, RN, CWS, Kim on 01/21/2018 13:06:56 Raymond Barnett ([id number]) -------------------------------------------------------------------------------- Pain Assessment Details Patient Name: Raymond Barnett Date of Service: 01/21/2018 12:45 PM Medical Record Number: [id number] Patient Account Number: [id number] Date of Birth/Sex: 11/22/1929 (84 y.o. M) Treating RN: Cornell Barman Primary Care Ailene Royal: Nobie Putnam Other Clinician: Referring Massey Ruhland: Nobie Putnam Treating Quamere Mussell/Extender: Tito Dine in Treatment: 4 Active Problems Location of Pain Severity and Description of Pain Patient Has Paino No Site  Locations Pain Management and Medication Current Pain Management: Electronic Signature(s) Signed: 01/21/2018 1:31:10 PM By: Lorine Bears RCP, RRT, CHT Signed: 01/22/2018 3:05:38 PM By: Gretta Cool, BSN, RN, CWS, Kim RN, BSN Entered By: Lorine Bears on 01/21/2018 12:43:31 Raymond Barnett ([id number]) -------------------------------------------------------------------------------- Patient/Caregiver Education Details Patient Name: Raymond Barnett Date of Service: 01/21/2018 12:45 PM Medical Record Number: [id number] Patient Account Number: [id number] Date of  Birth/Gender: 1930/01/23 (84 y.o. M) Treating RN: Cornell Barman Primary Care Physician: Nobie Putnam Other Clinician: Referring Physician: Nobie Putnam Treating Physician/Extender: Tito Dine in Treatment: 4 Education Assessment Education Provided To: Patient Education Topics Provided Wound/Skin Impairment: Handouts: Caring for Your Ulcer Methods: Demonstration, Explain/Verbal Responses: State content correctly Electronic Signature(s) Signed: 01/22/2018 3:05:38 PM By: Gretta Cool, BSN, RN, CWS, Kim RN, BSN Entered By: Gretta Cool, BSN, RN, CWS, Kim on 01/21/2018 13:23:29 Raymond Barnett ([id number]) -------------------------------------------------------------------------------- Wound Assessment Details Patient Name: Raymond Barnett Date of Service: 01/21/2018 12:45 PM Medical Record Number: [id number] Patient Account Number: [id number] Date of Birth/Sex: [birth year] (84 y.o. M) Treating RN: Secundino Ginger Primary Care Tee Richeson: Nobie Putnam Other Clinician: Referring Russell Quinney: Nobie Putnam Treating Shaylin Blatt/Extender: Tito Dine in Treatment: 4 Wound Status Wound Number: 1 Primary Venous Leg Ulcer Etiology: Wound Location: Right Lower Leg - Lateral Wound Open Wounding Event: Other Lesion Status: Date Acquired: 11/20/2017 Comorbid Glaucoma, Hypertension, Peripheral Venous Weeks Of Treatment: 4 History: Disease, Rheumatoid Arthritis Clustered Wound: No Photos Photo Uploaded By: Secundino Ginger on 01/21/2018 13:06:47 Wound Measurements Length: (cm) 0.9 Width: (cm) 0.7 Depth: (cm) 0.2 Area: (cm) 0.495 Volume: (cm) 0.099 % Reduction in Area: 21.2% % Reduction in Volume: 21.4% Epithelialization: None Tunneling: No Undermining: No Wound Description Full Thickness Without Exposed Support Foul Odo Classification: Structures Slough/F Wound Margin: Distinct, outline attached Exudate Small Amount: Exudate Type: Purulent Exudate  Color: yellow, brown, green r After Cleansing: No ibrino Yes Wound Bed Granulation Amount: None Present (0%) Exposed Structure Necrotic Amount: Large (67-100%) Fascia Exposed: No Necrotic Quality: Adherent Slough Fat Layer (Subcutaneous Tissue) Exposed: Yes Tendon Exposed: No Muscle Exposed: No Joint Exposed: No Bone Exposed: No DAVY, WESTMORELAND. ([id number]) Periwound Skin Texture Texture Color No Abnormalities Noted: No No Abnormalities Noted: No Callus: No Atrophie Blanche: No Crepitus: No Cyanosis: No Excoriation: No Ecchymosis: No Induration: No Erythema: No Rash: No Hemosiderin Staining: No Scarring: No Mottled: No Pallor: No Moisture Rubor: No No Abnormalities Noted: No Dry / Scaly: No Temperature / Pain Maceration: Yes Temperature: No Abnormality Wound Preparation Ulcer Cleansing: Rinsed/Irrigated with Saline Topical Anesthetic Applied: Other: lidocaine 4%, Treatment Notes Wound #1 (Right, Lateral Lower Leg) 1. Cleansed with: Clean wound with Normal Saline 2. Anesthetic Topical Lidocaine 4% cream to wound bed prior to debridement 4. Dressing Applied: Other dressing (specify in notes) Notes endoform secured with BFD Electronic Signature(s) Signed: 01/21/2018 1:11:45 PM By: Secundino Ginger Entered By: Secundino Ginger on 01/21/2018 12:56:01 Raymond Barnett ([id number]) -------------------------------------------------------------------------------- [place] Details Patient Name: Raymond Barnett Date of Service: 01/21/2018 12:45 PM Medical Record Number: [id number] Patient Account Number: [id number] Date of Birth/Sex: 1929/07/31 (84 y.o. M) Treating RN: Cornell Barman Primary Care Laetitia Schnepf: Nobie Putnam Other Clinician: Referring Kirstine Jacquin: Nobie Putnam Treating George Alcantar/Extender: Tito Dine in Treatment: 4 Vital Signs Time Taken: 12:43 Temperature (F): 97.7 Height (in): 70 Pulse (bpm): 70 Weight (lbs): 211 Respiratory Rate  (breaths/min): 16 Body Mass Index (BMI): 30.3 Blood Pressure (mmHg): 136/56 Reference Range: 80 - 120 mg / dl Electronic Signature(s) Signed: 01/21/2018 1:31:10 PM By:  Becky Sax, Sallie RCP, RRT, CHT Entered By: Lorine Bears on 01/21/2018 12:46:23

## 2018-02-04 ENCOUNTER — Encounter: Payer: Medicare Other | Admitting: Internal Medicine

## 2018-02-04 DIAGNOSIS — L97312 Non-pressure chronic ulcer of right ankle with fat layer exposed: Secondary | ICD-10-CM | POA: Diagnosis not present

## 2018-02-04 DIAGNOSIS — L97311 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 of skin: Secondary | ICD-10-CM | POA: Diagnosis not present

## 2018-02-04 DIAGNOSIS — I872 Venous insufficiency (chronic) (peripheral): Secondary | ICD-10-CM | POA: Diagnosis not present

## 2018-02-04 DIAGNOSIS — M069 Rheumatoid arthritis, unspecified: Secondary | ICD-10-CM | POA: Diagnosis not present

## 2018-02-04 DIAGNOSIS — I10 Essential (primary) hypertension: Secondary | ICD-10-CM | POA: Diagnosis not present

## 2018-02-04 DIAGNOSIS — I70233 Atherosclerosis of native arteries of right leg with ulceration of ankle: Secondary | ICD-10-CM | POA: Diagnosis not present

## 2018-02-06 NOTE — Progress Notes (Signed)
Raymond, Barnett ([id number]) Visit Report for 02/04/2018 Arrival Information Details Patient Name: Raymond, Barnett Date of Service: 02/04/2018 12:45 PM Medical Record Number: [id number] Patient Account Number: [id number] Date of Birth/Sex: [birth year] (84 y.o. M) Treating RN: Raymond Barnett Primary Care Raymond Barnett: Raymond Barnett Other Clinician: Referring Raymond Barnett: Raymond Barnett Treating Raymond Barnett/Extender: Raymond Barnett in Treatment: 6 Visit Information History Since Last Visit Added or deleted any medications: No Patient Arrived: Ambulatory Any new allergies or adverse reactions: No Arrival Time: 12:42 Had a fall or experienced change in No Accompanied By: wife activities of daily living that may affect Transfer Assistance: None risk of falls: Patient Identification Verified: Yes Signs or symptoms of abuse/neglect since last visito No Secondary Verification Process Completed: Yes Hospitalized since last visit: No Implantable device outside of the clinic excluding No cellular tissue based products placed in the center since last visit: Has Dressing in Place as Prescribed: Yes Pain Present Now: No Electronic Signature(s) Signed: 02/04/2018 2:55:18 PM By: Raymond Barnett RCP, RRT, CHT Entered By: Raymond Barnett on 02/04/2018 12:42:48 Raymond Barnett ([id number]) -------------------------------------------------------------------------------- Clinic Level of Care Assessment Details Patient Name: Raymond Barnett Date of Service: 02/04/2018 12:45 PM Medical Record Number: [id number] Patient Account Number: [id number] Date of Birth/Sex: [birth year] (84 y.o. M) Treating RN: Raymond Barnett Primary Care Raymond Barnett: Raymond Barnett Other Clinician: Referring Raymond Barnett: Raymond Barnett Treating Raymond Barnett/Extender: Raymond Barnett in Treatment: 6 Clinic Level of Care Assessment Items TOOL 4 Quantity Score []  - Use when  only an EandM is performed on FOLLOW-UP visit 0 ASSESSMENTS - Nursing Assessment / Reassessment []  - Reassessment of Co-morbidities (includes updates in patient status) 0 X- 1 5 Reassessment of Adherence to Treatment Plan ASSESSMENTS - Wound and Skin Assessment / Reassessment X - Simple Wound Assessment / Reassessment - one wound 1 5 []  - 0 Complex Wound Assessment / Reassessment - multiple wounds []  - 0 Dermatologic / Skin Assessment (not related to wound area) ASSESSMENTS - Focused Assessment []  - Circumferential Edema Measurements - multi extremities 0 []  - 0 Nutritional Assessment / Counseling / Intervention []  - 0 Lower Extremity Assessment (monofilament, tuning fork, pulses) []  - 0 Peripheral Arterial Disease Assessment (using hand held doppler) ASSESSMENTS - Ostomy and/or Continence Assessment and Care []  - Incontinence Assessment and Management 0 []  - 0 Ostomy Care Assessment and Management (repouching, etc.) PROCESS - Coordination of Care X - Simple Patient / Family Education for ongoing care 1 15 []  - 0 Complex (extensive) Patient / Family Education for ongoing care []  - 0 Staff obtains Programmer, systems, Records, Test Results / Process Orders []  - 0 Staff telephones HHA, Nursing Homes / Clarify orders / etc []  - 0 Routine Transfer to another Facility (non-emergent condition) []  - 0 Routine Hospital Admission (non-emergent condition) []  - 0 New Admissions / Biomedical engineer / Ordering NPWT, Apligraf, etc. []  - 0 Emergency Hospital Admission (emergent condition) X- 1 10 Simple Discharge Coordination Raymond, Barnett ([id number]) []  - 0 Complex (extensive) Discharge Coordination PROCESS - Special Needs []  - Pediatric / Minor Patient Management 0 []  - 0 Isolation Patient Management []  - 0 Hearing / Language / Visual special needs []  - 0 Assessment of Community assistance (transportation, D/C planning, etc.) []  - 0 Additional assistance / Altered mentation []   - 0 Support Surface(s) Assessment (bed, cushion, seat, etc.) INTERVENTIONS - Wound Cleansing / Measurement X - Simple Wound Cleansing - one wound 1 5 []  - 0 Complex Wound Cleansing - multiple wounds X-  1 5 Wound Imaging (photographs - any number of wounds) []  - 0 Wound Tracing (instead of photographs) X- 1 5 Simple Wound Measurement - one wound []  - 0 Complex Wound Measurement - multiple wounds INTERVENTIONS - Wound Dressings []  - Small Wound Dressing one or multiple wounds 0 X- 1 15 Medium Wound Dressing one or multiple wounds []  - 0 Large Wound Dressing one or multiple wounds []  - 0 Application of Medications - topical []  - 0 Application of Medications - injection INTERVENTIONS - Miscellaneous []  - External ear exam 0 []  - 0 Specimen Collection (cultures, biopsies, blood, body fluids, etc.) []  - 0 Specimen(s) / Culture(s) sent or taken to Lab for analysis []  - 0 Patient Transfer (multiple staff / Civil Service fast streamer / Similar devices) []  - 0 Simple Staple / Suture removal (25 or less) []  - 0 Complex Staple / Suture removal (26 or more) []  - 0 Hypo / Hyperglycemic Management (close monitor of Blood Glucose) []  - 0 Ankle / Brachial Index (ABI) - do not check if billed separately X- 1 5 Vital Signs Raymond Barnett ([id number]) Has the patient been seen at the hospital within the last three years: Yes Total Score: 70 Level Of Care: New/Established - Level 2 Electronic Signature(s) Signed: 02/04/2018 5:17:02 PM By: Raymond Barnett, BSN, RN, CWS, Kim RN, BSN Entered By: Raymond Barnett, BSN, RN, CWS, Raymond Barnett on 02/04/2018 13:09:21 Raymond Barnett ([id number]) -------------------------------------------------------------------------------- Lower Extremity Assessment Details Patient Name: Raymond Barnett Date of Service: 02/04/2018 12:45 PM Medical Record Number: [id number] Patient Account Number: [id number] Date of Birth/Sex: 1929/09/05 (84 y.o. M) Treating RN: Raymond Barnett Primary Care Raymond Barnett:  Raymond Barnett Other Clinician: Referring Raymond Barnett: Raymond Barnett Treating Raymond Barnett/Extender: Raymond Barnett in Treatment: 6 Edema Assessment Assessed: [Left: No] [Right: No] Edema: [Left: N] [Right: o] Calf Left: Right: Point of Measurement: 37 cm From Medial Instep cm 33 cm Ankle Left: Right: Point of Measurement: 14 cm From Medial Instep cm 24 cm Vascular Assessment Claudication: Claudication Assessment [Right:None] Pulses: Dorsalis Pedis Palpable: [Right:Yes] Posterior Tibial Extremity colors, hair growth, and conditions: Extremity Color: [Right:Normal] Hair Growth on Extremity: [Right:Yes] Temperature of Extremity: [Right:Barnett] Capillary Refill: [Right:< 3 seconds] Toe Nail Assessment Left: Right: Thick: No Discolored: No Deformed: No Electronic Signature(s) Signed: 02/04/2018 4:03:08 PM By: Raymond Barnett Entered By: Raymond Barnett on 02/04/2018 12:56:33 Raymond Barnett ([id number]) -------------------------------------------------------------------------------- Multi Wound Chart Details Patient Name: Raymond Barnett Date of Service: 02/04/2018 12:45 PM Medical Record Number: [id number] Patient Account Number: [id number] Date of Birth/Sex: Nov 27, 1929 (84 y.o. M) Treating RN: Raymond Barnett Primary Care Menachem Urbanek: Raymond Barnett Other Clinician: Referring Axelle Szwed: Raymond Barnett Treating Hailynn Slovacek/Extender: Raymond Barnett in Treatment: 6 Vital Signs Height(in): 70 Pulse(bpm): 85 Weight(lbs): 211 Blood Pressure(mmHg): 133/68 Body Mass Index(BMI): 30 Temperature(F): 97.8 Respiratory Rate 16 (breaths/min): Photos: [N/A:N/A] Wound Location: Right Lower Leg - Lateral N/A N/A Wounding Event: Other Lesion N/A N/A Primary Etiology: Venous Leg Ulcer N/A N/A Comorbid History: Glaucoma, Hypertension, N/A N/A Peripheral Venous Disease, Rheumatoid Arthritis Date Acquired: 11/20/2017 N/A N/A Weeks of Treatment: 6 N/A N/A Wound  Status: Open N/A N/A Measurements L x W x D 2x1.5x0.2 N/A N/A (cm) Area (cm) : 2.356 N/A N/A Volume (cm) : 0.471 N/A N/A % Reduction in Area: -275.20% N/A N/A % Reduction in Volume: -273.80% N/A N/A Classification: Full Thickness Without N/A N/A Exposed Support Structures Exudate Amount: Small N/A N/A Exudate Type: Purulent N/A N/A Exudate Color: yellow, brown, green N/A N/A Wound Margin: Distinct, outline attached  N/A N/A Granulation Amount: Small (1-33%) N/A N/A Granulation Quality: Pink N/A N/A Necrotic Amount: Large (67-100%) N/A N/A Exposed Structures: Fat Layer (Subcutaneous N/A N/A Tissue) Exposed: Yes Fascia: No Tendon: No Muscle: No BRADDEN, TADROS ([id number]) Joint: No Bone: No Epithelialization: None N/A N/A Debridement: Debridement - Excisional N/A N/A Pre-procedure 13:03 N/A N/A Verification/Time Out Taken: Pain Control: Lidocaine 4% Topical Solution N/A N/A Tissue Debrided: Subcutaneous N/A N/A Level: Skin/Subcutaneous Tissue N/A N/A Debridement Area (sq cm): 3 N/A N/A Instrument: Curette N/A N/A Bleeding: None N/A N/A Procedural Pain: 0 N/A N/A Post Procedural Pain: 0 N/A N/A Debridement Treatment Procedure was tolerated well N/A N/A Response: Post Debridement 2x1.5x0.2 N/A N/A Measurements L x W x D (cm) Post Debridement Volume: 0.471 N/A N/A (cm) Periwound Skin Texture: Excoriation: No N/A N/A Induration: No Callus: No Crepitus: No Rash: No Scarring: No Periwound Skin Moisture: Maceration: Yes N/A N/A Dry/Scaly: No Periwound Skin Color: Atrophie Blanche: No N/A N/A Cyanosis: No Ecchymosis: No Erythema: No Hemosiderin Staining: No Mottled: No Pallor: No Rubor: No Temperature: No Abnormality N/A N/A Tenderness on Palpation: No N/A N/A Wound Preparation: Ulcer Cleansing: N/A N/A Rinsed/Irrigated with Saline Topical Anesthetic Applied: Other: lidocaine 4% Procedures Performed: Debridement N/A N/A Treatment Notes Electronic  Signature(s) Signed: 02/04/2018 4:50:13 PM By: Linton Ham MD Entered By: Linton Ham on 02/04/2018 13:10:05 Raymond Barnett ([id number]) -------------------------------------------------------------------------------- Multi-Disciplinary Care Plan Details Patient Name: Raymond Barnett Date of Service: 02/04/2018 12:45 PM Medical Record Number: [id number] Patient Account Number: [id number] Date of Birth/Sex: 18-Jan-1930 (84 y.o. M) Treating RN: Raymond Barnett Primary Care Lalana Wachter: Raymond Barnett Other Clinician: Referring Embree Brawley: Raymond Barnett Treating Cletus Mehlhoff/Extender: Raymond Barnett in Treatment: 6 Active Inactive ` Orientation to the Wound Care Program Nursing Diagnoses: Knowledge deficit related to the wound healing center program Goals: Patient/caregiver will verbalize understanding of the [facility] Program Date Initiated: 12/24/2017 Target Resolution Date: 01/24/2018 Goal Status: Active Interventions: Provide education on orientation to the wound center Notes: ` Wound/Skin Impairment Nursing Diagnoses: Impaired tissue integrity Knowledge deficit related to ulceration/compromised skin integrity Goals: Ulcer/skin breakdown will have a volume reduction of 30% by week 4 Date Initiated: 12/24/2017 Target Resolution Date: 01/24/2018 Goal Status: Active Interventions: Provide education on ulcer and skin care Treatment Activities: Topical wound management initiated : 12/24/2017 Notes: Electronic Signature(s) Signed: 02/04/2018 5:17:02 PM By: Raymond Barnett, BSN, RN, CWS, Kim RN, BSN Entered By: Raymond Barnett, BSN, RN, CWS, Raymond Barnett on 02/04/2018 13:02:57 Raymond Barnett ([id number]) -------------------------------------------------------------------------------- Pain Assessment Details Patient Name: Raymond Barnett Date of Service: 02/04/2018 12:45 PM Medical Record Number: [id number] Patient Account Number: [id number] Date of Birth/Sex: [birth year] (84  y.o. M) Treating RN: Raymond Barnett Primary Care Halina Asano: Raymond Barnett Other Clinician: Referring Dontrel Smethers: Raymond Barnett Treating Ezella Kell/Extender: Raymond Barnett in Treatment: 6 Active Problems Location of Pain Severity and Description of Pain Patient Has Paino No Site Locations Pain Management and Medication Current Pain Management: Electronic Signature(s) Signed: 02/04/2018 2:55:18 PM By: Raymond Barnett RCP, RRT, CHT Signed: 02/04/2018 5:17:02 PM By: Raymond Barnett, BSN, RN, CWS, Kim RN, BSN Entered By: Raymond Barnett on 02/04/2018 12:42:55 Raymond Barnett ([id number]) -------------------------------------------------------------------------------- Patient/Caregiver Education Details Patient Name: Raymond Barnett Date of Service: 02/04/2018 12:45 PM Medical Record Number: [id number] Patient Account Number: [id number] Date of Birth/Gender: 1929/08/30 (84 y.o. M) Treating RN: Raymond Barnett Primary Care Physician: Raymond Barnett Other Clinician: Referring Physician: Nobie Barnett Treating Physician/Extender: Raymond Barnett in Treatment: 6 Education Assessment Education Provided To: Patient  Education Topics Provided Wound/Skin Impairment: Handouts: Caring for Your Ulcer Methods: Demonstration, Explain/Verbal Responses: State content correctly Electronic Signature(s) Signed: 02/04/2018 5:17:02 PM By: Raymond Barnett, BSN, RN, CWS, Kim RN, BSN Entered By: Raymond Barnett, BSN, RN, CWS, Raymond Barnett on 02/04/2018 13:10:03 Raymond Barnett ([id number]) -------------------------------------------------------------------------------- Wound Assessment Details Patient Name: Raymond Barnett Date of Service: 02/04/2018 12:45 PM Medical Record Number: [id number] Patient Account Number: [id number] Date of Birth/Sex: Jul 11, 1929 (84 y.o. M) Treating RN: Raymond Barnett Primary Care Rolla Servidio: Raymond Barnett Other Clinician: Referring Lety Cullens:  Raymond Barnett Treating Dennys Traughber/Extender: Raymond Barnett in Treatment: 6 Wound Status Wound Number: 1 Primary Venous Leg Ulcer Etiology: Wound Location: Right Lower Leg - Lateral Wound Open Wounding Event: Other Lesion Status: Date Acquired: 11/20/2017 Comorbid Glaucoma, Hypertension, Peripheral Venous Weeks Of Treatment: 6 History: Disease, Rheumatoid Arthritis Clustered Wound: No Photos Wound Measurements Length: (cm) 2 Width: (cm) 1.5 Depth: (cm) 0.2 Area: (cm) 2.356 Volume: (cm) 0.471 % Reduction in Area: -275.2% % Reduction in Volume: -273.8% Epithelialization: None Tunneling: No Undermining: No Wound Description Full Thickness Without Exposed Support Foul Odo Classification: Structures Slough/F Wound Margin: Distinct, outline attached Exudate Medium Amount: Exudate Type: Serous Exudate Color: amber r After Cleansing: No ibrino Yes Wound Bed Granulation Amount: Small (1-33%) Exposed Structure Granulation Quality: Pink Fascia Exposed: No Necrotic Amount: Large (67-100%) Fat Layer (Subcutaneous Tissue) Exposed: Yes Necrotic Quality: Adherent Slough Tendon Exposed: No Muscle Exposed: No Joint Exposed: No Bone Exposed: No Periwound Skin Texture Raymond, Barnett ([id number]) Texture Color No Abnormalities Noted: No No Abnormalities Noted: No Callus: No Atrophie Blanche: No Crepitus: No Cyanosis: No Excoriation: No Ecchymosis: No Induration: No Erythema: No Rash: No Hemosiderin Staining: No Scarring: No Mottled: No Pallor: No Moisture Rubor: No No Abnormalities Noted: No Dry / Scaly: No Temperature / Pain Maceration: Yes Temperature: No Abnormality Wound Preparation Ulcer Cleansing: Rinsed/Irrigated with Saline Topical Anesthetic Applied: Other: lidocaine 4%, Treatment Notes Wound #1 (Right, Lateral Lower Leg) 1. Cleansed with: Clean wound with Normal Saline 3. Peri-wound Care: Skin Prep 4. Dressing  Applied: Calcium Alginate with Silver 5. Secondary Dressing Applied Bordered Foam Dressing Notes silvercel Electronic Signature(s) Signed: 02/04/2018 4:03:08 PM By: Raymond Barnett Signed: 02/04/2018 5:17:02 PM By: Raymond Barnett, BSN, RN, CWS, Kim RN, BSN Entered By: Raymond Barnett, BSN, RN, CWS, Raymond Barnett on 02/04/2018 13:12:47 Raymond, Barnett ([id number]) -------------------------------------------------------------------------------- [place] Details Patient Name: Raymond Barnett Date of Service: 02/04/2018 12:45 PM Medical Record Number: [id number] Patient Account Number: [id number] Date of Birth/Sex: Nov 04, 1929 (84 y.o. M) Treating RN: Raymond Barnett Primary Care Yani Lal: Raymond Barnett Other Clinician: Referring Cena Bruhn: Raymond Barnett Treating Chania Kochanski/Extender: Raymond Barnett in Treatment: 6 Vital Signs Time Taken: 12:42 Temperature (F): 97.8 Height (in): 70 Pulse (bpm): 74 Weight (lbs): 211 Respiratory Rate (breaths/min): 16 Body Mass Index (BMI): 30.3 Blood Pressure (mmHg): 133/68 Reference Range: 80 - 120 mg / dl Electronic Signature(s) Signed: 02/04/2018 2:55:18 PM By: Raymond Barnett RCP, RRT, CHT Entered By: Raymond Barnett on 02/04/2018 12:46:11

## 2018-02-06 NOTE — Progress Notes (Signed)
ARYON, NHAM ([id number]) Visit Report for 02/04/2018 Debridement Details Patient Name: Raymond Barnett, Raymond Barnett Date of Service: 02/04/2018 12:45 PM Medical Record Number: [id number] Patient Account Number: [id number] Date of Birth/Sex: 23-Mar-1930 (84 y.o. M) Treating RN: Cornell Barman Primary Care Provider: Nobie Putnam Other Clinician: Referring Provider: Nobie Putnam Treating Provider/Extender: Tito Dine in Treatment: 6 Debridement Performed for Wound #1 Right,Lateral Lower Leg Assessment: Performed By: Physician Ricard Dillon, MD Debridement Type: Debridement Severity of Tissue Pre Fat layer exposed Debridement: Level of Consciousness (Pre- Awake and Alert procedure): Pre-procedure Verification/Time Yes - 13:03 Out Taken: Start Time: 13:03 Pain Control: Lidocaine 4% Topical Solution Total Area Debrided (L x W): 2 (cm) x 1.5 (cm) = 3 (cm) Tissue and other material Non-Viable, Subcutaneous debrided: Level: Skin/Subcutaneous Tissue Debridement Description: Excisional Instrument: Curette Bleeding: None End Time: 13:05 Procedural Pain: 0 Post Procedural Pain: 0 Response to Treatment: Procedure was tolerated well Level of Consciousness Awake and Alert (Post-procedure): Post Debridement Measurements of Total Wound Length: (cm) 2 Width: (cm) 1.5 Depth: (cm) 0.2 Volume: (cm) 0.471 Character of Wound/Ulcer Post Debridement: Improved Severity of Tissue Post Debridement: Fat layer exposed Post Procedure Diagnosis Same as Pre-procedure Electronic Signature(s) Signed: 02/04/2018 4:50:13 PM By: Linton Ham MD Signed: 02/04/2018 5:17:02 PM By: Gretta Cool, BSN, RN, CWS, Kim RN, BSN Entered By: Linton Ham on 02/04/2018 13:10:13 Raymond Barnett, Raymond Barnett ([id number]) Raymond Barnett, Raymond Barnett ([id number]) -------------------------------------------------------------------------------- HPI Details Patient Name: Raymond Barnett Date of Service: 02/04/2018  12:45 PM Medical Record Number: [id number] Patient Account Number: [id number] Date of Birth/Sex: March 07, 1930 (84 y.o. M) Treating RN: Cornell Barman Primary Care Provider: Nobie Putnam Other Clinician: Referring Provider: Nobie Putnam Treating Provider/Extender: Tito Dine in Treatment: 6 History of Present Illness Severity: furthermore the TBI was 0.33 on the right HPI Description: ADMISSION 12/24/17 This is an 84 year old man who arrives for review of wound on his right lateral ankle area. He states this happened about a year ago when he picked the scab off the area and the wound opened and never really healed. He has been followed by Dr. Governor Specking at [facility] wound care center. He is apparently used a multitude of topical dressings as well as skin substitutes however I'm not certain at this point with scattered skin substitutes were used. His last visit there was on 10/09/17 he's been using sorbact with hydrogel and a covering dressing. He states the wound is actually gotten better with less depth and wound volume. He is frustrated by the slowness of the healing and he is coming here for review. He also lives in [place] and this is a lot closer for him in terms of traveling. I have not had a chance to look through all of Dr. Samule Ohm notes however the wound was listed as a venous ulcer at one point. He had an x-ray done in December 2018 that did not show evidence of osteomyelitis. According to the patient osteomyelitis was ruled out. The patient actually has a complex history in this area. He suffered a serious fracture in the area perhaps 60 years ago while he was in the [employer] when [employer] over his ankle. He had a multitude of surgeries, bone graft and ultimately an ankle fusion. Apparently all the hardware however has been taken out of the ankle quite a while ago. He does not however have a prior wound history. The patient also saw his  cardiologist in May who appropriately ordered vascular tests. This showed an ABI in the right  of 0.6 on the left of 1.07. This showed monophasic waveforms and all of the tibial vessels. 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 Also at 30-49% stenosis of the distal popliteal. furthermore TBI was 0.33 on the right which is markedly reduced. Looking at Dr. Marisue Humble Notes the patient refused to see an interventionalist 12/31/17; right lateral ankle wound.. He sees Dr. and his cardiologist this afternoon and they will discuss further peripheral artery evaluation. He is using Endoform. 01/07/18; right lateral ankle wound. He saw [name] was his cardiologist. He seems to be of the opinion that the patient has critical limb ischemia as a cause for nonhealing of this area. I certainly agree with that. Nevertheless the patient will not agree to an invasive test i.e. an angiogram. Therefore we are left with attempting wound care. We are using Endoform. His wife changes the dressings 01/21/18; right ankle wound just below the right lateral malleolus. Wound looks about the same. Surface does not look as bad as when he first came in here. There is a small divot a roughly 10:00 but I cannot feel bone. Using Endoform 02/04/18; right ankle wound just at the level of the right lateral malleolus. It would appear the area has expanded somewhat in length. 2 small open areas with the bridge of normal skin. There is no evidence of surrounding infection. He still is very reluctant to undergo any invasive vascular assessment. His ABI on the right was 0.6 with monophasic waveforms in all tibial vessels. He still does not want to see an interventional list Electronic Signature(s) Signed: 02/04/2018 4:50:13 PM By: Linton Ham MD Entered By: Linton Ham on 02/04/2018 13:11:45 Raymond Barnett ([id number]) -------------------------------------------------------------------------------- Physical Exam Details Patient Name:  Raymond Barnett Date of Service: 02/04/2018 12:45 PM Medical Record Number: [id number] Patient Account Number: [id number] Date of Birth/Sex: 07/11/1929 (84 y.o. M) Treating RN: Cornell Barman Primary Care Provider: Nobie Putnam Other Clinician: Referring Provider: Nobie Putnam Treating Provider/Extender: Tito Dine in Treatment: 6 Constitutional Sitting or standing Blood Pressure is within target range for patient.. Pulse regular and within target range for patient.Marland Kitchen Respirations regular, non-labored and within target range.. Temperature is normal and within the target range for the patient.Marland Kitchen appears in no distress. Cardiovascular Pedal pulses absent bilaterally.. Notes Would exam; base of the wound had surface sloughAnd necrotic subcutaneous tissue which I remove #3 curet. Hemostasis with direct pressureThere is a new superior circular area this did not require debridement. The wound bed is expanded superiorly. 2 small open areas with a bridge of normal skin in the middle. Electronic Signature(s) Signed: 02/04/2018 4:50:13 PM By: Linton Ham MD Entered By: Linton Ham on 02/04/2018 13:43:22 Raymond Barnett ([id number]) -------------------------------------------------------------------------------- Physician Orders Details Patient Name: Raymond Barnett Date of Service: 02/04/2018 12:45 PM Medical Record Number: [id number] Patient Account Number: [id number] Date of Birth/Sex: [birth year] (84 y.o. M) Treating RN: Cornell Barman Primary Care Provider: Nobie Putnam Other Clinician: Referring Provider: Nobie Putnam Treating Provider/Extender: Tito Dine in Treatment: 6 Verbal / Phone Orders: No Diagnosis Coding Wound Cleansing Wound #1 Right,Lateral Lower Leg o Clean wound with Normal Saline. Anesthetic (add to Medication List) Wound #1 Right,Lateral Lower Leg o Topical Lidocaine 4% cream applied to wound bed  prior to debridement (In Clinic Only). Primary Wound Dressing Wound #1 Right,Lateral Lower Leg o Silver Alginate Secondary Dressing Wound #1 Right,Lateral Lower Leg o Boardered Foam Dressing Dressing Change Frequency Wound #1 Right,Lateral Lower Leg o Change Dressing Monday, Wednesday, Friday Follow-up  Appointments Wound #1 Right,Lateral Lower Leg o Return Appointment in 1 month Edema Control Wound #1 Right,Lateral Lower Leg o Elevate legs to the level of the heart and pump ankles as often as possible Electronic Signature(s) Signed: 02/04/2018 4:50:13 PM By: Linton Ham MD Signed: 02/04/2018 5:17:02 PM By: Gretta Cool, BSN, RN, CWS, Kim RN, BSN Entered By: Gretta Cool, BSN, RN, CWS, Kim on 02/04/2018 13:08:44 Raymond Barnett ([id number]) -------------------------------------------------------------------------------- Problem List Details Patient Name: Raymond Barnett Date of Service: 02/04/2018 12:45 PM Medical Record Number: [id number] Patient Account Number: [id number] Date of Birth/Sex: [birth year] (84 y.o. M) Treating RN: Cornell Barman Primary Care Provider: Nobie Putnam Other Clinician: Referring Provider: Nobie Putnam Treating Provider/Extender: Tito Dine in Treatment: 6 Active Problems ICD-10 Evaluated Encounter Code Description Active Date Today Diagnosis L97.311 Non-pressure chronic ulcer of right ankle limited to 12/24/2017 No Yes breakdown of skin I70.233 Atherosclerosis of native arteries of right leg with ulceration of 12/24/2017 No Yes ankle Inactive Problems Resolved Problems Electronic Signature(s) Signed: 02/04/2018 4:50:13 PM By: Linton Ham MD Entered By: Linton Ham on 02/04/2018 13:09:56 Raymond Barnett ([id number]) -------------------------------------------------------------------------------- Progress Note Details Patient Name: Raymond Barnett Date of Service: 02/04/2018 12:45 PM Medical Record Number:  [id number] Patient Account Number: [id number] Date of Birth/Sex: [birth year] (84 y.o. M) Treating RN: Cornell Barman Primary Care Provider: Nobie Putnam Other Clinician: Referring Provider: Nobie Putnam Treating Provider/Extender: Tito Dine in Treatment: 6 Subjective History of Present Illness (HPI) The following HPI elements were documented for the patient's wound: Severity: furthermore the TBI was 0.33 on the right ADMISSION 12/24/17 This is an 84 year old man who arrives for review of wound on his right lateral ankle area. He states this happened about a year ago when he picked the scab off the area and the wound opened and never really healed. He has been followed by Dr. Governor Specking at [facility] wound care center. He is apparently used a multitude of topical dressings as well as skin substitutes however I'm not certain at this point with scattered skin substitutes were used. His last visit there was on 10/09/17 he's been using sorbact with hydrogel and a covering dressing. He states the wound is actually gotten better with less depth and wound volume. He is frustrated by the slowness of the healing and he is coming here for review. He also lives in [place] and this is a lot closer for him in terms of traveling. I have not had a chance to look through all of Dr. Samule Ohm notes however the wound was listed as a venous ulcer at one point. He had an x-ray done in December 2018 that did not show evidence of osteomyelitis. According to the patient osteomyelitis was ruled out. The patient actually has a complex history in this area. He suffered a serious fracture in the area perhaps 60 years ago while he was in the [employer] when [employer] over his ankle. He had a multitude of surgeries, bone graft and ultimately an ankle fusion. Apparently all the hardware however has been taken out of the ankle quite a while ago. He does not however have a prior wound  history. The patient also saw his cardiologist in May who appropriately ordered vascular tests. This showed an ABI in the right of 0.6 on the left of 1.07. This showed monophasic waveforms and all of the tibial vessels. 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 Also at 30-49% stenosis of the distal popliteal. furthermore TBI was 0.33 on the right  which is markedly reduced. Looking at Dr. Marisue Humble Notes the patient refused to see an interventionalist 12/31/17; right lateral ankle wound.. He sees Dr. and his cardiologist this afternoon and they will discuss further peripheral artery evaluation. He is using Endoform. 01/07/18; right lateral ankle wound. He saw [name] was his cardiologist. He seems to be of the opinion that the patient has critical limb ischemia as a cause for nonhealing of this area. I certainly agree with that. Nevertheless the patient will not agree to an invasive test i.e. an angiogram. Therefore we are left with attempting wound care. We are using Endoform. His wife changes the dressings 01/21/18; right ankle wound just below the right lateral malleolus. Wound looks about the same. Surface does not look as bad as when he first came in here. There is a small divot a roughly 10:00 but I cannot feel bone. Using Endoform 02/04/18; right ankle wound just at the level of the right lateral malleolus. It would appear the area has expanded somewhat in length. 2 small open areas with the bridge of normal skin. There is no evidence of surrounding infection. He still is very reluctant to undergo any invasive vascular assessment. His ABI on the right was 0.6 with monophasic waveforms in all tibial vessels. He still does not want to see an interventional list Raymond Barnett, Raymond Barnett ([id number]) Objective Constitutional Sitting or standing Blood Pressure is within target range for patient.. Pulse regular and within target range for patient.Marland Kitchen Respirations regular, non-labored and within target range..  Temperature is normal and within the target range for the patient.Marland Kitchen appears in no distress. Vitals Time Taken: 12:42 PM, Height: 70 in, Weight: 211 lbs, BMI: 30.3, Temperature: 97.8 F, Pulse: 74 bpm, Respiratory Rate: 16 breaths/min, Blood Pressure: 133/68 mmHg. Cardiovascular Pedal pulses absent bilaterally.. General Notes: Would exam; base of the wound had surface sloughAnd necrotic subcutaneous tissue which I remove #3 curet. Hemostasis with direct pressureThere is a new superior circular area this did not require debridement. The wound bed is expanded superiorly. 2 small open areas with a bridge of normal skin in the middle. Integumentary (Hair, Skin) Wound #1 status is Open. Original cause of wound was Other Lesion. The wound is located on the Right,Lateral Lower Leg. The wound measures 2cm length x 1.5cm width x 0.2cm depth; 2.356cm^2 area and 0.471cm^3 volume. There is Fat Layer (Subcutaneous Tissue) Exposed exposed. There is no tunneling or undermining noted. There is a medium amount of serous drainage noted. The wound margin is distinct with the outline attached to the wound base. There is small (1-33%) pink granulation within the wound bed. There is a large (67-100%) amount of necrotic tissue within the wound bed including Adherent Slough. The periwound skin appearance exhibited: Maceration. The periwound skin appearance did not exhibit: Callus, Crepitus, Excoriation, Induration, Rash, Scarring, Dry/Scaly, Atrophie Blanche, Cyanosis, Ecchymosis, Hemosiderin Staining, Mottled, Pallor, Rubor, Erythema. Periwound temperature was noted as No Abnormality. 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 There was a Excisional Skin/Subcutaneous Tissue Debridement with a total area of 3 sq cm performed by Ricard Dillon, MD. With the following instrument(s): Curette to remove Non-Viable tissue/material. Material removed includes Subcutaneous Tissue after achieving pain control using Lidocaine 4% Topical Solution.  No specimens were taken. A time out was conducted at 13:03, prior to the start of the procedure. There was no bleeding. The procedure was tolerated well with a pain level of 0 throughout and a pain level of 0 following the procedure. Post Debridement Measurements: 2cm length x 1.5cm width x 0.2cm depth; 0.471cm^3 volume. Character of Wound/Ulcer Post Debridement is improved. Severity of Tissue Post Debridement is: Fat layer exposed. Post procedure Diagnosis Wound #1: Same as Pre-Procedure Raymond Barnett, Raymond Barnett ([id number]) Plan Wound Cleansing: Wound #1 Right,Lateral Lower Leg: Clean wound with Normal Saline. Anesthetic (add to Medication List): Wound #1 Right,Lateral Lower Leg: Topical Lidocaine 4% cream applied to wound bed prior to debridement (In Clinic Only). Primary Wound Dressing: Wound #1 Right,Lateral Lower Leg: Silver Alginate Secondary Dressing: Wound #1 Right,Lateral Lower Leg: Boardered Foam Dressing Dressing Change Frequency: Wound #1 Right,Lateral Lower Leg: Change Dressing Monday, Wednesday, Friday Follow-up Appointments: Wound #1 Right,Lateral Lower Leg: Return Appointment in 1 month Edema Control: Wound #1 Right,Lateral Lower Leg: Elevate legs to the level of the heart and pump ankles as often as possible #1 I had a discussion with the patient about how to approach this. Without more vascular supply it is difficult to justify using advanced treatment options on this area. In fact he had several advanced treatment options I think while at [facility] or at least to although I can't really identify which ones were done. The patient is very reluctant  to consider any invasive tests #2 this is not infected #3 after discussion with the patient's wife I went to a palliative approach to this. We will use over alginate and border foam change this every 2-3 days Electronic Signature(s) Signed: 02/04/2018 1:44:02 PM By: Linton Ham MD Entered By: Linton Ham on 02/04/2018 13:44:02 Raymond Barnett ([id number]) -------------------------------------------------------------------------------- SuperBill Details Patient Name: Raymond Barnett Date of Service: 02/04/2018 Medical Record Number: [id number] Patient Account Number: [id number] Date of Birth/Sex: 05-13-1929 (84 y.o. 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 Facility Procedures CPT4 Code: 06301601 Description: [id number] - WOUND CARE VISIT-LEV 2 EST PT Modifier: Quantity: 1 CPT4 Code: 55732202 Description: 54270 - DEB SUBQ TISSUE 20 SQ CM/< ICD-10 Diagnosis Description W23.762 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 I70.233 Atherosclerosis of native arteries of right leg with ulceratio Modifier: of skin n of ankle Quantity: 1 Physician Procedures CPT4 Code: [id number] Description: 16073 - WC PHYS SUBQ TISS 20 SQ CM ICD-10 Diagnosis Description X10.626 Non-pressure chronic ulcer of right ankle limited to breakdow I70.233 Atherosclerosis of native arteries of right leg with ulcerati Modifier: n of skin on of ankle Quantity: 1 Electronic Signature(s) Signed: 02/04/2018 4:50:13 PM By: Linton Ham MD Entered By: Linton Ham on 02/04/2018 13:44:29

## 2018-03-04 ENCOUNTER — Encounter: Payer: Medicare Other | Attending: Internal Medicine | Admitting: Internal Medicine

## 2018-03-04 DIAGNOSIS — L97312 Non-pressure chronic ulcer of right ankle with fat layer exposed: Secondary | ICD-10-CM | POA: Diagnosis not present

## 2018-03-04 DIAGNOSIS — L97311 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 of skin: Secondary | ICD-10-CM | POA: Insufficient documentation

## 2018-03-04 DIAGNOSIS — I70233 Atherosclerosis of native arteries of right leg with ulceration of ankle: Secondary | ICD-10-CM | POA: Insufficient documentation

## 2018-03-04 DIAGNOSIS — I872 Venous insufficiency (chronic) (peripheral): Secondary | ICD-10-CM | POA: Diagnosis not present

## 2018-03-06 NOTE — Progress Notes (Signed)
Raymond Barnett ([id number]) Visit Report for 03/04/2018 Arrival Information Details Patient Name: Raymond Barnett, Raymond Barnett Date of Service: 03/04/2018 12:45 PM Medical Record Number: [id number] Patient Account Number: [id number] Date of Birth/Sex: 1929/05/08 (84 y.o. M) Treating RN: Secundino Ginger Primary Care Kelcey Korus: Nobie Putnam Other Clinician: Referring Gurjot Brisco: Nobie Putnam Treating Esmae Donathan/Extender: Tito Dine in Treatment: 10 Visit Information History Since Last Visit Added or deleted any medications: No Patient Arrived: Cane Any new allergies or adverse reactions: No Arrival Time: 12:56 Had a fall or experienced change in No Accompanied By: wife activities of daily living that may affect Transfer Assistance: None risk of falls: Patient Identification Verified: Yes Signs or symptoms of abuse/neglect since last visito No Secondary Verification Process Completed: Yes Hospitalized since last visit: No Implantable device outside of the clinic excluding No cellular tissue based products placed in the center since last visit: Has Dressing in Place as Prescribed: Yes Pain Present Now: No Electronic Signature(s) Signed: 03/04/2018 4:11:31 PM By: Secundino Ginger Entered By: Secundino Ginger on 03/04/2018 12:57:23 Raymond Barnett ([id number]) -------------------------------------------------------------------------------- Encounter Discharge Information Details Patient Name: Raymond Barnett Date of Service: 03/04/2018 12:45 PM Medical Record Number: [id number] Patient Account Number: [id number] Date of Birth/Sex: 11-Jan-1930 (84 y.o. M) Treating RN: Secundino Ginger Primary Care Arless Vineyard: Nobie Putnam Other Clinician: Referring Thurma Priego: Nobie Putnam Treating Janell Keeling/Extender: Tito Dine in Treatment: 10 Encounter Discharge Information Items Post Procedure Vitals Discharge Condition: Stable Temperature (F): 97.6 Ambulatory Status:  Ambulatory Pulse (bpm): 85 Discharge Destination: Home Respiratory Rate (breaths/min): 16 Transportation: Private Auto Blood Pressure (mmHg): 141/78 Accompanied By: spouse Schedule Follow-up Appointment: Yes Clinical Summary of Care: Electronic Signature(s) Signed: 03/04/2018 4:11:31 PM By: Secundino Ginger Entered By: Secundino Ginger on 03/04/2018 13:26:30 Raymond Barnett ([id number]) -------------------------------------------------------------------------------- Lower Extremity Assessment Details Patient Name: Raymond Barnett Date of Service: 03/04/2018 12:45 PM Medical Record Number: [id number] Patient Account Number: [id number] Date of Birth/Sex: April 06, 1930 (84 y.o. M) Treating RN: Secundino Ginger Primary Care Kionna Brier: Nobie Putnam Other Clinician: Referring Chastidy Ranker: Nobie Putnam Treating Kella Splinter/Extender: Tito Dine in Treatment: 10 Edema Assessment Assessed: [Left: No] [Right: No] [Left: Edema] [Right: :] Calf Left: Right: Point of Measurement: 37 cm From Medial Instep cm 32 cm Ankle Left: Right: Point of Measurement: 14 cm From Medial Instep cm 23 cm Vascular Assessment Claudication: Claudication Assessment [Right:None] Pulses: Dorsalis Pedis Palpable: [Right:Yes] Posterior Tibial Extremity colors, hair growth, and conditions: Extremity Color: [Right:Normal] Hair Growth on Extremity: [Right:No] Temperature of Extremity: [Right:Warm] Capillary Refill: [Right:< 3 seconds] Toe Nail Assessment Left: Right: Thick: Yes Discolored: Yes Deformed: Yes Improper Length and Hygiene: No Electronic Signature(s) Signed: 03/04/2018 4:11:31 PM By: Secundino Ginger Entered By: Secundino Ginger on 03/04/2018 13:07:10 Raymond Barnett ([id number]) -------------------------------------------------------------------------------- Multi Wound Chart Details Patient Name: Raymond Barnett Date of Service: 03/04/2018 12:45 PM Medical Record Number: [id number] Patient Account  Number: [id number] Date of Birth/Sex: 01-17-1930 (84 y.o. M) Treating RN: Secundino Ginger Primary Care Zayyan Mullen: Nobie Putnam Other Clinician: Referring Sidra Oldfield: Nobie Putnam Treating Everson Mott/Extender: Tito Dine in Treatment: 10 Vital Signs Height(in): 70 Pulse(bpm): 85 Weight(lbs): 211 Blood Pressure(mmHg): 141/78 Body Mass Index(BMI): 30 Temperature(F): 97.6 Respiratory Rate 16 (breaths/min): Photos: [1:No Photos] [N/A:N/A] Wound Location: [1:Right Lower Leg - Lateral] [N/A:N/A] Wounding Event: [1:Other Lesion] [N/A:N/A] Primary Etiology: [1:Venous Leg Ulcer] [N/A:N/A] Comorbid History: [1:Glaucoma, Hypertension, Peripheral Venous Disease, Rheumatoid Arthritis] [N/A:N/A] Date Acquired: [1:11/20/2017] [N/A:N/A] Weeks of Treatment: [1:10] [N/A:N/A] Wound Status: [1:Open] [N/A:N/A] Measurements L x W x D [1:1x0.9x0.3] [N/A:N/A] (  cm) Area (cm) : [1:0.707] [N/A:N/A] Volume (cm) : [1:0.212] [N/A:N/A] % Reduction in Area: [1:-12.60%] [N/A:N/A] % Reduction in Volume: [1:-68.30%] [N/A:N/A] Classification: [1:Full Thickness Without Exposed Support Structures] [N/A:N/A] Exudate Amount: [1:Small] [N/A:N/A] Exudate Type: [1:Serous] [N/A:N/A] Exudate Color: [1:amber] [N/A:N/A] Wound Margin: [1:Distinct, outline attached] [N/A:N/A] Granulation Amount: [1:None Present (0%)] [N/A:N/A] Necrotic Amount: [1:Large (67-100%)] [N/A:N/A] Exposed Structures: [1:Fat Layer (Subcutaneous Tissue) Exposed: Yes Fascia: No Tendon: No Muscle: No Joint: No Bone: No] [N/A:N/A] Epithelialization: [1:None] [N/A:N/A] Periwound Skin Texture: [1:Excoriation: No Induration: No Callus: No Crepitus: No] [N/A:N/A] Rash: No Scarring: No Periwound Skin Moisture: Maceration: Yes N/A N/A Dry/Scaly: No Periwound Skin Color: Atrophie Blanche: No N/A N/A Cyanosis: No Ecchymosis: No Erythema: No Hemosiderin Staining: No Mottled: No Pallor: No Rubor: No Temperature: No  Abnormality N/A N/A Tenderness on Palpation: No N/A N/A Wound Preparation: Ulcer Cleansing: N/A N/A Rinsed/Irrigated with Saline Topical Anesthetic Applied: Other: lidocaine 4% Treatment Notes Electronic Signature(s) Signed: 03/04/2018 4:11:31 PM By: Secundino Ginger Entered By: Secundino Ginger on 03/04/2018 13:22:50 Raymond Barnett ([id number]) -------------------------------------------------------------------------------- Multi-Disciplinary Care Plan Details Patient Name: Raymond Barnett Date of Service: 03/04/2018 12:45 PM Medical Record Number: [id number] Patient Account Number: [id number] Date of Birth/Sex: [birth year] (84 y.o. M) Treating RN: Secundino Ginger Primary Care Leocadio Heal: Nobie Putnam Other Clinician: Referring Rodert Hinch: Nobie Putnam Treating Epimenio Schetter/Extender: Tito Dine in Treatment: 10 Active Inactive ` Orientation to the Wound Care Program Nursing Diagnoses: Knowledge deficit related to the wound healing center program Goals: Patient/caregiver will verbalize understanding of the [place] Program Date Initiated: 12/24/2017 Target Resolution Date: 01/24/2018 Goal Status: Active Interventions: Provide education on orientation to the wound center Notes: ` Wound/Skin Impairment Nursing Diagnoses: Impaired tissue integrity Knowledge deficit related to ulceration/compromised skin integrity Goals: Ulcer/skin breakdown will have a volume reduction of 30% by week 4 Date Initiated: 12/24/2017 Target Resolution Date: 01/24/2018 Goal Status: Active Interventions: Provide education on ulcer and skin care Treatment Activities: Topical wound management initiated : 12/24/2017 Notes: Electronic Signature(s) Signed: 03/04/2018 4:11:31 PM By: Secundino Ginger Entered By: Secundino Ginger on 03/04/2018 13:22:43 Raymond Barnett ([id number]) -------------------------------------------------------------------------------- Pain Assessment Details Patient Name:  Raymond Barnett Date of Service: 03/04/2018 12:45 PM Medical Record Number: [id number] Patient Account Number: [id number] Date of Birth/Sex: 03-12-1930 (84 y.o. M) Treating RN: Secundino Ginger Primary Care Lauretta Sallas: Nobie Putnam Other Clinician: Referring Louvinia Cumbo: Nobie Putnam Treating Ranbir Chew/Extender: Tito Dine in Treatment: 10 Active Problems Location of Pain Severity and Description of Pain Patient Has Paino No Site Locations Pain Management and Medication Current Pain Management: Goals for Pain Management pt denies pain to wound site at this time. Electronic Signature(s) Signed: 03/04/2018 4:11:31 PM By: Secundino Ginger Entered By: Secundino Ginger on 03/04/2018 12:58:12 Raymond Barnett ([id number]) -------------------------------------------------------------------------------- Patient/Caregiver Education Details Patient Name: Raymond Barnett Date of Service: 03/04/2018 12:45 PM Medical Record Number: [id number] Patient Account Number: [id number] Date of Birth/Gender: Oct 21, 1929 (84 y.o. M) Treating RN: Secundino Ginger Primary Care Physician: Nobie Putnam Other Clinician: Referring Physician: Nobie Putnam Treating Physician/Extender: Tito Dine in Treatment: 10 Education Assessment Education Provided To: Patient Education Topics Provided Wound/Skin Impairment: Handouts: Caring for Your Ulcer Methods: Demonstration, Explain/Verbal Responses: State content correctly Electronic Signature(s) Signed: 03/04/2018 4:11:31 PM By: Secundino Ginger Entered By: Secundino Ginger on 03/04/2018 13:25:35 Raymond Barnett ([id number]) -------------------------------------------------------------------------------- Wound Assessment Details Patient Name: Raymond Barnett Date of Service: 03/04/2018 12:45 PM Medical Record Number: [id number] Patient Account Number: [id number] Date of Birth/Sex: 11/11/1929 (84 y.o. M) Treating RN:  Secundino Ginger Primary Care  Lilu Mcglown: Nobie Putnam Other Clinician: Referring Zarie Kosiba: Nobie Putnam Treating Shauna Bodkins/Extender: Tito Dine in Treatment: 10 Wound Status Wound Number: 1 Primary Venous Leg Ulcer Etiology: Wound Location: Right Lower Leg - Lateral Wound Open Wounding Event: Other Lesion Status: Date Acquired: 11/20/2017 Comorbid Glaucoma, Hypertension, Peripheral Venous Weeks Of Treatment: 10 History: Disease, Rheumatoid Arthritis Clustered Wound: No Photos Photo Uploaded By: Secundino Ginger on 03/04/2018 13:28:58 Wound Measurements Length: (cm) 1 Width: (cm) 0.9 Depth: (cm) 0.3 Area: (cm) 0.707 Volume: (cm) 0.212 % Reduction in Area: -12.6% % Reduction in Volume: -68.3% Epithelialization: None Tunneling: No Undermining: No Wound Description Full Thickness Without Exposed Support Foul Odo Classification: Structures Slough/F Wound Margin: Distinct, outline attached Exudate Small Amount: Exudate Type: Serous Exudate Color: amber r After Cleansing: No ibrino Yes Wound Bed Granulation Amount: None Present (0%) Exposed Structure Necrotic Amount: Large (67-100%) Fascia Exposed: No Necrotic Quality: Adherent Slough Fat Layer (Subcutaneous Tissue) Exposed: Yes Tendon Exposed: No Muscle Exposed: No Joint Exposed: No Bone Exposed: No CASTLE, LAMONS. ([id number]) Periwound Skin Texture Texture Color No Abnormalities Noted: No No Abnormalities Noted: No Callus: No Atrophie Blanche: No Crepitus: No Cyanosis: No Excoriation: No Ecchymosis: No Induration: No Erythema: No Rash: No Hemosiderin Staining: No Scarring: No Mottled: No Pallor: No Moisture Rubor: No No Abnormalities Noted: No Dry / Scaly: No Temperature / Pain Maceration: Yes Temperature: No Abnormality Wound Preparation Ulcer Cleansing: Rinsed/Irrigated with Saline Topical Anesthetic Applied: Other: lidocaine 4%, Treatment Notes Wound #1 (Right, Lateral Lower Leg) 5.  Secondary Dressing Applied Bordered Foam Dressing Notes silvercel Electronic Signature(s) Signed: 03/04/2018 4:11:31 PM By: Secundino Ginger Entered By: Secundino Ginger on 03/04/2018 13:03:56 Raymond Barnett ([id number]) -------------------------------------------------------------------------------- [place] Details Patient Name: Raymond Barnett Date of Service: 03/04/2018 12:45 PM Medical Record Number: [id number] Patient Account Number: [id number] Date of Birth/Sex: [birth year] (84 y.o. M) Treating RN: Secundino Ginger Primary Care Charlen Bakula: Nobie Putnam Other Clinician: Referring Tyse Auriemma: Nobie Putnam Treating Geneieve Duell/Extender: Tito Dine in Treatment: 10 Vital Signs Time Taken: 12:58 Temperature (F): 97.6 Height (in): 70 Pulse (bpm): 85 Weight (lbs): 211 Respiratory Rate (breaths/min): 16 Body Mass Index (BMI): 30.3 Blood Pressure (mmHg): 141/78 Reference Range: 80 - 120 mg / dl Electronic Signature(s) Signed: 03/04/2018 4:11:31 PM By: Secundino Ginger Entered By: Secundino Ginger on 03/04/2018 12:58:40

## 2018-03-06 NOTE — Progress Notes (Signed)
CREW, GOREN ([id number]) Visit Report for 03/04/2018 Debridement Details Patient Name: Raymond Barnett, Raymond Barnett Date of Service: 03/04/2018 12:45 PM Medical Record Number: [id number] Patient Account Number: [id number] Date of Birth/Sex: Jun 23, 1929 (84 y.o. M) Treating RN: Cornell Barman Primary Care Provider: Nobie Putnam Other Clinician: Referring Provider: Nobie Putnam Treating Provider/Extender: Tito Dine in Treatment: 10 Debridement Performed for Wound #1 Right,Lateral Lower Leg Assessment: Performed By: Physician Ricard Dillon, MD Debridement Type: Debridement Severity of Tissue Pre Fat layer exposed Debridement: Level of Consciousness (Pre- Awake and Alert procedure): Pre-procedure Verification/Time Yes - 13:19 Out Taken: Start Time: 13:19 Pain Control: Lidocaine Total Area Debrided (L x W): 1 (cm) x 0.9 (cm) = 0.9 (cm) Tissue and other material Viable, Non-Viable, Slough, Slough debrided: Level: Non-Viable Tissue Debridement Description: Selective/Open Wound Instrument: Curette Bleeding: Minimum End Time: 13:19 Response to Treatment: Procedure was tolerated well Level of Consciousness Awake and Alert (Post-procedure): Post Debridement Measurements of Total Wound Length: (cm) 1 Width: (cm) 0.9 Depth: (cm) 0.3 Volume: (cm) 0.212 Character of Wound/Ulcer Post Debridement: Stable Severity of Tissue Post Debridement: Fat layer exposed Post Procedure Diagnosis Same as Pre-procedure Electronic Signature(s) Signed: 03/04/2018 5:24:02 PM By: Linton Ham MD Signed: 03/04/2018 5:34:01 PM By: Gretta Cool, BSN, RN, CWS, Kim RN, BSN Entered By: Linton Ham on 03/04/2018 13:42:57 Raymond Barnett ([id number]) -------------------------------------------------------------------------------- HPI Details Patient Name: Raymond Barnett Date of Service: 03/04/2018 12:45 PM Medical Record Number: [id number] Patient Account Number:  [id number] Date of Birth/Sex: April 22, 1930 (84 y.o. M) Treating RN: Cornell Barman Primary Care Provider: Nobie Putnam Other Clinician: Referring Provider: Nobie Putnam Treating Provider/Extender: Tito Dine in Treatment: 10 History of Present Illness Severity: furthermore the TBI was 0.33 on the right HPI Description: ADMISSION 12/24/17 This is an 84 year old man who arrives for review of wound on his right lateral ankle area. He states this happened about a year ago when he picked the scab off the area and the wound opened and never really healed. He has been followed by Dr. Governor Specking at [facility] wound care center. He is apparently used a multitude of topical dressings as well as skin substitutes however I'm not certain at this point with scattered skin substitutes were used. His last visit there was on 10/09/17 he's been using sorbact with hydrogel and a covering dressing. He states the wound is actually gotten better with less depth and wound volume. He is frustrated by the slowness of the healing and he is coming here for review. He also lives in [place] and this is a lot closer for him in terms of traveling. I have not had a chance to look through all of Dr. Samule Ohm notes however the wound was listed as a venous ulcer at one point. He had an x-ray done in December 2018 that did not show evidence of osteomyelitis. According to the patient osteomyelitis was ruled out. The patient actually has a complex history in this area. He suffered a serious fracture in the area perhaps 60 years ago while he was in the [employer] when [employer] over his ankle. He had a multitude of surgeries, bone graft and ultimately an ankle fusion. Apparently all the hardware however has been taken out of the ankle quite a while ago. He does not however have a prior wound history. The patient also saw his cardiologist in May who appropriately ordered vascular tests. This  showed an ABI in the right of 0.6 on the left of 1.07. This showed monophasic waveforms  and all of the tibial vessels. 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 Also at 30-49% stenosis of the distal popliteal. furthermore TBI was 0.33 on the right which is markedly reduced. Looking at Dr. Marisue Humble Notes the patient refused to see an interventionalist 12/31/17; right lateral ankle wound.. He sees Dr. and his cardiologist this afternoon and they will discuss further peripheral artery evaluation. He is using Endoform. 01/07/18; right lateral ankle wound. He saw [name] was his cardiologist. He seems to be of the opinion that the patient has critical limb ischemia as a cause for nonhealing of this area. I certainly agree with that. Nevertheless the patient will not agree to an invasive test i.e. an angiogram. Therefore we are left with attempting wound care. We are using Endoform. His wife changes the dressings 01/21/18; right ankle wound just below the right lateral malleolus. Wound looks about the same. Surface does not look as bad as when he first came in here. There is a small divot a roughly 10:00 but I cannot feel bone. Using Endoform 02/04/18; right ankle wound just at the level of the right lateral malleolus. It would appear the area has expanded somewhat in length. 2 small open areas with the bridge of normal skin. There is no evidence of surrounding infection. He still is very reluctant to undergo any invasive vascular assessment. His ABI on the right was 0.6 with monophasic waveforms in all tibial vessels. He still does not want to see an interventional list 03/04/18; monthly visit/palliative visit. This is a patient who has an open area on the level of the right lateral malleolus. He is underlying trauma to the area and probably significant PAD. He does not want any vascular interventions in spite of efforts by myself and his interventional cardiologist [name]. He states he is improved he thinks  the wound is smaller and is certainly less painful Electronic Signature(s) Signed: 03/04/2018 5:24:02 PM By: Linton Ham MD Entered By: Linton Ham on 03/04/2018 13:44:35 Raymond Barnett ([id number]) -------------------------------------------------------------------------------- Physical Exam Details Patient Name: Raymond Barnett Date of Service: 03/04/2018 12:45 PM Medical Record Number: [id number] Patient Account Number: [id number] Date of Birth/Sex: 1930-03-23 (84 y.o. M) Treating RN: Cornell Barman Primary Care Provider: Nobie Putnam Other Clinician: Referring Provider: Nobie Putnam Treating Provider/Extender: Tito Dine in Treatment: 10 Constitutional Sitting or standing Blood Pressure is within target range for patient.. Pulse regular and within target range for patient.Marland Kitchen Respirations regular, non-labored and within target range.Marland Kitchen appears in no distress. Notes Wound exam; wound is at the level of the lateral malleolus on the right. There is a lot of scar tissue here. Using #3 curet I removed surface debris from the wound but did not actually get into the wound surface. At 3:00 there is a rim of epithelialization and I think the deeper area inferiorly is probably come in a bit other than this there is not a major change. The wound base cleans up quite nicely Electronic Signature(s) Signed: 03/04/2018 5:24:02 PM By: Linton Ham MD Entered By: Linton Ham on 03/04/2018 13:45:50 Raymond Barnett ([id number]) -------------------------------------------------------------------------------- Physician Orders Details Patient Name: Raymond Barnett Date of Service: 03/04/2018 12:45 PM Medical Record Number: [id number] Patient Account Number: [id number] Date of Birth/Sex: [birth year] (84 y.o. M) Treating RN: Secundino Ginger Primary Care Provider: Nobie Putnam Other Clinician: Referring Provider: Nobie Putnam Treating  Provider/Extender: Tito Dine in Treatment: 10 Verbal / Phone Orders: No Diagnosis Coding Wound Cleansing Wound #1 Right,Lateral Lower Leg o  Clean wound with Normal Saline. Anesthetic (add to Medication List) Wound #1 Right,Lateral Lower Leg o Topical Lidocaine 4% cream applied to wound bed prior to debridement (In Clinic Only). Primary Wound Dressing Wound #1 Right,Lateral Lower Leg o Silver Alginate Secondary Dressing Wound #1 Right,Lateral Lower Leg o Boardered Foam Dressing Dressing Change Frequency Wound #1 Right,Lateral Lower Leg o Change Dressing Monday, Wednesday, Friday Follow-up Appointments Wound #1 Right,Lateral Lower Leg o Return Appointment in 1 month Edema Control Wound #1 Right,Lateral Lower Leg o Elevate legs to the level of the heart and pump ankles as often as possible Electronic Signature(s) Signed: 03/04/2018 4:11:31 PM By: Secundino Ginger Signed: 03/04/2018 5:24:02 PM By: Linton Ham MD Entered By: Secundino Ginger on 03/04/2018 13:25:02 Raymond Barnett ([id number]) -------------------------------------------------------------------------------- Progress Note Details Patient Name: Raymond Barnett Date of Service: 03/04/2018 12:45 PM Medical Record Number: [id number] Patient Account Number: [id number] Date of Birth/Sex: 1930/02/13 (84 y.o. M) Treating RN: Cornell Barman Primary Care Provider: Nobie Putnam Other Clinician: Referring Provider: Nobie Putnam Treating Provider/Extender: Tito Dine in Treatment: 10 Subjective History of Present Illness (HPI) The following HPI elements were documented for the patient's wound: Severity: furthermore the TBI was 0.33 on the right ADMISSION 12/24/17 This is an 84 year old man who arrives for review of wound on his right lateral ankle area. He states this happened about a year ago when he picked the scab off the area and the wound opened and never really healed. He  has been followed by Dr. Governor Specking at [facility] wound care center. He is apparently used a multitude of topical dressings as well as skin substitutes however I'm not certain at this point with scattered skin substitutes were used. His last visit there was on 10/09/17 he's been using sorbact with hydrogel and a covering dressing. He states the wound is actually gotten better with less depth and wound volume. He is frustrated by the slowness of the healing and he is coming here for review. He also lives in [place] and this is a lot closer for him in terms of traveling. I have not had a chance to look through all of Dr. Samule Ohm notes however the wound was listed as a venous ulcer at one point. He had an x-ray done in December 2018 that did not show evidence of osteomyelitis. According to the patient osteomyelitis was ruled out. The patient actually has a complex history in this area. He suffered a serious fracture in the area perhaps 60 years ago while he was in the [employer] when [employer] over his ankle. He had a multitude of surgeries, bone graft and ultimately an ankle fusion. Apparently all the hardware however has been taken out of the ankle quite a while ago. He does not however have a prior wound history. The patient also saw his cardiologist in May who appropriately ordered vascular tests. This showed an ABI in the right of 0.6 on the left of 1.07. This showed monophasic waveforms and all of the tibial vessels. 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 Also at 30-49% stenosis of the distal popliteal. furthermore TBI was 0.33 on the right which is markedly reduced. Looking at Dr. Marisue Humble Notes the patient refused to see an interventionalist 12/31/17; right lateral ankle wound.. He sees Dr. and his cardiologist this afternoon and they will discuss further peripheral artery evaluation. He is using Endoform. 01/07/18; right lateral ankle wound. He saw [name] was his cardiologist. He  seems to be of the opinion that  the patient has critical limb ischemia as a cause for nonhealing of this area. I certainly agree with that. Nevertheless the patient will not agree to an invasive test i.e. an angiogram. Therefore we are left with attempting wound care. We are using Endoform. His wife changes the dressings 01/21/18; right ankle wound just below the right lateral malleolus. Wound looks about the same. Surface does not look as bad as when he first came in here. There is a small divot a roughly 10:00 but I cannot feel bone. Using Endoform 02/04/18; right ankle wound just at the level of the right lateral malleolus. It would appear the area has expanded somewhat in length. 2 small open areas with the bridge of normal skin. There is no evidence of surrounding infection. He still is very reluctant to undergo any invasive vascular assessment. His ABI on the right was 0.6 with monophasic waveforms in all tibial vessels. He still does not want to see an interventional list 03/04/18; monthly visit/palliative visit. This is a patient who has an open area on the level of the right lateral malleolus. He is underlying trauma to the area and probably significant PAD. He does not want any vascular interventions in spite of efforts by myself and his interventional cardiologist [name]. He states he is improved he thinks the wound is smaller and is certainly less painful MALIQ, PILLEY ([id number]) Objective Constitutional Sitting or standing Blood Pressure is within target range for patient.. Pulse regular and within target range for patient.Marland Kitchen Respirations regular, non-labored and within target range.Marland Kitchen appears in no distress. Vitals Time Taken: 12:58 PM, Height: 70 in, Weight: 211 lbs, BMI: 30.3, Temperature: 97.6 F, Pulse: 85 bpm, Respiratory Rate: 16 breaths/min, Blood Pressure: 141/78 mmHg. General Notes: Wound exam; wound is at the level of the lateral malleolus on the right. There is a lot  of scar tissue here. Using #3 curet I removed surface debris from the wound but did not actually get into the wound surface. At 3:00 there is a rim of epithelialization and I think the deeper area inferiorly is probably come in a bit other than this there is not a major change. The wound base cleans up quite nicely Integumentary (Hair, Skin) Wound #1 status is Open. Original cause of wound was Other Lesion. The wound is located on the Right,Lateral Lower Leg. The wound measures 1cm length x 0.9cm width x 0.3cm depth; 0.707cm^2 area and 0.212cm^3 volume. There is Fat Layer (Subcutaneous Tissue) Exposed exposed. There is no tunneling or undermining noted. There is a small amount of serous drainage noted. The wound margin is distinct with the outline attached to the wound base. There is no granulation within the wound bed. There is a large (67-100%) amount of necrotic tissue within the wound bed including Adherent Slough. The periwound skin appearance exhibited: Maceration. The periwound skin appearance did not exhibit: Callus, Crepitus, Excoriation, Induration, Rash, Scarring, Dry/Scaly, Atrophie Blanche, Cyanosis, Ecchymosis, Hemosiderin Staining, Mottled, Pallor, Rubor, Erythema. Periwound temperature was noted as No Abnormality. Procedures Wound #1 Pre-procedure diagnosis of Wound #1 is a Venous Leg Ulcer located on the Right,Lateral Lower Leg .Severity of Tissue Pre Debridement is: Fat layer exposed. There was a Selective/Open Wound Non-Viable Tissue Debridement with a total area of 0.9 sq cm performed by Ricard Dillon, MD. With the following instrument(s): Curette to remove Viable and Non- Viable tissue/material. Material removed includes [place] after achieving pain control using Lidocaine. No specimens were taken. A time out was conducted at 13:19, prior  to the start of the procedure. A Minimum amount of bleeding was controlled with N/A. The procedure was tolerated well. Post Debridement  Measurements: 1cm length x 0.9cm width x 0.3cm depth; 0.212cm^3 volume. Character of Wound/Ulcer Post Debridement is stable. Severity of Tissue Post Debridement is: Fat layer exposed. Post procedure Diagnosis Wound #1: Same as Pre-Procedure Plan Wound Cleansing: Wound #1 Right,Lateral Lower Leg: JAVIUS, SYLLA ([id number]) Clean wound with Normal Saline. Anesthetic (add to Medication List): Wound #1 Right,Lateral Lower Leg: Topical Lidocaine 4% cream applied to wound bed prior to debridement (In Clinic Only). Primary Wound Dressing: Wound #1 Right,Lateral Lower Leg: Silver Alginate Secondary Dressing: Wound #1 Right,Lateral Lower Leg: Boardered Foam Dressing Dressing Change Frequency: Wound #1 Right,Lateral Lower Leg: Change Dressing Monday, Wednesday, Friday Follow-up Appointments: Wound #1 Right,Lateral Lower Leg: Return Appointment in 1 month Edema Control: Wound #1 Right,Lateral Lower Leg: Elevate legs to the level of the heart and pump ankles as often as possible #1 and 1 continuous over alginate and border foam #2 once again I had a discussion with the patient about vascular interventions risk/benefit versus the risk/benefit of what we are doing currently which is just attempt to get this in a clean and uninfected environment and hoping for the best. He seems to be content with simply dressing this. He has never agreed to consider invasive vascular procedures Electronic Signature(s) Signed: 03/04/2018 5:24:02 PM By: Linton Ham MD Entered By: Linton Ham on 03/04/2018 13:47:28 Raymond Barnett ([id number]) -------------------------------------------------------------------------------- [place] Details Patient Name: Raymond Barnett Date of Service: 03/04/2018 Medical Record Number: [id number] Patient Account Number: [id number] Date of Birth/Sex: [birth year] (84 y.o. 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

## 2018-04-01 ENCOUNTER — Encounter: Payer: Medicare Other | Attending: Internal Medicine | Admitting: Internal Medicine

## 2018-04-01 ENCOUNTER — Encounter: Payer: Self-pay | Admitting: Internal Medicine

## 2018-04-01 ENCOUNTER — Ambulatory Visit (INDEPENDENT_AMBULATORY_CARE_PROVIDER_SITE_OTHER): Payer: Medicare Other | Admitting: Internal Medicine

## 2018-04-01 VITALS — BP 120/60 | HR 87 | Ht 70.0 in | Wt 213.5 lb

## 2018-04-01 DIAGNOSIS — Z79899 Other long term (current) drug therapy: Secondary | ICD-10-CM | POA: Insufficient documentation

## 2018-04-01 DIAGNOSIS — L97311 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 of skin: Secondary | ICD-10-CM | POA: Diagnosis not present

## 2018-04-01 DIAGNOSIS — I70229 Atherosclerosis of native arteries of extremities with rest pain, unspecified extremity: Secondary | ICD-10-CM

## 2018-04-01 DIAGNOSIS — I5032 Chronic diastolic (congestive) heart failure: Secondary | ICD-10-CM

## 2018-04-01 DIAGNOSIS — L97312 Non-pressure chronic ulcer of right ankle with fat layer exposed: Secondary | ICD-10-CM | POA: Diagnosis not present

## 2018-04-01 DIAGNOSIS — I998 Other disorder of circulatory system: Secondary | ICD-10-CM | POA: Diagnosis not present

## 2018-04-01 DIAGNOSIS — I872 Venous insufficiency (chronic) (peripheral): Secondary | ICD-10-CM | POA: Diagnosis not present

## 2018-04-01 DIAGNOSIS — I70233 Atherosclerosis of native arteries of right leg with ulceration of ankle: Secondary | ICD-10-CM | POA: Diagnosis not present

## 2018-04-01 DIAGNOSIS — I10 Essential (primary) hypertension: Secondary | ICD-10-CM | POA: Diagnosis not present

## 2018-04-01 DIAGNOSIS — I739 Peripheral vascular disease, unspecified: Secondary | ICD-10-CM | POA: Diagnosis not present

## 2018-04-01 DIAGNOSIS — E785 Hyperlipidemia, unspecified: Secondary | ICD-10-CM | POA: Diagnosis not present

## 2018-04-01 MED ORDER — METOPROLOL SUCCINATE ER 25 MG PO TB24: 12.5000 mg | ORAL_TABLET | Freq: Every day | ORAL | 3 refills | Status: DC

## 2018-04-01 NOTE — Patient Instructions (Addendum)
Medication Instructions:  Your physician has recommended you make the following change in your medication:  1- START Metoprolol succinate 12.5 mg (0.5 tablet) by mouth once a day.  If you need a refill on your cardiac medications before your next appointment, please call your pharmacy.   Lab work: none If you have labs (blood work) drawn today and your tests are completely normal, you will receive your results only by: Marland Kitchen MyChart Message (if you have MyChart) OR . A paper copy in the mail If you have any lab test that is abnormal or we need to change your treatment, we will call you to review the results.  Testing/Procedures: none  Follow-Up: At [facility], you and your health needs are our priority.  As part of our continuing mission to provide you with exceptional heart care, we have created designated Provider Care Teams.  These Care Teams include your primary Cardiologist (physician) and Advanced Practice Providers (APPs -  Physician Assistants and Nurse Practitioners) who all work together to provide you with the care you need, when you need it. You will need a follow up appointment in 3 months.   You may see DR [name] END or one of the following Advanced Practice Providers on your designated Care Team:   Murray Hodgkins, NP Christell Faith, PA-C . Marrianne Mood, PA-C

## 2018-04-01 NOTE — Progress Notes (Signed)
Follow-up Outpatient Visit Date: 04/01/2018  Primary Care Provider: [name], DO [address] [place] [address]  Chief Complaint: Leg wound and shortness of breath  HPI:  [name] is a 84 y.o. year-old male with history of PAD with slow to heal right ankle wound, hypertension, chronic thrombocytopenia, BPH, arthritis, and remote osteomyelitis, who presents for follow-up of shortness of breath and PAD.  I last saw him in September, at which time he reported slow improvement in his right lateral ankle wound.  I recommended abdominal aortogram and bilateral lower extremity runoff with possible intervention, given concern for critical limb ischemia.  [name] declined this.  We also discussed ischemia evaluation due to chronic shortness of breath and echo demonstrating inferior wall motion abnormality and EKG evidence of prior inferior MI.  He declined this as well.  Today, [name] reports feeling about the same as at our last visit.  He continues to have shortness of breath when bending over or when walking modest distances.  He does not have symptoms when sitting or standing still.  He denies chest pain, palpitations, and lightheadedness.  He has occasional right ankle edema, which he attributes to a remote crush injury.  He notes occasional PND and 1-2 pillow orthonpnea.  [name] right lateral ankle wound is still present, though he states it is slowly healing (has been present for ~1.5 years).  Now follows with Dr. Dellia Nims at the wound care clinic.  --------------------------------------------------------------------------------------------------  Cardiovascular History & Procedures: Cardiovascular Problems:  Dyspnea on exertionwith mildly reduced LVEF  Claudication with poorly healing right ankle wound  Risk Factors:  Hypertension, aortic atherosclerosis, male gender, and age > 33  Cath/PCI:  None  CV Surgery:  None  EP Procedures and  Devices:  None  Non-Invasive Evaluation(s):  Echocardiogram (09/10/2017): Normal LV size with moderate LVH. LVEF mildly reduced at 45-50% with possible inferior/posterior hypokinesis. Grade 2 diastolic dysfunction with elevated filling pressure. Mild aortic and moderate mitral regurgitation. Mild to moderate left atrial enlargement. Mild to moderate pulmonary hypertension.  Bilateral lower extremity arterial ultrasound (09/10/2017): Right lower extremity with moderate distal popliteal and moderate to severe posterior runoff disease most pronounced in the posterior tibial artery. Left lower extremity without significant stenosis. ABI's were 0.6 on the right and 1.1 on the left; TBI's0.33 on the right and 0.59 on the left.  Recent CV Pertinent Labs: Lab Results  Component Value Date   CHOL 95 12/26/2017   CHOL 154 09/10/2017   CHOL 152 07/04/2016   HDL 40 (L) 12/26/2017   HDL 44 09/10/2017   LDLCALC 42 12/26/2017   LDLCALC 91 09/10/2017   TRIG 64 12/26/2017   TRIG 156 (A) 07/04/2016   CHOLHDL 2.4 12/26/2017   K 4.3 02/12/2017   BUN 16 02/12/2017   BUN 12 02/16/2015   CREATININE 1.00 05/19/2017    Past medical and surgical history were reviewed and updated in EPIC.  Current Meds  Medication Sig  . aspirin 81 MG tablet Take 1 tablet by mouth daily.  . Calcium Carb-Cholecalciferol (CALCIUM 600 + D) 600-200 MG-UNIT TABS Take 1 tablet by mouth daily. Patient takes 1000 mg daily.  . Cholecalciferol (VITAMIN D3) 1000 units CAPS   . finasteride (PROSCAR) 5 MG tablet Take 5 mg by mouth daily.  Marland Kitchen ipratropium (ATROVENT) 0.06 % nasal spray Place 2 sprays into both nostrils 4 (four) times daily as needed for rhinitis.  . Lidocaine-Hydrocortisone Ace 2-2 % KIT Use rectal suppository twice daily  as needed for up to 1 week for hemorrhoids  . lisinopril (PRINIVIL,ZESTRIL) 5 MG tablet Take 1 tablet (5 mg total) by mouth daily.  Marland Kitchen loratadine (CLARITIN) 10 MG tablet Take 1 tablet (10 mg  total) by mouth daily.  . naproxen (NAPROSYN) 500 MG tablet daily as needed.  . rosuvastatin (CRESTOR) 5 MG tablet Take 1 tablet (5 mg total) by mouth daily.  . timolol (TIMOPTIC) 0.5 % ophthalmic solution 1 drop 2 (two) times daily.  Marland Kitchen ULTRA FRESH 0.5 % SOLN     Allergies: Patient has no known allergies.  Social History   Tobacco Use  . Smoking status: Former Smoker    Packs/day: 1.00    Years: 30.00    Pack years: 30.00    Types: Cigarettes    Last attempt to quit: 1995    Years since quitting: 24.9  . Smokeless tobacco: Former Network engineer Use Topics  . Alcohol use: Not Currently    Alcohol/week: 1.0 standard drinks    Types: 1 Glasses of wine per week    Frequency: Never  . Drug use: No    Family History  Problem Relation Age of Onset  . Colon cancer Mother 77  . Thrombosis Father 28  . Heart disease Father        Heart valve problem  . AAA (abdominal aortic aneurysm) Father   . Hypertension Sister   . Hyperlipidemia Sister   . Hypertension Sister   . Hyperlipidemia Sister   . Hypertension Sister   . Hyperlipidemia Sister     Review of Systems: A 12-system review of systems was performed and was negative except as noted in the HPI.  --------------------------------------------------------------------------------------------------  Physical Exam: BP 120/60 (BP Location: Left Arm, Patient Position: Sitting, Cuff Size: Large)   Pulse 87   Ht '5\' 10"'$  (1.778 m)   Wt 213 lb 8 oz (96.8 kg)   BMI 30.63 kg/m   General:  NAD.  Accompanied by his wife. HEENT: No conjunctival pallor or scleral icterus. Moist mucous membranes.  OP clear. Neck: Supple without lymphadenopathy, thyromegaly, JVD, or HJR.  Lungs: Normal work of breathing. Clear to auscultation bilaterally without wheezes or crackles. Heart: Regular rate and rhythm without murmurs, rubs, or gallops. Non-displaced PMI. Abd: Bowel sounds present. Soft, NT/ND without hepatosplenomegaly Ext: Trace right  ankle/calf edema with evidence of prior trauma.  Right lateral calf wound is covered with a clean dressing.  Radial pulses are 2+.  Pedal pulses are absent bilaterally. Skin: Warm and dry.  EKG:  NSR with borderline 1st degree AVB and RBBB.  Inferior Q waves.  Lab Results  Component Value Date   WBC 7.1 02/12/2017   HGB 13.4 02/12/2017   HCT 39.7 (L) 02/12/2017   MCV 97.6 02/12/2017   PLT 124 (L) 02/12/2017    Lab Results  Component Value Date   NA 136 02/12/2017   K 4.3 02/12/2017   CL 103 02/12/2017   CO2 24 02/12/2017   BUN 16 02/12/2017   CREATININE 1.00 05/19/2017   GLUCOSE 88 02/12/2017   ALT 20 12/26/2017    Lab Results  Component Value Date   CHOL 95 12/26/2017   HDL 40 (L) 12/26/2017   LDLCALC 42 12/26/2017   TRIG 64 12/26/2017   CHOLHDL 2.4 12/26/2017    --------------------------------------------------------------------------------------------------  ASSESSMENT AND PLAN: Peripheral vascular disease with likely critical limb ischemia I am concerned that right ankle wound that has still not healed (present ~1.5 years) is reflective of critical  limb ischemia.  ABI's showed at least moderate right lower extremity disease.  I have again encouraged Mr. Conley to consider angiography and possible intervention, but he has declined.  He should continue aspirin and rosuvastatin.  He is not a candidate for cilostazol in the setting of his cardiomyopathy.  He should continue regular wound care with Dr. Dellia Nims.  HFpEF Symptoms stable (NYHA class III).  Mr. Stiff appears grossly euvolemic on exam.  I have recommended catheterization to better evaluate the etiology of his cardiomyopathy (most likely ischemic given inferior Q waves on EKG and regional wall motion abnormality on echo).  He does not wish to pursue and invasive testing.  We have agreed to start metoprolol succinate 12.5 mg daily for both heart failure and anginal therapy.  I encouraged sodium restriction.  No  indication for standing diuretic at this time.  Hyperlipidemia LDL at goal (less than 70).  Continue rosuvastatin 5 mg daily.  Follow-up: Return to clinic in 3 months.  Nelva Bush, MD 04/03/2018 10:08 AM

## 2018-04-03 DIAGNOSIS — I998 Other disorder of circulatory system: Secondary | ICD-10-CM | POA: Insufficient documentation

## 2018-04-03 DIAGNOSIS — I70229 Atherosclerosis of native arteries of extremities with rest pain, unspecified extremity: Secondary | ICD-10-CM | POA: Insufficient documentation

## 2018-04-03 DIAGNOSIS — I5032 Chronic diastolic (congestive) heart failure: Secondary | ICD-10-CM | POA: Insufficient documentation

## 2018-04-03 DIAGNOSIS — I5033 Acute on chronic diastolic (congestive) heart failure: Secondary | ICD-10-CM | POA: Insufficient documentation

## 2018-04-03 NOTE — Progress Notes (Signed)
Raymond Barnett, Raymond Barnett ([id number]) Visit Report for 04/01/2018 HPI Details Patient Name: Raymond Barnett, Raymond Barnett Date of Service: 04/01/2018 12:45 PM Medical Record Number: [id number] Patient Account Number: [id number] Date of Birth/Sex: Jul 31, 1929 (84 y.o. M) Treating RN: Cornell Barman Primary Care Provider: Nobie Putnam Other Clinician: Referring Provider: Nobie Putnam Treating Provider/Extender: Tito Dine in Treatment: 14 History of Present Illness Severity: furthermore the TBI was 0.33 on the right HPI Description: ADMISSION 12/24/17 This is an 84 year old man who arrives for review of wound on his right lateral ankle area. He states this happened about a year ago when he picked the scab off the area and the wound opened and never really healed. He has been followed by Dr. Governor Specking at [facility] wound care center. He is apparently used a multitude of topical dressings as well as skin substitutes however I'm not certain at this point with scattered skin substitutes were used. His last visit there was on 10/09/17 he's been using sorbact with hydrogel and a covering dressing. He states the wound is actually gotten better with less depth and wound volume. He is frustrated by the slowness of the healing and he is coming here for review. He also lives in [place] and this is a lot closer for him in terms of traveling. I have not had a chance to look through all of Dr. Samule Ohm notes however the wound was listed as a venous ulcer at one point. He had an x-ray done in December 2018 that did not show evidence of osteomyelitis. According to the patient osteomyelitis was ruled out. The patient actually has a complex history in this area. He suffered a serious fracture in the area perhaps 60 years ago while he was in the [employer] when [employer] over his ankle. He had a multitude of surgeries, bone graft and ultimately an ankle fusion. Apparently all the hardware  however has been taken out of the ankle quite a while ago. He does not however have a prior wound history. The patient also saw his cardiologist in May who appropriately ordered vascular tests. This showed an ABI in the right of 0.6 on the left of 1.07. This showed monophasic waveforms and all of the tibial vessels. 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 Also at 30-49% stenosis of the distal popliteal. furthermore TBI was 0.33 on the right which is markedly reduced. Looking at Dr. Marisue Humble Notes the patient refused to see an interventionalist 12/31/17; right lateral ankle wound.. He sees Dr. and his cardiologist this afternoon and they will discuss further peripheral artery evaluation. He is using Endoform. 01/07/18; right lateral ankle wound. He saw [name] was his cardiologist. He seems to be of the opinion that the patient has critical limb ischemia as a cause for nonhealing of this area. I certainly agree with that. Nevertheless the patient will not agree to an invasive test i.e. an angiogram. Therefore we are left with attempting wound care. We are using Endoform. His wife changes the dressings 01/21/18; right ankle wound just below the right lateral malleolus. Wound looks about the same. Surface does not look as bad as when he first came in here. There is a small divot a roughly 10:00 but I cannot feel bone. Using Endoform 02/04/18; right ankle wound just at the level of the right lateral malleolus. It would appear the area has expanded somewhat in length. 2 small open areas with the bridge of normal skin. There is no evidence of surrounding infection. He still is  very reluctant to undergo any invasive vascular assessment. His ABI on the right was 0.6 with monophasic waveforms in all tibial vessels. He still does not want to see an interventional list 03/04/18; monthly visit/palliative visit. This is a patient who has an open area on the level of the right lateral malleolus. He is underlying  trauma to the area and probably significant PAD. He does not want any vascular interventions in spite of efforts by myself and his interventional cardiologist [name]. He states he is improved he thinks the wound is smaller and is certainly less painful 04/01/18 Patient I see on a palliative basis. He has a small open area at the level of the right lateral malleolus. There is undermining superiorly of roughly 0.4 cm. I think this is an ischemic wound but he does not want any interventions Raymond Barnett, Raymond Barnett ([id number]) Electronic Signature(s) Signed: 04/01/2018 4:51:45 PM By: Linton Ham MD Entered By: Linton Ham on 04/01/2018 13:20:44 Raymond Barnett, Raymond Barnett ([id number]) -------------------------------------------------------------------------------- Physical Exam Details Patient Name: Raymond Barnett Date of Service: 04/01/2018 12:45 PM Medical Record Number: [id number] Patient Account Number: [id number] Date of Birth/Sex: 12/22/1929 (84 y.o. M) Treating RN: Cornell Barman Primary Care Provider: Nobie Putnam Other Clinician: Referring Provider: Nobie Putnam Treating Provider/Extender: Tito Dine in Treatment: 14 Constitutional Patient is hypertensive.. Pulse regular and within target range for patient.Marland Kitchen Respirations regular, non-labored and within target range.. Temperature is normal and within the target range for the patient.Marland Kitchen appears in no distress. Eyes Conjunctivae have some redness. Mild discharge noted.Marland Kitchen Respiratory Respiratory effort is easy and symmetric bilaterally. Rate is normal at rest and on room air.. Cardiovascular Pedal pulses absent bilaterally.. Lymphatic None palpable in the popliteal area bilaterally. Psychiatric No evidence of depression, anxiety, or agitation. Calm, cooperative, and communicative. Appropriate interactions and affect.. Notes Wound exam; wound is on the lateral malleolus on the right. The surfaces actually look  better than a month ago. No debridement was necessary. He has undermining from roughly 7-10 o'clock at 0.4 cm. I think the wound is actually smaller Electronic Signature(s) Signed: 04/01/2018 4:51:45 PM By: Linton Ham MD Entered By: Linton Ham on 04/01/2018 13:23:55 Raymond Barnett ([id number]) -------------------------------------------------------------------------------- Physician Orders Details Patient Name: Raymond Barnett Date of Service: 04/01/2018 12:45 PM Medical Record Number: [id number] Patient Account Number: [id number] Date of Birth/Sex: [birth year] (84 y.o. M) Treating RN: Cornell Barman Primary Care Provider: Nobie Putnam Other Clinician: Referring Provider: Nobie Putnam Treating Provider/Extender: Tito Dine in Treatment: 47 Verbal / Phone Orders: No Diagnosis Coding Wound Cleansing Wound #1 Right,Lateral Lower Leg o Clean wound with Normal Saline. Anesthetic (add to Medication List) Wound #1 Right,Lateral Lower Leg o Topical Lidocaine 4% cream applied to wound bed prior to debridement (In Clinic Only). Primary Wound Dressing Wound #1 Right,Lateral Lower Leg o Silver Collagen Secondary Dressing Wound #1 Right,Lateral Lower Leg o Boardered Foam Dressing Dressing Change Frequency Wound #1 Right,Lateral Lower Leg o Change Dressing Monday, Wednesday, Friday Follow-up Appointments Wound #1 Right,Lateral Lower Leg o Return Appointment in 1 month Edema Control Wound #1 Right,Lateral Lower Leg o Elevate legs to the level of the heart and pump ankles as often as possible Electronic Signature(s) Signed: 04/01/2018 4:51:45 PM By: Linton Ham MD Signed: 04/01/2018 5:09:03 PM By: Gretta Cool, BSN, RN, CWS, Kim RN, BSN Entered By: Gretta Cool, BSN, RN, CWS, Kim on 04/01/2018 13:05:20 Raymond Barnett, Raymond Barnett ([id number]) -------------------------------------------------------------------------------- Problem List Details Patient Name:  Raymond Barnett Date of Service: 04/01/2018 12:45 PM Medical Record  Number: [id number] Patient Account Number: [id number] Date of Birth/Sex: 09-27-1929 (84 y.o. 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 M) Treating RN: Cornell Barman Primary Care Provider: Nobie Putnam Other Clinician: Referring Provider: Nobie Putnam Treating Provider/Extender: Tito Dine in Treatment: 14 Subjective History of Present Illness (HPI) The following HPI elements were documented for the patient's wound: Severity: furthermore the TBI was 0.33 on the right ADMISSION 12/24/17 This is an 84 year old man who arrives for review of wound on his right lateral ankle area. He states this happened about a year ago when he picked the scab off the area and the wound opened and never really healed. He has been followed by Dr. Governor Specking at [facility] wound care center. He is apparently used a multitude of topical dressings as well as skin substitutes however I'm not certain at  this point with scattered skin substitutes were used. His last visit there was on 10/09/17 he's been using sorbact with hydrogel and a covering dressing. He states the wound is actually gotten better with less depth and wound volume. He is frustrated by the slowness of the healing and he is coming here for review. He also lives in [place] and this is a lot closer for him in terms of traveling. I have not had a chance to look through all of Dr. Samule Ohm notes however the wound was listed as a venous ulcer at one point. He had an x-ray done in December 2018 that did not show evidence of osteomyelitis. According to the patient osteomyelitis was ruled out. The patient actually has a complex history in this area. He suffered a serious fracture in the area perhaps 60 years ago while he was in the [employer] when [employer] over his ankle. He had a multitude of surgeries, bone graft and ultimately an ankle fusion. Apparently all the hardware however has been taken out of the ankle quite a while ago. He does not however have a prior wound history. The patient also saw his cardiologist in May who appropriately ordered vascular tests. This showed an ABI in the right of 0.6 on the left of 1.07. This showed monophasic waveforms and all of the tibial vessels. 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 Also at 30-49% stenosis of the distal popliteal. furthermore TBI was 0.33 on the right which is markedly reduced. Looking at Dr. Marisue Humble Notes the patient refused to see an interventionalist 12/31/17; right lateral ankle wound.. He sees Dr. and his cardiologist this afternoon and they will discuss further peripheral artery evaluation. He is using Endoform. 01/07/18; right lateral ankle wound. He saw [name] was his cardiologist. He seems to be of the opinion that the patient has critical limb ischemia as a cause for nonhealing of this area. I certainly agree with that. Nevertheless the patient will not agree to an  invasive test i.e. an angiogram. Therefore we are left with attempting wound care. We are using Endoform. His wife changes the dressings 01/21/18; right ankle wound just below the right lateral malleolus. Wound looks about the same. Surface does not look as bad as when he first came in here. There is a small divot a roughly 10:00 but I cannot feel bone. Using Endoform 02/04/18; right ankle wound just at the level of the right lateral malleolus. It would appear the area has expanded somewhat in length. 2 small open areas with the bridge of normal skin. There is no evidence of surrounding infection. He still is very reluctant to undergo any invasive vascular assessment. His ABI on the right was 0.6 with monophasic waveforms in all tibial vessels. He still does not want to see an interventional list 03/04/18; monthly visit/palliative visit. This is a patient who has an open area on the level of the right lateral malleolus. He is underlying trauma to the area and probably significant PAD. He does not want any vascular interventions in spite of efforts by myself and his interventional cardiologist [name]. He states he is improved he thinks the wound is smaller and is certainly less painful 04/01/18 Patient I see on a palliative basis. He has a small open area at the level of the right lateral malleolus. There is undermining superiorly of roughly 0.4 cm. I think this is an ischemic wound but he does not want any interventions Raymond Barnett, Raymond Barnett ([id number]) Objective Constitutional Patient is hypertensive.. Pulse regular and within target range for patient.Marland Kitchen Respirations regular, non-labored and within target range.. Temperature is normal and within the target range for the patient.Marland Kitchen appears in no distress. Vitals Time Taken: 12:52 PM, Height: 70 in, Weight: 211 lbs, BMI: 30.3, Temperature: 97.5 F, Pulse: 78 bpm, Respiratory Rate: 18 breaths/min, Blood Pressure: 147/75 mmHg. General Notes: Wound exam;  wound is on the lateral malleolus on the right. The surfaces actually look better than a month ago. No debridement was necessary. He has undermining from roughly 7-10 o'clock at 0.4 cm. I think the wound is actually smaller Integumentary (Hair, Skin) Wound #1 status is Open. Original cause of wound was Other Lesion. The wound is located on the Right,Lateral Lower Leg. The wound measures 0.8cm length x 0.8cm width x 0.3cm depth; 0.503cm^2 area and 0.151cm^3 volume. There is Fat Layer (Subcutaneous Tissue) Exposed exposed. There is no tunneling noted, however, there is undermining starting at 7:00 and ending at 10:00 with a maximum distance of 0.4cm. There is a small amount of serous drainage noted. The wound margin is distinct with the outline attached to the wound base. There is no granulation within the wound bed. There is a large (67-100%) amount of necrotic tissue within the wound bed including Adherent Slough. The periwound skin appearance exhibited: Maceration. The periwound skin appearance did not exhibit: Callus, Crepitus, Excoriation, Induration, Rash, Scarring, Dry/Scaly, Atrophie Blanche, Cyanosis, Ecchymosis, Hemosiderin Staining, Mottled, Pallor, Rubor, Erythema. Periwound temperature was noted as No Abnormality. 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 Anesthetic (add to Medication List): Wound #1 Right,Lateral Lower Leg: Topical Lidocaine 4% cream applied to wound bed prior to debridement (In Clinic Only). Raymond Barnett, Raymond Barnett ([id number]) Primary Wound Dressing: Wound #1 Right,Lateral Lower Leg: Silver Collagen Secondary Dressing: Wound #1 Right,Lateral Lower Leg: Boardered Foam  Dressing Dressing Change Frequency: Wound #1 Right,Lateral Lower  Leg: Change Dressing Monday, Wednesday, Friday Follow-up Appointments: Wound #1 Right,Lateral Lower Leg: Return Appointment in 1 month Edema Control: Wound #1 Right,Lateral Lower Leg: Elevate legs to the level of the heart and pump ankles as often as possible 1. The wound bed is somewhat dry and I think might benefit from a trial of moistened dressing like silver collagen. We will change today 2. He has no palpable arterial pulses in his foot and I think this is no doubt contributing to the pathogenesis of this wound and also the nonhealing status. He however does not describe pain Electronic Signature(s) Signed: 04/01/2018 4:51:45 PM By: Linton Ham MD Entered By: Linton Ham on 04/01/2018 13:22:59 Raymond Barnett ([id number]) -------------------------------------------------------------------------------- [place] Details Patient Name: Raymond Barnett Date of Service: 04/01/2018 Medical Record Number: [id number] Patient Account Number: [id number] Date of Birth/Sex: [birth year] (84 y.o. 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

## 2018-04-17 ENCOUNTER — Encounter: Payer: Self-pay | Admitting: Family Medicine

## 2018-04-17 ENCOUNTER — Ambulatory Visit (INDEPENDENT_AMBULATORY_CARE_PROVIDER_SITE_OTHER): Payer: Medicare Other | Admitting: Family Medicine

## 2018-04-17 VITALS — BP 132/64 | HR 81 | Temp 98.0°F | Resp 16 | Ht 70.0 in | Wt 212.6 lb

## 2018-04-17 DIAGNOSIS — J3089 Other allergic rhinitis: Secondary | ICD-10-CM | POA: Diagnosis not present

## 2018-04-17 DIAGNOSIS — J0100 Acute maxillary sinusitis, unspecified: Secondary | ICD-10-CM | POA: Diagnosis not present

## 2018-04-17 MED ORDER — BENZONATATE 100 MG PO CAPS: 100.0000 mg | ORAL_CAPSULE | Freq: Three times a day (TID) | ORAL | 0 refills | Status: DC | PRN

## 2018-04-17 MED ORDER — FLUTICASONE PROPIONATE 50 MCG/ACT NA SUSP: 2.0000 | Freq: Every day | NASAL | 3 refills | Status: DC

## 2018-04-17 MED ORDER — AMOXICILLIN-POT CLAVULANATE 875-125 MG PO TABS: 1.0000 | ORAL_TABLET | Freq: Two times a day (BID) | ORAL | 0 refills | Status: DC

## 2018-04-17 NOTE — Patient Instructions (Addendum)
Thank you for coming to the office today.  1. It sounds like you have a Sinusitis (Bacterial Infection) - this most likely started as an Upper Respiratory Virus that has settled into an infection. Allergies can also cause this. - Start Augmentin 1 pill twice daily (breakfast and dinner, with food and plenty of water) for 10 days, complete entire course, do not stop early even if feeling better - Start Flonase 2 sprays in each nostril daily for next 4-6 weeks, then you may stop and use seasonally or as needed  HOLD Atrovent nasal spray decongestant - may use this as needed only if bad day or night - can help reduce congestion quickly but hold off on daily use for now  - Recommend to start using Nasal Saline spray multiple times a day to help flush out congestion and clear sinuses - Improve hydration by drinking plenty of clear fluids (water, gatorade) to reduce secretions and thin congestion - Congestion draining down throat can cause irritation. May try warm herbal tea with honey, cough drops - Can take Tylenol or Ibuprofen as needed for fevers  If you develop persistent fever >101F for at least 3 consecutive days, headaches with sinus pain or pressure or persistent earache, please schedule a follow-up evaluation within next few days to week.  Please schedule a Follow-up Appointment to: Return for keep apt as scheduled February.  If you have any other questions or concerns, please feel free to call the office or send a message through [facility]. You may also schedule an earlier appointment if necessary.  Additionally, you may be receiving a survey about your experience at our office within a few days to 1 week by e-mail or mail. We value your feedback.  Nobie Putnam, DO [place]

## 2018-04-17 NOTE — Progress Notes (Signed)
Subjective:    Patient ID: Raymond Barnett, male    DOB: 01-18-1930, 84 y.o.   MRN: [id number]  Raymond Barnett is a 84 y.o. male presenting on 04/17/2018 for Cough (onset week cough gets worst when patient is lying down, runny nose sometime shows blood, sinusitis denies fever or chills OTC mucinex was taken)   HPI   SINUSITIS Reports symptoms worse over past 1 week, onset few weeks ago, actually he states similar symptoms compared to August 2019 last seen 8/28, he was given Atrovent nasal spray, in past used Flonase, he feels worse at night when lay down will get "tight with thicker mucus", admits some sinus pressure and headache at times - Taking Mucinex regularly - Admits worse short of breath and coughing recently - He has been worked up for dyspnea previously, followed by Cardiology they offered cardiac cath and he has declined, he has not seen Pulmonology, was not ready to see them previously - Denies chest pain or tightness, syncope or near syncope, edema, fever, chills, sweats, body aches  Health Maintenance: UTD Flu Vaccine this season.  Depression screen [facility] 2/9 04/17/2018 12/17/2017 09/16/2017  Decreased Interest 0 0 0  Down, Depressed, Hopeless 0 0 0  PHQ - 2 Score 0 0 0  Altered sleeping - - -  Tired, decreased energy - - -  Change in appetite - - -  Feeling bad or failure about yourself  - - -  Trouble concentrating - - -  Moving slowly or fidgety/restless - - -  Suicidal thoughts - - -  PHQ-9 Score - - -  Difficult doing work/chores - - -    Social History   Tobacco Use  . Smoking status: Former Smoker    Packs/day: 1.00    Years: 30.00    Pack years: 30.00    Types: Cigarettes    Last attempt to quit: 1995    Years since quitting: 25.0  . Smokeless tobacco: Former Network engineer Use Topics  . Alcohol use: Not Currently    Alcohol/week: 1.0 standard drinks    Types: 1 Glasses of wine per week    Frequency: Never  . Drug use: No    Review of  Systems Per HPI unless specifically indicated above     Objective:    BP 132/64   Pulse 81   Temp 98 F (36.7 C)   Resp 16   Ht 5\' 10"  (1.778 m)   Wt 212 lb 9.6 oz (96.4 kg)   SpO2 98%   BMI 30.50 kg/m   Wt Readings from Last 3 Encounters:  04/17/18 212 lb 9.6 oz (96.4 kg)  04/01/18 213 lb 8 oz (96.8 kg)  12/31/17 212 lb (96.2 kg)    Physical Exam Vitals signs and nursing note reviewed.  Constitutional:      General: He is not in acute distress.    Appearance: He is well-developed. He is not diaphoretic.     Comments: Well-appearing, comfortable, cooperative  HENT:     Head: Normocephalic and atraumatic.     Comments: Frontal / maxillary sinuses non-tender. Nares patent with some turbinate edema and congestion without purulence. Bilateral TMs clear without erythema, effusion or bulging. Oropharynx clear without erythema, exudates, edema or asymmetry. Eyes:     General:        Right eye: No discharge.        Left eye: No discharge.     Conjunctiva/sclera: Conjunctivae normal.  Neck:  Musculoskeletal: Normal range of motion and neck supple.     Thyroid: No thyromegaly.  Cardiovascular:     Rate and Rhythm: Normal rate and regular rhythm.     Heart sounds: Normal heart sounds. No murmur.  Pulmonary:     Effort: Pulmonary effort is normal. No respiratory distress.     Breath sounds: Normal breath sounds. No wheezing or rales.     Comments: Occasional cough Musculoskeletal: Normal range of motion.  Lymphadenopathy:     Cervical: No cervical adenopathy.  Skin:    General: Skin is warm and dry.     Findings: No erythema or rash.  Neurological:     Mental Status: He is alert and oriented to person, place, and time.  Psychiatric:        Behavior: Behavior normal.     Comments: Well groomed, good eye contact, normal speech and thoughts    Results for orders placed or performed during the hospital encounter of 12/26/17  Lipid panel  Result Value Ref Range    Cholesterol 95 0 - 200 mg/dL   Triglycerides 64 <150 mg/dL   HDL 40 (L) >40 mg/dL   Total CHOL/HDL Ratio 2.4 RATIO   VLDL 13 0 - 40 mg/dL   LDL Cholesterol 42 0 - 99 mg/dL  ALT  Result Value Ref Range   ALT 20 0 - 44 U/L      Assessment & Plan:   Problem List Items Addressed This Visit    Allergic rhinitis due to allergen - Primary   Relevant Medications   fluticasone (FLONASE) 50 MCG/ACT nasal spray    Other Visit Diagnoses    Acute non-recurrent maxillary sinusitis       Relevant Medications   fluticasone (FLONASE) 50 MCG/ACT nasal spray   benzonatate (TESSALON) 100 MG capsule   amoxicillin-clavulanate (AUGMENTIN) 875-125 MG tablet      Consistent with acute maxillary rhinosinusitis, likely initially viral URI vs allergic rhinitis component with worsening concern for bacterial infection now duration >1+ months  Plan: 1. Start Augmentin 875-125mg  PO BID x 10 days 2. Start Flonase 2 sprays in each nostril daily for next 4-6 weeks, then may stop and use seasonally or as needed - Start [employer] take 1 capsule up to 3 times a day as needed for cough - HOLD Atrovent nasal decongestant only use PRN now if need Supportive care with nasal saline OTC, hydration Return criteria reviewed  Follow-up as planned - in future may refer to Pulmonology if dyspnea is worsening as previously discussed  Meds ordered this encounter  Medications  . fluticasone (FLONASE) 50 MCG/ACT nasal spray    Sig: Place 2 sprays into both nostrils daily. Use for 4-6 weeks then stop and use seasonally or as needed.    Dispense:  16 g    Refill:  3  . benzonatate (TESSALON) 100 MG capsule    Sig: Take 1 capsule (100 mg total) by mouth 3 (three) times daily as needed for cough.    Dispense:  30 capsule    Refill:  0  . amoxicillin-clavulanate (AUGMENTIN) 875-125 MG tablet    Sig: Take 1 tablet by mouth 2 (two) times daily. For 10 days    Dispense:  20 tablet    Refill:  0      Follow up  plan: Return for keep apt as scheduled February.   Raymond Barnett, [facility] 04/17/2018, 11:42 AM

## 2018-04-28 ENCOUNTER — Other Ambulatory Visit: Payer: Self-pay | Admitting: Family Medicine

## 2018-04-29 ENCOUNTER — Encounter: Payer: Medicare Other | Attending: Internal Medicine | Admitting: Internal Medicine

## 2018-04-29 DIAGNOSIS — L97311 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 of skin: Secondary | ICD-10-CM | POA: Diagnosis not present

## 2018-04-29 DIAGNOSIS — L97312 Non-pressure chronic ulcer of right ankle with fat layer exposed: Secondary | ICD-10-CM | POA: Diagnosis not present

## 2018-04-29 DIAGNOSIS — I70233 Atherosclerosis of native arteries of right leg with ulceration of ankle: Secondary | ICD-10-CM | POA: Insufficient documentation

## 2018-04-29 DIAGNOSIS — I872 Venous insufficiency (chronic) (peripheral): Secondary | ICD-10-CM | POA: Diagnosis not present

## 2018-04-29 DIAGNOSIS — M069 Rheumatoid arthritis, unspecified: Secondary | ICD-10-CM | POA: Insufficient documentation

## 2018-04-30 NOTE — Progress Notes (Signed)
JAASIEL, Raymond Barnett ([id number]) Visit Report for 04/29/2018 Arrival Information Details Patient Name: Raymond Barnett, CLASS Date of Service: 04/29/2018 2:45 PM Medical Record Number: [id number] Patient Account Number: [id number] Date of Birth/Sex: 06/22/1929 (85 y.o. M) Treating RN: Montey Hora Primary Care Giulietta Prokop: Nobie Putnam Other Clinician: Referring Giannis Corpuz: Nobie Putnam Treating Laryssa Hassing/Extender: Tito Dine in Treatment: 18 Visit Information History Since Last Visit Added or deleted any medications: No Patient Arrived: Cane Any new allergies or adverse reactions: No Arrival Time: 14:48 Had a fall or experienced change in No Accompanied By: wife activities of daily living that may affect Transfer Assistance: None risk of falls: Patient Identification Verified: Yes Signs or symptoms of abuse/neglect since last visito No Secondary Verification Process Completed: Yes Hospitalized since last visit: No Implantable device outside of the clinic excluding No cellular tissue based products placed in the center since last visit: Has Dressing in Place as Prescribed: Yes Pain Present Now: No Electronic Signature(s) Signed: 04/29/2018 4:30:10 PM By: Lorine Bears RCP, RRT, CHT Entered By: Lorine Bears on 04/29/2018 14:49:35 Raymond Barnett ([id number]) -------------------------------------------------------------------------------- Encounter Discharge Information Details Patient Name: Raymond Barnett Date of Service: 04/29/2018 2:45 PM Medical Record Number: [id number] Patient Account Number: [id number] Date of Birth/Sex: 01/03/1930 (85 y.o. M) Treating RN: Montey Hora Primary Care Lorane Cousar: Nobie Putnam Other Clinician: Referring Naida Escalante: Nobie Putnam Treating Trinaty Bundrick/Extender: Tito Dine in Treatment: 66 Encounter Discharge Information Items Post Procedure Vitals Discharge Condition:  Stable Temperature (F): 97.9 Ambulatory Status: Ambulatory Pulse (bpm): 81 Discharge Destination: Home Respiratory Rate (breaths/min): 18 Transportation: Private Auto Blood Pressure (mmHg): 147/75 Accompanied By: wife Schedule Follow-up Appointment: Yes Clinical Summary of Care: Electronic Signature(s) Signed: 04/29/2018 5:48:26 PM By: Montey Hora Entered By: Montey Hora on 04/29/2018 15:18:57 Raymond Barnett ([id number]) -------------------------------------------------------------------------------- Lower Extremity Assessment Details Patient Name: Raymond Barnett Date of Service: 04/29/2018 2:45 PM Medical Record Number: [id number] Patient Account Number: [id number] Date of Birth/Sex: Jul 26, 1929 (85 y.o. M) Treating RN: Secundino Ginger Primary Care Sharnetta Gielow: Nobie Putnam Other Clinician: Referring Lillee Mooneyhan: Nobie Putnam Treating Belanna Manring/Extender: Tito Dine in Treatment: 18 Edema Assessment Assessed: [Left: No] [Right: No] Edema: [Left: N] [Right: o] Calf Left: Right: Point of Measurement: 37 cm From Medial Instep cm 33 cm Ankle Left: Right: Point of Measurement: 14 cm From Medial Instep cm 24 cm Vascular Assessment Claudication: Claudication Assessment [Right:None] Pulses: Posterior Tibial Extremity colors, hair growth, and conditions: Extremity Color: [Right:Hyperpigmented] Hair Growth on Extremity: [Right:No] Temperature of Extremity: [Right:Cool] Capillary Refill: [Right:< 3 seconds] Toe Nail Assessment Left: Right: Thick: No Discolored: No Deformed: No Electronic Signature(s) Signed: 04/29/2018 4:13:45 PM By: Secundino Ginger Entered By: Secundino Ginger on 04/29/2018 15:00:29 Raymond Barnett ([id number]) -------------------------------------------------------------------------------- Multi Wound Chart Details Patient Name: Raymond Barnett Date of Service: 04/29/2018 2:45 PM Medical Record Number: [id number] Patient Account Number:  [id number] Date of Birth/Sex: 17-Feb-1930 (85 y.o. M) Treating RN: Montey Hora Primary Care Lonnetta Kniskern: Nobie Putnam Other Clinician: Referring Jermayne Sweeney: Nobie Putnam Treating Feras Gardella/Extender: Tito Dine in Treatment: 18 Vital Signs Height(in): 70 Pulse(bpm): 81 Weight(lbs): 211 Blood Pressure(mmHg): 147/75 Body Mass Index(BMI): 30 Temperature(F): 97.9 Respiratory Rate 16 (breaths/min): Photos: [N/A:N/A] Wound Location: Right Lower Leg - Lateral N/A N/A Wounding Event: Other Lesion N/A N/A Primary Etiology: Venous Leg Ulcer N/A N/A Comorbid History: Glaucoma, Hypertension, N/A N/A Peripheral Venous Disease, Rheumatoid Arthritis Date Acquired: 11/20/2017 N/A N/A Weeks of Treatment: 18 N/A N/A Wound Status: Open N/A N/A Measurements L x W x D  0.8x0.6x0.3 N/A N/A (cm) Area (cm) : 0.377 N/A N/A Volume (cm) : 0.113 N/A N/A % Reduction in Area: 40.00% N/A N/A % Reduction in Volume: 10.30% N/A N/A Classification: Full Thickness Without N/A N/A Exposed Support Structures Exudate Amount: Small N/A N/A Exudate Type: Serous N/A N/A Exudate Color: amber N/A N/A Wound Margin: Distinct, outline attached N/A N/A Granulation Amount: None Present (0%) N/A N/A Necrotic Amount: Small (1-33%) N/A N/A Exposed Structures: Fat Layer (Subcutaneous N/A N/A Tissue) Exposed: Yes Fascia: No Tendon: No Muscle: No ILYAAS, MUSTO ([id number]) Joint: No Bone: No Epithelialization: None N/A N/A Debridement: Debridement - Selective/Open N/A N/A Wound Pre-procedure 15:15 N/A N/A Verification/Time Out Taken: Pain Control: Lidocaine 4% Topical Solution N/A N/A Tissue Debrided: Necrotic/Eschar N/A N/A Level: Non-Viable Tissue N/A N/A Debridement Area (sq cm): 0.48 N/A N/A Instrument: Curette N/A N/A Bleeding: None N/A N/A Procedural Pain: 0 N/A N/A Post Procedural Pain: 0 N/A N/A Debridement Treatment Procedure was tolerated well N/A N/A Response: Post  Debridement 0.8x0.6x0.3 N/A N/A Measurements L x W x D (cm) Post Debridement Volume: 0.113 N/A N/A (cm) Periwound Skin Texture: Excoriation: No N/A N/A Induration: No Callus: No Crepitus: No Rash: No Scarring: No Periwound Skin Moisture: Maceration: Yes N/A N/A Dry/Scaly: No Periwound Skin Color: Atrophie Blanche: No N/A N/A Cyanosis: No Ecchymosis: No Erythema: No Hemosiderin Staining: No Mottled: No Pallor: No Rubor: No Temperature: No Abnormality N/A N/A Tenderness on Palpation: No N/A N/A Wound Preparation: Ulcer Cleansing: N/A N/A Rinsed/Irrigated with Saline Topical Anesthetic Applied: Other: lidocaine 4% Procedures Performed: Debridement N/A N/A Treatment Notes Wound #1 (Right, Lateral Lower Leg) Notes prisma and bordered foam dressing Electronic Signature(s) Signed: 04/29/2018 5:09:58 PM By: Linton Ham MD Raymond Barnett ([id number]) Entered By: Linton Ham on 04/29/2018 15:35:05 Raymond Barnett ([id number]) -------------------------------------------------------------------------------- Multi-Disciplinary Care Plan Details Patient Name: Raymond Barnett Date of Service: 04/29/2018 2:45 PM Medical Record Number: [id number] Patient Account Number: [id number] Date of Birth/Sex: 15-Aug-1929 (85 y.o. M) Treating RN: Montey Hora Primary Care Deleah Tison: Nobie Putnam Other Clinician: Referring Gatlyn Lipari: Nobie Putnam Treating Dameshia Seybold/Extender: Tito Dine in Treatment: 73 Active Inactive Orientation to the Wound Care Program Nursing Diagnoses: Knowledge deficit related to the wound healing center program Goals: Patient/caregiver will verbalize understanding of the [place] Program Date Initiated: 12/24/2017 Target Resolution Date: 01/24/2018 Goal Status: Active Interventions: Provide education on orientation to the wound center Notes: Wound/Skin Impairment Nursing Diagnoses: Impaired tissue  integrity Knowledge deficit related to ulceration/compromised skin integrity Goals: Ulcer/skin breakdown will have a volume reduction of 30% by week 4 Date Initiated: 12/24/2017 Target Resolution Date: 01/24/2018 Goal Status: Active Interventions: Provide education on ulcer and skin care Treatment Activities: Topical wound management initiated : 12/24/2017 Notes: Electronic Signature(s) Signed: 04/29/2018 5:48:26 PM By: Montey Hora Entered By: Montey Hora on 04/29/2018 15:11:44 Raymond Barnett ([id number]) -------------------------------------------------------------------------------- Pain Assessment Details Patient Name: Raymond Barnett Date of Service: 04/29/2018 2:45 PM Medical Record Number: [id number] Patient Account Number: [id number] Date of Birth/Sex: 30-Aug-1929 (85 y.o. M) Treating RN: Montey Hora Primary Care Lemuel Boodram: Nobie Putnam Other Clinician: Referring Lexia Vandevender: Nobie Putnam Treating Kymere Fullington/Extender: Tito Dine in Treatment: 74 Active Problems Location of Pain Severity and Description of Pain Patient Has Paino No Site Locations Pain Management and Medication Current Pain Management: Electronic Signature(s) Signed: 04/29/2018 4:30:10 PM By: Paulla Fore, RRT, CHT Signed: 04/29/2018 5:48:26 PM By: Montey Hora Entered By: Lorine Bears on 04/29/2018 14:49:43 Raymond Barnett ([id number]) -------------------------------------------------------------------------------- Patient/Caregiver Education  Details Patient Name: RONNE, STEFANSKI Date of Service: 04/29/2018 2:45 PM Medical Record Number: [id number] Patient Account Number: [id number] Date of Birth/Gender: July 12, 1929 (85 y.o. M) Treating RN: Montey Hora Primary Care Physician: Nobie Putnam Other Clinician: Referring Physician: Nobie Putnam Treating Physician/Extender: Tito Dine in Treatment: 69 Education  Assessment Education Provided To: Patient and Caregiver Education Topics Provided Wound/Skin Impairment: Handouts: Other: wound care as ordered Methods: Demonstration, Explain/Verbal Responses: State content correctly Electronic Signature(s) Signed: 04/29/2018 5:48:26 PM By: Montey Hora Entered By: Montey Hora on 04/29/2018 15:17:47 Raymond Barnett ([id number]) -------------------------------------------------------------------------------- Wound Assessment Details Patient Name: Raymond Barnett Date of Service: 04/29/2018 2:45 PM Medical Record Number: [id number] Patient Account Number: [id number] Date of Birth/Sex: Sep 11, 1929 (85 y.o. M) Treating RN: Secundino Ginger Primary Care Zadrian Mccauley: Nobie Putnam Other Clinician: Referring Shawntay Prest: Nobie Putnam Treating Shetara Launer/Extender: Tito Dine in Treatment: 18 Wound Status Wound Number: 1 Primary Venous Leg Ulcer Etiology: Wound Location: Right Lower Leg - Lateral Wound Open Wounding Event: Other Lesion Status: Date Acquired: 11/20/2017 Comorbid Glaucoma, Hypertension, Peripheral Venous Weeks Of Treatment: 18 History: Disease, Rheumatoid Arthritis Clustered Wound: No Photos Photo Uploaded By: Secundino Ginger on 04/29/2018 15:06:09 Wound Measurements Length: (cm) 0.8 Width: (cm) 0.6 Depth: (cm) 0.3 Area: (cm) 0.377 Volume: (cm) 0.113 % Reduction in Area: 40% % Reduction in Volume: 10.3% Epithelialization: None Tunneling: No Undermining: No Wound Description Full Thickness Without Exposed Support Foul Odo Classification: Structures Slough/F Wound Margin: Distinct, outline attached Exudate Small Amount: Exudate Type: Serous Exudate Color: amber r After Cleansing: No ibrino Yes Wound Bed Granulation Amount: None Present (0%) Exposed Structure Necrotic Amount: Small (1-33%) Fascia Exposed: No Necrotic Quality: Adherent Slough Fat Layer (Subcutaneous Tissue) Exposed: Yes Tendon Exposed:  No Muscle Exposed: No Joint Exposed: No Bone Exposed: No NAFTULA, DONAHUE. ([id number]) Periwound Skin Texture Texture Color No Abnormalities Noted: No No Abnormalities Noted: No Callus: No Atrophie Blanche: No Crepitus: No Cyanosis: No Excoriation: No Ecchymosis: No Induration: No Erythema: No Rash: No Hemosiderin Staining: No Scarring: No Mottled: No Pallor: No Moisture Rubor: No No Abnormalities Noted: No Dry / Scaly: No Temperature / Pain Maceration: Yes Temperature: No Abnormality Wound Preparation Ulcer Cleansing: Rinsed/Irrigated with Saline Topical Anesthetic Applied: Other: lidocaine 4%, Treatment Notes Wound #1 (Right, Lateral Lower Leg) Notes prisma and bordered foam dressing Electronic Signature(s) Signed: 04/29/2018 4:13:45 PM By: Secundino Ginger Entered By: Secundino Ginger on 04/29/2018 14:58:37 Raymond Barnett ([id number]) -------------------------------------------------------------------------------- [place] Details Patient Name: Raymond Barnett Date of Service: 04/29/2018 2:45 PM Medical Record Number: [id number] Patient Account Number: [id number] Date of Birth/Sex: [birth year] (85 y.o. M) Treating RN: Montey Hora Primary Care Camille Thau: Nobie Putnam Other Clinician: Referring Terrilee Dudzik: Nobie Putnam Treating Deyonte Cadden/Extender: Tito Dine in Treatment: 18 Vital Signs Time Taken: 14:49 Temperature (F): 97.9 Height (in): 70 Pulse (bpm): 81 Weight (lbs): 211 Respiratory Rate (breaths/min): 16 Body Mass Index (BMI): 30.3 Blood Pressure (mmHg): 147/75 Reference Range: 80 - 120 mg / dl Electronic Signature(s) Signed: 04/29/2018 4:13:45 PM By: Secundino Ginger Entered BySecundino Ginger on 04/29/2018 14:53:19

## 2018-04-30 NOTE — Progress Notes (Signed)
Barnett, Raymond ([id number]) Visit Report for 04/29/2018 Debridement Details Patient Name: Raymond Barnett, Raymond Barnett Date of Service: 04/29/2018 2:45 PM Medical Record Number: [id number] Patient Account Number: [id number] Date of Birth/Sex: 05-28-1929 (85 y.o. M) Treating RN: Montey Hora Primary Care Provider: Nobie Putnam Other Clinician: Referring Provider: Nobie Putnam Treating Provider/Extender: Tito Dine in Treatment: 18 Debridement Performed for Wound #1 Right,Lateral Lower Leg Assessment: Performed By: Physician Ricard Dillon, MD Debridement Type: Debridement Severity of Tissue Pre Fat layer exposed Debridement: Level of Consciousness (Pre- Awake and Alert procedure): Pre-procedure Verification/Time Yes - 15:15 Out Taken: Start Time: 15:15 Pain Control: Lidocaine 4% Topical Solution Total Area Debrided (L x W): 0.8 (cm) x 0.6 (cm) = 0.48 (cm) Tissue and other material Non-Viable, Callus, Eschar debrided: Level: Non-Viable Tissue Debridement Description: Selective/Open Wound Instrument: Curette Bleeding: None End Time: 15:17 Procedural Pain: 0 Post Procedural Pain: 0 Response to Treatment: Procedure was tolerated well Level of Consciousness Awake and Alert (Post-procedure): Post Debridement Measurements of Total Wound Length: (cm) 0.8 Width: (cm) 0.6 Depth: (cm) 0.3 Volume: (cm) 0.113 Character of Wound/Ulcer Post Debridement: Improved Severity of Tissue Post Debridement: Fat layer exposed Post Procedure Diagnosis Same as Pre-procedure Electronic Signature(s) Signed: 04/29/2018 5:09:58 PM By: Linton Ham MD Signed: 04/29/2018 5:48:26 PM By: Montey Hora Entered By: Linton Ham on 04/29/2018 15:35:20 Raymond Barnett ([id number]) LAINE, GIOVANETTI ([id number]) -------------------------------------------------------------------------------- HPI Details Patient Name: Raymond Barnett Date of Service: 04/29/2018 2:45  PM Medical Record Number: [id number] Patient Account Number: [id number] Date of Birth/Sex: August 07, 1929 (85 y.o. M) Treating RN: Montey Hora Primary Care Provider: Nobie Putnam Other Clinician: Referring Provider: Nobie Putnam Treating Provider/Extender: Tito Dine in Treatment: 18 History of Present Illness Severity: furthermore the TBI was 0.33 on the right HPI Description: ADMISSION 12/24/17 This is an 85 year old man who arrives for review of wound on his right lateral ankle area. He states this happened about a year ago when he picked the scab off the area and the wound opened and never really healed. He has been followed by Dr. Governor Specking at [facility] wound care center. He is apparently used a multitude of topical dressings as well as skin substitutes however I'm not certain at this point with scattered skin substitutes were used. His last visit there was on 10/09/17 he's been using sorbact with hydrogel and a covering dressing. He states the wound is actually gotten better with less depth and wound volume. He is frustrated by the slowness of the healing and he is coming here for review. He also lives in [place] and this is a lot closer for him in terms of traveling. I have not had a chance to look through all of Dr. Samule Ohm notes however the wound was listed as a venous ulcer at one point. He had an x-ray done in December 2018 that did not show evidence of osteomyelitis. According to the patient osteomyelitis was ruled out. The patient actually has a complex history in this area. He suffered a serious fracture in the area perhaps 60 years ago while he was in the [employer] when [employer] over his ankle. He had a multitude of surgeries, bone graft and ultimately an ankle fusion. Apparently all the hardware however has been taken out of the ankle quite a while ago. He does not however have a prior wound history. The patient also saw his  cardiologist in May who appropriately ordered vascular tests. This showed an ABI in the right of 0.6 on  the left of 1.07. This showed monophasic waveforms and all of the tibial vessels. 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 Also at 30-49% stenosis of the distal popliteal. furthermore TBI was 0.33 on the right which is markedly reduced. Looking at Dr. Marisue Humble Notes the patient refused to see an interventionalist 12/31/17; right lateral ankle wound.. He sees Dr. and his cardiologist this afternoon and they will discuss further peripheral artery evaluation. He is using Endoform. 01/07/18; right lateral ankle wound. He saw [name] was his cardiologist. He seems to be of the opinion that the patient has critical limb ischemia as a cause for nonhealing of this area. I certainly agree with that. Nevertheless the patient will not agree to an invasive test i.e. an angiogram. Therefore we are left with attempting wound care. We are using Endoform. His wife changes the dressings 01/21/18; right ankle wound just below the right lateral malleolus. Wound looks about the same. Surface does not look as bad as when he first came in here. There is a small divot a roughly 10:00 but I cannot feel bone. Using Endoform 02/04/18; right ankle wound just at the level of the right lateral malleolus. It would appear the area has expanded somewhat in length. 2 small open areas with the bridge of normal skin. There is no evidence of surrounding infection. He still is very reluctant to undergo any invasive vascular assessment. His ABI on the right was 0.6 with monophasic waveforms in all tibial vessels. He still does not want to see an interventional list 03/04/18; monthly visit/palliative visit. This is a patient who has an open area on the level of the right lateral malleolus. He is underlying trauma to the area and probably significant PAD. He does not want any vascular interventions in spite of efforts by myself and his  interventional cardiologist [name]. He states he is improved he thinks the wound is smaller and is certainly less painful 04/01/18 Patient I see on a palliative basis. He has a small open area at the level of the right lateral malleolus. There is undermining superiorly of roughly 0.4 cm. I think this is an ischemic wound but he does not want any interventions 04/29/2018; patient I see on a monthly palliative basis. He has small open areas at the level of the right lateral malleolus. We have been using silver collagen. Some improvement, there is no longer undermining. The wound may have developed into 2 small open areas separated by a bridge of epithelialization SAVYON, LOKEN ([id number]) Electronic Signature(s) Signed: 04/29/2018 5:09:58 PM By: Linton Ham MD Entered By: Linton Ham on 04/29/2018 15:36:12 Raymond Barnett ([id number]) -------------------------------------------------------------------------------- Physical Exam Details Patient Name: Raymond Barnett Date of Service: 04/29/2018 2:45 PM Medical Record Number: [id number] Patient Account Number: [id number] Date of Birth/Sex: February 19, 1930 (85 y.o. M) Treating RN: Montey Hora Primary Care Provider: Nobie Putnam Other Clinician: Referring Provider: Nobie Putnam Treating Provider/Extender: Tito Dine in Treatment: 18 Constitutional Sitting or standing Blood Pressure is within target range for patient.. Pulse regular and within target range for patient.Marland Kitchen Respirations regular, non-labored and within target range.. Temperature is normal and within the target range for the patient.Marland Kitchen appears in no distress. Notes Wound exam; wound is on the lateral malleolus on the right. This is now 2 open areas. The superior part was covered by eschar and callus I remove this. There is still a wound underneath this. Is more major inferior wound no longer has undermining and the surface here looks reasonably  healthy, this is an improvement. No evidence of surrounding infection Electronic Signature(s) Signed: 04/29/2018 5:09:58 PM By: Linton Ham MD Entered By: Linton Ham on 04/29/2018 15:37:26 Raymond Barnett ([id number]) -------------------------------------------------------------------------------- Physician Orders Details Patient Name: Raymond Barnett Date of Service: 04/29/2018 2:45 PM Medical Record Number: [id number] Patient Account Number: [id number] Date of Birth/Sex: June 20, 1929 (85 y.o. M) Treating RN: Montey Hora Primary Care Provider: Nobie Putnam Other Clinician: Referring Provider: Nobie Putnam Treating Provider/Extender: Tito Dine in Treatment: 7 Verbal / Phone Orders: No Diagnosis Coding Wound Cleansing Wound #1 Right,Lateral Lower Leg o Clean wound with Normal Saline. Anesthetic (add to Medication List) Wound #1 Right,Lateral Lower Leg o Topical Lidocaine 4% cream applied to wound bed prior to debridement (In Clinic Only). Primary Wound Dressing Wound #1 Right,Lateral Lower Leg o Silver Collagen Secondary Dressing Wound #1 Right,Lateral Lower Leg o Boardered Foam Dressing Dressing Change Frequency Wound #1 Right,Lateral Lower Leg o Change Dressing Monday, Wednesday, Friday Follow-up Appointments Wound #1 Right,Lateral Lower Leg o Return Appointment in 1 month Edema Control Wound #1 Right,Lateral Lower Leg o Elevate legs to the level of the heart and pump ankles as often as possible Electronic Signature(s) Signed: 04/29/2018 5:09:58 PM By: Linton Ham MD Signed: 04/29/2018 5:48:26 PM By: Montey Hora Entered By: Montey Hora on 04/29/2018 15:16:14 Raymond Barnett ([id number]) -------------------------------------------------------------------------------- Problem List Details Patient Name: Raymond Barnett Date of Service: 04/29/2018 2:45 PM Medical Record Number: [id number] Patient Account Number:  [id number] Date of Birth/Sex: 01-May-1929 (85 y.o. 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 M) Treating RN: Montey Hora Primary Care Provider: Nobie Putnam Other Clinician: Referring Provider: Nobie Putnam Treating Provider/Extender: Tito Dine in Treatment: 18 Subjective History of Present Illness (HPI) The following HPI elements were documented for the patient's wound: Severity: furthermore the TBI was 0.33 on the right ADMISSION 12/24/17 This is an 85 year old man who arrives for review of wound on his right lateral ankle area. He states this happened about a year ago when he picked the scab off the area and the wound opened and never really healed. He has been followed by Dr. Governor Specking at [facility] wound care center. He is apparently used a multitude of topical dressings as well as skin substitutes however I'm not certain at this point with scattered skin substitutes were used. His last visit there was on 10/09/17 he's been using  sorbact with hydrogel and a covering dressing. He states the wound is actually gotten better with less depth and wound volume. He is frustrated by the slowness of the healing and he is coming here for review. He also lives in [place] and this is a lot closer for him in terms of traveling. I have not had a chance to look through all of Dr. Samule Ohm notes however the wound was listed as a  venous ulcer at one point. He had an x-ray done in December 2018 that did not show evidence of osteomyelitis. According to the patient osteomyelitis was ruled out. The patient actually has a complex history in this area. He suffered a serious fracture in the area perhaps 60 years ago while he was in the [employer] when [employer] over his ankle. He had a multitude of surgeries, bone graft and ultimately an ankle fusion. Apparently all the hardware however has been taken out of the ankle quite a while ago. He does not however have a prior wound history. The patient also saw his cardiologist in May who appropriately ordered vascular tests. This showed an ABI in the right of 0.6 on the left of 1.07. This showed monophasic waveforms and all of the tibial vessels. 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 Also at 30-49% stenosis of the distal popliteal. furthermore TBI was 0.33 on the right which is markedly reduced. Looking at Dr. Marisue Humble Notes the patient refused to see an interventionalist 12/31/17; right lateral ankle wound.. He sees Dr. and his cardiologist this afternoon and they will discuss further peripheral artery evaluation. He is using Endoform. 01/07/18; right lateral ankle wound. He saw [name] was his cardiologist. He seems to be of the opinion that the patient has critical limb ischemia as a cause for nonhealing of this area. I certainly agree with that. Nevertheless the patient will not agree to an invasive test i.e. an angiogram. Therefore we are left with attempting wound care. We are using Endoform. His  wife changes the dressings 01/21/18; right ankle wound just below the right lateral malleolus. Wound looks about the same. Surface does not look as bad as when he first came in here. There is a small divot a roughly 10:00 but I cannot feel bone. Using Endoform 02/04/18; right ankle wound just at the level of the right lateral malleolus. It would appear the area has expanded somewhat in length. 2 small open areas with the bridge of normal skin. There is no evidence of surrounding infection. He still is very reluctant to undergo any invasive vascular assessment. His ABI on the right was 0.6 with monophasic waveforms in all tibial vessels. He still does not want to see an interventional list 03/04/18; monthly visit/palliative visit. This is a patient who has an open area on the level of the right lateral malleolus. He is underlying trauma to the area and probably significant PAD. He does not want any vascular interventions in spite of efforts by myself and his interventional cardiologist [name]. He states he is improved he thinks the wound is smaller and is certainly less painful 04/01/18 Patient I see on a palliative basis. He has a small open area at the level of the right lateral malleolus. There is undermining superiorly of roughly 0.4 cm. I think this is an ischemic wound but he does not want any interventions 04/29/2018; patient I see on a monthly palliative basis. He has small open areas at the level of the right lateral malleolus. We VEASNA, SANTIBANEZ ([id number]) have been using silver collagen. Some improvement, there is no longer undermining. The wound may have developed into 2 small open areas separated by a bridge of epithelialization Objective Constitutional Sitting or standing Blood Pressure is within target range for patient.. Pulse regular and within target range for patient.Marland Kitchen Respirations regular, non-labored and within target range.. Temperature is normal and within the target range  for the patient.Marland Kitchen appears in no distress. Vitals Time  Taken: 2:49 PM, Height: 70 in, Weight: 211 lbs, BMI: 30.3, Temperature: 97.9 F, Pulse: 81 bpm, Respiratory Rate: 16 breaths/min, Blood Pressure: 147/75 mmHg. General Notes: Wound exam; wound is on the lateral malleolus on the right. This is now 2 open areas. The superior part was covered by eschar and callus I remove this. There is still a wound underneath this. Is more major inferior wound no longer has undermining and the surface here looks reasonably healthy, this is an improvement. No evidence of surrounding infection Integumentary (Hair, Skin) Wound #1 status is Open. Original cause of wound was Other Lesion. The wound is located on the Right,Lateral Lower Leg. The wound measures 0.8cm length x 0.6cm width x 0.3cm depth; 0.377cm^2 area and 0.113cm^3 volume. There is Fat Layer (Subcutaneous Tissue) Exposed exposed. There is no tunneling or undermining noted. There is a small amount of serous drainage noted. The wound margin is distinct with the outline attached to the wound base. There is no granulation within the wound bed. There is a small (1-33%) amount of necrotic tissue within the wound bed including Adherent Slough. The periwound skin appearance exhibited: Maceration. The periwound skin appearance did not exhibit: Callus, Crepitus, Excoriation, Induration, Rash, Scarring, Dry/Scaly, Atrophie Blanche, Cyanosis, Ecchymosis, Hemosiderin Staining, Mottled, Pallor, Rubor, Erythema. Periwound temperature was noted as No Abnormality. 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 There was a Selective/Open Wound Non-Viable Tissue  Debridement with a total area of ARVLE, GRABE. ([id number]) 0.48 sq cm performed by Ricard Dillon, MD. With the following instrument(s): Curette to remove Non-Viable tissue/material. Material removed includes Eschar and Callus and after achieving pain control using Lidocaine 4% Topical Solution. No specimens were taken. A time out was conducted at 15:15, prior to the start of the procedure. There was no bleeding. The procedure was tolerated well with a pain level of 0 throughout and a pain level of 0 following the procedure. Post Debridement Measurements: 0.8cm length x 0.6cm width x 0.3cm depth; 0.113cm^3 volume. Character of Wound/Ulcer Post Debridement is improved. Severity of Tissue Post Debridement is: Fat layer exposed. Post procedure Diagnosis Wound #1: Same as Pre-Procedure Plan Wound Cleansing: Wound #1 Right,Lateral Lower Leg: Clean wound with Normal Saline. Anesthetic (add to Medication List): Wound #1 Right,Lateral Lower Leg: Topical Lidocaine 4% cream applied to wound bed prior to debridement (In Clinic Only). Primary Wound Dressing: Wound #1 Right,Lateral Lower Leg: Silver Collagen Secondary Dressing: Wound #1 Right,Lateral Lower Leg: Boardered Foam Dressing Dressing Change Frequency: Wound #1 Right,Lateral Lower Leg: Change Dressing Monday, Wednesday, Friday Follow-up Appointments: Wound #1 Right,Lateral Lower Leg: Return Appointment in 1 month Edema Control: Wound #1 Right,Lateral Lower Leg: Elevate legs to the level of the heart and pump ankles as often as possible 1. Continue with silver collagen moistened with saline or K-Y jelly. 2. The patient continues to have absence of peripheral pulses in the foot. Very minimal popliteal pulses on the right. He does not really describe claudication but he does have limitation secondary to back pain or perhaps general fatigue. 3. The patient has not agreed to a more aggressive vascular work-up. He has not changed his  opinion on this Electronic Signature(s) Signed: 04/29/2018 5:09:58 PM By: Linton Ham MD Entered By: Dellia Nims,  Serafina Topham on 04/29/2018 15:40:28 LAWRANCE, WIEDEMANN ([id number]) -------------------------------------------------------------------------------- SuperBill Details Patient Name: CORBETT, MOULDER Date of Service: 04/29/2018 Medical Record Number: [id number] Patient Account Number: [id number] Date of Birth/Sex: 19-May-1929 (85 y.o. 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

## 2018-05-18 ENCOUNTER — Other Ambulatory Visit: Payer: Self-pay

## 2018-05-18 DIAGNOSIS — I10 Essential (primary) hypertension: Secondary | ICD-10-CM

## 2018-05-18 DIAGNOSIS — R739 Hyperglycemia, unspecified: Secondary | ICD-10-CM

## 2018-05-18 DIAGNOSIS — E785 Hyperlipidemia, unspecified: Secondary | ICD-10-CM

## 2018-05-18 DIAGNOSIS — I739 Peripheral vascular disease, unspecified: Secondary | ICD-10-CM

## 2018-05-18 DIAGNOSIS — D696 Thrombocytopenia, unspecified: Secondary | ICD-10-CM

## 2018-05-18 DIAGNOSIS — N401 Enlarged prostate with lower urinary tract symptoms: Secondary | ICD-10-CM

## 2018-05-18 DIAGNOSIS — R350 Frequency of micturition: Principal | ICD-10-CM

## 2018-05-19 ENCOUNTER — Other Ambulatory Visit: Payer: Medicare Other

## 2018-05-19 DIAGNOSIS — I10 Essential (primary) hypertension: Secondary | ICD-10-CM | POA: Diagnosis not present

## 2018-05-19 DIAGNOSIS — N401 Enlarged prostate with lower urinary tract symptoms: Secondary | ICD-10-CM | POA: Diagnosis not present

## 2018-05-19 DIAGNOSIS — E785 Hyperlipidemia, unspecified: Secondary | ICD-10-CM | POA: Diagnosis not present

## 2018-05-19 DIAGNOSIS — R350 Frequency of micturition: Secondary | ICD-10-CM | POA: Diagnosis not present

## 2018-05-19 DIAGNOSIS — D696 Thrombocytopenia, unspecified: Secondary | ICD-10-CM | POA: Diagnosis not present

## 2018-05-19 DIAGNOSIS — I739 Peripheral vascular disease, unspecified: Secondary | ICD-10-CM | POA: Diagnosis not present

## 2018-05-19 DIAGNOSIS — R739 Hyperglycemia, unspecified: Secondary | ICD-10-CM | POA: Diagnosis not present

## 2018-05-20 LAB — CBC WITH DIFFERENTIAL/PLATELET
Absolute Monocytes: 418 cells/uL (ref 200–950)
Basophils Absolute: 22 cells/uL (ref 0–200)
Basophils Relative: 0.4 %
EOS PCT: 2.5 %
Eosinophils Absolute: 138 cells/uL (ref 15–500)
HEMATOCRIT: 36.5 % — AB (ref 38.5–50.0)
HEMOGLOBIN: 12.2 g/dL — AB (ref 13.2–17.1)
LYMPHS ABS: 1326 {cells}/uL (ref 850–3900)
MCH: 32.5 pg (ref 27.0–33.0)
MCHC: 33.4 g/dL (ref 32.0–36.0)
MCV: 97.3 fL (ref 80.0–100.0)
MPV: 12.4 fL (ref 7.5–12.5)
Monocytes Relative: 7.6 %
Neutro Abs: 3597 cells/uL (ref 1500–7800)
Neutrophils Relative %: 65.4 %
Platelets: 143 10*3/uL (ref 140–400)
RBC: 3.75 10*6/uL — ABNORMAL LOW (ref 4.20–5.80)
RDW: 16.2 % — ABNORMAL HIGH (ref 11.0–15.0)
Total Lymphocyte: 24.1 %
WBC: 5.5 10*3/uL (ref 3.8–10.8)

## 2018-05-20 LAB — COMPLETE METABOLIC PANEL WITH GFR
AG Ratio: 1.5 (calc) (ref 1.0–2.5)
ALT: 15 U/L (ref 9–46)
AST: 20 U/L (ref 10–35)
Albumin: 3.8 g/dL (ref 3.6–5.1)
Alkaline phosphatase (APISO): 49 U/L (ref 40–115)
BUN: 15 mg/dL (ref 7–25)
CO2: 26 mmol/L (ref 20–32)
Calcium: 9.4 mg/dL (ref 8.6–10.3)
Chloride: 105 mmol/L (ref 98–110)
Creat: 0.83 mg/dL (ref 0.70–1.11)
GFR, Est African American: 92 mL/min/{1.73_m2} (ref >=60)
GFR, Est Non African American: 80 mL/min/{1.73_m2} (ref >=60)
Globulin: 2.5 g/dL (calc) (ref 1.9–3.7)
Glucose, Bld: 106 mg/dL — ABNORMAL HIGH (ref 65–99)
Potassium: 4.4 mmol/L (ref 3.5–5.3)
Sodium: 139 mmol/L (ref 135–146)
TOTAL PROTEIN: 6.3 g/dL (ref 6.1–8.1)
Total Bilirubin: 1 mg/dL (ref 0.2–1.2)

## 2018-05-20 LAB — LIPID PANEL
Cholesterol: 151 mg/dL (ref <200)
HDL: 44 mg/dL (ref >40)
LDL Cholesterol (Calc): 90 mg/dL (calc)
Non-HDL Cholesterol (Calc): 107 mg/dL (calc) (ref <130)
Total CHOL/HDL Ratio: 3.4 (calc) (ref <5.0)
Triglycerides: 81 mg/dL (ref <150)

## 2018-05-20 LAB — HEMOGLOBIN A1C
Hgb A1c MFr Bld: 5 % of total Hgb (ref <5.7)
Mean Plasma Glucose: 97 (calc)
eAG (mmol/L): 5.4 (calc)

## 2018-05-20 LAB — PSA: PSA: 0.3 ng/mL (ref <=4.0)

## 2018-05-26 ENCOUNTER — Ambulatory Visit (INDEPENDENT_AMBULATORY_CARE_PROVIDER_SITE_OTHER): Payer: Medicare Other | Admitting: Family Medicine

## 2018-05-26 ENCOUNTER — Encounter: Payer: Self-pay | Admitting: Family Medicine

## 2018-05-26 ENCOUNTER — Ambulatory Visit (INDEPENDENT_AMBULATORY_CARE_PROVIDER_SITE_OTHER): Payer: Medicare Other

## 2018-05-26 VITALS — BP 118/58 | HR 71 | Temp 97.9°F | Resp 16 | Ht 70.0 in | Wt 209.2 lb

## 2018-05-26 VITALS — BP 118/58 | HR 71 | Temp 97.9°F | Resp 16 | Ht 70.0 in | Wt 209.6 lb

## 2018-05-26 DIAGNOSIS — I429 Cardiomyopathy, unspecified: Secondary | ICD-10-CM

## 2018-05-26 DIAGNOSIS — D696 Thrombocytopenia, unspecified: Secondary | ICD-10-CM

## 2018-05-26 DIAGNOSIS — M545 Low back pain: Secondary | ICD-10-CM

## 2018-05-26 DIAGNOSIS — G8929 Other chronic pain: Secondary | ICD-10-CM | POA: Diagnosis not present

## 2018-05-26 DIAGNOSIS — J438 Other emphysema: Secondary | ICD-10-CM | POA: Diagnosis not present

## 2018-05-26 DIAGNOSIS — J3089 Other allergic rhinitis: Secondary | ICD-10-CM | POA: Diagnosis not present

## 2018-05-26 DIAGNOSIS — M5136 Other intervertebral disc degeneration, lumbar region: Secondary | ICD-10-CM

## 2018-05-26 DIAGNOSIS — Z0000 Encounter for general adult medical examination without abnormal findings: Secondary | ICD-10-CM | POA: Diagnosis not present

## 2018-05-26 DIAGNOSIS — I739 Peripheral vascular disease, unspecified: Secondary | ICD-10-CM

## 2018-05-26 DIAGNOSIS — I5032 Chronic diastolic (congestive) heart failure: Secondary | ICD-10-CM

## 2018-05-26 MED ORDER — MELOXICAM 15 MG PO TABS: 15.0000 mg | ORAL_TABLET | Freq: Every day | ORAL | 2 refills | Status: DC

## 2018-05-26 NOTE — Assessment & Plan Note (Signed)
Persistent rhinitis, affecting post nasal drainage and some hoarse voice Worse with recent URI symptoms, now improved Limited benefit on atrovent currently only temporary relief  Plan RESTART DAILY Flonase, need to adhere to this >4-6 weeks and then likely need for longer Future if not improving or worsening hoarse voice, we can consider refer to ENT

## 2018-05-26 NOTE — Assessment & Plan Note (Signed)
See A&P for lumbar DDD

## 2018-05-26 NOTE — Assessment & Plan Note (Signed)
PAD on prior ABI Followed by [facility] Dr End  He has declined angiography and catheterization. Also has self discontinued Rosuvastatin, Metoprolol.  Discussed recommendations for continuing these medicines are to treat these problems and prevent future issue, but he declines this intervention. Advised him to follow-up discussion with Dr End. 

## 2018-05-26 NOTE — Assessment & Plan Note (Signed)
Without flare Clinically stable emphysema on prior imaging former smoker Not on maintenance therapy - declines Follow-up PRN in future if flare, we can always reconsider maintenance and more aggressive treatment, or refer to [facility]

## 2018-05-26 NOTE — Patient Instructions (Addendum)
Thank you for coming to the office today.  Restart Flonase 2 sprays in each nostril every day for next at least 4-6 weeks possibly longer for drainage  If still hoarse voice by 6 weeks, call me and we can refer you to a ENT specialist.  Please call [place] office to see if they can schedule you to follow-up on your back pain, and ultimately you may need a referral to a Pain Specialist.  Facet arthritis of lumbar region ([place])   [address], [address], [place] [address]  Phone: [phone]     ---------------------------  Stop Naproxen  Start Meloxicam 15mg  daily with food for 2-4 weeks then as needed  ---------  1. Chemistry - Normal results, including electrolytes, kidney and liver function. Slightly elevated fasting blood sugar   2. Hemoglobin A1c (Diabetes screening) - 5.0, normal not in range of Pre-Diabetes (>5.7 to 6.4)   3. PSA Prostate Cancer Screening - 0.3, negative.  4. Cholesterol - Normal cholesterol results. Controlled on low dose Rosuvastatin 5mg  daily  5. CBC Blood Counts - Slightly low Hemoglobin 12.2, consistent with mild anemia. No other significant abnormality   Please schedule a Follow-up Appointment to: Return in about 6 months (around 11/24/2018) for 6 months - follow-up.  If you have any other questions or concerns, please feel free to call the office or send a message through [facility]. You may also schedule an earlier appointment if necessary.  Additionally, you may be receiving a survey about your experience at our office within a few days to 1 week by e-mail or mail. We value your feedback.  Nobie Putnam, DO [place]

## 2018-05-26 NOTE — Progress Notes (Signed)
Subjective:   Raymond Barnett is a 85 y.o. male who presents for Medicare Annual/Subsequent preventive examination.  Review of Systems:   Cardiac Risk Factors include: advanced age (>15mn, >>71women);dyslipidemia;hypertension;male gender;obesity (BMI >30kg/m2)     Objective:    Vitals: BP (!) 118/58   Pulse 71   Temp 97.9 F (36.6 C) (Oral)   Resp 16   Ht _0  (1.778 m)   Wt 209 lb 2.6 oz (94.9 kg)   BMI 30.01 kg/m   Body mass index is 30.01 kg/m.  Advanced Directives 05/26/2018 05/06/2017 08/14/2016 02/02/2016 02/03/2015  Does Patient Have a Medical Advance Directive? _1   Does patient want to make changes to medical advance directive? - Yes (MAU/Ambulatory/Procedural Areas - Information given) - - -  Would patient like information on creating a medical advance directive? No - Patient declined - No - Patient declined No - patient declined information No - patient declined information    Tobacco Social History   Tobacco Use  Smoking Status Former Smoker  . Packs/day: 1.00  . Years: 30.00  . Pack years: 30.00  . Types: Cigarettes  . Last attempt to quit: 1995  . Years since quitting: 25.1  Smokeless Tobacco Former UEngineer, structuralgiven: Not Answered   Clinical Intake:  Pre-visit preparation completed: Yes  Pain : No/denies pain     Nutritional Status: BMI > 30  Obese Nutritional Risks: None Diabetes: No  How often do you need to have someone help you when you read instructions, pamphlets, or other written materials from your doctor or pharmacy?: 1 - Never What is the last grade level you completed in school?: high school  Interpreter Needed?: No  Information entered by :: Lucia Mccreadie,LPN  Past Medical History:  Diagnosis Date  . Arthritis   . Benign prostate hyperplasia    lower urinary tract symptoms  . Cataracts, bilateral   . DDD (degenerative disc disease), lumbar   . Glaucoma   . H/O carpal tunnel syndrome   . History of  osteomyelitis 1953   right leg  . Hypertension   . Thrombocytopenia ([facility]    Past Surgical History:  Procedure Laterality Date  . CARPAL TUNNEL RELEASE Left   . CARPAL TUNNEL RELEASE Right   . COLONOSCOPY  2015   Dr. WAllen Norris . LEG SURGERY Right 1953  . SKIN GRAFT Right 05/01/2017   [facility] . TOTAL KNEE ARTHROPLASTY Right    Family History  Problem Relation Age of Onset  . Colon cancer Mother 545 . Thrombosis Father 635 . Heart disease Father        Heart valve problem  . AAA (abdominal aortic aneurysm) Father   . Hypertension Sister   . Hyperlipidemia Sister   . Hypertension Sister   . Hyperlipidemia Sister   . Hypertension Sister   . Hyperlipidemia Sister    Social History   Socioeconomic History  . Marital status: Married    Spouse name: Not on file  . Number of children: Not on file  . Years of education: Not on file  . Highest education level: High school graduate  Occupational History  . Not on file  Social Needs  . Financial resource strain: Not hard at all  . Food insecurity:    Worry: Never true    Inability: Never true  . Transportation needs:    Medical: No    Non-medical: No  Tobacco  Use  . Smoking status: Former Smoker    Packs/day: 1.00    Years: 30.00    Pack years: 30.00    Types: Cigarettes    Last attempt to quit: 1995    Years since quitting: 25.1  . Smokeless tobacco: Former Network engineer and Sexual Activity  . Alcohol use: Not Currently    Frequency: Never    Comment: occasionally wine   . Drug use: No  . Sexual activity: Not Currently  Lifestyle  . Physical activity:    Days per week: 0 days    Minutes per session: 0 min  . Stress: Not at all  Relationships  . Social connections:    Talks on phone: Twice a week    Gets together: More than three times a week    Attends religious service: More than 4 times per year    Active member of club or organization: No    Attends meetings of clubs or organizations: Never      Relationship status: Married  Other Topics Concern  . Not on file  Social History Narrative  . Not on file    Outpatient Encounter Medications as of 05/26/2018  Medication Sig  . aspirin 81 MG tablet Take 1 tablet by mouth daily.  . Calcium Carb-Cholecalciferol (CALCIUM 600 + D) 600-200 MG-UNIT TABS Take 1 tablet by mouth daily. Patient takes 1000 mg daily.  . Cholecalciferol (VITAMIN D3) 1000 units CAPS   . finasteride (PROSCAR) 5 MG tablet Take 5 mg by mouth daily.  . fluticasone (FLONASE) 50 MCG/ACT nasal spray Place 2 sprays into both nostrils daily. Use for 4-6 weeks then stop and use seasonally or as needed.  . Lidocaine-Hydrocortisone Ace 2-2 % KIT Use rectal suppository twice daily as needed for up to 1 week for hemorrhoids  . lisinopril (PRINIVIL,ZESTRIL) 5 MG tablet Take 1 tablet (5 mg total) by mouth daily.  Marland Kitchen loratadine (CLARITIN) 10 MG tablet TAKE 1 TABLET BY MOUTH DAILY  . timolol (TIMOPTIC) 0.5 % ophthalmic solution 1 drop 2 (two) times daily.  Marland Kitchen ULTRA FRESH 0.5 % SOLN    No facility-administered encounter medications on file as of 05/26/2018.     Activities of Daily Living In your present state of health, do you have any difficulty performing the following activities: 05/26/2018  Hearing? N  Vision? N  Difficulty concentrating or making decisions? N  Walking or climbing stairs? Y  Comment sob   Dressing or bathing? Y  Comment sometimes , takes a while   Doing errands, shopping? N  Preparing Food and eating ? N  Using the Toilet? N  In the past six months, have you accidently leaked urine? N  Do you have problems with loss of bowel control? N  Managing your Medications? N  Managing your Finances? N  Housekeeping or managing your Housekeeping? N  Some recent data might be hidden    Patient Care Team: [name], DO as PCP - General (Family Medicine) Casimer Lanius, DPM (Podiatry) Felipa Eth, MD (Internal Medicine)   Assessment:    This is a routine wellness examination for Raymond Barnett.  Exercise Activities and Dietary recommendations Current Exercise Habits: The patient does not participate in regular exercise at present, Exercise limited by: None identified  Goals    . DIET - INCREASE WATER INTAKE     Recommend drinking at least 6-8 glasses of water a day        Fall Risk Fall Risk  05/26/2018 04/17/2018  12/17/2017 09/16/2017 05/06/2017  Falls in the past year? 0 0 No No No  Number falls in past yr: 0 - - - -  Risk for fall due to : - - - - -   FALL RISK PREVENTION PERTAINING TO THE HOME:  Any stairs in or around the home WITH handrails? no stairs Home free of loose throw rugs in walkways, pet beds, electrical cords, etc? Yes  Adequate lighting in your home to reduce risk of falls? Yes   ASSISTIVE DEVICES UTILIZED TO PREVENT FALLS:  Life alert? No  Use of a cane, walker or w/c? Yes  Grab bars in the bathroom? Yes  Shower chair or bench in shower? No  Elevated toilet seat or a handicapped toilet? No   DME ORDERS:  DME order needed?  No   TIMED UP AND GO:  Was the test performed? Yes .  Length of time to ambulate 10 feet: 11 sec.   GAIT:  Appearance of gait: Gait steady with the use of an assistive device.  Education: Fall risk prevention has been discussed.  Intervention(s) required? No    Depression Screen PHQ 2/9 Scores 05/26/2018 05/26/2018 04/17/2018 12/17/2017  PHQ - 2 Score 0 0 0 0  PHQ- 9 Score - - - -    Cognitive Function     6CIT Screen 05/26/2018 05/06/2017  What Year? 0 points 0 points  What month? 0 points 0 points  What time? 0 points 0 points  Count back from 20 0 points 0 points  Months in reverse 0 points 0 points  Repeat phrase 0 points 2 points  Total Score 0 2    Immunization History  Administered Date(s) Administered  . Influenza Whole 01/24/2010  . Influenza, High Dose Seasonal PF 01/23/2016, 01/17/2017, 01/21/2018  . Influenza,inj,Quad PF,6+ Mos 02/03/2015     Qualifies for Shingles Vaccine? Yes   Due for Shingrix. Education has been provided regarding the importance of this vaccine. Pt has been advised to call insurance company to determine out of pocket expense. Advised may also receive vaccine at local pharmacy or Health Dept. Verbalized acceptance and understanding.  Tdap: up to date  Flu Vaccine: up to date   Pneumococcal Vaccine: up to date   Screening Tests Health Maintenance  Topic Date Due  . TETANUS/TDAP  12/29/2022  . INFLUENZA VACCINE  Completed  . PNA vac Low Risk Adult  Completed   Cancer Screenings:  Colorectal Screening: no longer required  Lung Cancer Screening: (Low Dose CT Chest recommended if Age 60-80 years, 30 pack-year currently smoking OR have quit w/in 15years.) does not qualify.    Additional Screening:  Hepatitis C Screening: does not qualify  Vision Screening: Recommended annual ophthalmology exams for early detection of glaucoma and other disorders of the eye. Is the patient up to date with their annual eye exam?  Yes    Dental Screening: Recommended annual dental exams for proper oral hygiene  Community Resource Referral:  CRR required this visit?  No       Plan:    I have personally reviewed and addressed the Medicare Annual Wellness questionnaire and have noted the following in the patient's chart:  A. Medical and social history B. Use of alcohol, tobacco or illicit drugs  C. Current medications and supplements D. Functional ability and status E.  Nutritional status F.  Physical activity G. Advance directives H. List of other physicians I.  Hospitalizations, surgeries, and ER visits in previous 12 months J.  Vitals  K. Screenings such as hearing and vision if needed, cognitive and depression L. Referrals and appointments   In addition, I have reviewed and discussed with patient certain preventive protocols, quality metrics, and best practice recommendations. A written personalized  care plan for preventive services as well as general preventive health recommendations were provided to patient.   Signed,  Tyler Aas, LPN Nurse Health Advisor   Nurse Notes: none

## 2018-05-26 NOTE — Assessment & Plan Note (Signed)
Identified by Cardiology on ECHO Also some diastolic CHF Clinically some symptoms of dyspnea on exertion previously Also underlying mild COPD Followed by CVD  Dr End  Patient has discontinued statin and BB against medical advice Follow-up as planned

## 2018-05-26 NOTE — Patient Instructions (Addendum)
Raymond Barnett , Thank you for taking time to come for your Medicare Wellness Visit. I appreciate your ongoing commitment to your health goals. Please review the following plan we discussed and let me know if I can assist you in the future.   Screening recommendations/referrals: Colonoscopy: no longer required  Recommended yearly ophthalmology/optometry visit for glaucoma screening and checkup Recommended yearly dental visit for hygiene and checkup  Vaccinations: Influenza vaccine: completed 01/21/2018 Pneumococcal vaccine: completed series  Tdap vaccine: completed 12/28/2012 Shingles vaccine: shingrix eligible, check with your insurance company for coverage  Advanced directives: Advance directive discussed with you today. Even though you declined this today please call our office should you change your mind and we can give you the proper paperwork for you to fill out.  Conditions/risks identified: none   Next appointment: Follow up in one year for your annual wellness exam.   Preventive Care 65 Years and Older, Male Preventive care refers to lifestyle choices and visits with your health care provider that can promote health and wellness. What does preventive care include?  A yearly physical exam. This is also called an annual well check.  Dental exams once or twice a year.  Routine eye exams. Ask your health care provider how often you should have your eyes checked.  Personal lifestyle choices, including:  Daily care of your teeth and gums.  Regular physical activity.  Eating a healthy diet.  Avoiding tobacco and drug use.  Limiting alcohol use.  Practicing safe sex.  Taking low doses of aspirin every day.  Taking vitamin and mineral supplements as recommended by your health care provider. What happens during an annual well check? The services and screenings done by your health care provider during your annual well check will depend on your age, overall health, lifestyle  risk factors, and family history of disease. Counseling  Your health care provider may ask you questions about your:  Alcohol use.  Tobacco use.  Drug use.  Emotional well-being.  Home and relationship well-being.  Sexual activity.  Eating habits.  History of falls.  Memory and ability to understand (cognition).  Work and work Statistician. Screening  You may have the following tests or measurements:  Height, weight, and BMI.  Blood pressure.  Lipid and cholesterol levels. These may be checked every 5 years, or more frequently if you are over 68 years old.  Skin check.  Lung cancer screening. You may have this screening every year starting at age 31 if you have a 30-pack-year history of smoking and currently smoke or have quit within the past 15 years.  Fecal occult blood test (FOBT) of the stool. You may have this test every year starting at age 39.  Flexible sigmoidoscopy or colonoscopy. You may have a sigmoidoscopy every 5 years or a colonoscopy every 10 years starting at age 57.  Prostate cancer screening. Recommendations will vary depending on your family history and other risks.  Hepatitis C blood test.  Hepatitis B blood test.  Sexually transmitted disease (STD) testing.  Diabetes screening. This is done by checking your blood sugar (glucose) after you have not eaten for a while (fasting). You may have this done every 1-3 years.  Abdominal aortic aneurysm (AAA) screening. You may need this if you are a current or former smoker.  Osteoporosis. You may be screened starting at age 29 if you are at high risk. Talk with your health care provider about your test results, treatment options, and if necessary, the need for more  tests. Vaccines  Your health care provider may recommend certain vaccines, such as:  Influenza vaccine. This is recommended every year.  Tetanus, diphtheria, and acellular pertussis (Tdap, Td) vaccine. You may need a Td booster every 10  years.  Zoster vaccine. You may need this after age 66.  Pneumococcal 13-valent conjugate (PCV13) vaccine. One dose is recommended after age 62.  Pneumococcal polysaccharide (PPSV23) vaccine. One dose is recommended after age 22. Talk to your health care provider about which screenings and vaccines you need and how often you need them. This information is not intended to replace advice given to you by your health care provider. Make sure you discuss any questions you have with your health care provider. Document Released: 05/05/2015 Document Revised: 12/27/2015 Document Reviewed: 02/07/2015 Elsevier Interactive Patient Education  2017 [place] Prevention in the Home Falls can cause injuries. They can happen to people of all ages. There are many things you can do to make your home safe and to help prevent falls. What can I do on the outside of my home?  Regularly fix the edges of walkways and driveways and fix any cracks.  Remove anything that might make you trip as you walk through a door, such as a raised step or threshold.  Trim any bushes or trees on the path to your home.  Use bright outdoor lighting.  Clear any walking paths of anything that might make someone trip, such as rocks or tools.  Regularly check to see if handrails are loose or broken. Make sure that both sides of any steps have handrails.  Any raised decks and porches should have guardrails on the edges.  Have any leaves, snow, or ice cleared regularly.  Use sand or salt on walking paths during winter.  Clean up any spills in your garage right away. This includes oil or grease spills. What can I do in the bathroom?  Use night lights.  Install grab bars by the toilet and in the tub and shower. Do not use towel bars as grab bars.  Use non-skid mats or decals in the tub or shower.  If you need to sit down in the shower, use a plastic, non-slip stool.  Keep the floor dry. Clean up any water that  spills on the floor as soon as it happens.  Remove soap buildup in the tub or shower regularly.  Attach bath mats securely with double-sided non-slip rug tape.  Do not have throw rugs and other things on the floor that can make you trip. What can I do in the bedroom?  Use night lights.  Make sure that you have a light by your bed that is easy to reach.  Do not use any sheets or blankets that are too big for your bed. They should not hang down onto the floor.  Have a firm chair that has side arms. You can use this for support while you get dressed.  Do not have throw rugs and other things on the floor that can make you trip. What can I do in the kitchen?  Clean up any spills right away.  Avoid walking on wet floors.  Keep items that you use a lot in easy-to-reach places.  If you need to reach something above you, use a strong step stool that has a grab bar.  Keep electrical cords out of the way.  Do not use floor polish or wax that makes floors slippery. If you must use wax, use non-skid floor  wax.  Do not have throw rugs and other things on the floor that can make you trip. What can I do with my stairs?  Do not leave any items on the stairs.  Make sure that there are handrails on both sides of the stairs and use them. Fix handrails that are broken or loose. Make sure that handrails are as long as the stairways.  Check any carpeting to make sure that it is firmly attached to the stairs. Fix any carpet that is loose or worn.  Avoid having throw rugs at the top or bottom of the stairs. If you do have throw rugs, attach them to the floor with carpet tape.  Make sure that you have a light switch at the top of the stairs and the bottom of the stairs. If you do not have them, ask someone to add them for you. What else can I do to help prevent falls?  Wear shoes that:  Do not have high heels.  Have rubber bottoms.  Are comfortable and fit you well.  Are closed at the  toe. Do not wear sandals.  If you use a stepladder:  Make sure that it is fully opened. Do not climb a closed stepladder.  Make sure that both sides of the stepladder are locked into place.  Ask someone to hold it for you, if possible.  Clearly mark and make sure that you can see:  Any grab bars or handrails.  First and last steps.  Where the edge of each step is.  Use tools that help you move around (mobility aids) if they are needed. These include:  Canes.  Walkers.  Scooters.  Crutches.  Turn on the lights when you go into a dark area. Replace any light bulbs as soon as they burn out.  Set up your furniture so you have a clear path. Avoid moving your furniture around.  If any of your floors are uneven, fix them.  If there are any pets around you, be aware of where they are.  Review your medicines with your doctor. Some medicines can make you feel dizzy. This can increase your chance of falling. Ask your doctor what other things that you can do to help prevent falls. This information is not intended to replace advice given to you by your health care provider. Make sure you discuss any questions you have with your health care provider. Document Released: 02/02/2009 Document Revised: 09/14/2015 Document Reviewed: 05/13/2014 Elsevier Interactive Patient Education  2017 [employer].

## 2018-05-26 NOTE — Assessment & Plan Note (Signed)
Clinically stable, euvolemic, with preserved EF on last ECHO Cardiomyopathy, with PVD, PAD Followed by [facility] Cardiology Dr End  He has declined angiography and catheterization. Also has self discontinued Rosuvastatin, Metoprolol.  Discussed recommendations for continuing these medicines are to treat these problems and prevent future issue, but he declines this intervention. Advised him to follow-up discussion with Dr End.

## 2018-05-26 NOTE — Assessment & Plan Note (Signed)
Previously followed by [facility] Last result CBC stable in 2019, has been stable Asymptomatic currently

## 2018-05-26 NOTE — Progress Notes (Signed)
Subjective:    Patient ID: Raymond Barnett, male    DOB: [birth year], 85 y.o.   MRN: [id number]  Raymond Barnett is a 85 y.o. male presenting on 05/26/2018 for Hyperlipidemia   HPI   Patient is also scheduled for Annual Medicare Wellness today with other provider, [facility] LPN.  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 He has done better without significant breathing complication or flare up exac. He is not on maintenance therapy, not interested, and has declined to go to Pulmonology for formal PFT or other therapy - He is former smoker Denies any dyspnea, chest pain or pressure, productive cough, wheezing  Allergic Rhinitis / Hoarse Voice Last seen 04/17/18, he was treated with augmentin, atrovent and flonase at that time after few months of intermittent worse nasal congestion and sinus symptoms, and he improved overall but then had lingering post nasal drainage and hoarse voice, requiring a lot of throat clearing, he has stopped regular flonase only uses PRN now, uses atrovent PRN as well. Denies fever or chills, headache, hearing changes, nasal purulence  Chronic Low Back Pain / Lumbar DDD Known history lumbar DDD facet arthritis, prior compression fracture, spinal stenosis. Has been followed in past by [employer] and [facility] with injection therapy, med management, he has had limited benefits in past, has been lost to follow-up by them since >2018. - Today he complains of back pain still, similar to before, worse with overuse and activity, often he sits prolong most of time, and worse if tries to get up and has muscle stiffness. He has tried Gabapentin and muscle relaxant before baclofen, he believes made him drowsy. - Tried naproxen in past and failed, limited benefit, does not take regularly - He is asking for stronger pain medicine to take as needed - Denies radiating pain into legs,  numbness tingling weakness, bowel or bladder control problem  Peripheral Arterial Disease PAD / Heart Failure with preserved ejection fracture NYHA III Last visit with Cardiology [facility] Dr End 03/2018, has had diagnosed with moderate disease in RLE on ABI, and he has had chronic poor healing wound. He has declined angiography. Also regarding CHF / cardiomyopathy he was advised could proceed with cardiac catheterization but this was declined as well, due to EKG and ECHO findings, he was started on anti-anginal med metoprolol low dose 12.5mg  daily and continued on Aspirin and Statin low dose. - Today patient reports he does not think these medicines are warranted, he does not want to take any additional medicines, he has stopped taking rosuvastatin and metoprolol within past >1 month. 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 He has had same issue since 2006 now. last check was improved 04/2018 normal platelet. Denies bleeding or new concerns.   Depression screen [facility] 2/9 05/26/2018 05/26/2018 04/17/2018  Decreased Interest 0 0 0  Down, Depressed, Hopeless 0 0 0  PHQ - 2 Score 0 0 0  Altered sleeping - - -  Tired, decreased energy - - -  Change in appetite - - -  Feeling bad or failure about yourself  - - -  Trouble concentrating - - -  Moving slowly or fidgety/restless - - -  Suicidal thoughts - - -  PHQ-9 Score - - -  Difficult doing work/chores - - -  Social History   Tobacco Use  . Smoking status: Former Smoker    Packs/day: 1.00    Years: 30.00    Pack years: 30.00    Types: Cigarettes    Last attempt to quit: 1995    Years since quitting: 25.1  . Smokeless tobacco: Former Network engineer Use Topics  . Alcohol use: Not Currently    Frequency: Never    Comment: occasionally wine   . Drug use: No    Review of Systems Per HPI unless  specifically indicated above     Objective:    BP (!) 118/58   Pulse 71   Temp 97.9 F (36.6 C) (Oral)   Resp 16   Ht 5\' 10"  (1.778 m)   Wt 209 lb 9.6 oz (95.1 kg)   BMI 30.07 kg/m   Wt Readings from Last 3 Encounters:  05/26/18 209 lb 2.6 oz (94.9 kg)  05/26/18 209 lb 9.6 oz (95.1 kg)  04/17/18 212 lb 9.6 oz (96.4 kg)    Physical Exam Vitals signs and nursing note reviewed.  Constitutional:      General: He is not in acute distress.    Appearance: He is well-developed. He is not diaphoretic.     Comments: Well-appearing, comfortable, cooperative  HENT:     Head: Normocephalic and atraumatic.     Comments: Frontal / maxillary sinuses non-tender. Nares patent without purulence or edema. Bilateral TMs clear without erythema, effusion or bulging. Oropharynx clear without erythema, exudates, edema or asymmetry.  Voice is slightly hoarse. Eyes:     General:        Right eye: No discharge.        Left eye: No discharge.     Conjunctiva/sclera: Conjunctivae normal.  Neck:     Musculoskeletal: Normal range of motion and neck supple.     Thyroid: No thyromegaly.  Cardiovascular:     Rate and Rhythm: Normal rate and regular rhythm.     Heart sounds: Normal heart sounds. No murmur.  Pulmonary:     Effort: Pulmonary effort is normal. No respiratory distress.     Breath sounds: Normal breath sounds. No wheezing or rales.     Comments: Good air movement. Speaks full sentences. Musculoskeletal: Normal range of motion.     Comments: Low Back Inspection: Normal appearance, no spinal deformity, symmetrical. Palpation: No tenderness over spinous processes. Lower lumbar - Bilateral lumbar paraspinal muscles mild hypertonicity/spasm non reproducible tenderness ROM: Full active ROM forward flex / back extension, rotation L/R without discomfort Special Testing: Seated SLR negative for radicular pain bilaterally  Strength: Bilateral hip flex/ext 5/5, knee flex/ext 5/5, ankle  dorsiflex/plantarflex 5/5 Neurovascular: intact distal sensation to light touch  Lymphadenopathy:     Cervical: No cervical adenopathy.  Skin:    General: Skin is warm and dry.     Findings: No erythema or rash.  Neurological:     Mental Status: He is alert and oriented to person, place, and time.  Psychiatric:        Behavior: Behavior normal.     Comments: Well groomed, good eye contact, normal speech and thoughts    Results for orders placed or performed in visit on 05/18/18  PSA  Result Value Ref Range   PSA 0.3 < OR = 4.0 ng/mL  Lipid panel  Result Value Ref Range   Cholesterol 151 <200 mg/dL   HDL 44 >40 mg/dL   Triglycerides 81 <150 mg/dL   LDL Cholesterol (Calc) 90 mg/dL (calc)  Total CHOL/HDL Ratio 3.4 <5.0 (calc)   Non-HDL Cholesterol (Calc) 107 <130 mg/dL (calc)  COMPLETE METABOLIC PANEL WITH GFR  Result Value Ref Range   Glucose, Bld 106 (H) 65 - 99 mg/dL   BUN 15 7 - 25 mg/dL   Creat 0.83 0.70 - 1.11 mg/dL   GFR, Est Non African American 80 > OR = 60 mL/min/1.45m2   GFR, Est African American 92 > OR = 60 mL/min/1.54m2   BUN/Creatinine Ratio NOT APPLICABLE 6 - 22 (calc)   Sodium 139 135 - 146 mmol/L   Potassium 4.4 3.5 - 5.3 mmol/L   Chloride 105 98 - 110 mmol/L   CO2 26 20 - 32 mmol/L   Calcium 9.4 8.6 - 10.3 mg/dL   Total Protein 6.3 6.1 - 8.1 g/dL   Albumin 3.8 3.6 - 5.1 g/dL   Globulin 2.5 1.9 - 3.7 g/dL (calc)   AG Ratio 1.5 1.0 - 2.5 (calc)   Total Bilirubin 1.0 0.2 - 1.2 mg/dL   Alkaline phosphatase (APISO) 49 40 - 115 U/L   AST 20 10 - 35 U/L   ALT 15 9 - 46 U/L  CBC with Differential/Platelet  Result Value Ref Range   WBC 5.5 3.8 - 10.8 Thousand/uL   RBC 3.75 (L) 4.20 - 5.80 Million/uL   Hemoglobin 12.2 (L) 13.2 - 17.1 g/dL   HCT 36.5 (L) 38.5 - 50.0 %   MCV 97.3 80.0 - 100.0 fL   MCH 32.5 27.0 - 33.0 pg   MCHC 33.4 32.0 - 36.0 g/dL   RDW 16.2 (H) 11.0 - 15.0 %   Platelets 143 140 - 400 Thousand/uL   MPV 12.4 7.5 - 12.5 fL   Neutro Abs  3,597 1,500 - 7,800 cells/uL   Lymphs Abs 1,326 850 - 3,900 cells/uL   Absolute Monocytes 418 200 - 950 cells/uL   Eosinophils Absolute 138 15 - 500 cells/uL   Basophils Absolute 22 0 - 200 cells/uL   Neutrophils Relative % 65.4 %   Total Lymphocyte 24.1 %   Monocytes Relative 7.6 %   Eosinophils Relative 2.5 %   Basophils Relative 0.4 %  Hemoglobin A1c  Result Value Ref Range   Hgb A1c MFr Bld 5.0 <5.7 % of total Hgb   Mean Plasma Glucose 97 (calc)   eAG (mmol/L) 5.4 (calc)      Assessment & Plan:   Problem List Items Addressed This Visit    Allergic rhinitis due to allergen    Persistent rhinitis, affecting post nasal drainage and some hoarse voice Worse with recent URI symptoms, now improved Limited benefit on atrovent currently only temporary relief  Plan RESTART DAILY Flonase, need to adhere to this >4-6 weeks and then likely need for longer Future if not improving or worsening hoarse voice, we can consider refer to ENT      Cardiomyopathy [facility])    Identified by Cardiology on ECHO Also some diastolic CHF Clinically some symptoms of dyspnea on exertion previously Also underlying mild COPD Followed by [facility] [place] Dr End  Patient has discontinued statin and BB against medical advice Follow-up as planned      Chronic bilateral low back pain without sciatica    See A&P for lumbar DDD      Relevant Medications   meloxicam (MOBIC) 15 MG tablet   Chronic heart failure with preserved ejection fraction (HCC)    Clinically stable, euvolemic, with preserved EF on last ECHO Cardiomyopathy, with PVD, PAD Followed by [facility] Cardiology Dr End  He has declined angiography and catheterization. Also has self discontinued Rosuvastatin, Metoprolol.  Discussed recommendations for continuing these medicines are to treat these problems and prevent future issue, but he declines this intervention. Advised him to follow-up discussion with Dr End.      DDD (degenerative disc  disease), lumbar - Primary    Subacute on chronic bilateral LBP without associated sciatica. Suspect likely due to muscle spasm/strain, without known injury or trauma. In setting of known chronic LBP with DJD - No red flag symptoms. Negative SLR for radiculopathy Failed many treatment options in past Lumbar ESI facet injection, some NSAID, gabapentin muscle relaxant  Plan: 1. Start anti-inflammatory trial with rx Meloxicam 15mg  daily wc x 2-4 weeks, then PRN 2. Offered baclofen muscle relaxant he declined 3. May use Tylenol PRN for breakthrough 4. Encouraged use of heating pad 1-2x daily for now then PRN  He was advised to call and schedule follow-up with his [place] doctors next, after see them and determine if any other treatments, we could consider refer to pain management if indicated at that time.      Relevant Medications   meloxicam (MOBIC) 15 MG tablet   Paraseptal emphysema (HCC)    Without flare Clinically stable emphysema on prior imaging former smoker Not on maintenance therapy - declines Follow-up PRN in future if flare, we can always reconsider maintenance and more aggressive treatment, or refer to Pulm      Peripheral arterial disease ([place])    PAD on prior ABI Followed by [facility] Cardiology Dr End  He has declined angiography and catheterization. Also has self discontinued Rosuvastatin, Metoprolol.  Discussed recommendations for continuing these medicines are to treat these problems and prevent future issue, but he declines this intervention. Advised him to follow-up discussion with Dr End.      Thrombocytopenia ([place])    Previously followed by [facility] Last result CBC stable in 2019, has been stable Asymptomatic currently         Meds ordered this encounter  Medications  . meloxicam (MOBIC) 15 MG tablet    Sig: Take 1 tablet (15 mg total) by mouth daily. Take with food for 2-4 weeks then as needed    Dispense:  30 tablet    Refill:  2       Follow up plan: Return in about 6 months (around 11/24/2018) for 6 months - follow-up.   Nobie Putnam, DO [facility] 05/26/2018, 10:17 AM

## 2018-05-26 NOTE — Assessment & Plan Note (Signed)
Subacute on chronic bilateral LBP without associated sciatica. Suspect likely due to muscle spasm/strain, without known injury or trauma. In setting of known chronic LBP with DJD - No red flag symptoms. Negative SLR for radiculopathy Failed many treatment options in past Lumbar ESI facet injection, some NSAID, gabapentin muscle relaxant  Plan: 1. Start anti-inflammatory trial with rx Meloxicam 15mg  daily wc x 2-4 weeks, then PRN 2. Offered baclofen muscle relaxant he declined 3. May use Tylenol PRN for breakthrough 4. Encouraged use of heating pad 1-2x daily for now then PRN  He was advised to call and schedule follow-up with his [place] doctors next, after see them and determine if any other treatments, we could consider refer to pain management if indicated at that time.

## 2018-05-27 ENCOUNTER — Encounter: Payer: Medicare Other | Attending: Internal Medicine | Admitting: Internal Medicine

## 2018-05-27 DIAGNOSIS — L97312 Non-pressure chronic ulcer of right ankle with fat layer exposed: Secondary | ICD-10-CM | POA: Diagnosis not present

## 2018-05-27 DIAGNOSIS — L97812 Non-pressure chronic ulcer of other part of right lower leg with fat layer exposed: Secondary | ICD-10-CM | POA: Insufficient documentation

## 2018-05-27 DIAGNOSIS — I872 Venous insufficiency (chronic) (peripheral): Secondary | ICD-10-CM | POA: Diagnosis not present

## 2018-05-28 NOTE — Progress Notes (Signed)
DANIELA, HERNAN ([id number]) Visit Report for 05/27/2018 HPI Details Patient Name: Raymond Barnett, Raymond Barnett Date of Service: 05/27/2018 12:45 PM Medical Record Number: [id number] Patient Account Number: [id number] Date of Birth/Sex: 10/18/1929 (85 y.o. M) Treating RN: Cornell Barman Primary Care Provider: Nobie Putnam Other Clinician: Referring Provider: Nobie Putnam Treating Provider/Extender: Tito Dine in Treatment: 22 History of Present Illness Severity: furthermore the TBI was 0.33 on the right HPI Description: ADMISSION 12/24/17 This is an 85 year old man who arrives for review of wound on his right lateral ankle area. He states this happened about a year ago when he picked the scab off the area and the wound opened and never really healed. He has been followed by Dr. Governor Specking at [facility] wound care center. He is apparently used a multitude of topical dressings as well as skin substitutes however I'm not certain at this point with scattered skin substitutes were used. His last visit there was on 10/09/17 he's been using sorbact with hydrogel and a covering dressing. He states the wound is actually gotten better with less depth and wound volume. He is frustrated by the slowness of the healing and he is coming here for review. He also lives in [place] and this is a lot closer for him in terms of traveling. I have not had a chance to look through all of Dr. Samule Ohm notes however the wound was listed as a venous ulcer at one point. He had an x-ray done in December 2018 that did not show evidence of osteomyelitis. According to the patient osteomyelitis was ruled out. The patient actually has a complex history in this area. He suffered a serious fracture in the area perhaps 60 years ago while he was in the [employer] when [employer] over his ankle. He had a multitude of surgeries, bone graft and ultimately an ankle fusion. Apparently all the hardware however  has been taken out of the ankle quite a while ago. He does not however have a prior wound history. The patient also saw his cardiologist in May who appropriately ordered vascular tests. This showed an ABI in the right of 0.6 on the left of 1.07. This showed monophasic waveforms and all of the tibial vessels. 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 Also at 30-49% stenosis of the distal popliteal. furthermore TBI was 0.33 on the right which is markedly reduced. Looking at Dr. Marisue Humble Notes the patient refused to see an interventionalist 12/31/17; right lateral ankle wound.. He sees Dr. and his cardiologist this afternoon and they will discuss further peripheral artery evaluation. He is using Endoform. 01/07/18; right lateral ankle wound. He saw [name] was his cardiologist. He seems to be of the opinion that the patient has critical limb ischemia as a cause for nonhealing of this area. I certainly agree with that. Nevertheless the patient will not agree to an invasive test i.e. an angiogram. Therefore we are left with attempting wound care. We are using Endoform. His wife changes the dressings 01/21/18; right ankle wound just below the right lateral malleolus. Wound looks about the same. Surface does not look as bad as when he first came in here. There is a small divot a roughly 10:00 but I cannot feel bone. Using Endoform 02/04/18; right ankle wound just at the level of the right lateral malleolus. It would appear the area has expanded somewhat in length. 2 small open areas with the bridge of normal skin. There is no evidence of surrounding infection. He still is  very reluctant to undergo any invasive vascular assessment. His ABI on the right was 0.6 with monophasic waveforms in all tibial vessels. He still does not want to see an interventional list 03/04/18; monthly visit/palliative visit. This is a patient who has an open area on the level of the right lateral malleolus. He is underlying trauma to  the area and probably significant PAD. He does not want any vascular interventions in spite of efforts by myself and his interventional cardiologist [name]. He states he is improved he thinks the wound is smaller and is certainly less painful 04/01/18 Patient I see on a palliative basis. He has a small open area at the level of the right lateral malleolus. There is undermining superiorly of roughly 0.4 cm. I think this is an ischemic wound but he does not want any interventions Raymond Barnett, Raymond Barnett ([id number]) 04/29/2018; patient I see on a monthly palliative basis. He has small open areas at the level of the right lateral malleolus. We have been using silver collagen. Some improvement, there is no longer undermining. The wound may have developed into 2 small open areas separated by a bridge of epithelialization 2/5; patient I see on a monthly palliative basis. Right lateral malleolus. Not much change from a month ago. We have been using Prisma I changed him to [facility] today. If this is not going to do any different I simply discharge him on palliative use of silver nitrate or topical antibiotic ointment Electronic Signature(s) Signed: 05/27/2018 5:51:47 PM By: Linton Ham MD Entered By: Linton Ham on 05/27/2018 13:24:32 Raymond Barnett ([id number]) -------------------------------------------------------------------------------- Physical Exam Details Patient Name: Raymond Barnett Date of Service: 05/27/2018 12:45 PM Medical Record Number: [id number] Patient Account Number: [id number] Date of Birth/Sex: 06-Feb-1930 (85 y.o. M) Treating RN: Cornell Barman Primary Care Provider: Nobie Putnam Other Clinician: Referring Provider: Nobie Putnam Treating Provider/Extender: Tito Dine in Treatment: 22 Cardiovascular I cannot feel a popliteal pulse on the right side.. Pedal pulses absent bilaterally.. Notes Wound exam; small wound on the lateral malleolus on the  right. This has some undermining inferiorly. Some eschar that I can remove with a Q-tip. The base of the wound looks reasonable however his foot is cold there were no peripheral pulses Electronic Signature(s) Signed: 05/27/2018 5:51:47 PM By: Linton Ham MD Entered By: Linton Ham on 05/27/2018 13:26:31 Raymond Barnett ([id number]) -------------------------------------------------------------------------------- Physician Orders Details Patient Name: Raymond Barnett Date of Service: 05/27/2018 12:45 PM Medical Record Number: [id number] Patient Account Number: [id number] Date of Birth/Sex: 04-12-1930 (85 y.o. 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 Anesthetic (add to Medication List) Wound #1 Right,Lateral Lower Leg o Topical Lidocaine 4% cream applied to wound bed prior to debridement (In Clinic Only). Primary Wound Dressing Wound #1 Right,Lateral Lower Leg o Hydrafera Blue Ready Transfer Secondary Dressing Wound #1 Right,Lateral Lower Leg o Boardered Foam Dressing Dressing Change Frequency Wound #1 Right,Lateral Lower Leg o Change Dressing Monday, Wednesday, Friday Follow-up Appointments Wound #1 Right,Lateral Lower Leg o Return Appointment in 1 month Edema Control Wound #1 Right,Lateral Lower Leg o Elevate legs to the level of  the heart and pump ankles as often as possible Electronic Signature(s) Signed: 05/27/2018 5:28:11 PM By: Gretta Cool, BSN, RN, CWS, Kim RN, BSN Signed: 05/27/2018  5:51:47 PM By: Linton Ham MD Entered By: Gretta Cool, BSN, RN, CWS, Kim on 05/27/2018 13:24:47 Raymond Barnett, Raymond Barnett ([id number]) -------------------------------------------------------------------------------- Problem List Details Patient Name: Raymond Barnett Date of Service: 05/27/2018 12:45 PM Medical Record Number: [id number] Patient Account Number: [id number] Date of Birth/Sex: [birth year] (85 y.o. M) Treating RN: Cornell Barman Primary Care Provider: Nobie Putnam Other Clinician: Referring Provider: Nobie Putnam Treating Provider/Extender: Tito Dine in Treatment: 22 Active Problems ICD-10 Evaluated Encounter Code Description Active Date Today Diagnosis L97.311 Non-pressure chronic ulcer of right ankle limited to 12/24/2017 No Yes breakdown of skin I70.233 Atherosclerosis of native arteries of right leg with ulceration of 12/24/2017 No Yes ankle Inactive Problems Resolved Problems Electronic Signature(s) Signed: 05/27/2018 5:51:47 PM By: Linton Ham MD Entered By: Linton Ham on 05/27/2018 13:23:23 Raymond Barnett ([id number]) -------------------------------------------------------------------------------- Progress Note Details Patient Name: Raymond Barnett Date of Service: 05/27/2018 12:45 PM Medical Record Number: [id number] Patient Account Number: [id number] Date of Birth/Sex: 1929/07/31 (85 y.o. M) Treating RN: Cornell Barman Primary Care Provider: Nobie Putnam Other Clinician: Referring Provider: Nobie Putnam Treating Provider/Extender: Tito Dine in Treatment: 22 Subjective History of Present Illness (HPI) The following HPI elements were documented for the patient's wound: Severity: furthermore the TBI was 0.33 on the right ADMISSION 12/24/17 This is an 85 year old man who arrives for review of wound on his right lateral ankle area. He states this happened about a year ago when he picked the scab off the area and  the wound opened and never really healed. He has been followed by Dr. Governor Specking at [facility] wound care center. He is apparently used a multitude of topical dressings as well as skin substitutes however I'm not certain at this point with scattered skin substitutes were used. His last visit there was on 10/09/17 he's been using sorbact with hydrogel and a covering dressing. He states the wound is actually gotten better with less depth and wound volume. He is frustrated by the slowness of the healing and he is coming here for review. He also lives in [place] and this is a lot closer for him in terms of traveling. I have not had a chance to look through all of Dr. Samule Ohm notes however the wound was listed as a venous ulcer at one point. He had an x-ray done in December 2018 that did not show evidence of osteomyelitis. According to the patient osteomyelitis was ruled out. The patient actually has a complex history in this area. He suffered a serious fracture in the area perhaps 60 years ago while he was in the [employer] when [employer] over his ankle. He had a multitude of surgeries, bone graft and ultimately an ankle fusion. Apparently all the hardware however has been taken out of the ankle quite a while ago. He does not however have a prior wound history. The patient also saw his cardiologist in May who appropriately ordered vascular tests. This showed an ABI in the right of 0.6 on the left of 1.07. This showed monophasic waveforms and all of the tibial vessels. 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 Also at 30-49% stenosis of the distal popliteal. furthermore TBI was 0.33 on the right which is markedly reduced. Looking at Dr. Marisue Humble Notes the patient refused to see an interventionalist 12/31/17; right lateral ankle wound.. He sees Dr. and his  cardiologist this afternoon and they will discuss further peripheral artery evaluation. He is using Endoform. 01/07/18; right lateral ankle  wound. He saw [name] was his cardiologist. He seems to be of the opinion that the patient has critical limb ischemia as a cause for nonhealing of this area. I certainly agree with that. Nevertheless the patient will not agree to an invasive test i.e. an angiogram. Therefore we are left with attempting wound care. We are using Endoform. His wife changes the dressings 01/21/18; right ankle wound just below the right lateral malleolus. Wound looks about the same. Surface does not look as bad as when he first came in here. There is a small divot a roughly 10:00 but I cannot feel bone. Using Endoform 02/04/18; right ankle wound just at the level of the right lateral malleolus. It would appear the area has expanded somewhat in length. 2 small open areas with the bridge of normal skin. There is no evidence of surrounding infection. He still is very reluctant to undergo any invasive vascular assessment. His ABI on the right was 0.6 with monophasic waveforms in all tibial vessels. He still does not want to see an interventional list 03/04/18; monthly visit/palliative visit. This is a patient who has an open area on the level of the right lateral malleolus. He is underlying trauma to the area and probably significant PAD. He does not want any vascular interventions in spite of efforts by myself and his interventional cardiologist [name]. He states he is improved he thinks the wound is smaller and is certainly less painful 04/01/18 Patient I see on a palliative basis. He has a small open area at the level of the right lateral malleolus. There is undermining superiorly of roughly 0.4 cm. I think this is an ischemic wound but he does not want any interventions 04/29/2018; patient I see on a monthly palliative basis. He has small open areas at the level of the right lateral malleolus. We Raymond Barnett, Raymond Barnett ([id number]) have been using silver collagen. Some improvement, there is no longer undermining. The wound may  have developed into 2 small open areas separated by a bridge of epithelialization 2/5; patient I see on a monthly palliative basis. Right lateral malleolus. Not much change from a month ago. We have been using Prisma I changed him to [facility] today. If this is not going to do any different I simply discharge him on palliative use of silver nitrate or topical antibiotic ointment Objective Constitutional Vitals Time Taken: 12:53 PM, Height: 70 in, Weight: 211 lbs, BMI: 30.3, Temperature: 97.6 F, Pulse: 75 bpm, Respiratory Rate: 16 breaths/min, Blood Pressure: 120/58 mmHg. Cardiovascular I cannot feel a popliteal pulse on the right side.. Pedal pulses absent bilaterally.. General Notes: Wound exam; small wound on the lateral malleolus on the right. This has some undermining inferiorly. Some eschar that I can remove with a Q-tip. The base of the wound looks reasonable however his foot is cold there were no peripheral pulses Integumentary (Hair, Skin) Wound #1 status is Open. Original cause of wound was Other Lesion. The wound is located on the Right,Lateral Lower Leg. The wound measures 0.7cm length x 0.6cm width x 0.2cm depth; 0.33cm^2 area and 0.066cm^3 volume. There is Fat Layer (Subcutaneous Tissue) Exposed exposed. There is no tunneling noted, however, there is undermining starting at 5:00 and ending at 9:00 with a maximum distance of 0.2cm. There is a small amount of serous drainage noted. The wound margin is distinct with the outline attached  to the wound base. There is small (1-33%) pale granulation within the wound bed. There is a small (1-33%) amount of necrotic tissue within the wound bed including Adherent Slough. The periwound skin appearance exhibited: Maceration. The periwound skin appearance did not exhibit: Callus, Crepitus, Excoriation, Induration, Rash, Scarring, Dry/Scaly, Atrophie Blanche, Cyanosis, Ecchymosis, Hemosiderin Staining, Mottled, Pallor, Rubor,  Erythema. Periwound temperature was noted as No Abnormality. 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 Anesthetic (add to Medication List): Wound #1 Right,Lateral Lower Leg: Topical Lidocaine 4% cream applied to wound bed prior to debridement (In Clinic Only). Primary Wound Dressing: Wound #1 Right,Lateral Lower Leg: Hydrafera Blue Ready Transfer Secondary Dressing: Wound #1 Right,Lateral Lower Leg: Boardered Foam Dressing Dressing Change Frequency: Wound #1 Right,Lateral Lower Leg: Change Dressing Monday, Wednesday, Friday Follow-up Appointments: Wound #1 Right,Lateral Lower Leg: Return Appointment in 1 month Edema Control: Wound #1 Right,Lateral Lower Leg: Elevate legs to the level of the heart and pump ankles as often as possible 1. I change the dressing to Hydrofera Blue 2. If this does not work I will simply discharge him with a palliative dressing. 3. I think this is an ischemic area at this time. He definitely does not want any consideration of an angiogram as approve prelude to interventional surgery Electronic Signature(s) Signed: 05/27/2018 5:51:47 PM By: Linton Ham MD Entered By: Linton Ham on 05/27/2018 13:27:27 Raymond Barnett ([id number]) -------------------------------------------------------------------------------- [place] Details Patient Name: Raymond Barnett Date of Service: 05/27/2018 Medical Record Number: [id number] Patient Account Number: [id number] Date of Birth/Sex: [birth year] (85 y.o. 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

## 2018-05-28 NOTE — Progress Notes (Signed)
LEEON, MAKAR ([id number]) Visit Report for 05/27/2018 Arrival Information Details Patient Name: Raymond Barnett, Raymond Barnett Date of Service: 05/27/2018 12:45 PM Medical Record Number: [id number] Patient Account Number: [id number] Date of Birth/Sex: [birth year] (85 y.o. M) Treating RN: Cornell Barman Primary Care Nakeesha Bowler: Nobie Putnam Other Clinician: Referring Teiana Hajduk: Nobie Putnam Treating Darwin Guastella/Extender: Tito Dine in Treatment: 52 Visit Information History Since Last Visit Added or deleted any medications: Yes Patient Arrived: Cane Any new allergies or adverse reactions: No Arrival Time: 12:52 Had a fall or experienced change in No Accompanied By: wife activities of daily living that may affect Transfer Assistance: None risk of falls: Patient Identification Verified: Yes Signs or symptoms of abuse/neglect since last visito No Secondary Verification Process Completed: Yes Hospitalized since last visit: No Implantable device outside of the clinic excluding No cellular tissue based products placed in the center since last visit: Has Dressing in Place as Prescribed: Yes Pain Present Now: No Electronic Signature(s) Signed: 05/27/2018 4:36:22 PM By: Lorine Bears RCP, RRT, CHT Entered By: Lorine Bears on 05/27/2018 12:53:43 Raymond Barnett ([id number]) -------------------------------------------------------------------------------- Clinic Level of Care Assessment Details Patient Name: Raymond Barnett Date of Service: 05/27/2018 12:45 PM Medical Record Number: [id number] Patient Account Number: [id number] Date of Birth/Sex: 30-Oct-1929 (85 y.o. M) Treating RN: Cornell Barman Primary Care Marvin Maenza: Nobie Putnam Other Clinician: Referring Emeli Goguen: Nobie Putnam Treating Bambie Pizzolato/Extender: Tito Dine in Treatment: 22 Clinic Level of Care Assessment Items TOOL 4 Quantity Score []  - Use when only an EandM  is performed on FOLLOW-UP visit 0 ASSESSMENTS - Nursing Assessment / Reassessment []  - Reassessment of Co-morbidities (includes updates in patient status) 0 X- 1 5 Reassessment of Adherence to Treatment Plan ASSESSMENTS - Wound and Skin Assessment / Reassessment X - Simple Wound Assessment / Reassessment - one wound 1 5 []  - 0 Complex Wound Assessment / Reassessment - multiple wounds []  - 0 Dermatologic / Skin Assessment (not related to wound area) ASSESSMENTS - Focused Assessment []  - Circumferential Edema Measurements - multi extremities 0 []  - 0 Nutritional Assessment / Counseling / Intervention []  - 0 Lower Extremity Assessment (monofilament, tuning fork, pulses) []  - 0 Peripheral Arterial Disease Assessment (using hand held doppler) ASSESSMENTS - Ostomy and/or Continence Assessment and Care []  - Incontinence Assessment and Management 0 []  - 0 Ostomy Care Assessment and Management (repouching, etc.) PROCESS - Coordination of Care X - Simple Patient / Family Education for ongoing care 1 15 []  - 0 Complex (extensive) Patient / Family Education for ongoing care []  - 0 Staff obtains Programmer, systems, Records, Test Results / Process Orders []  - 0 Staff telephones HHA, Nursing Homes / Clarify orders / etc []  - 0 Routine Transfer to another Facility (non-emergent condition) []  - 0 Routine Hospital Admission (non-emergent condition) []  - 0 New Admissions / Biomedical engineer / Ordering NPWT, Apligraf, etc. []  - 0 Emergency Hospital Admission (emergent condition) X- 1 10 Simple Discharge Coordination Raymond Barnett, Raymond Barnett ([id number]) []  - 0 Complex (extensive) Discharge Coordination PROCESS - Special Needs []  - Pediatric / Minor Patient Management 0 []  - 0 Isolation Patient Management []  - 0 Hearing / Language / Visual special needs []  - 0 Assessment of Community assistance (transportation, D/C planning, etc.) []  - 0 Additional assistance / Altered mentation []  - 0 Support  Surface(s) Assessment (bed, cushion, seat, etc.) INTERVENTIONS - Wound Cleansing / Measurement X - Simple Wound Cleansing - one wound 1 5 []  - 0 Complex Wound Cleansing - multiple wounds X-  1 5 Wound Imaging (photographs - any number of wounds) []  - 0 Wound Tracing (instead of photographs) X- 1 5 Simple Wound Measurement - one wound []  - 0 Complex Wound Measurement - multiple wounds INTERVENTIONS - Wound Dressings []  - Small Wound Dressing one or multiple wounds 0 X- 1 15 Medium Wound Dressing one or multiple wounds []  - 0 Large Wound Dressing one or multiple wounds []  - 0 Application of Medications - topical []  - 0 Application of Medications - injection INTERVENTIONS - Miscellaneous []  - External ear exam 0 []  - 0 Specimen Collection (cultures, biopsies, blood, body fluids, etc.) []  - 0 Specimen(s) / Culture(s) sent or taken to Lab for analysis []  - 0 Patient Transfer (multiple staff / Civil Service fast streamer / Similar devices) []  - 0 Simple Staple / Suture removal (25 or less) []  - 0 Complex Staple / Suture removal (26 or more) []  - 0 Hypo / Hyperglycemic Management (close monitor of Blood Glucose) []  - 0 Ankle / Brachial Index (ABI) - do not check if billed separately X- 1 5 Vital Signs Raymond Barnett, Raymond Barnett ([id number]) Has the patient been seen at the hospital within the last three years: Yes Total Score: 70 Level Of Care: New/Established - Level 2 Electronic Signature(s) Signed: 05/27/2018 5:28:11 PM By: Gretta Cool, BSN, RN, CWS, Kim RN, BSN Entered By: Gretta Cool, BSN, RN, CWS, Kim on 05/27/2018 13:25:50 Raymond Barnett ([id number]) -------------------------------------------------------------------------------- Encounter Discharge Information Details Patient Name: Raymond Barnett Date of Service: 05/27/2018 12:45 PM Medical Record Number: [id number] Patient Account Number: [id number] Date of Birth/Sex: 07-29-1929 (85 y.o. M) Treating RN: Cornell Barman Primary Care Cherish Runde: Nobie Putnam Other Clinician: Referring Dorella Laster: Nobie Putnam Treating Jhace Fennell/Extender: Tito Dine in Treatment: 9 Encounter Discharge Information Items Discharge Condition: Stable Ambulatory Status: Ambulatory Discharge Destination: Home Transportation: Private Auto Accompanied By: wife Schedule Follow-up Appointment: Yes Clinical Summary of Care: Electronic Signature(s) Signed: 05/27/2018 5:28:11 PM By: Gretta Cool, BSN, RN, CWS, Kim RN, BSN Entered By: Gretta Cool, BSN, RN, CWS, Kim on 05/27/2018 13:26:54 Raymond Barnett ([id number]) -------------------------------------------------------------------------------- Lower Extremity Assessment Details Patient Name: Raymond Barnett Date of Service: 05/27/2018 12:45 PM Medical Record Number: [id number] Patient Account Number: [id number] Date of Birth/Sex: March 17, 1930 (85 y.o. M) Treating RN: Montey Hora Primary Care Marcelline Temkin: Nobie Putnam Other Clinician: Referring Blong Busk: Nobie Putnam Treating Zaylie Gisler/Extender: Tito Dine in Treatment: 22 Edema Assessment Assessed: [Left: No] [Right: No] Edema: [Left: N] [Right: o] Calf Left: Right: Point of Measurement: 37 cm From Medial Instep cm 32 cm Ankle Left: Right: Point of Measurement: 14 cm From Medial Instep cm 24.5 cm Vascular Assessment Pulses: Dorsalis Pedis Palpable: [Right:Yes] Posterior Tibial Extremity colors, hair growth, and conditions: Extremity Color: [Right:Hyperpigmented] Hair Growth on Extremity: [Right:No] Temperature of Extremity: [Right:Warm] Capillary Refill: [Right:< 3 seconds] Toe Nail Assessment Left: Right: Thick: Yes Discolored: Yes Deformed: No Improper Length and Hygiene: No Electronic Signature(s) Signed: 05/27/2018 5:48:53 PM By: Montey Hora Entered By: Montey Hora on 05/27/2018 13:04:23 Raymond Barnett  ([id number]) -------------------------------------------------------------------------------- Multi Wound Chart Details Patient Name: Raymond Barnett Date of Service: 05/27/2018 12:45 PM Medical Record Number: [id number] Patient Account Number: [id number] Date of Birth/Sex: 01-16-1930 (85 y.o. M) Treating RN: Cornell Barman Primary Care Huda Petrey: Nobie Putnam Other Clinician: Referring Nicanor Mendolia: Nobie Putnam Treating Viraj Liby/Extender: Tito Dine in Treatment: 22 Vital Signs Height(in): 70 Pulse(bpm): 75 Weight(lbs): 211 Blood Pressure(mmHg): 120/58 Body Mass Index(BMI): 30 Temperature(F): 97.6 Respiratory Rate 16 (breaths/min): Photos: [1:No Photos] [N/A:N/A] Wound Location: [1:Right  Lower Leg - Lateral] [N/A:N/A] Wounding Event: [1:Other Lesion] [N/A:N/A] Primary Etiology: [1:Venous Leg Ulcer] [N/A:N/A] Comorbid History: [1:Glaucoma, Hypertension, Peripheral Venous Disease, Rheumatoid Arthritis] [N/A:N/A] Date Acquired: [1:11/20/2017] [N/A:N/A] Weeks of Treatment: [1:22] [N/A:N/A] Wound Status: [1:Open] [N/A:N/A] Measurements L x W x D [1:0.7x0.6x0.2] [N/A:N/A] (cm) Area (cm) : [1:0.33] [N/A:N/A] Volume (cm) : [1:0.066] [N/A:N/A] % Reduction in Area: [1:47.50%] [N/A:N/A] % Reduction in Volume: [1:47.60%] [N/A:N/A] Starting Position 1 [1:5] (o'clock): Ending Position 1 [1:9] (o'clock): Maximum Distance 1 (cm): [1:0.2] Undermining: [1:Yes] [N/A:N/A] Classification: [1:Full Thickness Without Exposed Support Structures] [N/A:N/A] Exudate Amount: [1:Small] [N/A:N/A] Exudate Type: [1:Serous] [N/A:N/A] Exudate Color: [1:amber] [N/A:N/A] Wound Margin: [1:Distinct, outline attached] [N/A:N/A] Granulation Amount: [1:Small (1-33%)] [N/A:N/A] Granulation Quality: [1:Pale] [N/A:N/A] Necrotic Amount: [1:Small (1-33%)] [N/A:N/A] Exposed Structures: [1:Fat Layer (Subcutaneous Tissue) Exposed: Yes Fascia: No Tendon: No Muscle: No] [N/A:N/A] Joint:  No Bone: No Epithelialization: None N/A N/A Periwound Skin Texture: Excoriation: No N/A N/A Induration: No Callus: No Crepitus: No Rash: No Scarring: No Periwound Skin Moisture: Maceration: Yes N/A N/A Dry/Scaly: No Periwound Skin Color: Atrophie Blanche: No N/A N/A Cyanosis: No Ecchymosis: No Erythema: No Hemosiderin Staining: No Mottled: No Pallor: No Rubor: No Temperature: No Abnormality N/A N/A Tenderness on Palpation: No N/A N/A Wound Preparation: Ulcer Cleansing: N/A N/A Rinsed/Irrigated with Saline Topical Anesthetic Applied: Other: lidocaine 4% Treatment Notes Electronic Signature(s) Signed: 05/27/2018 5:51:47 PM By: Linton Ham MD Entered By: Linton Ham on 05/27/2018 13:23:30 Raymond Barnett ([id number]) -------------------------------------------------------------------------------- Multi-Disciplinary Care Plan Details Patient Name: Raymond Barnett Date of Service: 05/27/2018 12:45 PM Medical Record Number: [id number] Patient Account Number: [id number] Date of Birth/Sex: [birth year] (85 y.o. M) Treating RN: Cornell Barman Primary Care Kynesha Guerin: Nobie Putnam Other Clinician: Referring Lilliauna Van: Nobie Putnam Treating Cortez Steelman/Extender: Tito Dine in Treatment: 22 Active Inactive Orientation to the Wound Care Program Nursing Diagnoses: Knowledge deficit related to the wound healing center program Goals: Patient/caregiver will verbalize understanding of the [facility] Program Date Initiated: 12/24/2017 Target Resolution Date: 01/24/2018 Goal Status: Active Interventions: Provide education on orientation to the wound center Notes: Wound/Skin Impairment Nursing Diagnoses: Impaired tissue integrity Knowledge deficit related to ulceration/compromised skin integrity Goals: Ulcer/skin breakdown will have a volume reduction of 30% by week 4 Date Initiated: 12/24/2017 Target Resolution Date: 01/24/2018 Goal Status:  Active Interventions: Provide education on ulcer and skin care Treatment Activities: Topical wound management initiated : 12/24/2017 Notes: Electronic Signature(s) Signed: 05/27/2018 5:28:11 PM By: Gretta Cool, BSN, RN, CWS, Kim RN, BSN Entered By: Gretta Cool, BSN, RN, CWS, Kim on 05/27/2018 13:19:28 Raymond Barnett ([id number]) -------------------------------------------------------------------------------- Pain Assessment Details Patient Name: Raymond Barnett Date of Service: 05/27/2018 12:45 PM Medical Record Number: [id number] Patient Account Number: [id number] Date of Birth/Sex: [birth year] (85 y.o. M) Treating RN: Cornell Barman Primary Care Panzy Bubeck: Nobie Putnam Other Clinician: Referring Yui Mulvaney: Nobie Putnam Treating Audryanna Zurita/Extender: Tito Dine in Treatment: 22 Active Problems Location of Pain Severity and Description of Pain Patient Has Paino No Site Locations Pain Management and Medication Current Pain Management: Electronic Signature(s) Signed: 05/27/2018 4:36:22 PM By: Lorine Bears RCP, RRT, CHT Signed: 05/27/2018 5:28:11 PM By: Gretta Cool, BSN, RN, CWS, Kim RN, BSN Entered By: Lorine Bears on 05/27/2018 12:53:54 Raymond Barnett ([id number]) -------------------------------------------------------------------------------- Patient/Caregiver Education Details Patient Name: Raymond Barnett Date of Service: 05/27/2018 12:45 PM Medical Record Number: [id number] Patient Account Number: [id number] Date of Birth/Gender: 1929/07/12 (85 y.o. M) Treating RN: Cornell Barman Primary Care Physician: Nobie Putnam Other Clinician: Referring Physician: Nobie Putnam Treating Physician/Extender: Dellia Nims  MICHAEL G Weeks in Treatment: 22 Education Assessment Education Provided To: Patient Education Topics Provided Wound/Skin Impairment: Handouts: Caring for Your Ulcer Methods: Demonstration, Explain/Verbal Responses: State  content correctly Electronic Signature(s) Signed: 05/27/2018 5:28:11 PM By: Gretta Cool, BSN, RN, CWS, Kim RN, BSN Entered By: Gretta Cool, BSN, RN, CWS, Kim on 05/27/2018 13:26:59 Raymond Barnett ([id number]) -------------------------------------------------------------------------------- Wound Assessment Details Patient Name: Raymond Barnett Date of Service: 05/27/2018 12:45 PM Medical Record Number: [id number] Patient Account Number: [id number] Date of Birth/Sex: Jul 14, 1929 (85 y.o. M) Treating RN: Montey Hora Primary Care Jaceyon Strole: Nobie Putnam Other Clinician: Referring Christos Mixson: Nobie Putnam Treating Rea Kalama/Extender: Tito Dine in Treatment: 22 Wound Status Wound Number: 1 Primary Venous Leg Ulcer Etiology: Wound Location: Right Lower Leg - Lateral Wound Open Wounding Event: Other Lesion Status: Date Acquired: 11/20/2017 Comorbid Glaucoma, Hypertension, Peripheral Venous Weeks Of Treatment: 22 History: Disease, Rheumatoid Arthritis Clustered Wound: No Photos Photo Uploaded By: Montey Hora on 05/27/2018 13:37:47 Wound Measurements Length: (cm) 0.7 Width: (cm) 0.6 Depth: (cm) 0.2 Area: (cm) 0.33 Volume: (cm) 0.066 % Reduction in Area: 47.5% % Reduction in Volume: 47.6% Epithelialization: None Tunneling: No Undermining: Yes Starting Position (o'clock): 5 Ending Position (o'clock): 9 Maximum Distance: (cm) 0.2 Wound Description Full Thickness Without Exposed Support Classification: Structures Wound Margin: Distinct, outline attached Exudate Small Amount: Exudate Type: Serous Exudate Color: amber Foul Odor After Cleansing: No Slough/Fibrino Yes Wound Bed Granulation Amount: Small (1-33%) Exposed Structure Granulation Quality: Pale Fascia Exposed: No Necrotic Amount: Small (1-33%) Fat Layer (Subcutaneous Tissue) Exposed: Yes Raymond Barnett, Raymond Barnett ([id number]) Necrotic Quality: Adherent Slough Tendon Exposed: No Muscle Exposed:  No Joint Exposed: No Bone Exposed: No Periwound Skin Texture Texture Color No Abnormalities Noted: No No Abnormalities Noted: No Callus: No Atrophie Blanche: No Crepitus: No Cyanosis: No Excoriation: No Ecchymosis: No Induration: No Erythema: No Rash: No Hemosiderin Staining: No Scarring: No Mottled: No Pallor: No Moisture Rubor: No No Abnormalities Noted: No Dry / Scaly: No Temperature / Pain Maceration: Yes Temperature: No Abnormality Wound Preparation Ulcer Cleansing: Rinsed/Irrigated with Saline Topical Anesthetic Applied: Other: lidocaine 4%, Treatment Notes Wound #1 (Right, Lateral Lower Leg) Notes Hydrofera Blue and bordered foam dressing Electronic Signature(s) Signed: 05/27/2018 5:48:53 PM By: Montey Hora Entered By: Montey Hora on 05/27/2018 13:02:06 Raymond Barnett ([id number]) -------------------------------------------------------------------------------- Vitals Details Patient Name: Raymond Barnett Date of Service: 05/27/2018 12:45 PM Medical Record Number: [id number] Patient Account Number: [id number] Date of Birth/Sex: [birth year] (85 y.o. M) Treating RN: Cornell Barman Primary Care Onetta Spainhower: Nobie Putnam Other Clinician: Referring Sonal Dorwart: Nobie Putnam Treating Ebonique Hallstrom/Extender: Tito Dine in Treatment: 22 Vital Signs Time Taken: 12:53 Temperature (F): 97.6 Height (in): 70 Pulse (bpm): 75 Weight (lbs): 211 Respiratory Rate (breaths/min): 16 Body Mass Index (BMI): 30.3 Blood Pressure (mmHg): 120/58 Reference Range: 80 - 120 mg / dl Airway Electronic Signature(s) Signed: 05/27/2018 4:36:22 PM By: Lorine Bears RCP, RRT, CHT Entered By: Lorine Bears on 05/27/2018 12:56:48

## 2018-06-24 ENCOUNTER — Encounter: Payer: Medicare Other | Attending: Internal Medicine | Admitting: Internal Medicine

## 2018-06-24 DIAGNOSIS — I70233 Atherosclerosis of native arteries of right leg with ulceration of ankle: Secondary | ICD-10-CM | POA: Insufficient documentation

## 2018-06-24 DIAGNOSIS — L97312 Non-pressure chronic ulcer of right ankle with fat layer exposed: Secondary | ICD-10-CM | POA: Diagnosis not present

## 2018-06-24 DIAGNOSIS — L97311 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 of skin: Secondary | ICD-10-CM | POA: Diagnosis not present

## 2018-06-24 DIAGNOSIS — M069 Rheumatoid arthritis, unspecified: Secondary | ICD-10-CM | POA: Insufficient documentation

## 2018-06-24 DIAGNOSIS — I872 Venous insufficiency (chronic) (peripheral): Secondary | ICD-10-CM | POA: Diagnosis not present

## 2018-06-26 NOTE — Progress Notes (Signed)
Raymond Barnett, Raymond Barnett ([id number]) Visit Report for 06/24/2018 Debridement Details Patient Name: Raymond Barnett, Raymond Barnett Date of Service: 06/24/2018 12:45 PM Medical Record Number: [id number] Patient Account Number: [id number] Date of Birth/Sex: 1929-09-28 (85 y.o. M) Treating RN: Cornell Barman Primary Care Provider: Nobie Putnam Other Clinician: Referring Provider: Nobie Putnam Treating Provider/Extender: Tito Dine in Treatment: 26 Debridement Performed for Wound #1 Right,Lateral Lower Leg Assessment: Performed By: Physician Ricard Dillon, MD Debridement Type: Debridement Severity of Tissue Pre Fat layer exposed Debridement: Level of Consciousness (Pre- Awake and Alert procedure): Pre-procedure Verification/Time Yes - 13:10 Out Taken: Start Time: 13:10 Pain Control: Lidocaine Total Area Debrided (L x W): 0.6 (cm) x 0.65 (cm) = 0.39 (cm) Tissue and other material Viable, Non-Viable, Slough, Subcutaneous, Slough debrided: Level: Skin/Subcutaneous Tissue Debridement Description: Excisional Instrument: Curette Bleeding: Minimum Hemostasis Achieved: Pressure End Time: 13:11 Response to Treatment: Procedure was tolerated well Level of Consciousness Awake and Alert (Post-procedure): Post Debridement Measurements of Total Wound Length: (cm) 0.6 Width: (cm) 0.5 Depth: (cm) 0.2 Volume: (cm) 0.047 Character of Wound/Ulcer Post Debridement: Requires Further Debridement Severity of Tissue Post Debridement: Fat layer exposed Post Procedure Diagnosis Same as Pre-procedure Electronic Signature(s) Signed: 06/24/2018 5:05:37 PM By: Linton Ham MD Signed: 06/24/2018 5:29:19 PM By: Gretta Cool, BSN, RN, CWS, Kim RN, BSN Entered By: Linton Ham on 06/24/2018 13:24:05 Raymond Barnett ([id number]) -------------------------------------------------------------------------------- HPI Details Patient Name: Raymond Barnett Date of Service: 06/24/2018 12:45 PM Medical  Record Number: [id number] Patient Account Number: [id number] Date of Birth/Sex: 22-Dec-1929 (85 y.o. M) Treating RN: Cornell Barman Primary Care Provider: Nobie Putnam Other Clinician: Referring Provider: Nobie Putnam Treating Provider/Extender: Tito Dine in Treatment: 26 History of Present Illness Severity: furthermore the TBI was 0.33 on the right HPI Description: ADMISSION 12/24/17 This is an 85 year old man who arrives for review of wound on his right lateral ankle area. He states this happened about a year ago when he picked the scab off the area and the wound opened and never really healed. He has been followed by Dr. Governor Specking at [facility] wound care center. He is apparently used a multitude of topical dressings as well as skin substitutes however I'm not certain at this point with scattered skin substitutes were used. His last visit there was on 10/09/17 he's been using sorbact with hydrogel and a covering dressing. He states the wound is actually gotten better with less depth and wound volume. He is frustrated by the slowness of the healing and he is coming here for review. He also lives in [place] and this is a lot closer for him in terms of traveling. I have not had a chance to look through all of Dr. Samule Ohm notes however the wound was listed as a venous ulcer at one point. He had an x-ray done in December 2018 that did not show evidence of osteomyelitis. According to the patient osteomyelitis was ruled out. The patient actually has a complex history in this area. He suffered a serious fracture in the area perhaps 60 years ago while he was in the [employer] when [employer] over his ankle. He had a multitude of surgeries, bone graft and ultimately an ankle fusion. Apparently all the hardware however has been taken out of the ankle quite a while ago. He does not however have a prior wound history. The patient also saw his cardiologist in May  who appropriately ordered vascular tests. This showed an ABI in the right of 0.6 on the left of  1.07. This showed monophasic waveforms and all of the tibial vessels. 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 Also at 30-49% stenosis of the distal popliteal. furthermore TBI was 0.33 on the right which is markedly reduced. Looking at Dr. Marisue Humble Notes the patient refused to see an interventionalist 12/31/17; right lateral ankle wound.. He sees Dr. and his cardiologist this afternoon and they will discuss further peripheral artery evaluation. He is using Endoform. 01/07/18; right lateral ankle wound. He saw [name] was his cardiologist. He seems to be of the opinion that the patient has critical limb ischemia as a cause for nonhealing of this area. I certainly agree with that. Nevertheless the patient will not agree to an invasive test i.e. an angiogram. Therefore we are left with attempting wound care. We are using Endoform. His wife changes the dressings 01/21/18; right ankle wound just below the right lateral malleolus. Wound looks about the same. Surface does not look as bad as when he first came in here. There is a small divot a roughly 10:00 but I cannot feel bone. Using Endoform 02/04/18; right ankle wound just at the level of the right lateral malleolus. It would appear the area has expanded somewhat in length. 2 small open areas with the bridge of normal skin. There is no evidence of surrounding infection. He still is very reluctant to undergo any invasive vascular assessment. His ABI on the right was 0.6 with monophasic waveforms in all tibial vessels. He still does not want to see an interventional list 03/04/18; monthly visit/palliative visit. This is a patient who has an open area on the level of the right lateral malleolus. He is underlying trauma to the area and probably significant PAD. He does not want any vascular interventions in spite of efforts by myself and his interventional  cardiologist [name]. He states he is improved he thinks the wound is smaller and is certainly less painful 04/01/18 Patient I see on a palliative basis. He has a small open area at the level of the right lateral malleolus. There is undermining superiorly of roughly 0.4 cm. I think this is an ischemic wound but he does not want any interventions 04/29/2018; patient I see on a monthly palliative basis. He has small open areas at the level of the right lateral malleolus. We have been using silver collagen. Some improvement, there is no longer undermining. The wound may have developed into 2 small open areas separated by a bridge of epithelialization 2/5; patient I see on a monthly palliative basis. Right lateral malleolus. Not much change from a month ago. We have been Raymond Barnett, Raymond Barnett ([id number]) using Prisma I changed him to [facility] today. If this is not going to do any different I simply discharge him on palliative use of silver nitrate or topical antibiotic ointment 3/4; patient I see on a monthly palliative basis. He has a ischemic wound in the right lateral malleolus. We have been using Hydrofera Blue. Paradoxically this actually looks quite a bit better today. Large part of his original wound appears to be epithelialized. He still has a arc above the area that is still open with a necrotic surface. Electronic Signature(s) Signed: 06/24/2018 5:05:37 PM By: Linton Ham MD Entered By: Linton Ham on 06/24/2018 13:25:09 Raymond Barnett ([id number]) -------------------------------------------------------------------------------- Physician Orders Details Patient Name: Raymond Barnett Date of Service: 06/24/2018 12:45 PM Medical Record Number: [id number] Patient Account Number: [id number] Date of Birth/Sex: 10/09/1929 (85 y.o. M) Treating RN: Cornell Barman Primary Care Provider:  Nobie Putnam Other Clinician: Referring Provider: Nobie Putnam Treating  Provider/Extender: Tito Dine in Treatment: 17 Verbal / Phone Orders: No Diagnosis Coding Wound Cleansing Wound #1 Right,Lateral Lower Leg o Clean wound with Normal Saline. Anesthetic (add to Medication List) Wound #1 Right,Lateral Lower Leg o Topical Lidocaine 4% cream applied to wound bed prior to debridement (In Clinic Only). Primary Wound Dressing Wound #1 Right,Lateral Lower Leg o Hydrafera Blue Ready Transfer Secondary Dressing Wound #1 Right,Lateral Lower Leg o Boardered Foam Dressing Dressing Change Frequency Wound #1 Right,Lateral Lower Leg o Change Dressing Monday, Wednesday, Friday Follow-up Appointments Wound #1 Right,Lateral Lower Leg o Return Appointment in 1 month Edema Control Wound #1 Right,Lateral Lower Leg o Elevate legs to the level of the heart and pump ankles as often as possible Electronic Signature(s) Signed: 06/24/2018 5:05:37 PM By: Linton Ham MD Signed: 06/24/2018 5:29:19 PM By: Gretta Cool, BSN, RN, CWS, Kim RN, BSN Entered By: Gretta Cool, BSN, RN, CWS, Kim on 06/24/2018 13:13:24 Raymond Barnett ([id number]) -------------------------------------------------------------------------------- Problem List Details Patient Name: Raymond Barnett Date of Service: 06/24/2018 12:45 PM Medical Record Number: [id number] Patient Account Number: [id number] Date of Birth/Sex: 1929-10-03 (85 y.o. 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 M) Treating RN: Cornell Barman Primary Care Provider: Nobie Putnam Other Clinician: Referring Provider: Nobie Putnam Treating Provider/Extender: Tito Dine in Treatment: 26 Subjective History of Present Illness (HPI) The following HPI elements were documented for the patient's wound: Severity: furthermore the TBI was 0.33 on the right ADMISSION 12/24/17 This is an 85 year old man who arrives for review of wound on his right lateral ankle area. He states this happened about a year ago when he picked the scab off the area and the wound opened and never really healed. He has been followed by Dr. Governor Specking at [facility] wound care center. He is apparently used a multitude of topical dressings as well as skin substitutes however I'm not certain at this point with scattered skin substitutes were used. His last visit there was on 10/09/17 he's been using sorbact with hydrogel and a covering dressing. He states the wound is actually gotten better with less depth and wound volume. He is frustrated by the slowness of the healing and he is coming here for review. He also lives in [place] and this is a lot closer for him in terms of traveling. I have not had a chance to look through all of Dr. Samule Ohm notes however the wound was listed as a venous ulcer at one point. He had an x-ray done in December 2018 that did not show evidence of osteomyelitis. According to the patient osteomyelitis was ruled out. The patient actually has a complex history in this area. He  suffered a serious fracture in the area perhaps 60 years ago while he was in the [employer] when  [employer] over his ankle. He had a multitude of surgeries, bone graft and ultimately an ankle fusion. Apparently all the hardware however has been taken out of the ankle quite a while ago. He does not however have a prior wound history. The patient also saw his cardiologist in May who appropriately ordered vascular tests. This showed an ABI in the right of 0.6 on the left of 1.07. This showed monophasic waveforms and all of the tibial vessels. 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 Also at 30-49% stenosis of the distal popliteal. furthermore TBI was 0.33 on the right which is markedly reduced. Looking at Dr. Marisue Humble Notes the patient refused to see an interventionalist 12/31/17; right lateral ankle wound.. He sees Dr. and his cardiologist this afternoon and they will discuss further peripheral artery evaluation. He is using Endoform. 01/07/18; right lateral ankle wound. He saw [name] was his cardiologist. He seems to be of the opinion that the patient has critical limb ischemia as a cause for nonhealing of this area. I certainly agree with that. Nevertheless the patient will not agree to an invasive test i.e. an angiogram. Therefore we are left with attempting wound care. We are using Endoform. His wife changes the dressings 01/21/18; right ankle wound just below the right lateral malleolus. Wound looks about the same. Surface does not look as bad as when he first came in here. There is a small divot a roughly 10:00 but I cannot feel bone. Using Endoform 02/04/18; right ankle wound just at the level of the right lateral malleolus. It would appear the area has expanded somewhat in length. 2 small open areas with the bridge of normal skin. There is no evidence of surrounding infection. He still is very reluctant to undergo any invasive vascular assessment. His ABI on the right was 0.6 with monophasic  waveforms in all tibial vessels. He still does not want to see an interventional list 03/04/18; monthly visit/palliative visit. This is a patient who has an open area on the level of the right lateral malleolus. He is underlying trauma to the area and probably significant PAD. He does not want any vascular interventions in spite of efforts by myself and his interventional cardiologist [name]. He states he is improved he thinks the wound is smaller and is certainly less painful 04/01/18 Patient I see on a palliative basis. He has a small open area at the level of the right lateral malleolus. There is undermining superiorly of roughly 0.4 cm. I think this is an ischemic wound but he does not want any interventions 04/29/2018; patient I see on a monthly palliative basis. He has small open areas at the level of the right lateral malleolus. We Raymond Barnett, Raymond Barnett ([id number]) have been using silver collagen. Some improvement, there is no longer undermining. The wound may have developed into 2 small open areas separated by a bridge of epithelialization 2/5; patient I see on a monthly palliative basis. Right lateral malleolus. Not much change from a month ago. We have been using Prisma I changed him to [employer] today. If this is not going to do any different I simply discharge him on palliative use of silver nitrate or topical antibiotic ointment 3/4; patient I see on a monthly palliative basis. He has a ischemic wound in the right lateral malleolus. We have been using Hydrofera Blue. Paradoxically this actually looks quite a bit better today. Large part of his original wound appears to be epithelialized. He still has a arc  above the area that is still open with a necrotic surface. Objective Constitutional Vitals Time Taken: 12:44 PM, Height: 70 in, Weight: 211 lbs, BMI: 30.3, Temperature: 97.9 F, Pulse: 81 bpm, Respiratory Rate: 16 breaths/min, Blood Pressure: 135/66 mmHg. Integumentary (Hair,  Skin) Wound #1 status is Open. Original cause of wound was Other Lesion. The wound is located on the Right,Lateral Lower Leg. The wound measures 0.6cm length x 0.5cm width x 0.2cm depth; 0.236cm^2 area and 0.047cm^3 volume. There is Fat Layer (Subcutaneous Tissue) Exposed exposed. There is no tunneling or undermining noted. There is a medium amount of serous drainage noted. The wound margin is distinct with the outline attached to the wound base. There is medium (34-66%) pale granulation within the wound bed. There is a medium (34-66%) amount of necrotic tissue within the wound bed including Adherent Slough. The periwound skin appearance exhibited: Maceration. The periwound skin appearance did not exhibit: Callus, Crepitus, Excoriation, Induration, Rash, Scarring, Dry/Scaly, Atrophie Blanche, Cyanosis, Ecchymosis, Hemosiderin Staining, Mottled, Pallor, Rubor, Erythema. Periwound temperature was noted as No Abnormality. 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 There was a Excisional Skin/Subcutaneous Tissue Debridement with a total area of 0.39 sq cm performed by Ricard Dillon, MD. With the following instrument(s): Curette to remove Viable and Non-Viable tissue/material. Material removed includes Subcutaneous Tissue and Slough and after achieving pain control using Lidocaine. No specimens were taken. A time out was conducted at 13:10, prior to the start of the procedure. A Minimum amount of Raymond Barnett, Raymond Barnett. ([id number]) bleeding was controlled with Pressure. The procedure was tolerated well. Post Debridement Measurements: 0.6cm length x 0.5cm width x 0.2cm depth; 0.047cm^3  volume. Character of Wound/Ulcer Post Debridement requires further debridement. Severity of Tissue Post Debridement is: Fat layer exposed. Post procedure Diagnosis Wound #1: Same as Pre-Procedure Plan Wound Cleansing: Wound #1 Right,Lateral Lower Leg: Clean wound with Normal Saline. Anesthetic (add to Medication List): Wound #1 Right,Lateral Lower Leg: Topical Lidocaine 4% cream applied to wound bed prior to debridement (In Clinic Only). Primary Wound Dressing: Wound #1 Right,Lateral Lower Leg: Hydrafera Blue Ready Transfer Secondary Dressing: Wound #1 Right,Lateral Lower Leg: Boardered Foam Dressing Dressing Change Frequency: Wound #1 Right,Lateral Lower Leg: Change Dressing Monday, Wednesday, Friday Follow-up Appointments: Wound #1 Right,Lateral Lower Leg: Return Appointment in 1 month Edema Control: Wound #1 Right,Lateral Lower Leg: Elevate legs to the level of the heart and pump ankles as often as possible 1. Hydrofera Blue 2. This seems to be making nice progress. The area above this that I debrided today I think was part of the original wound. Most of the wound area and the original site inferiorly has epithelialized Electronic Signature(s) Signed: 06/24/2018 5:05:37 PM By: Linton Ham MD Entered By: Linton Ham on 06/24/2018 13:26:35 Raymond Barnett ([id number]) -------------------------------------------------------------------------------- [place] Details Patient Name: Raymond Barnett Date of Service: 06/24/2018 Medical Record Number: [id number] Patient Account Number: [id number] Date of Birth/Sex: 1929/06/06 (85 y.o. M) Treating RN: Cornell Barman Primary Care Provider: Nobie Putnam Other Clinician: Referring Provider: Nobie Putnam Treating Provider/Extender: Tito Dine in Treatment: 26 Diagnosis Coding ICD-10 Codes Code Description L24.401 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 of skin I70.233  Atherosclerosis of native arteries of right leg  with ulceration of ankle Facility Procedures CPT4 Code: 16109604 Description: 54098 - DEB SUBQ TISSUE 20 SQ CM/< ICD-10 Diagnosis Description J19.147 Non-pressure chronic ulcer of right ankle limited to breakdow I70.233 Atherosclerosis of native arteries of right leg with ulcerati Modifier: n of skin on of ankle Quantity: 1 Physician Procedures CPT4 Code: [id number] Description: 30865 - WC PHYS SUBQ TISS 20 SQ CM ICD-10 Diagnosis Description H84.696 Non-pressure chronic ulcer of right ankle limited to breakdow I70.233 Atherosclerosis of native arteries of right leg with ulcerati Modifier: n of skin on of ankle Quantity: 1 Electronic Signature(s) Signed: 06/24/2018 5:05:37 PM By: Linton Ham MD Entered By: Linton Ham on 06/24/2018 13:26:53

## 2018-06-26 NOTE — Progress Notes (Signed)
MUNEEB, VERAS ([id number]) Visit Report for 06/24/2018 Arrival Information Details Patient Name: Raymond Barnett, Raymond Barnett Date of Service: 06/24/2018 12:45 PM Medical Record Number: [id number] Patient Account Number: [id number] Date of Birth/Sex: 1929-08-05 (85 y.o. M) Treating RN: Montey Hora Primary Care Kambre Messner: Nobie Putnam Other Clinician: Referring Marianny Goris: Nobie Putnam Treating Coreen Shippee/Extender: Tito Dine in Treatment: 47 Visit Information History Since Last Visit Added or deleted any medications: No Patient Arrived: Cane Any new allergies or adverse reactions: No Arrival Time: 12:42 Had a fall or experienced change in No Accompanied By: spouse activities of daily living that may affect Transfer Assistance: None risk of falls: Patient Identification Verified: Yes Signs or symptoms of abuse/neglect since last visito No Secondary Verification Process Completed: Yes Hospitalized since last visit: No Implantable device outside of the clinic excluding No cellular tissue based products placed in the center since last visit: Has Dressing in Place as Prescribed: Yes Pain Present Now: No Electronic Signature(s) Signed: 06/24/2018 4:53:18 PM By: Montey Hora Entered By: Montey Hora on 06/24/2018 12:43:00 Raymond Barnett ([id number]) -------------------------------------------------------------------------------- Encounter Discharge Information Details Patient Name: Raymond Barnett Date of Service: 06/24/2018 12:45 PM Medical Record Number: [id number] Patient Account Number: [id number] Date of Birth/Sex: [birth year] (85 y.o. M) Treating RN: Army Melia Primary Care Juwuan Sedita: Nobie Putnam Other Clinician: Referring Keisa Blow: Nobie Putnam Treating Loletha Bertini/Extender: Tito Dine in Treatment: 94 Encounter Discharge Information Items Post Procedure Vitals Discharge Condition: Stable Temperature (F): 97.9 Ambulatory  Status: Ambulatory Pulse (bpm): 81 Discharge Destination: Home Respiratory Rate (breaths/min): 16 Transportation: Private Auto Blood Pressure (mmHg): 135/66 Accompanied By: wife Schedule Follow-up Appointment: Yes Clinical Summary of Care: Electronic Signature(s) Signed: 06/24/2018 4:40:46 PM By: Army Melia Entered By: Army Melia on 06/24/2018 13:22:47 Raymond Barnett ([id number]) -------------------------------------------------------------------------------- Lower Extremity Assessment Details Patient Name: Raymond Barnett Date of Service: 06/24/2018 12:45 PM Medical Record Number: [id number] Patient Account Number: [id number] Date of Birth/Sex: May 26, 1929 (85 y.o. M) Treating RN: Montey Hora Primary Care Belvin Gauss: Nobie Putnam Other Clinician: Referring Sheryl Towell: Nobie Putnam Treating Deen Deguia/Extender: Tito Dine in Treatment: 26 Edema Assessment Assessed: [Left: No] [Right: No] [Left: Edema] [Right: :] Calf Left: Right: Point of Measurement: 37 cm From Medial Instep cm 32.5 cm Ankle Left: Right: Point of Measurement: 14 cm From Medial Instep cm 25.3 cm Vascular Assessment Pulses: Dorsalis Pedis Palpable: [Right:Yes] Posterior Tibial Extremity colors, hair growth, and conditions: Extremity Color: [Right:Hyperpigmented] Hair Growth on Extremity: [Right:No] Temperature of Extremity: [Right:Warm] Capillary Refill: [Right:< 3 seconds] Toe Nail Assessment Left: Right: Thick: Yes Discolored: No Deformed: No Improper Length and Hygiene: Yes Electronic Signature(s) Signed: 06/24/2018 4:53:18 PM By: Montey Hora Entered By: Montey Hora on 06/24/2018 12:48:44 Raymond Barnett ([id number]) -------------------------------------------------------------------------------- Multi Wound Chart Details Patient Name: Raymond Barnett Date of Service: 06/24/2018 12:45 PM Medical Record Number: [id number] Patient Account Number: [id number] Date  of Birth/Sex: 07-Sep-1929 (85 y.o. M) Treating RN: Cornell Barman Primary Care Tammela Bales: Nobie Putnam Other Clinician: Referring Jeanine Caven: Nobie Putnam Treating Temeca Somma/Extender: Tito Dine in Treatment: 26 Vital Signs Height(in): 70 Pulse(bpm): 81 Weight(lbs): 211 Blood Pressure(mmHg): 135/66 Body Mass Index(BMI): 30 Temperature(F): 97.9 Respiratory Rate 16 (breaths/min): Photos: [N/A:N/A] Wound Location: Right Lower Leg - Lateral N/A N/A Wounding Event: Other Lesion N/A N/A Primary Etiology: Venous Leg Ulcer N/A N/A Comorbid History: Glaucoma, Hypertension, N/A N/A Peripheral Venous Disease, Rheumatoid Arthritis Date Acquired: 11/20/2017 N/A N/A Weeks of Treatment: 26 N/A N/A Wound Status: Open N/A N/A Measurements L x W x D 0.6x0.5x0.2  N/A N/A (cm) Area (cm) : 0.236 N/A N/A Volume (cm) : 0.047 N/A N/A % Reduction in Area: 62.40% N/A N/A % Reduction in Volume: 62.70% N/A N/A Classification: Full Thickness Without N/A N/A Exposed Support Structures Exudate Amount: Medium N/A N/A Exudate Type: Serous N/A N/A Exudate Color: amber N/A N/A Wound Margin: Distinct, outline attached N/A N/A Granulation Amount: Medium (34-66%) N/A N/A Granulation Quality: Pale N/A N/A Necrotic Amount: Medium (34-66%) N/A N/A Exposed Structures: Fat Layer (Subcutaneous N/A N/A Tissue) Exposed: Yes Fascia: No Tendon: No Muscle: No GRIFFITH, SANTILLI ([id number]) Joint: No Bone: No Epithelialization: None N/A N/A Debridement: Debridement - Excisional N/A N/A Pre-procedure 13:10 N/A N/A Verification/Time Out Taken: Pain Control: Lidocaine N/A N/A Tissue Debrided: Subcutaneous, Slough N/A N/A Level: Skin/Subcutaneous Tissue N/A N/A Debridement Area (sq cm): 0.39 N/A N/A Instrument: Curette N/A N/A Bleeding: Minimum N/A N/A Hemostasis Achieved: Pressure N/A N/A Debridement Treatment Procedure was tolerated well N/A N/A Response: Post Debridement  0.6x0.5x0.2 N/A N/A Measurements L x W x D (cm) Post Debridement Volume: 0.047 N/A N/A (cm) Periwound Skin Texture: Excoriation: No N/A N/A Induration: No Callus: No Crepitus: No Rash: No Scarring: No Periwound Skin Moisture: Maceration: Yes N/A N/A Dry/Scaly: No Periwound Skin Color: Atrophie Blanche: No N/A N/A Cyanosis: No Ecchymosis: No Erythema: No Hemosiderin Staining: No Mottled: No Pallor: No Rubor: No Temperature: No Abnormality N/A N/A Tenderness on Palpation: No N/A N/A Wound Preparation: Ulcer Cleansing: N/A N/A Rinsed/Irrigated with Saline Topical Anesthetic Applied: Other: lidocaine 4% Procedures Performed: Debridement N/A N/A Treatment Notes Wound #1 (Right, Lateral Lower Leg) Notes Hydrofera Blue and bordered foam dressing Electronic Signature(s) Signed: 06/24/2018 5:05:37 PM By: Linton Ham MD Entered By: Linton Ham on 06/24/2018 13:23:55 CAMEREN, EARNEST ([id number]) DARRILL, VREELAND ([id number]) -------------------------------------------------------------------------------- Multi-Disciplinary Care Plan Details Patient Name: Raymond Barnett Date of Service: 06/24/2018 12:45 PM Medical Record Number: [id number] Patient Account Number: [id number] Date of Birth/Sex: [birth year] (85 y.o. M) Treating RN: Cornell Barman Primary Care Ennifer Harston: Nobie Putnam Other Clinician: Referring Kashif Pooler: Nobie Putnam Treating Ramsey Midgett/Extender: Tito Dine in Treatment: 52 Active Inactive Orientation to the Wound Care Program Nursing Diagnoses: Knowledge deficit related to the wound healing center program Goals: Patient/caregiver will verbalize understanding of the [place] Program Date Initiated: 12/24/2017 Target Resolution Date: 01/24/2018 Goal Status: Active Interventions: Provide education on orientation to the wound center Notes: Wound/Skin Impairment Nursing Diagnoses: Impaired tissue integrity Knowledge  deficit related to ulceration/compromised skin integrity Goals: Ulcer/skin breakdown will have a volume reduction of 30% by week 4 Date Initiated: 12/24/2017 Target Resolution Date: 01/24/2018 Goal Status: Active Interventions: Provide education on ulcer and skin care Treatment Activities: Topical wound management initiated : 12/24/2017 Notes: Electronic Signature(s) Signed: 06/24/2018 5:29:19 PM By: Gretta Cool, BSN, RN, CWS, Kim RN, BSN Entered By: Gretta Cool, BSN, RN, CWS, Kim on 06/24/2018 13:09:38 Raymond Barnett ([id number]) -------------------------------------------------------------------------------- Pain Assessment Details Patient Name: Raymond Barnett Date of Service: 06/24/2018 12:45 PM Medical Record Number: [id number] Patient Account Number: [id number] Date of Birth/Sex: 12-22-1929 (85 y.o. M) Treating RN: Montey Hora Primary Care Zyra Parrillo: Nobie Putnam Other Clinician: Referring Kaniya Trueheart: Nobie Putnam Treating Moraima Burd/Extender: Tito Dine in Treatment: 7 Active Problems Location of Pain Severity and Description of Pain Patient Has Paino No Site Locations Pain Management and Medication Current Pain Management: Electronic Signature(s) Signed: 06/24/2018 4:53:18 PM By: Montey Hora Entered By: Montey Hora on 06/24/2018 12:43:38 Raymond Barnett ([id number]) -------------------------------------------------------------------------------- Patient/Caregiver Education Details Patient Name: Raymond Barnett Date of Service:  06/24/2018 12:45 PM Medical Record Number: [id number] Patient Account Number: [id number] Date of Birth/Gender: May 25, 1929 (85 y.o. M) Treating RN: Cornell Barman Primary Care Physician: Nobie Putnam Other Clinician: Referring Physician: Nobie Putnam Treating Physician/Extender: Tito Dine in Treatment: 69 Education Assessment Education Provided To: Patient Education Topics Provided Wound/Skin  Impairment: Handouts: Caring for Your Ulcer Methods: Demonstration, Explain/Verbal Responses: State content correctly Electronic Signature(s) Signed: 06/24/2018 5:29:19 PM By: Gretta Cool, BSN, RN, CWS, Kim RN, BSN Entered By: Gretta Cool, BSN, RN, CWS, Kim on 06/24/2018 13:13:48 Raymond Barnett ([id number]) -------------------------------------------------------------------------------- Wound Assessment Details Patient Name: Raymond Barnett Date of Service: 06/24/2018 12:45 PM Medical Record Number: [id number] Patient Account Number: [id number] Date of Birth/Sex: 12/30/1929 (85 y.o. M) Treating RN: Montey Hora Primary Care Icker Swigert: Nobie Putnam Other Clinician: Referring Kathryn Linarez: Nobie Putnam Treating Rosey Eide/Extender: Tito Dine in Treatment: 26 Wound Status Wound Number: 1 Primary Venous Leg Ulcer Etiology: Wound Location: Right Lower Leg - Lateral Wound Open Wounding Event: Other Lesion Status: Date Acquired: 11/20/2017 Comorbid Glaucoma, Hypertension, Peripheral Venous Weeks Of Treatment: 26 History: Disease, Rheumatoid Arthritis Clustered Wound: No Photos Wound Measurements Length: (cm) 0.6 Width: (cm) 0.5 Depth: (cm) 0.2 Area: (cm) 0.236 Volume: (cm) 0.047 % Reduction in Area: 62.4% % Reduction in Volume: 62.7% Epithelialization: None Tunneling: No Undermining: No Wound Description Full Thickness Without Exposed Support Foul Odo Classification: Structures Slough/F Wound Margin: Distinct, outline attached Exudate Medium Amount: Exudate Type: Serous Exudate Color: amber r After Cleansing: No ibrino Yes Wound Bed Granulation Amount: Medium (34-66%) Exposed Structure Granulation Quality: Pale Fascia Exposed: No Necrotic Amount: Medium (34-66%) Fat Layer (Subcutaneous Tissue) Exposed: Yes Necrotic Quality: Adherent Slough Tendon Exposed: No Muscle Exposed: No Joint Exposed: No Bone Exposed: No Periwound Skin Texture CAMILO, MANDER ([id number]) Texture Color No Abnormalities Noted: No No Abnormalities Noted: No Callus: No Atrophie Blanche: No Crepitus: No Cyanosis: No Excoriation: No Ecchymosis: No Induration: No Erythema: No Rash: No Hemosiderin Staining: No Scarring: No Mottled: No Pallor: No Moisture Rubor: No No Abnormalities Noted: No Dry / Scaly: No Temperature / Pain Maceration: Yes Temperature: No Abnormality Wound Preparation Ulcer Cleansing: Rinsed/Irrigated with Saline Topical Anesthetic Applied: Other: lidocaine 4%, Treatment Notes Wound #1 (Right, Lateral Lower Leg) Notes Hydrofera Blue and bordered foam dressing Electronic Signature(s) Signed: 06/24/2018 4:53:18 PM By: Montey Hora Signed: 06/24/2018 5:29:19 PM By: Gretta Cool, BSN, RN, CWS, Kim RN, BSN Entered By: Gretta Cool, BSN, RN, CWS, Kim on 06/24/2018 13:13:01 Raymond Barnett ([id number]) -------------------------------------------------------------------------------- Vitals Details Patient Name: Raymond Barnett Date of Service: 06/24/2018 12:45 PM Medical Record Number: [id number] Patient Account Number: [id number] Date of Birth/Sex: 1929-09-28 (85 y.o. M) Treating RN: Montey Hora Primary Care Spiros Greenfeld: Nobie Putnam Other Clinician: Referring Kaydra Borgen: Nobie Putnam Treating Natayla Cadenhead/Extender: Tito Dine in Treatment: 26 Vital Signs Time Taken: 12:44 Temperature (F): 97.9 Height (in): 70 Pulse (bpm): 81 Weight (lbs): 211 Respiratory Rate (breaths/min): 16 Body Mass Index (BMI): 30.3 Blood Pressure (mmHg): 135/66 Reference Range: 80 - 120 mg / dl Electronic Signature(s) Signed: 06/24/2018 4:53:18 PM By: Montey Hora Entered By: Montey Hora on 06/24/2018 12:46:07

## 2018-07-10 ENCOUNTER — Ambulatory Visit: Payer: Medicare Other | Admitting: Internal Medicine

## 2018-07-22 ENCOUNTER — Ambulatory Visit: Payer: Medicare Other | Admitting: Internal Medicine

## 2018-08-05 ENCOUNTER — Telehealth: Payer: Self-pay | Admitting: *Deleted

## 2018-08-05 NOTE — Telephone Encounter (Signed)
Spoke with pt concerning upcoming OV w/Dr.End. Pt is aware that provider with not be seeing any pt's in the office due to covid19. Pt refused virtual visit and would like to wait until pt is able to be seen in office. Pt is aware that it may take several months before he can be seen.

## 2018-08-26 ENCOUNTER — Ambulatory Visit: Payer: Medicare Other | Admitting: Internal Medicine

## 2018-09-14 ENCOUNTER — Telehealth: Payer: Self-pay | Admitting: Internal Medicine

## 2018-09-15 NOTE — Telephone Encounter (Signed)
I would send in the refill.  Whether or not he decides to pick it up/take it is up to him and can be readdressed when he follows up.  Thanks.  Nelva Bush, MD [facility] Pager: [phone]

## 2018-09-15 NOTE — Telephone Encounter (Signed)
Please review for refill. Patient not currently taking Crestor.

## 2018-09-15 NOTE — Telephone Encounter (Signed)
RX refill sent to the pharmacy.

## 2018-09-15 NOTE — Telephone Encounter (Signed)
[name],  A refill request came in on this patient's Crestor 5 mg tablets.  You saw him on 04/01/18 and he was taking this and recommended to continue due to PAD.  Per OV note from Dr. Byrd Hesselbach on 05/26/18:  "He has declined angiography and catheterization. Also has self discontinued Rosuvastatin, Metoprolol.  Discussed recommendations for continuing these medicines are to treat these problems and prevent future issue, but he declines this intervention. Advised him to follow-up discussion with Dr End."  Lenda Kelp spoke with him on 08/05/18 regarding his follow up and he refused a virtual visit.   I know he should be on a statin, and we can send in the refill, but it doesn't mean he will pick it up and take it, even if we call him and tell him what he has already been advised from his PCP.   He currently has no follow up scheduled. Do you want [place] to hold off on the refill and try to get him in for an in office visit with you?

## 2018-09-16 ENCOUNTER — Other Ambulatory Visit: Payer: Self-pay

## 2018-09-16 ENCOUNTER — Encounter: Payer: Medicare Other | Attending: Internal Medicine | Admitting: Internal Medicine

## 2018-09-16 DIAGNOSIS — L97319 Non-pressure chronic ulcer of right ankle with unspecified severity: Secondary | ICD-10-CM | POA: Diagnosis present

## 2018-09-16 DIAGNOSIS — I70233 Atherosclerosis of native arteries of right leg with ulceration of ankle: Secondary | ICD-10-CM | POA: Insufficient documentation

## 2018-09-16 DIAGNOSIS — L97312 Non-pressure chronic ulcer of right ankle with fat layer exposed: Secondary | ICD-10-CM | POA: Diagnosis not present

## 2018-09-16 DIAGNOSIS — I872 Venous insufficiency (chronic) (peripheral): Secondary | ICD-10-CM | POA: Diagnosis not present

## 2018-09-16 DIAGNOSIS — L97311 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 of skin: Secondary | ICD-10-CM | POA: Insufficient documentation

## 2018-09-16 DIAGNOSIS — I10 Essential (primary) hypertension: Secondary | ICD-10-CM | POA: Insufficient documentation

## 2018-09-23 NOTE — Progress Notes (Signed)
VENCE, LALOR ([id number]) Visit Report for 09/16/2018 HPI Details Patient Name: Raymond Barnett, Raymond Barnett Date of Service: 09/16/2018 4:00 PM Medical Record Number: [id number] Patient Account Number: [id number] Date of Birth/Sex: 1929-10-11 (85 y.o. M) Treating RN: Cornell Barman Primary Care Provider: Nobie Putnam Other Clinician: Referring Provider: Nobie Putnam Treating Provider/Extender: Tito Dine in Treatment: 37 History of Present Illness Severity: furthermore the TBI was 0.33 on the right HPI Description: ADMISSION 12/24/17 This is an 85 year old man who arrives for review of wound on his right lateral ankle area. He states this happened about a year ago when he picked the scab off the area and the wound opened and never really healed. He has been followed by Dr. Governor Specking at [facility] wound care center. He is apparently used a multitude of topical dressings as well as skin substitutes however I'm not certain at this point with scattered skin substitutes were used. His last visit there was on 10/09/17 he's been using sorbact with hydrogel and a covering dressing. He states the wound is actually gotten better with less depth and wound volume. He is frustrated by the slowness of the healing and he is coming here for review. He also lives in [place] and this is a lot closer for him in terms of traveling. I have not had a chance to look through all of Dr. Samule Ohm notes however the wound was listed as a venous ulcer at one point. He had an x-ray done in December 2018 that did not show evidence of osteomyelitis. According to the patient osteomyelitis was ruled out. The patient actually has a complex history in this area. He suffered a serious fracture in the area perhaps 60 years ago while he was in the [employer] when [employer] over his ankle. He had a multitude of surgeries, bone graft and ultimately an ankle fusion. Apparently all the hardware however  has been taken out of the ankle quite a while ago. He does not however have a prior wound history. The patient also saw his cardiologist in May who appropriately ordered vascular tests. This showed an ABI in the right of 0.6 on the left of 1.07. This showed monophasic waveforms and all of the tibial vessels. 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 Also at 30-49% stenosis of the distal popliteal. furthermore TBI was 0.33 on the right which is markedly reduced. Looking at Dr. Marisue Humble Notes the patient refused to see an interventionalist 12/31/17; right lateral ankle wound.. He sees Dr. and his cardiologist this afternoon and they will discuss further peripheral artery evaluation. He is using Endoform. 01/07/18; right lateral ankle wound. He saw [name] was his cardiologist. He seems to be of the opinion that the patient has critical limb ischemia as a cause for nonhealing of this area. I certainly agree with that. Nevertheless the patient will not agree to an invasive test i.e. an angiogram. Therefore we are left with attempting wound care. We are using Endoform. His wife changes the dressings 01/21/18; right ankle wound just below the right lateral malleolus. Wound looks about the same. Surface does not look as bad as when he first came in here. There is a small divot a roughly 10:00 but I cannot feel bone. Using Endoform 02/04/18; right ankle wound just at the level of the right lateral malleolus. It would appear the area has expanded somewhat in length. 2 small open areas with the bridge of normal skin. There is no evidence of surrounding infection. He still is  very reluctant to undergo any invasive vascular assessment. His ABI on the right was 0.6 with monophasic waveforms in all tibial vessels. He still does not want to see an interventional list 03/04/18; monthly visit/palliative visit. This is a patient who has an open area on the level of the right lateral malleolus. He is underlying trauma to  the area and probably significant PAD. He does not want any vascular interventions in spite of efforts by myself and his interventional cardiologist [name]. He states he is improved he thinks the wound is smaller and is certainly less painful 04/01/18 Patient I see on a palliative basis. He has a small open area at the level of the right lateral malleolus. There is undermining superiorly of roughly 0.4 cm. I think this is an ischemic wound but he does not want any interventions Raymond Barnett, Raymond Barnett ([id number]) 04/29/2018; patient I see on a monthly palliative basis. He has small open areas at the level of the right lateral malleolus. We have been using silver collagen. Some improvement, there is no longer undermining. The wound may have developed into 2 small open areas separated by a bridge of epithelialization 2/5; patient I see on a monthly palliative basis. Right lateral malleolus. Not much change from a month ago. We have been using Prisma I changed him to [facility] today. If this is not going to do any different I simply discharge him on palliative use of silver nitrate or topical antibiotic ointment 3/4; patient I see on a monthly palliative basis. He has a ischemic wound in the right lateral malleolus. We have been using Hydrofera Blue. Paradoxically this actually looks quite a bit better today. Large part of his original wound appears to be epithelialized. He still has a arc above the area that is still open with a necrotic surface. 5/27; palliative basis review. Probably an ischemic wound on the right lateral malleolus we have been using Hydrofera Blue. We have not seen him since the beginning of the Covid pandemic. Electronic Signature(s) Signed: 09/16/2018 5:01:45 PM By: Linton Ham MD Entered By: Linton Ham on 09/16/2018 16:47:07 Raymond Barnett ([id number]) -------------------------------------------------------------------------------- Physical Exam Details Patient Name:  Raymond Barnett Date of Service: 09/16/2018 4:00 PM Medical Record Number: [id number] Patient Account Number: [id number] Date of Birth/Sex: April 24, 1929 (85 y.o. M) Treating RN: Cornell Barman Primary Care Provider: Nobie Putnam Other Clinician: Referring Provider: Nobie Putnam Treating Provider/Extender: Tito Dine in Treatment: 51 Constitutional Patient is hypertensive.. Pulse regular and within target range for patient.Marland Kitchen Respirations regular, non-labored and within target range.. Temperature is normal and within the target range for the patient.Marland Kitchen appears in no distress. Respiratory . Notes Wound exam; the small wound on the right lateral malleolus on the right is no longer visible. About a quarter sized area of tight eschar over the wound surface. There is no drainage no evidence of infection. Electronic Signature(s) Signed: 09/16/2018 5:01:45 PM By: Linton Ham MD Entered By: Linton Ham on 09/16/2018 16:48:19 Raymond Barnett ([id number]) -------------------------------------------------------------------------------- Physician Orders Details Patient Name: Raymond Barnett Date of Service: 09/16/2018 4:00 PM Medical Record Number: [id number] Patient Account Number: [id number] Date of Birth/Sex: [birth year] (85 y.o. M) Treating RN: Cornell Barman Primary Care Provider: Nobie Putnam Other Clinician: Referring Provider: Nobie Putnam Treating Provider/Extender: Tito Dine in Treatment: 51 Verbal / Phone Orders: No Diagnosis Coding Wound Cleansing Wound #1 Right,Lateral Lower Leg o Clean wound with Normal Saline. Anesthetic (add to Medication List) Wound #1 Right,Lateral Lower Leg o  Topical Lidocaine 4% cream applied to wound bed prior to debridement (In Clinic Only). Primary Wound Dressing Wound #1 Right,Lateral Lower Leg o Hydrafera Blue Ready Transfer Secondary Dressing Wound #1 Right,Lateral Lower Leg o  Boardered Foam Dressing Dressing Change Frequency Wound #1 Right,Lateral Lower Leg o Change dressing every week Follow-up Appointments Wound #1 Right,Lateral Lower Leg o Other: - if needed Edema Control Wound #1 Right,Lateral Lower Leg o Elevate legs to the level of the heart and pump ankles as often as possible Discharge From [facility] Services Wound #1 Right,Lateral Lower Leg o Discharge from [place] - Patient will call if needed. Electronic Signature(s) Signed: 09/16/2018 5:01:45 PM By: Linton Ham MD Signed: 09/22/2018 5:54:26 PM By: Gretta Cool, BSN, RN, CWS, Kim RN, BSN Entered By: Gretta Cool, BSN, RN, CWS, Kim on 09/16/2018 16:40:56 Raymond Barnett, Raymond Barnett ([id number]) -------------------------------------------------------------------------------- Problem List Details Patient Name: Raymond Barnett Date of Service: 09/16/2018 4:00 PM Medical Record Number: [id number] Patient Account Number: [id number] Date of Birth/Sex: [birth year] (85 y.o. 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 M) Treating RN: Cornell Barman Primary Care Provider: Nobie Putnam Other Clinician: Referring Provider: Nobie Putnam Treating Provider/Extender: Tito Dine in Treatment: 38 Subjective History of Present Illness (HPI) The following HPI elements were documented for the patient's wound: Severity: furthermore the TBI was 0.33 on the right ADMISSION 12/24/17 This is an 85 year old man who arrives for review of wound on his right lateral ankle area. He states this happened about a year ago when he picked the scab off the area and the wound opened and never really healed. He has been followed by Dr. Governor Specking at [facility] wound care center. He is apparently used a multitude of topical dressings as well as skin substitutes however I'm not certain at this point with scattered skin substitutes were used. His last visit there was on 10/09/17 he's been using sorbact with hydrogel and a covering dressing. He states the wound is actually gotten better with less depth and wound volume. He is frustrated by the slowness of the healing and he is coming here for review. He also lives in [place] and this is a lot closer for him in terms of traveling. I have not had a chance to look through all of Dr. Samule Ohm notes however the wound was listed as a venous ulcer at one point. He had an x-ray done in December 2018 that did not show evidence of osteomyelitis. According to the patient osteomyelitis was ruled out. The patient actually has a complex history in this area. He suffered a serious fracture in the area perhaps 60 years ago while he was in the [employer] when [employer] over his ankle. He had a multitude of surgeries, bone graft and ultimately an ankle fusion. Apparently all the hardware however has been taken out of the ankle  quite a while ago. He does not however have a prior wound history. The patient also saw his cardiologist in May who appropriately ordered  vascular tests. This showed an ABI in the right of 0.6 on the left of 1.07. This showed monophasic waveforms and all of the tibial vessels. 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 Also at 30-49% stenosis of the distal popliteal. furthermore TBI was 0.33 on the right which is markedly reduced. Looking at Dr. Marisue Humble Notes the patient refused to see an interventionalist 12/31/17; right lateral ankle wound.. He sees Dr. and his cardiologist this afternoon and they will discuss further peripheral artery evaluation. He is using Endoform. 01/07/18; right lateral ankle wound. He saw [name] was his cardiologist. He seems to be of the opinion that the patient has critical limb ischemia as a cause for nonhealing of this area. I certainly agree with that. Nevertheless the patient will not agree to an invasive test i.e. an angiogram. Therefore we are left with attempting wound care. We are using Endoform. His wife changes the dressings 01/21/18; right ankle wound just below the right lateral malleolus. Wound looks about the same. Surface does not look as bad as when he first came in here. There is a small divot a roughly 10:00 but I cannot feel bone. Using Endoform 02/04/18; right ankle wound just at the level of the right lateral malleolus. It would appear the area has expanded somewhat in length. 2 small open areas with the bridge of normal skin. There is no evidence of surrounding infection. He still is very reluctant to undergo any invasive vascular assessment. His ABI on the right was 0.6 with monophasic waveforms in all tibial vessels. He still does not want to see an interventional list 03/04/18; monthly visit/palliative visit. This is a patient who has an open area on the level of the right lateral malleolus. He is underlying trauma to the area and probably significant PAD. He does not want any vascular interventions in spite of efforts by myself and his interventional cardiologist [name]. He states he  is improved he thinks the wound is smaller and is certainly less painful 04/01/18 Patient I see on a palliative basis. He has a small open area at the level of the right lateral malleolus. There is undermining superiorly of roughly 0.4 cm. I think this is an ischemic wound but he does not want any interventions 04/29/2018; patient I see on a monthly palliative basis. He has small open areas at the level of the right lateral malleolus. We Raymond Barnett, Raymond Barnett ([id number]) have been using silver collagen. Some improvement, there is no longer undermining. The wound may have developed into 2 small open areas separated by a bridge of epithelialization 2/5; patient I see on a monthly palliative basis. Right lateral malleolus. Not much change from a month ago. We have been using Prisma I changed him to [facility] today. If this is not going to do any different I simply discharge him on palliative use of silver nitrate or topical antibiotic ointment 3/4; patient I see on a monthly palliative basis. He has a ischemic wound in the right lateral malleolus. We have been using Hydrofera Blue. Paradoxically this actually looks quite a bit better today. Large part of his original wound appears to be epithelialized. He still has a arc above the area that is still open with a necrotic surface. 5/27; palliative basis review. Probably an ischemic wound on the right lateral malleolus we have been using Hydrofera  Blue. We have not seen him since the beginning of the Covid pandemic. Objective Constitutional Patient is hypertensive.. Pulse regular and within target range for patient.Marland Kitchen Respirations regular, non-labored and within target range.. Temperature is normal and within the target range for the patient.Marland Kitchen appears in no distress. Vitals Time Taken: 4:26 PM, Height: 70 in, Weight: 211 lbs, BMI: 30.3, Temperature: 98.3 F, Pulse: 90 bpm, Respiratory Rate: 18 breaths/min, Blood Pressure: 162/79 mmHg. General Notes:  Wound exam; the small wound on the right lateral malleolus on the right is no longer visible. About a quarter sized area of tight eschar over the wound surface. There is no drainage no evidence of infection. Integumentary (Hair, Skin) Wound #1 status is Open. Original cause of wound was Other Lesion. The wound is located on the Right,Lateral Lower Leg. The wound measures 0.6cm length x 0.5cm width x 0.2cm depth; 0.236cm^2 area and 0.047cm^3 volume. There is Fat Layer (Subcutaneous Tissue) Exposed exposed. There is no tunneling or undermining noted. There is a medium amount of serous drainage noted. The wound margin is distinct with the outline attached to the wound base. There is medium (34-66%) pale granulation within the wound bed. There is a medium (34-66%) amount of necrotic tissue within the wound bed including Adherent Slough. 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 Anesthetic (add to Medication List): Wound #1 Right,Lateral Lower Leg: Topical Lidocaine 4% cream applied to wound bed prior to debridement (In Clinic Only). Primary Wound Dressing: Wound #1 Right,Lateral Lower Leg: Hydrafera Blue Ready Transfer Secondary Dressing: Wound #1 Right,Lateral Lower Leg: Boardered Foam Dressing Dressing Change Frequency: Wound #1 Right,Lateral Lower Leg: Change dressing every week Follow-up Appointments: Wound #1 Right,Lateral Lower Leg: Other: - if needed Edema Control: Wound #1 Right,Lateral Lower Leg: Elevate legs to the level of the heart and pump ankles as often as possible Discharge From [facility] Services: Wound #1 Right,Lateral Lower Leg: Discharge from [place] - Patient will call if needed. 1. I discussed the approach  to the eschar with the patient. Normally I might consider removing this and seeing if this is truly healed however the patient did not seem to be interested in this. Is not specifically dressing this area. I did explain to him that if you remove the eschar from the surface there may be an open wound here. He stated he would call us if this happens. 2. I think he can be discharged therefore to his own follow-up. 3. The patient has significant PAD based on previous noninvasive studies however he elected not to have any attempt at revascularization Electronic Signature(s) Signed: 09/16/2018 5:01:45 PM By: Linton Ham MD Entered By: Linton Ham on 09/16/2018 16:50:49 Raymond Barnett ([id number]) -------------------------------------------------------------------------------- [place] Details Patient Name: Raymond Barnett Date of Service: 09/16/2018 Medical Record Number: [id number] Patient Account Number: [id number] Date of Birth/Sex: 1929-12-01 (85 y.o. 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

## 2018-09-23 NOTE — Progress Notes (Signed)
COLBI, STAUBS ([id number]) Visit Report for 09/16/2018 Arrival Information Details Patient Name: Raymond Barnett, Raymond Barnett Date of Service: 09/16/2018 4:00 PM Medical Record Number: [id number] Patient Account Number: [id number] Date of Birth/Sex: 02-10-1930 (85 y.o. M) Treating RN: Cornell Barman Primary Care Val Schiavo: Nobie Putnam Other Clinician: Referring Jamin Humphries: Nobie Putnam Treating Angelina Neece/Extender: Tito Dine in Treatment: 55 Visit Information History Since Last Visit Added or deleted any medications: No Patient Arrived: Cane Has Dressing in Place as Prescribed: Yes Arrival Time: 16:25 Pain Present Now: No Accompanied By: self Transfer Assistance: None Patient Identification Verified: Yes Electronic Signature(s) Signed: 09/22/2018 5:54:26 PM By: Gretta Cool, BSN, RN, CWS, Kim RN, BSN Entered By: Gretta Cool, BSN, RN, CWS, Kim on 09/16/2018 16:26:04 Raymond Barnett ([id number]) -------------------------------------------------------------------------------- Clinic Level of Care Assessment Details Patient Name: Raymond Barnett Date of Service: 09/16/2018 4:00 PM Medical Record Number: [id number] Patient Account Number: [id number] Date of Birth/Sex: 1929/11/17 (85 y.o. M) Treating RN: Cornell Barman Primary Care Artrice Kraker: Nobie Putnam Other Clinician: Referring Larwence Tu: Nobie Putnam Treating Camika Marsico/Extender: Tito Dine in Treatment: 61 Clinic Level of Care Assessment Items TOOL 4 Quantity Score []  - Use when only an EandM is performed on FOLLOW-UP visit 0 ASSESSMENTS - Nursing Assessment / Reassessment X - Reassessment of Co-morbidities (includes updates in patient status) 1 10 X- 1 5 Reassessment of Adherence to Treatment Plan ASSESSMENTS - Wound and Skin Assessment / Reassessment X - Simple Wound Assessment / Reassessment - one wound 1 5 []  - 0 Complex Wound Assessment / Reassessment - multiple wounds []  - 0 Dermatologic /  Skin Assessment (not related to wound area) ASSESSMENTS - Focused Assessment []  - Circumferential Edema Measurements - multi extremities 0 []  - 0 Nutritional Assessment / Counseling / Intervention []  - 0 Lower Extremity Assessment (monofilament, tuning fork, pulses) []  - 0 Peripheral Arterial Disease Assessment (using hand held doppler) ASSESSMENTS - Ostomy and/or Continence Assessment and Care []  - Incontinence Assessment and Management 0 []  - 0 Ostomy Care Assessment and Management (repouching, etc.) PROCESS - Coordination of Care X - Simple Patient / Family Education for ongoing care 1 15 []  - 0 Complex (extensive) Patient / Family Education for ongoing care X- 1 10 Staff obtains Consents, Records, Test Results / Process Orders []  - 0 Staff telephones HHA, Nursing Homes / Clarify orders / etc []  - 0 Routine Transfer to another Facility (non-emergent condition) []  - 0 Routine Hospital Admission (non-emergent condition) []  - 0 New Admissions / Biomedical engineer / Ordering NPWT, Apligraf, etc. []  - 0 Emergency Hospital Admission (emergent condition) X- 1 10 Simple Discharge Coordination Raymond Barnett, Raymond Barnett ([id number]) []  - 0 Complex (extensive) Discharge Coordination PROCESS - Special Needs []  - Pediatric / Minor Patient Management 0 []  - 0 Isolation Patient Management []  - 0 Hearing / Language / Visual special needs []  - 0 Assessment of Community assistance (transportation, D/C planning, etc.) []  - 0 Additional assistance / Altered mentation []  - 0 Support Surface(s) Assessment (bed, cushion, seat, etc.) INTERVENTIONS - Wound Cleansing / Measurement X - Simple Wound Cleansing - one wound 1 5 []  - 0 Complex Wound Cleansing - multiple wounds []  - 0 Wound Imaging (photographs - any number of wounds) []  - 0 Wound Tracing (instead of photographs) X- 1 5 Simple Wound Measurement - one wound []  - 0 Complex Wound Measurement - multiple wounds INTERVENTIONS -  Wound Dressings []  - Small Wound Dressing one or multiple wounds 0 X- 1 15 Medium Wound Dressing one or multiple  wounds []  - 0 Large Wound Dressing one or multiple wounds []  - 0 Application of Medications - topical []  - 0 Application of Medications - injection INTERVENTIONS - Miscellaneous []  - External ear exam 0 []  - 0 Specimen Collection (cultures, biopsies, blood, body fluids, etc.) []  - 0 Specimen(s) / Culture(s) sent or taken to Lab for analysis []  - 0 Patient Transfer (multiple staff / Harrel Lemon Lift / Similar devices) []  - 0 Simple Staple / Suture removal (25 or less) []  - 0 Complex Staple / Suture removal (26 or more) []  - 0 Hypo / Hyperglycemic Management (close monitor of Blood Glucose) []  - 0 Ankle / Brachial Index (ABI) - do not check if billed separately []  - 0 Vital Signs Raymond Barnett, Raymond Barnett ([id number]) Has the patient been seen at the hospital within the last three years: Yes Total Score: 80 Level Of Care: New/Established - Level 3 Electronic Signature(s) Signed: 09/22/2018 5:54:26 PM By: Gretta Cool, BSN, RN, CWS, Kim RN, BSN Entered By: Gretta Cool, BSN, RN, CWS, Kim on 09/16/2018 16:41:45 Raymond Barnett ([id number]) -------------------------------------------------------------------------------- Encounter Discharge Information Details Patient Name: Raymond Barnett Date of Service: 09/16/2018 4:00 PM Medical Record Number: [id number] Patient Account Number: [id number] Date of Birth/Sex: 03-Jun-1929 (85 y.o. M) Treating RN: Cornell Barman Primary Care Delta Deshmukh: Nobie Putnam Other Clinician: Referring Johnye Kist: Nobie Putnam Treating Kiandra Sanguinetti/Extender: Tito Dine in Treatment: 36 Encounter Discharge Information Items Discharge Condition: Stable Ambulatory Status: Cane Discharge Destination: Home Transportation: Private Auto Accompanied By: self Schedule Follow-up Appointment: Yes Clinical Summary of Care: Electronic Signature(s) Signed:  09/22/2018 5:54:26 PM By: Gretta Cool, BSN, RN, CWS, Kim RN, BSN Entered By: Gretta Cool, BSN, RN, CWS, Kim on 09/16/2018 16:44:52 Raymond Barnett ([id number]) -------------------------------------------------------------------------------- Lower Extremity Assessment Details Patient Name: Raymond Barnett Date of Service: 09/16/2018 4:00 PM Medical Record Number: [id number] Patient Account Number: [id number] Date of Birth/Sex: 1929-05-13 (85 y.o. M) Treating RN: Cornell Barman Primary Care Dondi Burandt: Nobie Putnam Other Clinician: Referring Elery Cadenhead: Nobie Putnam Treating Helios Kohlmann/Extender: Tito Dine in Treatment: 77 Vascular Assessment Pulses: Dorsalis Pedis Palpable: [Right:Yes] Posterior Tibial Palpable: [Right:Yes] Electronic Signature(s) Signed: 09/22/2018 5:54:26 PM By: Gretta Cool, BSN, RN, CWS, Kim RN, BSN Entered By: Gretta Cool, BSN, RN, CWS, Kim on 09/16/2018 16:30:45 Raymond Barnett ([id number]) -------------------------------------------------------------------------------- Multi Wound Chart Details Patient Name: Raymond Barnett Date of Service: 09/16/2018 4:00 PM Medical Record Number: [id number] Patient Account Number: [id number] Date of Birth/Sex: 1930-01-02 (85 y.o. M) Treating RN: Cornell Barman Primary Care Lilyannah Zuelke: Nobie Putnam Other Clinician: Referring Arli Bree: Nobie Putnam Treating Future Yeldell/Extender: Tito Dine in Treatment: 54 Vital Signs Height(in): 70 Pulse(bpm): 90 Weight(lbs): 211 Blood Pressure(mmHg): 162/79 Body Mass Index(BMI): 30 Temperature(F): 98.3 Respiratory Rate 18 (breaths/min): Photos: [N/A:N/A] Wound Location: Right Lower Leg - Lateral N/A N/A Wounding Event: Other Lesion N/A N/A Primary Etiology: Venous Leg Ulcer N/A N/A Comorbid History: Glaucoma, Hypertension, N/A N/A Peripheral Venous Disease, Rheumatoid Arthritis Date Acquired: 11/20/2017 N/A N/A Weeks of Treatment: 38 N/A N/A Wound Status:  Open N/A N/A Measurements L x W x D 0.6x0.5x0.2 N/A N/A (cm) Area (cm) : 0.236 N/A N/A Volume (cm) : 0.047 N/A N/A % Reduction in Area: 62.40% N/A N/A % Reduction in Volume: 62.70% N/A N/A Classification: Full Thickness Without N/A N/A Exposed Support Structures Exudate Amount: Medium N/A N/A Exudate Type: Serous N/A N/A Exudate Color: amber N/A N/A Wound Margin: Distinct, outline attached N/A N/A Granulation Amount: Medium (34-66%) N/A N/A Granulation Quality: Pale N/A N/A Necrotic Amount: Medium (34-66%) N/A N/A  Exposed Structures: Fat Layer (Subcutaneous N/A N/A Tissue) Exposed: Yes Fascia: No Tendon: No Muscle: No Raymond Barnett, Raymond Barnett ([id number]) Joint: No Bone: No Epithelialization: None N/A N/A Treatment Notes Wound #1 (Right, Lateral Lower Leg) Notes Bordered foam dressing for protection Electronic Signature(s) Signed: 09/16/2018 5:01:45 PM By: Linton Ham MD Entered By: Linton Ham on 09/16/2018 16:45:57 Raymond Barnett ([id number]) -------------------------------------------------------------------------------- Multi-Disciplinary Care Plan Details Patient Name: Raymond Barnett Date of Service: 09/16/2018 4:00 PM Medical Record Number: [id number] Patient Account Number: [id number] Date of Birth/Sex: Jan 08, 1930 (85 y.o. M) Treating RN: Cornell Barman Primary Care Nikiyah Fackler: Nobie Putnam Other Clinician: Referring Mackinley Kiehn: Nobie Putnam Treating Amaryllis Malmquist/Extender: Tito Dine in Treatment: 36 Active Inactive Electronic Signature(s) Signed: 09/16/2018 5:23:59 PM By: Gretta Cool, BSN, RN, CWS, Kim RN, BSN Entered By: Gretta Cool, BSN, RN, CWS, Kim on 09/16/2018 17:23:59 Raymond Barnett ([id number]) -------------------------------------------------------------------------------- Pain Assessment Details Patient Name: Raymond Barnett Date of Service: 09/16/2018 4:00 PM Medical Record Number: [id number] Patient Account Number: [id number] Date of  Birth/Sex: 1930-02-03 (85 y.o. M) Treating RN: Cornell Barman Primary Care Dayna Alia: Nobie Putnam Other Clinician: Referring Tarius Stangelo: Nobie Putnam Treating Marley Pakula/Extender: Tito Dine in Treatment: 79 Active Problems Location of Pain Severity and Description of Pain Patient Has Paino No Site Locations Pain Management and Medication Current Pain Management: Electronic Signature(s) Signed: 09/22/2018 5:54:26 PM By: Gretta Cool, BSN, RN, CWS, Kim RN, BSN Entered By: Gretta Cool, BSN, RN, CWS, Kim on 09/16/2018 16:26:23 Raymond Barnett ([id number]) -------------------------------------------------------------------------------- Patient/Caregiver Education Details Patient Name: Raymond Barnett Date of Service: 09/16/2018 4:00 PM Medical Record Number: [id number] Patient Account Number: [id number] Date of Birth/Gender: [birth year] (85 y.o. M) Treating RN: Cornell Barman Primary Care Physician: Nobie Putnam Other Clinician: Referring Physician: Nobie Putnam Treating Physician/Extender: Tito Dine in Treatment: 67 Education Assessment Education Provided To: Patient Education Topics Provided Wound/Skin Impairment: Handouts: Caring for Your Ulcer, Other: keep covered Methods: Explain/Verbal Responses: State content correctly Electronic Signature(s) Signed: 09/22/2018 5:54:26 PM By: Gretta Cool, BSN, RN, CWS, Kim RN, BSN Entered By: Gretta Cool, BSN, RN, CWS, Kim on 09/16/2018 16:43:55 Raymond Barnett ([id number]) -------------------------------------------------------------------------------- Wound Assessment Details Patient Name: Raymond Barnett Date of Service: 09/16/2018 4:00 PM Medical Record Number: [id number] Patient Account Number: [id number] Date of Birth/Sex: 05-Jun-1929 (85 y.o. M) Treating RN: Cornell Barman Primary Care Aadyn Buchheit: Nobie Putnam Other Clinician: Referring Delanna Blacketer: Nobie Putnam Treating Daoud Lobue/Extender:  Tito Dine in Treatment: 49 Wound Status Wound Number: 1 Primary Venous Leg Ulcer Etiology: Wound Location: Right Lower Leg - Lateral Wound Open Wounding Event: Other Lesion Status: Date Acquired: 11/20/2017 Comorbid Glaucoma, Hypertension, Peripheral Venous Weeks Of Treatment: 38 History: Disease, Rheumatoid Arthritis Clustered Wound: No Photos Wound Measurements Length: (cm) 0.6 Width: (cm) 0.5 Depth: (cm) 0.2 Area: (cm) 0.236 Volume: (cm) 0.047 % Reduction in Area: 62.4% % Reduction in Volume: 62.7% Epithelialization: None Tunneling: No Undermining: No Wound Description Full Thickness Without Exposed Support Foul Odo Classification: Structures Slough/F Wound Margin: Distinct, outline attached Exudate Medium Amount: Exudate Type: Serous Exudate Color: amber r After Cleansing: No ibrino Yes Wound Bed Granulation Amount: Medium (34-66%) Exposed Structure Granulation Quality: Pale Fascia Exposed: No Necrotic Amount: Medium (34-66%) Fat Layer (Subcutaneous Tissue) Exposed: Yes Necrotic Quality: Adherent Slough Tendon Exposed: No Muscle Exposed: No Joint Exposed: No Bone Exposed: No Raymond Barnett, Raymond Barnett ([id number]) Electronic Signature(s) Signed: 09/22/2018 5:54:26 PM By: Gretta Cool, BSN, RN, CWS, Kim RN, BSN Entered By: Gretta Cool, BSN, RN, CWS, Kim on 09/16/2018 16:30:23 Raymond Barnett ([id number]) -------------------------------------------------------------------------------- Vitals Details  Patient Name: Raymond Barnett, Raymond Barnett Date of Service: 09/16/2018 4:00 PM Medical Record Number: [id number] Patient Account Number: [id number] Date of Birth/Sex: May 23, 1929 (85 y.o. M) Treating RN: Cornell Barman Primary Care Kellin Fifer: Nobie Putnam Other Clinician: Referring Caralina Nop: Nobie Putnam Treating Etosha Wetherell/Extender: Tito Dine in Treatment: 69 Vital Signs Time Taken: 16:26 Temperature (F): 98.3 Height (in): 70 Pulse (bpm):  90 Weight (lbs): 211 Respiratory Rate (breaths/min): 18 Body Mass Index (BMI): 30.3 Blood Pressure (mmHg): 162/79 Reference Range: 80 - 120 mg / dl Electronic Signature(s) Signed: 09/22/2018 5:54:26 PM By: Gretta Cool, BSN, RN, CWS, Kim RN, BSN Entered By: Gretta Cool, BSN, RN, CWS, Kim on 09/16/2018 16:26:45

## 2018-11-24 ENCOUNTER — Ambulatory Visit: Payer: Medicare Other | Admitting: Family Medicine

## 2019-03-05 IMAGING — MR MR LUMBAR SPINE W/O CM
5 series · 32 of 48 positions shown · non-contrast
Comparison: None available.

CLINICAL DATA: Initial evaluation for acute L4 compression
fracture.

EXAM:
MRI LUMBAR SPINE WITHOUT CONTRAST
TECHNIQUE: Multiplanar, multisequence MR imaging of the lumbar spine was
performed. No intravenous contrast was administered.

[Series 2: T2 · sagittal · 4.0mm · 0.81mm/px · 6 of 17 slices shown (1 of 2)]
[im 1/17]
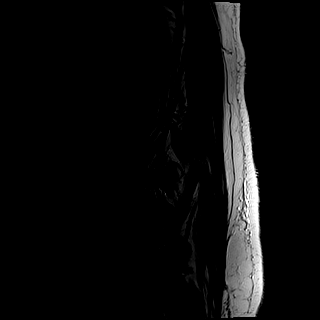
[im 4/17]
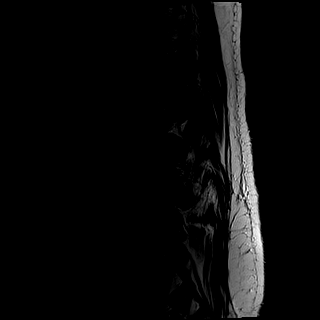
[im 7/17]
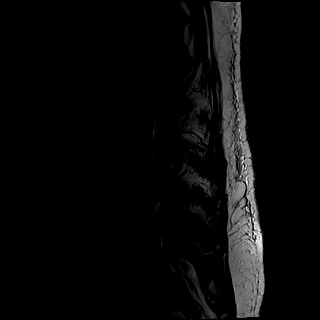
[im 10/17]
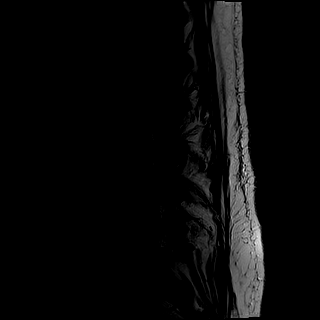
[im 13/17]
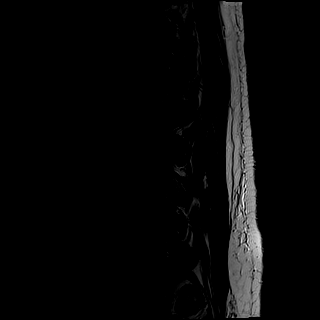
[im 17/17]
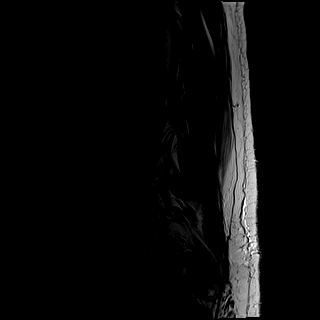

[Series 3: T1 · sagittal · 4.0mm · 0.81mm/px · 6 of 17 slices shown (1 of 2)]
[im 1/17]
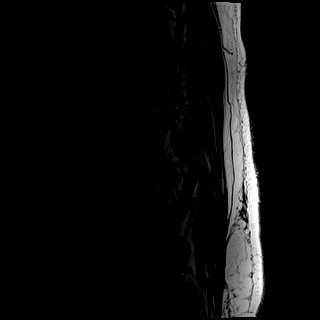
[im 4/17]
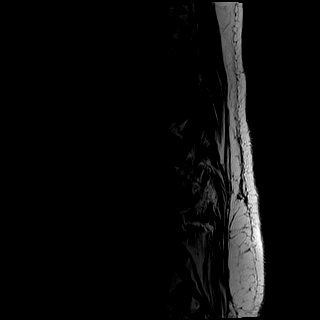
[im 7/17]
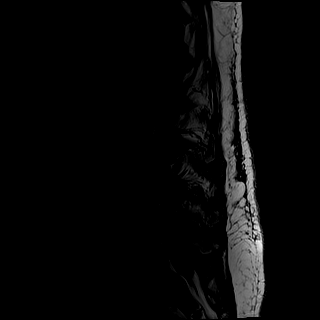
[im 10/17]
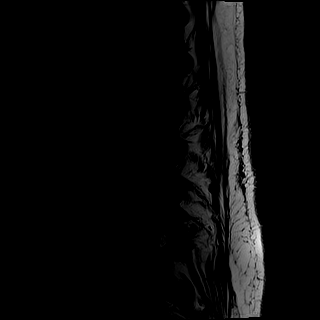
[im 13/17]
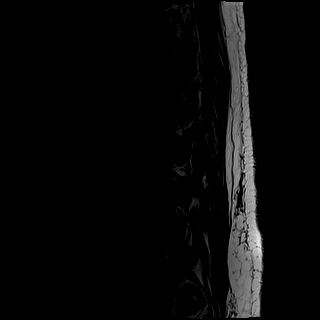
[im 17/17]
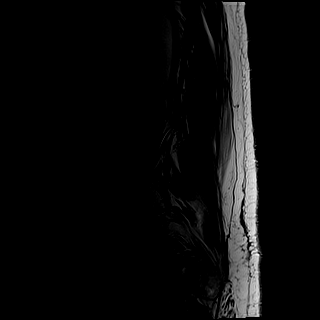

[Series 4: STIR · sagittal · 4.0mm · 1.02mm/px · 2 of 17 slices shown]
[im 1/17]
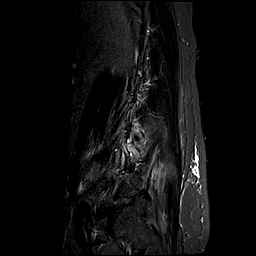
[im 4/17]
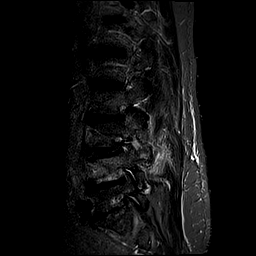

[Series 5: T2 · axial · 4.0mm · 0.78mm/px · z∈[-39,+171]mm · 9 of 41 slices shown (2 of 2)]
[im 1/41]
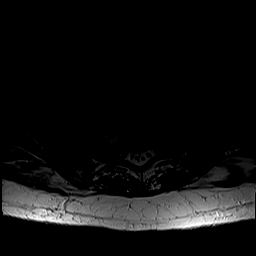
[im 6/41]
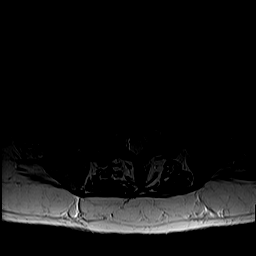
[im 12/41]
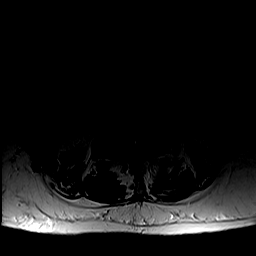
[im 18/41]
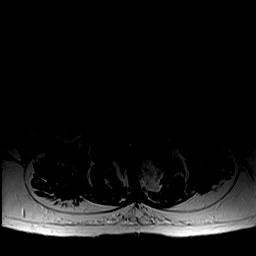
[im 21/41]
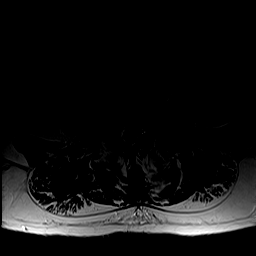
[im 23/41]
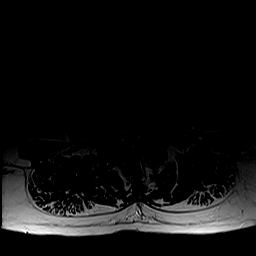
[im 29/41]
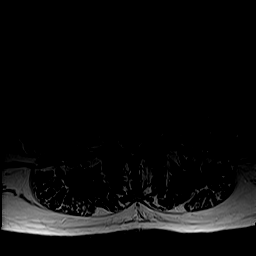
[im 35/41]
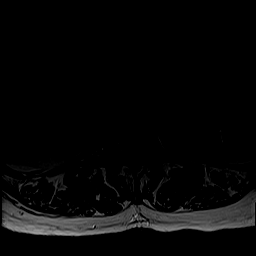
[im 41/41]
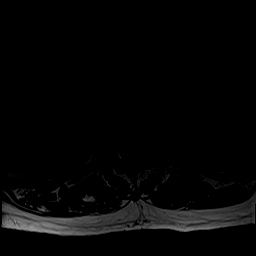

[Series 6: T1 · axial · 4.0mm · 0.39mm/px · z∈[-39,+171]mm · 9 of 41 slices shown (2 of 2)]
[im 1/41]
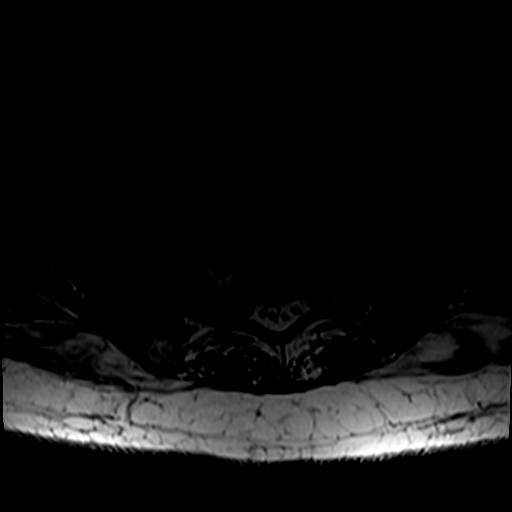
[im 6/41]
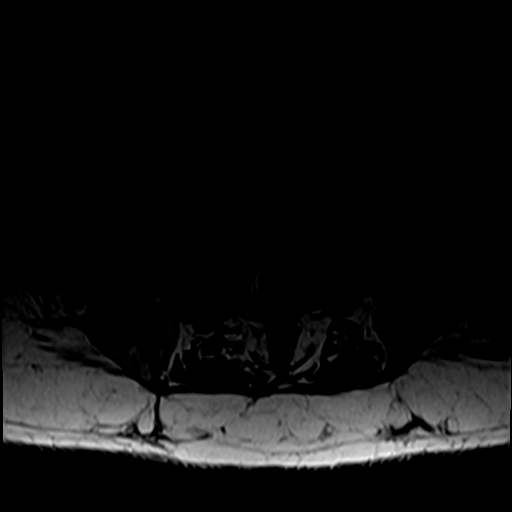
[im 12/41]
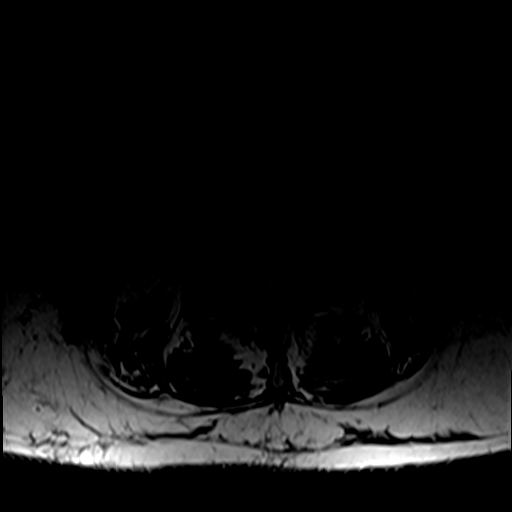
[im 18/41]
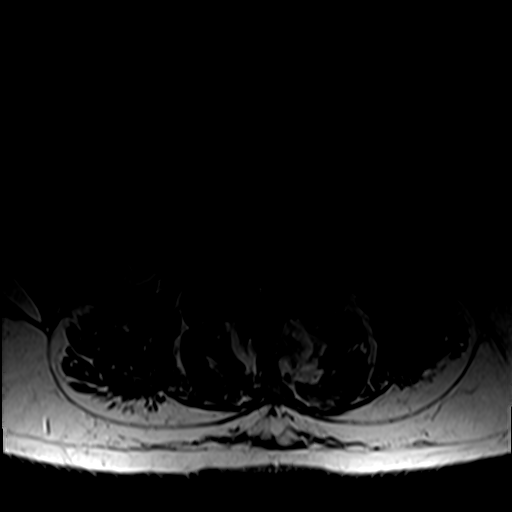
[im 21/41]
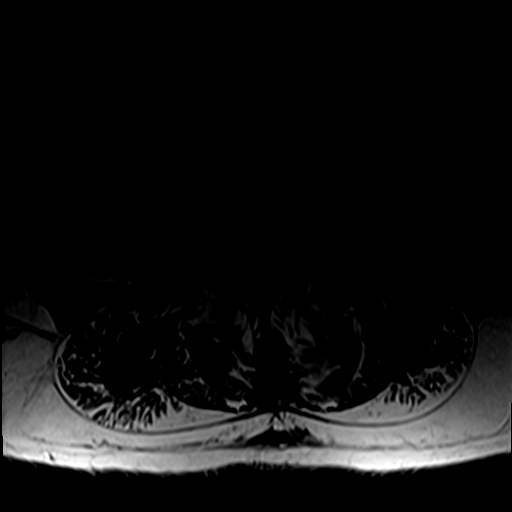
[im 23/41]
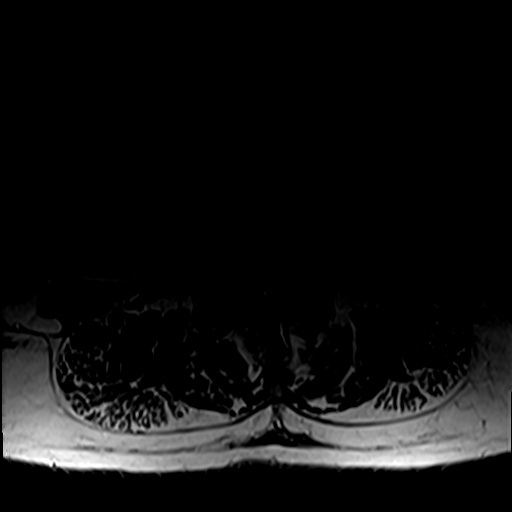
[im 29/41]
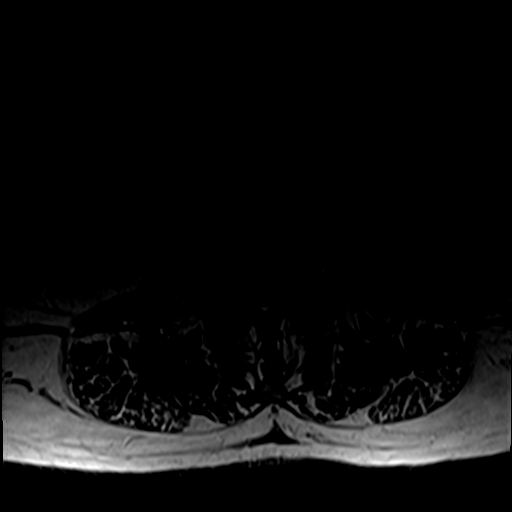
[im 35/41]
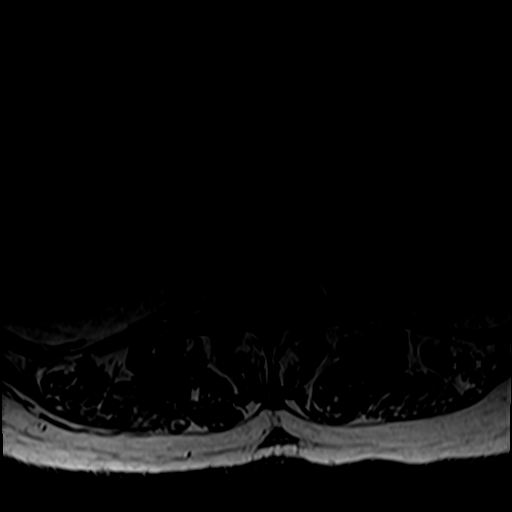
[im 41/41]
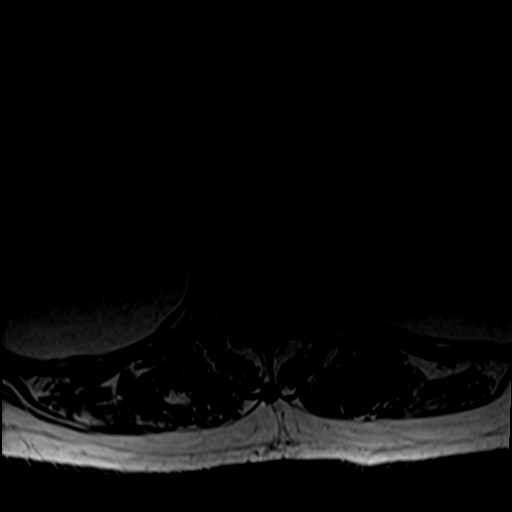

[32 of 48 positions shown; findings below may reference images not displayed]

FINDINGS: Segmentation: Normal segmentation. Lowest well-formed disc labeled
the L5-S1 level.

Alignment: Straightening of the normal lumbar lordosis. Trace
anterolisthesis of L3 on L4 andPY on S1.

Vertebrae: Abnormal T1 hypointense, T2/STIR hyperintense signal
intensity extends through the mild central height loss of
approximately 30% without bony retropulsion. This is benign in
appearance.

Vertebral body heights otherwise maintained. No other acute or
chronic fracture. Reactive endplate changes noted about the L2-3 and
L5-S1 interspaces. No discrete or worrisome osseous lesions. Signal
intensity within the vertebral body bone marrow normal. Mid and
inferior aspect of the L4 vertebral body, compatible with acute
compression fracture.

Conus medullaris: Extends to the T12-L1 level and appears normal.

Paraspinal and other soft tissues: Paraspinous edema about the L4
compression fracture. Paraspinous soft tissues otherwise normal.
Scattered T2 hyperintense cyst noted within the kidneys bilaterally.
Visualized visceral structures otherwise unremarkable.

Disc levels:

L1-2: Mild disc bulge with disc desiccation. Facet and ligamentum
flavum hypertrophy. No canal stenosis. Mild left L1 foraminal
narrowing.

L2-3: Diffuse degenerative disc bulge with disc desiccation and
intervertebral disc space narrowing. Reactive endplate changes.
Moderate facet and ligamentum flavum hypertrophy. Resultant moderate
to severe canal and bilateral subarticular stenosis. Thecal sac
measures 10 mm in AP diameter. Moderate bilateral L2 foraminal
stenosis.

L3-4: Trace anterolisthesis of L3 on L4. Diffuse circumferential
disc bulge with disc desiccation. Moderate bilateral facet arthrosis
with ligamentum flavum hypertrophy. Asymmetric perfusion noted
within the left L3-4 facet. Possible tiny 3 mm synovial cyst noted
at the medial aspect of the right L3-4 facet (series 5, image 23).
Resultant severe canal and bilateral subarticular stenosis. Thecal
sac measures the 7 mm in AP diameter. Severe bilateral L3 foraminal
stenosis.

L4-5: Diffuse disc bulge with intervertebral disc space narrowing.
Disc bulging slightly centric to the right without focal disc
protrusion. Moderate bilateral facet and ligamentum flavum
hypertrophy. Severe canal and bilateral subarticular stenosis.
Thecal sac measures approximately 7 mm in AP diameter. Mild right
with moderate left L4 foraminal stenosis.

L5-S1: Trace anterolisthesis of L5 on S1. Diffuse disc bulge with
intervertebral disc space narrowing and chronic reactive endplate
changes. Bulging disc closely approximates the descending S1 nerve
roots without frank neural impingement or significant stenosis. Mild
bilateral facet arthrosis. Mild to moderate bilateral L5 foraminal
narrowing.
IMPRESSION: 1. Acute compression fracture involving the L4 vertebral body with
mild 30% central height loss without bony retropulsion.
2. Multilevel degenerative spondylolysis and facet arthrosis at L2-3
through L4-5 with resultant moderate to severe canal and
subarticular stenosis as detailed above.
3. Multilevel degenerative changes with resultant multilevel
foraminal narrowing as above. Notable changes include moderate
bilateral L2 foraminal stenosis, severe bilateral L3 foraminal
narrowing, moderate left L4 foraminal stenosis, and mild to moderate
bilateral L5 foraminal narrowing.

## 2019-05-28 ENCOUNTER — Telehealth: Payer: Self-pay | Admitting: Family Medicine

## 2019-05-28 NOTE — Telephone Encounter (Signed)
I left a message asking the patient to call and schedule AWV-S with Tiffany and follow up visit with Dr. Raliegh Ip. VDM (DD)

## 2019-06-08 ENCOUNTER — Other Ambulatory Visit: Payer: Self-pay | Admitting: Family Medicine

## 2019-06-08 ENCOUNTER — Other Ambulatory Visit: Payer: Self-pay

## 2019-06-08 ENCOUNTER — Ambulatory Visit (INDEPENDENT_AMBULATORY_CARE_PROVIDER_SITE_OTHER): Payer: Medicare Other | Admitting: Family Medicine

## 2019-06-08 ENCOUNTER — Encounter: Payer: Self-pay | Admitting: Family Medicine

## 2019-06-08 VITALS — BP 124/48 | HR 83 | Temp 97.5°F | Resp 16 | Ht 70.0 in | Wt 206.4 lb

## 2019-06-08 DIAGNOSIS — H9192 Unspecified hearing loss, left ear: Secondary | ICD-10-CM

## 2019-06-08 DIAGNOSIS — J019 Acute sinusitis, unspecified: Secondary | ICD-10-CM

## 2019-06-08 DIAGNOSIS — R739 Hyperglycemia, unspecified: Secondary | ICD-10-CM

## 2019-06-08 DIAGNOSIS — J438 Other emphysema: Secondary | ICD-10-CM

## 2019-06-08 DIAGNOSIS — I10 Essential (primary) hypertension: Secondary | ICD-10-CM

## 2019-06-08 DIAGNOSIS — H6982 Other specified disorders of Eustachian tube, left ear: Secondary | ICD-10-CM | POA: Diagnosis not present

## 2019-06-08 DIAGNOSIS — R350 Frequency of micturition: Secondary | ICD-10-CM

## 2019-06-08 DIAGNOSIS — N401 Enlarged prostate with lower urinary tract symptoms: Secondary | ICD-10-CM

## 2019-06-08 DIAGNOSIS — E785 Hyperlipidemia, unspecified: Secondary | ICD-10-CM

## 2019-06-08 DIAGNOSIS — I5032 Chronic diastolic (congestive) heart failure: Secondary | ICD-10-CM

## 2019-06-08 MED ORDER — AMOXICILLIN-POT CLAVULANATE 875-125 MG PO TABS: 1.0000 | ORAL_TABLET | Freq: Two times a day (BID) | ORAL | 0 refills | Status: DC

## 2019-06-08 NOTE — Progress Notes (Signed)
Subjective:    Patient ID: Raymond Barnett, male    DOB: 1929/07/14, 86 y.o.   MRN: [id number]  Raymond Barnett is a 86 y.o. male presenting on 06/08/2019 for Ear Pain (pain in left ear 2-3 days. runny nose. Pt suspects it's from medication.)   HPI   Acute Sinusitis / Left Ear Fullness Eustachian Tube Dysfunction, Hearing loss Reports new problem onset Left ear clogged and full, without pain, but has reduced hearing in general. He has known tinnitus. - Using the Flonase nasal spray, seems to help dry up drainage. It helps him sleep at night. - He has some worsening sinus pain and pressure now, with deeper congestion and post nasal drainage. - Admits hoarse voice, allergic rhinitis  Health Maintenance: Updated - [place] vaccine 04/26/19 and 05/17/19.  Depression screen [facility] 2/9 06/08/2019 05/26/2018 05/26/2018  Decreased Interest 0 0 0  Down, Depressed, Hopeless 0 0 0  PHQ - 2 Score 0 0 0  Altered sleeping - - -  Tired, decreased energy - - -  Change in appetite - - -  Feeling bad or failure about yourself  - - -  Trouble concentrating - - -  Moving slowly or fidgety/restless - - -  Suicidal thoughts - - -  PHQ-9 Score - - -  Difficult doing work/chores - - -    Social History   Tobacco Use  . Smoking status: Former Smoker    Packs/day: 1.00    Years: 30.00    Pack years: 30.00    Types: Cigarettes    Quit date: 1995    Years since quitting: 26.1  . Smokeless tobacco: Former Network engineer Use Topics  . Alcohol use: Not Currently    Comment: occasionally wine   . Drug use: No    Review of Systems Per HPI unless specifically indicated above     Objective:    BP (!) 124/48   Pulse 83   Temp (!) 97.5 F (36.4 C)   Resp 16   Ht 5\' 10"  (1.778 m)   Wt 206 lb 6.4 oz (93.6 kg)   SpO2 98%   BMI 29.62 kg/m   Wt Readings from Last 3 Encounters:  06/08/19 206 lb 6.4 oz (93.6 kg)  05/26/18 209 lb 2.6 oz (94.9 kg)  05/26/18 209 lb 9.6 oz (95.1 kg)    Physical  Exam Vitals and nursing note reviewed.  Constitutional:      General: He is not in acute distress.    Appearance: He is well-developed. He is not diaphoretic.     Comments: Well-appearing, comfortable, cooperative  HENT:     Head: Normocephalic and atraumatic.     Right Ear: Tympanic membrane, ear canal and external ear normal. There is no impacted cerumen.     Left Ear: Ear canal and external ear normal. There is no impacted cerumen.     Ears:     Comments: Left TM opaque with effusion no bulging    Nose: No congestion.  Eyes:     General:        Right eye: No discharge.        Left eye: No discharge.     Conjunctiva/sclera: Conjunctivae normal.  Cardiovascular:     Rate and Rhythm: Normal rate.  Pulmonary:     Effort: Pulmonary effort is normal.  Skin:    General: Skin is warm and dry.     Findings: No erythema or rash.  Neurological:  Mental Status: He is alert and oriented to person, place, and time.  Psychiatric:        Behavior: Behavior normal.     Comments: Well groomed, good eye contact, normal speech and thoughts        Results for orders placed or performed in visit on 05/18/18  PSA  Result Value Ref Range   PSA 0.3 < OR = 4.0 ng/mL  Lipid panel  Result Value Ref Range   Cholesterol 151 <200 mg/dL   HDL 44 >40 mg/dL   Triglycerides 81 <150 mg/dL   LDL Cholesterol (Calc) 90 mg/dL (calc)   Total CHOL/HDL Ratio 3.4 <5.0 (calc)   Non-HDL Cholesterol (Calc) 107 <130 mg/dL (calc)  COMPLETE METABOLIC PANEL WITH GFR  Result Value Ref Range   Glucose, Bld 106 (H) 65 - 99 mg/dL   BUN 15 7 - 25 mg/dL   Creat 0.83 0.70 - 1.11 mg/dL   GFR, Est Non African American 80 > OR = 60 mL/min/1.97m2   GFR, Est African American 92 > OR = 60 mL/min/1.47m2   BUN/Creatinine Ratio NOT APPLICABLE 6 - 22 (calc)   Sodium 139 135 - 146 mmol/L   Potassium 4.4 3.5 - 5.3 mmol/L   Chloride 105 98 - 110 mmol/L   CO2 26 20 - 32 mmol/L   Calcium 9.4 8.6 - 10.3 mg/dL   Total  Protein 6.3 6.1 - 8.1 g/dL   Albumin 3.8 3.6 - 5.1 g/dL   Globulin 2.5 1.9 - 3.7 g/dL (calc)   AG Ratio 1.5 1.0 - 2.5 (calc)   Total Bilirubin 1.0 0.2 - 1.2 mg/dL   Alkaline phosphatase (APISO) 49 40 - 115 U/L   AST 20 10 - 35 U/L   ALT 15 9 - 46 U/L  CBC with Differential/Platelet  Result Value Ref Range   WBC 5.5 3.8 - 10.8 Thousand/uL   RBC 3.75 (L) 4.20 - 5.80 Million/uL   Hemoglobin 12.2 (L) 13.2 - 17.1 g/dL   HCT 36.5 (L) 38.5 - 50.0 %   MCV 97.3 80.0 - 100.0 fL   MCH 32.5 27.0 - 33.0 pg   MCHC 33.4 32.0 - 36.0 g/dL   RDW 16.2 (H) 11.0 - 15.0 %   Platelets 143 140 - 400 Thousand/uL   MPV 12.4 7.5 - 12.5 fL   Neutro Abs 3,597 1,500 - 7,800 cells/uL   Lymphs Abs 1,326 850 - 3,900 cells/uL   Absolute Monocytes 418 200 - 950 cells/uL   Eosinophils Absolute 138 15 - 500 cells/uL   Basophils Absolute 22 0 - 200 cells/uL   Neutrophils Relative % 65.4 %   Total Lymphocyte 24.1 %   Monocytes Relative 7.6 %   Eosinophils Relative 2.5 %   Basophils Relative 0.4 %  Hemoglobin A1c  Result Value Ref Range   Hgb A1c MFr Bld 5.0 <5.7 % of total Hgb   Mean Plasma Glucose 97 (calc)   eAG (mmol/L) 5.4 (calc)      Assessment & Plan:   Problem List Items Addressed This Visit    None    Visit Diagnoses    Acute rhinosinusitis    -  Primary   Relevant Medications   amoxicillin-clavulanate (AUGMENTIN) 875-125 MG tablet   Hearing loss of left ear, unspecified hearing loss type       Dysfunction of left eustachian tube          Consistent with acute frontal rhinosinusitis and Left ear effusion eustachian tube dysfunction Likely underlying allergic  rhinitis component with possible worsening concern for bacterial infection.   Plan: 1 Start Augmentin 875-125mg  PO BID x 7 days 2. Re start Flonase existing rx 3. May switch antihistamine if needed, otherwise was ineffective 4. Return criteria reviewed   Meds ordered this encounter  Medications  . amoxicillin-clavulanate  (AUGMENTIN) 875-125 MG tablet    Sig: Take 1 tablet by mouth 2 (two) times daily. For 7 days    Dispense:  14 tablet    Refill:  0     Follow up plan: Return in about 4 weeks (around 07/06/2019), or if symptoms worsen or fail to improve, for [employer].  Future labs ordered for 06/29/19  Nobie Putnam, [facility] 06/08/2019, 3:09 PM

## 2019-06-08 NOTE — Patient Instructions (Addendum)
Thank you for coming to the office today.  Likely with fluid behind ear drum. No infection or wax  Start Augmentin twice a day for 7 days  Can switch allergy med to Zyrtec or Allegra if you prefer.  Start nasal steroid Flonase 2 sprays in each nostril daily for 4-6 weeks, may repeat course seasonally or as needed  Atrovent nasal spray is only for short term use congestion but will not resolve the ear fluid  DUE for FASTING BLOOD WORK (no food or drink after midnight before the lab appointment, only water or coffee without cream/sugar on the morning of)  SCHEDULE "Lab Only" visit in the morning at the clinic for lab draw in 4 WEEKS   - Make sure Lab Only appointment is at about 1 week before your next appointment, so that results will be available  For Lab Results, once available within 2-3 days of blood draw, you can can log in to MyChart online to view your results and a brief explanation. Also, we can discuss results at next follow-up visit.   Please schedule a Follow-up Appointment to: Return in about 4 weeks (around 07/06/2019), or if symptoms worsen or fail to improve, for [facility].  If you have any other questions or concerns, please feel free to call the office or send a message through [place]. You may also schedule an earlier appointment if necessary.  Additionally, you may be receiving a survey about your experience at our office within a few days to 1 week by e-mail or mail. We value your feedback.  Nobie Putnam, DO [place]

## 2019-06-21 DIAGNOSIS — H6122 Impacted cerumen, left ear: Secondary | ICD-10-CM | POA: Diagnosis not present

## 2019-06-21 DIAGNOSIS — H903 Sensorineural hearing loss, bilateral: Secondary | ICD-10-CM | POA: Diagnosis not present

## 2019-06-29 ENCOUNTER — Other Ambulatory Visit: Payer: Self-pay

## 2019-06-29 ENCOUNTER — Other Ambulatory Visit: Payer: Medicare Other

## 2019-06-29 DIAGNOSIS — R739 Hyperglycemia, unspecified: Secondary | ICD-10-CM

## 2019-06-29 DIAGNOSIS — R350 Frequency of micturition: Secondary | ICD-10-CM | POA: Diagnosis not present

## 2019-06-29 DIAGNOSIS — E785 Hyperlipidemia, unspecified: Secondary | ICD-10-CM

## 2019-06-29 DIAGNOSIS — I5032 Chronic diastolic (congestive) heart failure: Secondary | ICD-10-CM | POA: Diagnosis not present

## 2019-06-29 DIAGNOSIS — I10 Essential (primary) hypertension: Secondary | ICD-10-CM | POA: Diagnosis not present

## 2019-06-29 DIAGNOSIS — N401 Enlarged prostate with lower urinary tract symptoms: Secondary | ICD-10-CM

## 2019-06-30 LAB — CBC WITH DIFFERENTIAL/PLATELET
Absolute Monocytes: 512 cells/uL (ref 200–950)
Basophils Absolute: 38 cells/uL (ref 0–200)
Basophils Relative: 0.6 %
Eosinophils Absolute: 198 cells/uL (ref 15–500)
Eosinophils Relative: 3.1 %
HCT: 38.7 % (ref 38.5–50.0)
Hemoglobin: 12.5 g/dL — ABNORMAL LOW (ref 13.2–17.1)
Lymphs Abs: 1312 cells/uL (ref 850–3900)
MCH: 32.4 pg (ref 27.0–33.0)
MCHC: 32.3 g/dL (ref 32.0–36.0)
MCV: 100.3 fL — ABNORMAL HIGH (ref 80.0–100.0)
MPV: 12.7 fL — ABNORMAL HIGH (ref 7.5–12.5)
Monocytes Relative: 8 %
Neutro Abs: 4339 cells/uL (ref 1500–7800)
Neutrophils Relative %: 67.8 %
Platelets: 177 10*3/uL (ref 140–400)
RBC: 3.86 10*6/uL — ABNORMAL LOW (ref 4.20–5.80)
RDW: 17.9 % — ABNORMAL HIGH (ref 11.0–15.0)
Total Lymphocyte: 20.5 %
WBC: 6.4 10*3/uL (ref 3.8–10.8)

## 2019-06-30 LAB — COMPLETE METABOLIC PANEL WITH GFR
AG Ratio: 1.4 (calc) (ref 1.0–2.5)
ALT: 14 U/L (ref 9–46)
AST: 21 U/L (ref 10–35)
Albumin: 3.7 g/dL (ref 3.6–5.1)
Alkaline phosphatase (APISO): 45 U/L (ref 35–144)
BUN: 16 mg/dL (ref 7–25)
CO2: 28 mmol/L (ref 20–32)
Calcium: 9.2 mg/dL (ref 8.6–10.3)
Chloride: 105 mmol/L (ref 98–110)
Creat: 0.83 mg/dL (ref 0.70–1.11)
GFR, Est African American: 92 mL/min/{1.73_m2} (ref >=60)
GFR, Est Non African American: 79 mL/min/{1.73_m2} (ref >=60)
Globulin: 2.6 g/dL (calc) (ref 1.9–3.7)
Glucose, Bld: 99 mg/dL (ref 65–99)
Potassium: 4.2 mmol/L (ref 3.5–5.3)
Sodium: 138 mmol/L (ref 135–146)
Total Bilirubin: 1.1 mg/dL (ref 0.2–1.2)
Total Protein: 6.3 g/dL (ref 6.1–8.1)

## 2019-06-30 LAB — LIPID PANEL
Cholesterol: 136 mg/dL (ref <200)
HDL: 41 mg/dL (ref >=40)
LDL Cholesterol (Calc): 79 mg/dL (calc)
Non-HDL Cholesterol (Calc): 95 mg/dL (calc) (ref <130)
Total CHOL/HDL Ratio: 3.3 (calc) (ref <5.0)
Triglycerides: 82 mg/dL (ref <150)

## 2019-06-30 LAB — PSA: PSA: 0.3 ng/mL (ref <=4.0)

## 2019-06-30 LAB — HEMOGLOBIN A1C
Hgb A1c MFr Bld: 5 % of total Hgb (ref <5.7)
Mean Plasma Glucose: 97 (calc)
eAG (mmol/L): 5.4 (calc)

## 2019-07-06 ENCOUNTER — Encounter: Payer: Self-pay | Admitting: Family Medicine

## 2019-07-06 ENCOUNTER — Ambulatory Visit (INDEPENDENT_AMBULATORY_CARE_PROVIDER_SITE_OTHER): Payer: Medicare Other | Admitting: Family Medicine

## 2019-07-06 ENCOUNTER — Other Ambulatory Visit: Payer: Self-pay

## 2019-07-06 VITALS — BP 134/68 | HR 77 | Temp 96.9°F | Resp 16 | Ht 70.0 in | Wt 206.0 lb

## 2019-07-06 DIAGNOSIS — J438 Other emphysema: Secondary | ICD-10-CM

## 2019-07-06 DIAGNOSIS — D696 Thrombocytopenia, unspecified: Secondary | ICD-10-CM | POA: Diagnosis not present

## 2019-07-06 DIAGNOSIS — I5032 Chronic diastolic (congestive) heart failure: Secondary | ICD-10-CM | POA: Diagnosis not present

## 2019-07-06 DIAGNOSIS — I700 Atherosclerosis of aorta: Secondary | ICD-10-CM | POA: Diagnosis not present

## 2019-07-06 DIAGNOSIS — I739 Peripheral vascular disease, unspecified: Secondary | ICD-10-CM | POA: Diagnosis not present

## 2019-07-06 DIAGNOSIS — I10 Essential (primary) hypertension: Secondary | ICD-10-CM | POA: Diagnosis not present

## 2019-07-06 NOTE — Assessment & Plan Note (Addendum)
Clinically stable, euvolemic, with preserved EF on last ECHO in 2019 Cardiomyopathy, with PVD, PAD Followed by [facility] Cardiology Dr End  He has declined angiography and catheterization. Also has self discontinued Rosuvastatin, Metoprolol.  Discussed recommendations for continuing these medicines are to treat these problems and prevent future issue, but he declines this intervention. Advised him to follow-up discussion with Dr End.  Cannot rule out that some dyspnea isn't from CHF, but history may suggest that it is more Emphysema for him currently. Will pursue Symbicort

## 2019-07-06 NOTE — Assessment & Plan Note (Addendum)
Controlled HTN - Home BP readings normal, reviewed today  No known complications  Off Metoprolol  Plan:  1. Continue current BP regimen - Lisinopril 5mg  daily 2. Encourage improved lifestyle - low sodium diet, regular exercise 3. Continue monitor BP outside office, infrequent readings now, if persistently >140/90 or new symptoms notify office sooner

## 2019-07-06 NOTE — Progress Notes (Signed)
Subjective:    Patient ID: Raymond Barnett, male    DOB: 1929/09/03, 86 y.o.   MRN: [id number]  Raymond Barnett is a 86 y.o. male presenting on 07/06/2019 for COPD   HPI   Here for Yearly Medicare Check up  HYPERLIPIDEMIA: - Reports he stopped taking Rosuvastatin, didn't think he needed it Last lipid panel 06/2019, controlled   FOLLOW-UPParaseptal Emphysema / Chronic Dyspnea Previous work up, with chronic dyspnea on exertion in past, he had seen Cardiology and had extensive work up and also management for lungs and pulmonary. He had 04/2017 CT lungs, diagnosed w/ Paraseptal emphysema and concern pulm fibrosis. - [facility] Symbicort but he doesn't use it. Asking if it would help. He has not seem Pulmonology - He is former smoker Denies any dyspnea, chest pain or pressure, productive cough, wheezing  Allergic Rhinitis Chronic problem On Flonase, using intermittently, not currently  Chronic Low Back Pain / Lumbar DDD Known history lumbar DDD facet arthritis, prior compression fracture, spinal stenosis. Has been followed in past by [employer] and Pain/Anesthesia with injection therapy, med management, he has had limited benefits in past, has been lost to follow-up by them since >2018.  Peripheral Arterial Disease PAD / Heart Failure with preserved ejection fracture NYHA III Last visit with Cardiology [facility] Dr End 03/2018, has had diagnosed with moderate disease in RLE on ABI, and he has had chronic poor healing wound. He has declined angiography. Also regarding CHF / cardiomyopathy he was advised could proceed with cardiac catheterization but this was declined as well, due to EKG and ECHO findings, he was started on anti-anginal med metoprolol low dose 12.51m daily and continued on Aspirin and Statin low dose. - Today patient reports he does not think these medicines are warranted, he does not want to take any additional medicines - Remains off Rosuvastatin,  Metoprolol Denies chest pain, dyspnea, edema, claudication  Thrombocytopenia Resolved on last lab.  BPH Chronic problem urinary frequency LUTS. Taking Finasteride 522mdaily. He has tried taking it every other day for 1 week and didn't notice any change. He asks if he still needs this med.  Follow-up Hearing Loss / Sinus Issues Last visit 06/08/19, he was given Augmentin antibiotic and allergy [facility]    [facility] 07/06/2019 06/08/2019 05/26/2018  Decreased Interest 0 0 0  Down, Depressed, Hopeless 0 0 0  PHQ - 2 Score 0 0 0  Altered sleeping - - -  Tired, decreased energy - - -  Change in appetite - - -  Feeling bad or failure about yourself  - - -  Trouble concentrating - - -  Moving slowly or fidgety/restless - - -  Suicidal thoughts - - -  PHQ-9 Score - - -  Difficult doing work/chores - - -    Past Medical History:  Diagnosis Date  . Arthritis   . Benign prostate hyperplasia    lower urinary tract symptoms  . Cataracts, bilateral   . DDD (degenerative disc disease), lumbar   . Glaucoma   . H/O carpal tunnel syndrome   . History of osteomyelitis 1953   right leg  . Hypertension   . Thrombocytopenia (H[place]   Past Surgical History:  Procedure Laterality Date  . CARPAL TUNNEL RELEASE Left   . CARPAL TUNNEL RELEASE Right   . COLONOSCOPY  2015   Dr. [name]. LEG SURGERY Right 1953  .  SKIN GRAFT Right 05/01/2017   right foot at [facility]  . TOTAL KNEE ARTHROPLASTY Right    Social History   Socioeconomic History  . Marital status: Married    Spouse name: Not on file  . Number of children: Not on file  . Years of education: Not on file  . Highest education level: High school graduate  Occupational History  . Not on file  Tobacco Use  . Smoking status: Former Smoker    Packs/day: 1.00    Years: 30.00    Pack years: 30.00    Types: Cigarettes    Quit date: 1995    Years since quitting: 26.2  .  Smokeless tobacco: Former Network engineer and Sexual Activity  . Alcohol use: Not Currently    Comment: occasionally wine   . Drug use: No  . Sexual activity: Not Currently  Other Topics Concern  . Not on file  Social History Narrative  . Not on file   Social Determinants of Health   Financial Resource Strain:   . Difficulty of Paying Living Expenses:   Food Insecurity:   . Worried About Charity fundraiser in the Last Year:   . Arboriculturist in the Last Year:   Transportation Needs:   . Film/video editor (Medical):   Marland Kitchen Lack of Transportation (Non-Medical):   Physical Activity:   . Days of Exercise per Week:   . Minutes of Exercise per Session:   Stress:   . Feeling of Stress :   Social Connections:   . Frequency of Communication with Friends and Family:   . Frequency of Social Gatherings with Friends and Family:   . Attends Religious Services:   . Active Member of Clubs or Organizations:   . Attends Archivist Meetings:   Marland Kitchen Marital Status:   Intimate Partner Violence:   . Fear of Current or Ex-Partner:   . Emotionally Abused:   Marland Kitchen Physically Abused:   . Sexually Abused:    Family History  Problem Relation Age of Onset  . Colon cancer Mother 19  . Thrombosis Father 2  . Heart disease Father        Heart valve problem  . AAA (abdominal aortic aneurysm) Father   . Hypertension Sister   . Hyperlipidemia Sister   . Hypertension Sister   . Hyperlipidemia Sister   . Hypertension Sister   . Hyperlipidemia Sister    Current Outpatient Medications on File Prior to Visit  Medication Sig  . aspirin 81 MG tablet Take 1 tablet by mouth daily.  . budesonide-formoterol (SYMBICORT) 160-4.5 MCG/ACT inhaler Inhale 2 puffs into the lungs 2 (two) times daily.  . Calcium Carb-Cholecalciferol (CALCIUM 600 + D) 600-200 MG-UNIT TABS Take 1 tablet by mouth daily. Patient takes 1000 mg daily.  . Cholecalciferol (VITAMIN D3) 1000 units CAPS   . finasteride (PROSCAR) 5  MG tablet Take 5 mg by mouth daily.  . fluticasone (FLONASE) 50 MCG/ACT nasal spray Place 2 sprays into both nostrils daily. Use for 4-6 weeks then stop and use seasonally or as needed.  . Lidocaine-Hydrocortisone Ace 2-2 % KIT Use rectal suppository twice daily as needed for up to 1 week for hemorrhoids  . lisinopril (PRINIVIL,ZESTRIL) 5 MG tablet Take 1 tablet (5 mg total) by mouth daily.  . meloxicam (MOBIC) 15 MG tablet Take 1 tablet (15 mg total) by mouth daily. Take with food for 2-4 weeks then as needed  . timolol (TIMOPTIC) 0.5 %  ophthalmic solution 1 drop 2 (two) times daily.  Marland Kitchen ULTRA FRESH 0.5 % SOLN    No current facility-administered medications on file prior to visit.    Review of Systems  Constitutional: Negative for activity change, appetite change, chills, diaphoresis, fatigue and fever.  HENT: Negative for congestion and hearing loss.   Eyes: Negative for visual disturbance.  Respiratory: Positive for shortness of breath. Negative for apnea, cough, chest tightness and wheezing.   Cardiovascular: Negative for chest pain, palpitations and leg swelling.  Gastrointestinal: Negative for abdominal pain, anal bleeding, blood in stool, constipation, diarrhea, nausea and vomiting.  Endocrine: Negative for cold intolerance.  Genitourinary: Positive for frequency. Negative for decreased urine volume, difficulty urinating, dysuria, hematuria and urgency.       Nocturia  Musculoskeletal: Positive for back pain. Negative for arthralgias and neck pain.  Skin: Negative for rash.  Allergic/Immunologic: Negative for environmental allergies.  Neurological: Negative for dizziness, weakness, light-headedness, numbness and headaches.  Hematological: Negative for adenopathy.  Psychiatric/Behavioral: Negative for behavioral problems, dysphoric mood and sleep disturbance. The patient is not nervous/anxious.    Per HPI unless specifically indicated above     Objective:    BP 134/68 (BP  Location: Left Arm, Cuff Size: Normal)   Pulse 77   Temp (!) 96.9 F (36.1 C) (Temporal)   Resp 16   Ht _0  (1.778 m)   Wt 206 lb (93.4 kg)   SpO2 99%   BMI 29.56 kg/m   Wt Readings from Last 3 Encounters:  07/06/19 206 lb (93.4 kg)  06/08/19 206 lb 6.4 oz (93.6 kg)  05/26/18 209 lb 2.6 oz (94.9 kg)    Physical Exam Vitals and nursing note reviewed.  Constitutional:      General: He is not in acute distress.    Appearance: He is well-developed. He is not diaphoretic.     Comments: Well-appearing, comfortable, cooperative  HENT:     Head: Normocephalic and atraumatic.  Eyes:     General:        Right eye: No discharge.        Left eye: No discharge.     Conjunctiva/sclera: Conjunctivae normal.     Pupils: Pupils are equal, round, and reactive to light.  Neck:     Thyroid: No thyromegaly.  Cardiovascular:     Rate and Rhythm: Normal rate and regular rhythm.     Heart sounds: Normal heart sounds. No murmur.  Pulmonary:     Effort: Pulmonary effort is normal. No respiratory distress.     Breath sounds: Normal breath sounds. No wheezing or rales.  Abdominal:     General: Bowel sounds are normal. There is no distension.     Palpations: Abdomen is soft. There is no mass.     Tenderness: There is no abdominal tenderness.  Musculoskeletal:        General: No tenderness. Normal range of motion.     Cervical back: Normal range of motion and neck supple.     Comments: Upper / Lower Extremities: - Normal muscle tone, strength bilateral upper extremities 5/5, lower extremities 5/5  Lymphadenopathy:     Cervical: No cervical adenopathy.  Skin:    General: Skin is warm and dry.     Findings: No erythema or rash.  Neurological:     Mental Status: He is alert and oriented to person, place, and time.     Comments: Distal sensation intact to light touch all extremities  Psychiatric:  Behavior: Behavior normal.     Comments: Well groomed, good eye contact, normal speech  and thoughts        Results for orders placed or performed in visit on 06/29/19  PSA  Result Value Ref Range   PSA 0.3 < OR = 4.0 ng/mL  Lipid panel  Result Value Ref Range   Cholesterol 136 <200 mg/dL   HDL 41 > OR = 40 mg/dL   Triglycerides 82 <150 mg/dL   LDL Cholesterol (Calc) 79 mg/dL (calc)   Total CHOL/HDL Ratio 3.3 <5.0 (calc)   Non-HDL Cholesterol (Calc) 95 <130 mg/dL (calc)  COMPLETE METABOLIC PANEL WITH GFR  Result Value Ref Range   Glucose, Bld 99 65 - 99 mg/dL   BUN 16 7 - 25 mg/dL   Creat 0.83 0.70 - 1.11 mg/dL   GFR, Est Non African American 79 > OR = 60 mL/min/1.63m   GFR, Est African American 92 > OR = 60 mL/min/1.748m  BUN/Creatinine Ratio NOT APPLICABLE 6 - 22 (calc)   Sodium 138 135 - 146 mmol/L   Potassium 4.2 3.5 - 5.3 mmol/L   Chloride 105 98 - 110 mmol/L   CO2 28 20 - 32 mmol/L   Calcium 9.2 8.6 - 10.3 mg/dL   Total Protein 6.3 6.1 - 8.1 g/dL   Albumin 3.7 3.6 - 5.1 g/dL   Globulin 2.6 1.9 - 3.7 g/dL (calc)   AG Ratio 1.4 1.0 - 2.5 (calc)   Total Bilirubin 1.1 0.2 - 1.2 mg/dL   Alkaline phosphatase (APISO) 45 35 - 144 U/L   AST 21 10 - 35 U/L   ALT 14 9 - 46 U/L  CBC with Differential/Platelet  Result Value Ref Range   WBC 6.4 3.8 - 10.8 Thousand/uL   RBC 3.86 (L) 4.20 - 5.80 Million/uL   Hemoglobin 12.5 (L) 13.2 - 17.1 g/dL   HCT 38.7 38.5 - 50.0 %   MCV 100.3 (H) 80.0 - 100.0 fL   MCH 32.4 27.0 - 33.0 pg   MCHC 32.3 32.0 - 36.0 g/dL   RDW 17.9 (H) 11.0 - 15.0 %   Platelets 177 140 - 400 Thousand/uL   MPV 12.7 (H) 7.5 - 12.5 fL   Neutro Abs 4,339 1,500 - 7,800 cells/uL   Lymphs Abs 1,312 850 - 3,900 cells/uL   Absolute Monocytes 512 200 - 950 cells/uL   Eosinophils Absolute 198 15 - 500 cells/uL   Basophils Absolute 38 0 - 200 cells/uL   Neutrophils Relative % 67.8 %   Total Lymphocyte 20.5 %   Monocytes Relative 8.0 %   Eosinophils Relative 3.1 %   Basophils Relative 0.6 %  Hemoglobin A1c  Result Value Ref Range   Hgb A1c MFr  Bld 5.0 <5.7 % of total Hgb   Mean Plasma Glucose 97 (calc)   eAG (mmol/L) 5.4 (calc)      Assessment & Plan:   Problem List Items Addressed This Visit    RESOLVED: Thrombocytopenia ([facility]  Peripheral arterial disease (HCC)    PAD on prior ABI [facility]  He has declined angiography and catheterization. Also has self discontinued Rosuvastatin, Metoprolol.  Discussed recommendations for continuing these medicines are to treat these problems and prevent future issue, but he declines this intervention. Advised him to follow-up discussion with Dr End.      Paraseptal emphysema ([facility]    Without flare but seems to have CHRONIC declined breathing with dyspnea on exertion Inadequate  trial on maintenance therapy - stopped Symbicort did not try long enough for results  Prior smoker On imaging CT 2019  RESTART Symbicort 2 puffs twice a day from [place] - we can adjust med or switch if needed, try for 1 month and likely continue longer  F/u within 3-4 months on progress, can refer to Pulm if indicated      Relevant Medications   budesonide-formoterol (SYMBICORT) 160-4.5 MCG/ACT inhaler   Essential hypertension    Controlled HTN - Home BP readings normal, reviewed today  No known complications  Off Metoprolol  Plan:  1. Continue current BP regimen - Lisinopril 74m daily 2. Encourage improved lifestyle - low sodium diet, regular exercise 3. Continue monitor BP outside office, infrequent readings now, if persistently >140/90 or new symptoms notify office sooner      Chronic heart failure with preserved ejection fraction ([place]    Clinically stable, euvolemic, with preserved EF on last ECHO in 2019 Cardiomyopathy, with PVD, PAD [facility]  He has declined angiography and catheterization. Also has self discontinued Rosuvastatin, Metoprolol.  Discussed recommendations for continuing these medicines are to treat these problems and  prevent future issue, but he declines this intervention. Advised him to follow-up discussion with Dr End.  Cannot rule out that some dyspnea isn't from CHF, but history may suggest that it is more Emphysema for him currently. Will pursue Symbicort      Aortic atherosclerosis (HM[place]    On prior imaging. Declines statin On ASA         No orders of the defined types were placed in this encounter.   Updated Health Maintenance information Reviewed recent lab results with patient Encouraged improvement to lifestyle with diet and exercise Maintain healthy weight  Follow up plan: Return in about 4 months (around 11/05/2019) for 4 months follow-up Emphysema short of breath.  ANobie Putnam [facility] 07/06/2019, 2:09 PM

## 2019-07-06 NOTE — Assessment & Plan Note (Signed)
PAD on prior ABI Followed by [facility] Cardiology Dr End  He has declined angiography and catheterization. Also has self discontinued Rosuvastatin, Metoprolol.  Discussed recommendations for continuing these medicines are to treat these problems and prevent future issue, but he declines this intervention. Advised him to follow-up discussion with Dr End.

## 2019-07-06 NOTE — Assessment & Plan Note (Signed)
Without flare but seems to have CHRONIC declined breathing with dyspnea on exertion Inadequate trial on maintenance therapy - stopped Symbicort did not try long enough for results  Prior smoker On imaging CT 2019  RESTART Symbicort 2 puffs twice a day from [place] - we can adjust med or switch if needed, try for 1 month and likely continue longer  F/u within 3-4 months on progress, can refer to Pulm if indicated

## 2019-07-06 NOTE — Assessment & Plan Note (Signed)
On prior imaging. Declines statin On ASA

## 2019-07-06 NOTE — Patient Instructions (Addendum)
Thank you for coming to the office today.  Lab results look great.  For shortness of breath / difficulty with oxygen - (number is at 99% which is good) but may feel like you are not getting enough - LIkely due to Emphysema or scar tissue in lungs - START Symbicort 2 puffs twice a day, every day - We can consider switching or adding to this treatment if not making progress - we can also call up a Lung Specialist if need  For prostate BPH - Can continue Finasteride, otherwise in future if want to scale back or go to every other day that is fine.  Please schedule a Follow-up Appointment to: Return in about 4 months (around 11/05/2019) for 4 months follow-up Emphysema short of breath.  If you have any other questions or concerns, please feel free to call the office or send a message through [facility]. You may also schedule an earlier appointment if necessary.  Additionally, you may be receiving a survey about your experience at our office within a few days to 1 week by e-mail or mail. We value your feedback.  Nobie Putnam, DO [place]

## 2019-09-23 ENCOUNTER — Ambulatory Visit
Admission: RE | Admit: 2019-09-23 | Discharge: 2019-09-23 | Disposition: A | Discharge status: Home / Self Care | Payer: Medicare Other | Source: Ambulatory Visit | Attending: Internal Medicine | Admitting: Internal Medicine

## 2019-09-23 ENCOUNTER — Other Ambulatory Visit: Payer: Self-pay

## 2019-09-23 ENCOUNTER — Ambulatory Visit (INDEPENDENT_AMBULATORY_CARE_PROVIDER_SITE_OTHER): Payer: Medicare Other | Admitting: Internal Medicine

## 2019-09-23 ENCOUNTER — Encounter: Payer: Self-pay | Admitting: Internal Medicine

## 2019-09-23 VITALS — BP 118/72 | HR 82 | Temp 98.0°F | Ht 73.0 in | Wt 198.8 lb

## 2019-09-23 DIAGNOSIS — J209 Acute bronchitis, unspecified: Secondary | ICD-10-CM | POA: Diagnosis not present

## 2019-09-23 DIAGNOSIS — J440 Chronic obstructive pulmonary disease with acute lower respiratory infection: Secondary | ICD-10-CM | POA: Insufficient documentation

## 2019-09-23 DIAGNOSIS — R0602 Shortness of breath: Secondary | ICD-10-CM | POA: Diagnosis not present

## 2019-09-23 MED ORDER — PREDNISONE 20 MG PO TABS: 20.0000 mg | ORAL_TABLET | Freq: Every day | ORAL | 1 refills | Status: DC

## 2019-09-23 MED ORDER — AZITHROMYCIN 250 MG PO TABS: ORAL_TABLET | ORAL | 0 refills | Status: DC

## 2019-09-23 NOTE — Patient Instructions (Signed)
Prednisone 20 mg daily for 10 days Start Z pak  Check ONO, PFT's, check CXR, 6WMT

## 2019-09-23 NOTE — Progress Notes (Signed)
Name: Raymond Barnett MRN: [id number] DOB: 1930-03-31     CONSULTATION DATE: 09/23/2019  REFERRING MD : self  CHIEF COMPLAINT: SOB  HISTORY OF PRESENT ILLNESS: 86 yo White male with ongoing chest congestion with nocturnal mild productive cough Started 1 month ago associated with loss of appetitive and weight loss Patient voice getting hoarse as well Coughing keeps him up at night, patient does have occasional wheezing  CARDIOLOGY ASSESSMENT HFpEF Symptoms stable (NYHA class III).  [name] appears grossly euvolemic on exam.  I have recommended catheterization to better evaluate the etiology of his cardiomyopathy (most likely ischemic given inferior Q waves on EKG and regional wall motion abnormality on echo).  He does not wish to pursue and invasive testing  PAST MEDICAL HISTORY :   has a past medical history of Arthritis, Benign prostate hyperplasia, Cataracts, bilateral, DDD (degenerative disc disease), lumbar, Glaucoma, H/O carpal tunnel syndrome, History of osteomyelitis (1953), Hypertension, and Thrombocytopenia ([place]).  has a past surgical history that includes Leg Surgery (Right, 1953); Carpal tunnel release (Left); Carpal tunnel release (Right); Total knee arthroplasty (Right); Colonoscopy (2015); and Skin graft (Right, 05/01/2017). Prior to Admission medications   Medication Sig Start Date End Date Taking? Authorizing Provider  aspirin 81 MG tablet Take 1 tablet by mouth daily. 11/07/09   [provider]  budesonide-formoterol (SYMBICORT) 160-4.5 MCG/ACT inhaler Inhale 2 puffs into the lungs 2 (two) times daily.    [provider]  Calcium Carb-Cholecalciferol (CALCIUM 600 + D) 600-200 MG-UNIT TABS Take 1 tablet by mouth daily. Patient takes 1000 mg daily.    [provider]  Cholecalciferol (VITAMIN D3) 1000 units CAPS  07/21/17   [provider]  finasteride (PROSCAR) 5 MG tablet Take 5 mg by mouth daily.    [provider]    fluticasone (FLONASE) 50 MCG/ACT nasal spray Place 2 sprays into both nostrils daily. Use for 4-6 weeks then stop and use seasonally or as needed. 04/17/18   [name], [name], DO  Lidocaine-Hydrocortisone Ace 2-2 % KIT Use rectal suppository twice daily as needed for up to 1 week for hemorrhoids 06/18/17   [name], DO  lisinopril (PRINIVIL,ZESTRIL) 5 MG tablet Take 1 tablet (5 mg total) by mouth daily. 09/02/16   [name], [name], DO  meloxicam (MOBIC) 15 MG tablet Take 1 tablet (15 mg total) by mouth daily. Take with food for 2-4 weeks then as needed 05/26/18   [name], DO  timolol (TIMOPTIC) 0.5 % ophthalmic solution 1 drop 2 (two) times daily.    [provider]  ULTRA FRESH 0.5 % SOLN  10/25/17   [provider]   No Known Allergies  FAMILY HISTORY:  family history includes AAA (abdominal aortic aneurysm) in his father; Colon cancer (age of onset: 96) in his mother; Heart disease in his father; Hyperlipidemia in his sister, sister, and sister; Hypertension in his sister, sister, and sister; Thrombosis (age of onset: 71) in his father. SOCIAL HISTORY:  reports that he quit smoking about 26 years ago. His smoking use included cigarettes. He has a 30.00 pack-year smoking history. He has quit using smokeless tobacco. He reports previous alcohol use. He reports that he does not use drugs.    Review of Systems:  Gen:  Denies  fever, sweats, chills weigh loss  HEENT: Denies blurred vision, double vision, ear pain, eye pain, hearing loss, nose bleeds, sore throat Cardiac:  No dizziness, chest pain or heaviness, chest tightness,edema, No JVD Resp: +  cough, -sputum production, +shortness of breath,+wheezing, -hemoptysis,  Gi: Denies swallowing difficulty, stomach pain, nausea or vomiting, diarrhea, constipation, bowel incontinence Gu:  Denies bladder incontinence, burning urine Ext:   Denies Joint pain, stiffness or swelling Skin:  Denies  skin rash, easy bruising or bleeding or hives Endoc:  Denies polyuria, polydipsia , polyphagia or weight change Psych:   Denies depression, insomnia or hallucinations  Other:  All other systems negative     BP 118/72 (BP Location: Right Arm, Cuff Size: Normal)   Pulse 82   Temp 98 F (36.7 C) (Oral)   Ht '6\' 1"'  (1.854 m)   Wt 198 lb 12.8 oz (90.2 kg)   SpO2 95%   BMI 26.23 kg/m     Physical Examination:   GENERAL:NAD, no fevers, chills, no weakness no fatigue HEAD: Normocephalic, atraumatic.  EYES: PERLA, EOMI No scleral icterus.  MOUTH: Moist mucosal membrane.  EAR, NOSE, THROAT: Clear without exudates. No external lesions.  NECK: Supple.  PULMONARY: CTA B/L no wheezing, rhonchi, crackles CARDIOVASCULAR: S1 and S2. Regular rate and rhythm. No murmurs GASTROINTESTINAL: Soft, nontender, nondistended. Positive bowel sounds.  MUSCULOSKELETAL: No swelling, clubbing, or edema.  NEUROLOGIC: No gross focal neurological deficits. 5/5 strength all extremities SKIN: No ulceration, lesions, rashes, or cyanosis.  PSYCHIATRIC: Insight, judgment intact. -depression -anxiety ALL OTHER ROS ARE NEGATIVE   MEDICATIONS: I have reviewed all medications and confirmed regimen as documented         ASSESSMENT AND PLAN SYNOPSIS  86 yo white male with signs and symptoms of acute bronchitis  Recommend prednisone 20 mgh daily for 10 days Start z pak Obtain CXR   Patient is a former smoker quit 35 years ago I recommend PFT's to assess lung function Recommend ONO and 6WMT to assess for hypoxia   COVID-19 EDUCATION: The signs and symptoms of COVID-19 were discussed with the patient and how to seek care for testing (follow up with PCP or arrange E-visit).  The importance of social distancing was discussed today.  MEDICATION ADJUSTMENTS/LABS AND TESTS ORDERED: Prednisone 20 mg daily for 10 days Start Z pak  Check ONO, PFT's, check CXR, 6WMT CURRENT MEDICATIONS REVIEWED AT  LENGTH WITH PATIENT TODAY   Patient/Family are satisfied with Plan of action and management. All questions answered  Follow up 6 months Total Time Spent 32 mins  Maretta Bees Patricia Pesa, M.D.  [facility]  Medical Director [place] Director [facility] Cardio-Pulmonary Department

## 2019-09-24 ENCOUNTER — Telehealth: Payer: Self-pay | Admitting: Internal Medicine

## 2019-09-24 DIAGNOSIS — J189 Pneumonia, unspecified organism: Secondary | ICD-10-CM

## 2019-09-24 DIAGNOSIS — J440 Chronic obstructive pulmonary disease with acute lower respiratory infection: Secondary | ICD-10-CM

## 2019-09-24 NOTE — Telephone Encounter (Signed)
Dr. Blanchie Serve from Radiology called in regards to the patient's CXR yesterday wanted to let you know that:  Underlying fibrotic type change. Ill-defined opacity left upper lobe not present previously; question developing pneumonia left upper lobe.  They want a followup CXR and lateral chest radiographs recommended in 3-4 weeks following trial of antibiotic therapy to ensure resolution and exclude underlying malignancy.  Please advise

## 2019-09-24 NOTE — Telephone Encounter (Signed)
Dr. Mortimer Fries sent secure chat message asking me to call patient and let him know that he may have Pneumonia. Patient was prescribed Prednisone and Z-pak yesterday at appointment instructed patient to complete all of the medication and then we will repeat his CXR in 3-4 weeks. Patient is scheduled with Dr. Patsey Berthold on 6/25 at 11am. Patient expressed understanding. Nothing further needed at this time.

## 2019-09-27 NOTE — Telephone Encounter (Signed)
Ok thank you 

## 2019-09-30 ENCOUNTER — Other Ambulatory Visit: Payer: Self-pay

## 2019-09-30 DIAGNOSIS — J209 Acute bronchitis, unspecified: Secondary | ICD-10-CM

## 2019-10-06 ENCOUNTER — Other Ambulatory Visit: Payer: Self-pay

## 2019-10-06 DIAGNOSIS — J209 Acute bronchitis, unspecified: Secondary | ICD-10-CM

## 2019-10-12 ENCOUNTER — Ambulatory Visit: Payer: Medicare Other

## 2019-10-12 ENCOUNTER — Ambulatory Visit
Admission: RE | Admit: 2019-10-12 | Discharge: 2019-10-12 | Disposition: A | Discharge status: Home / Self Care | Payer: Medicare Other | Source: Ambulatory Visit | Attending: Internal Medicine | Admitting: Internal Medicine

## 2019-10-12 ENCOUNTER — Ambulatory Visit
Admission: RE | Admit: 2019-10-12 | Discharge: 2019-10-12 | Disposition: A | Discharge status: Home / Self Care | Payer: Medicare Other | Attending: Internal Medicine | Admitting: Internal Medicine

## 2019-10-12 DIAGNOSIS — J189 Pneumonia, unspecified organism: Secondary | ICD-10-CM

## 2019-10-12 DIAGNOSIS — J209 Acute bronchitis, unspecified: Secondary | ICD-10-CM

## 2019-10-12 DIAGNOSIS — J440 Chronic obstructive pulmonary disease with acute lower respiratory infection: Secondary | ICD-10-CM | POA: Insufficient documentation

## 2019-10-14 IMAGING — CT CT CHEST W/ CM
2 of 4 series · 15 of 36 positions shown, 18 images · IV contrast (iopamidol)
Comparison: Chest radiographs 02/12/2017

CLINICAL DATA: 87-year-old male with cough and shortness of breath.
Former smoker, but quit many years ago. Patchy lung base opacity on
chest radiographs in [REDACTED].

EXAM:
CT CHEST WITH CONTRAST
TECHNIQUE: Multidetector CT imaging of the chest was performed during
intravenous contrast administration.
CONTRAST:  75mL KKC7OO-ISS IOPAMIDOL (KKC7OO-ISS) INJECTION 61%

[Series 3: thorax 2.00 hr68 s3 · axial · 0.73mm/px · z∈[-1275,-991]mm · 12 of 168 slices shown, 15 images]
[im 13/168  mediastinal]
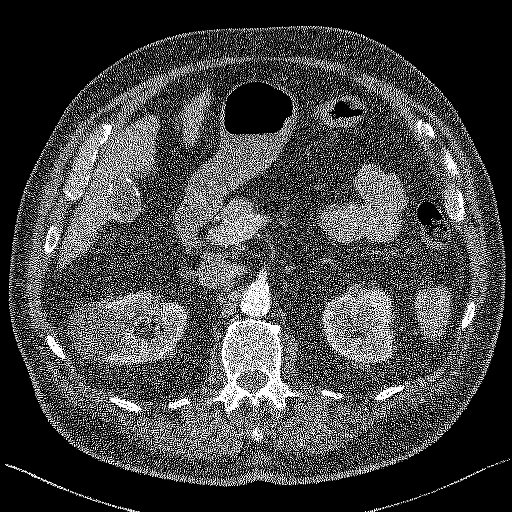
[im 13/168  lung]
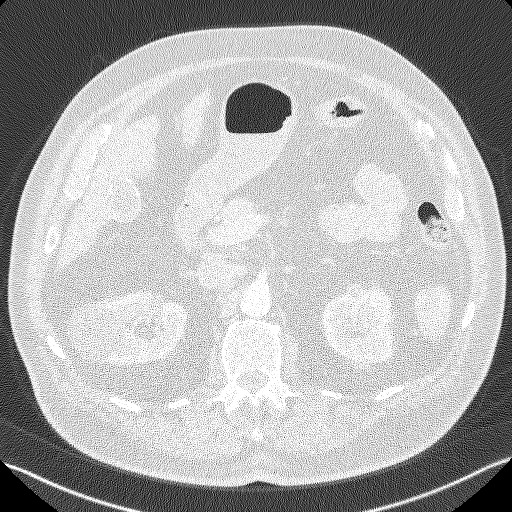
[im 26/168  lung]
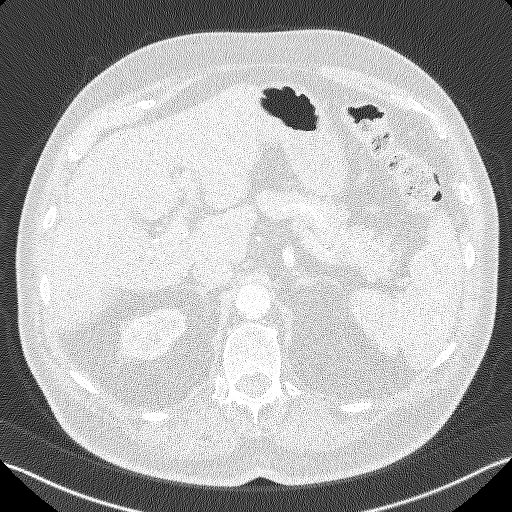
[im 39/168  lung]
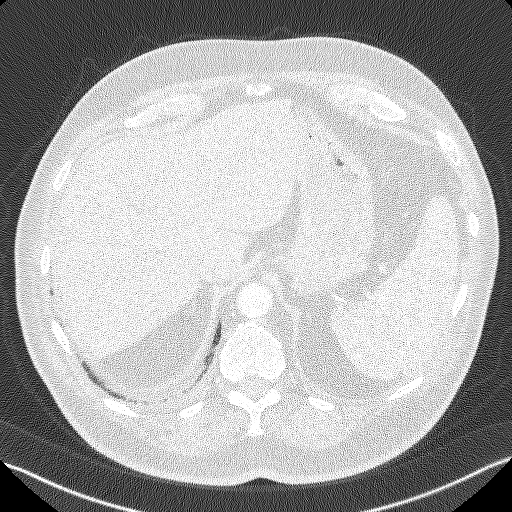
[im 52/168  lung]
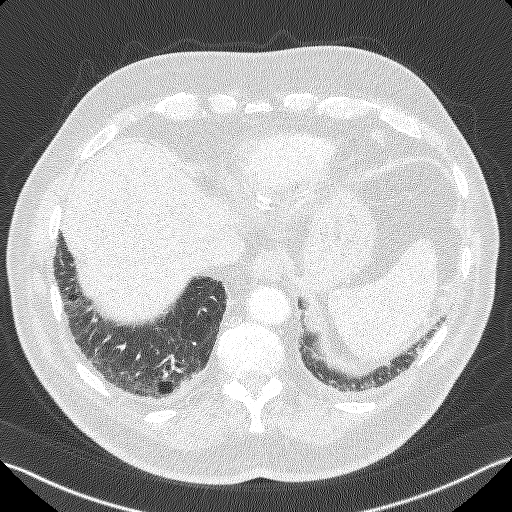
[im 65/168  mediastinal]
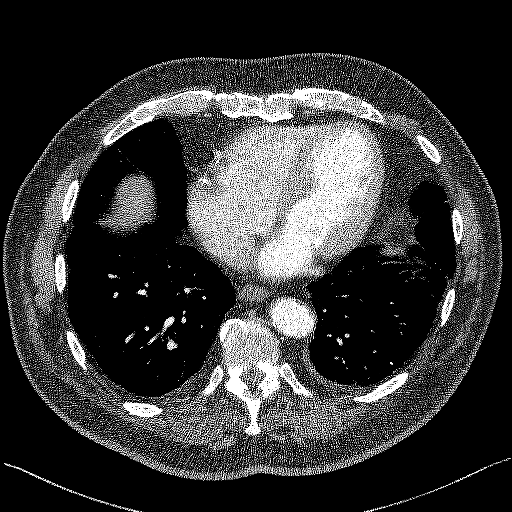
[im 65/168  lung]
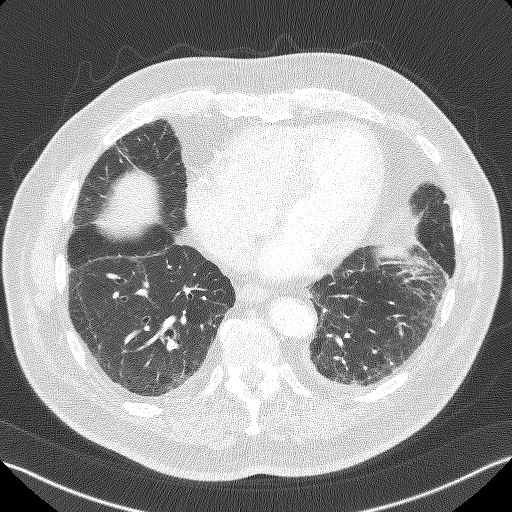
[im 78/168  lung]
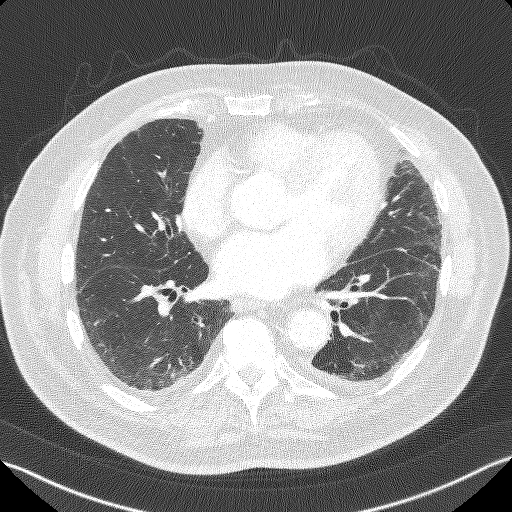
[im 90/168  lung]
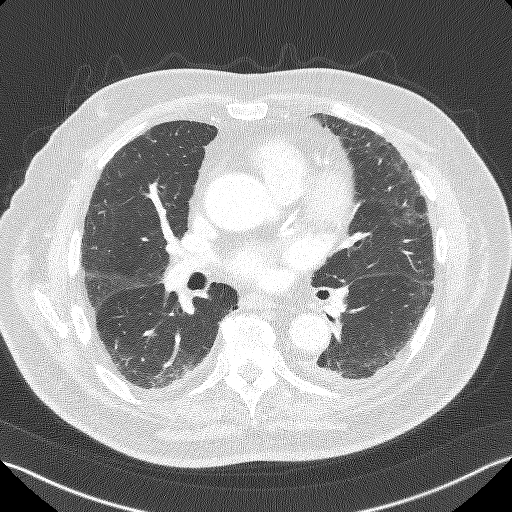
[im 103/168  lung]
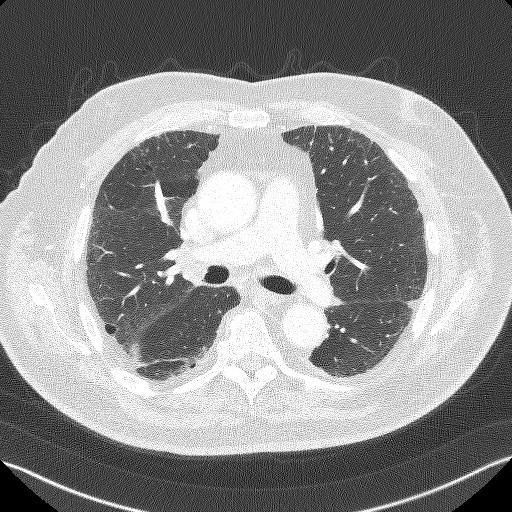
[im 116/168  mediastinal]
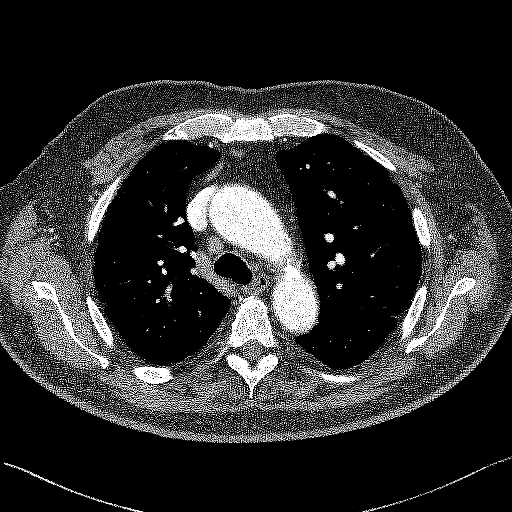
[im 116/168  lung]
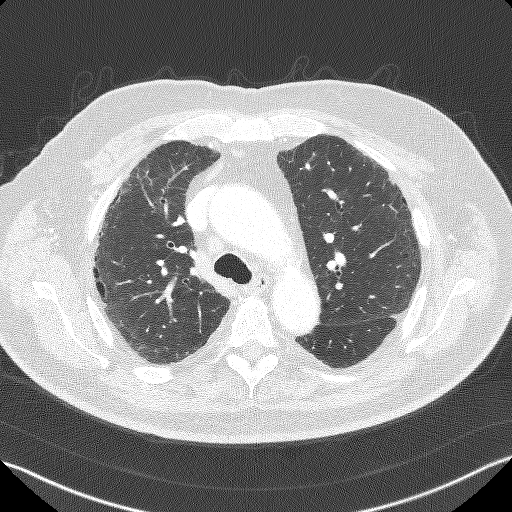
[im 129/168  lung]
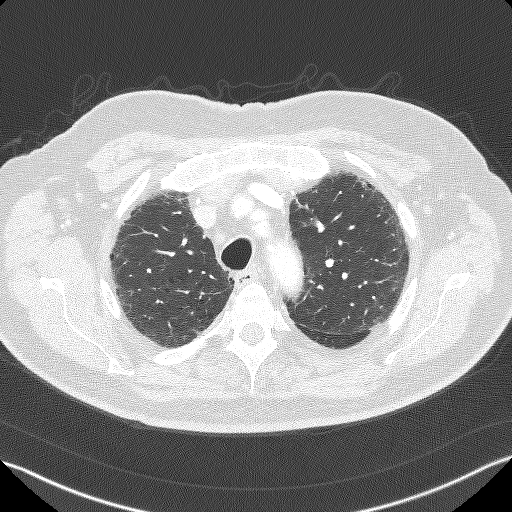
[im 142/168  lung]
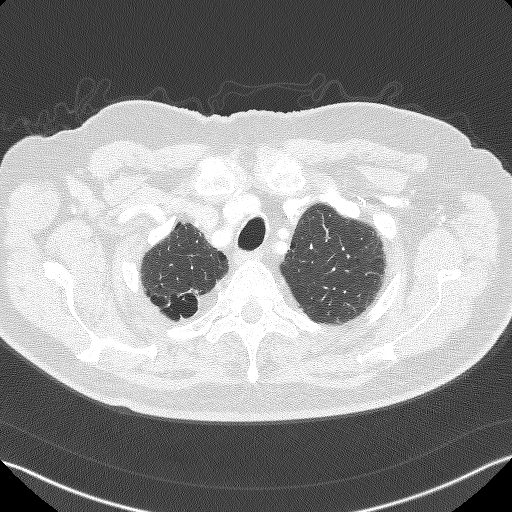
[im 155/168  lung]
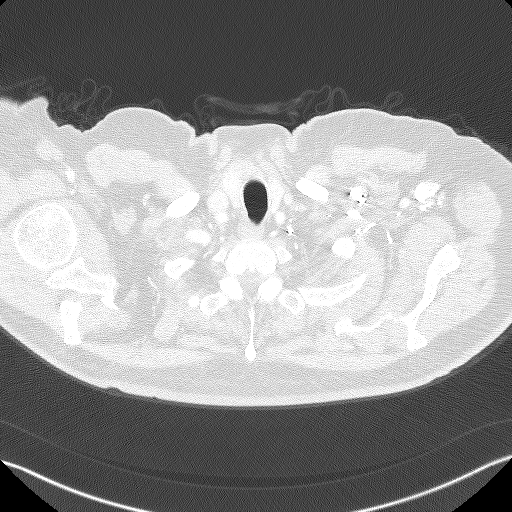

[Series 6: thorax 2.00 br36 s3 cor · coronal · 0.65mm/px · 3 of 172 slices shown]
[im 35/172  lung]
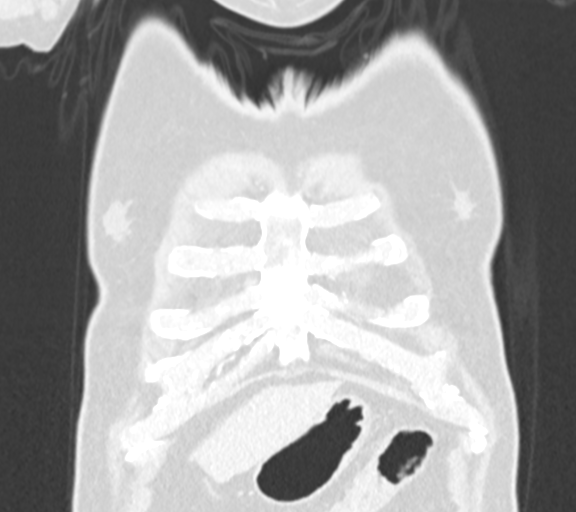
[im 69/172  lung]
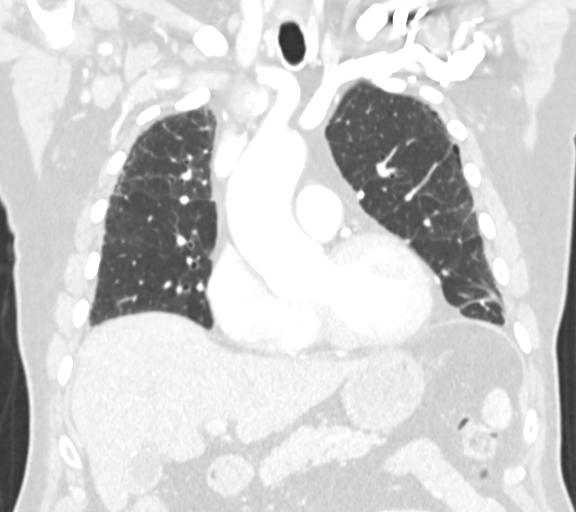
[im 103/172  lung]
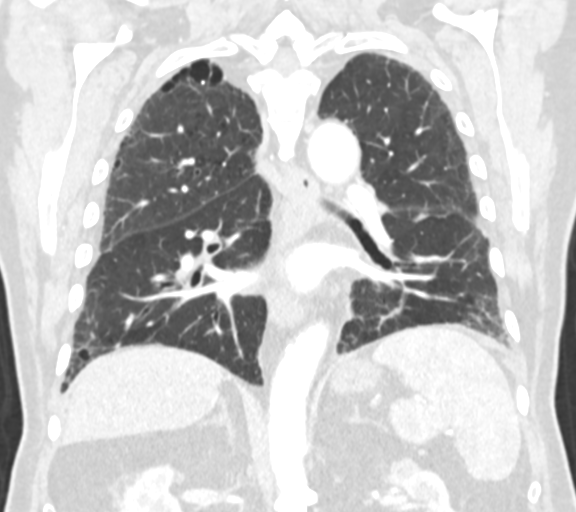

[15 of 36 positions shown; findings below may reference images not displayed]

FINDINGS: Cardiovascular: Calcified coronary artery atherosclerosis. Soft and
calcified plaque in the thoracic aorta, at the arch, and at the
proximal great vessels. This includes estimated 60-70% stenosis at
the left subclavian artery origin (series 4, image 41). There is
more substantial appearing soft and calcified plaque in the visible
proximal abdominal aorta at the renal artery levels (series 4, image
166).

Other major mediastinal vasculature is enhancing and appears patent.
Mild cardiomegaly. No pericardial effusion.

Mediastinum/Nodes: Mediastinal and hilar lymph nodes are within
normal limits. No mediastinal mass.

Lungs/Pleura: Bilateral subpleural emphysema and subpleural
increased reticular opacity with some areas of mild architectural
distortion. Subpleural opacity is maximal in the lower lobes and
costophrenic angles. The major airways are patent with mild central
peribronchial thickening. Superimposed mild gas trapping at the lung
bases including the left lingula lateral costophrenic angle (series
3, image 103). No consolidation. No definite pleural effusion.

Upper Abdomen: Rim calcified gallbladder versus an oval
centimeter gallstone (series 6, image 65). No intra-or extrahepatic
biliary ductal enlargement. No pericholecystic inflammation.
Negative liver otherwise. Visible spleen, pancreas, and adrenal
glands are within normal limits. Negative bowel in the upper abdomen
aside from diverticulosis at the visible splenic flexure. Simple
fluid density cyst in the lateral right renal midpole. Smaller
low-density areas in the bilateral upper poles likely are additional
benign cysts.

Musculoskeletal: No acute osseous abnormality identified. No
suspicious osseous lesion.
IMPRESSION: 1. Chronic lung disease with paraseptal Emphysema (7116C-26U.Z) and
possible developing pulmonary fibrosis. No superimposed acute
pulmonary finding identified.
2. Coronary artery and Aortic Atherosclerosis (7116C-3Z9.9). Left
subclavian artery atherosclerotic stenosis estimated at 60-70%.
Partially visible upper abdominal aorta atherosclerosis.
3. Cholelithiasis versus porcelain gallbladder. No CT evidence of
acute cholecystitis or biliary obstruction.

## 2019-10-15 ENCOUNTER — Other Ambulatory Visit: Payer: Self-pay

## 2019-10-15 ENCOUNTER — Ambulatory Visit (INDEPENDENT_AMBULATORY_CARE_PROVIDER_SITE_OTHER): Payer: Medicare Other | Admitting: Pulmonary Disease

## 2019-10-15 ENCOUNTER — Encounter: Payer: Self-pay | Admitting: Pulmonary Disease

## 2019-10-15 ENCOUNTER — Telehealth: Payer: Self-pay | Admitting: Family Medicine

## 2019-10-15 VITALS — BP 122/78 | HR 77 | Temp 98.1°F | Ht 73.0 in | Wt 198.8 lb

## 2019-10-15 DIAGNOSIS — R0602 Shortness of breath: Secondary | ICD-10-CM | POA: Diagnosis not present

## 2019-10-15 DIAGNOSIS — R05 Cough: Secondary | ICD-10-CM

## 2019-10-15 DIAGNOSIS — J438 Other emphysema: Secondary | ICD-10-CM

## 2019-10-15 DIAGNOSIS — I10 Essential (primary) hypertension: Secondary | ICD-10-CM

## 2019-10-15 DIAGNOSIS — J189 Pneumonia, unspecified organism: Secondary | ICD-10-CM

## 2019-10-15 DIAGNOSIS — R059 Cough, unspecified: Secondary | ICD-10-CM

## 2019-10-15 DIAGNOSIS — J849 Interstitial pulmonary disease, unspecified: Secondary | ICD-10-CM

## 2019-10-15 MED ORDER — BREZTRI AEROSPHERE 160-9-4.8 MCG/ACT IN AERO: 2.0000 | INHALATION_SPRAY | Freq: Two times a day (BID) | RESPIRATORY_TRACT | 0 refills | Status: AC

## 2019-10-15 NOTE — Patient Instructions (Signed)
What we discussed today:  In addition to the breathing tests we are also going to obtain a heart test to evaluate your heart function because it has been shown to be somewhat weak in the past.  This can also affect your breathing.  We are giving you a trial of Breztri 2 inhalations twice a day this will replace your Symbicort.  Put your Symbicort aside.  You are getting a 1 month supply of the medication please let us know how that works for you.  I will send a note to  your primary physician, Dr. Parks Ranger, to change your blood pressure medicine as the one medication you are on can cause a lot of upper airway issues.  You will be seen in follow-up in 4 to 6 weeks time with either me or the nurse practitioner.

## 2019-10-15 NOTE — Telephone Encounter (Signed)
Patient was seen by Dr Patsey Berthold at [facility] today Raymond Barnett 6/25. Part of the discussion was that he takes Lisinopril 5mg  daily for BP, however this may be causing him some respiratory side effects with throat clearing and hoarse voice due to a side effect. This can even happen after taking the medication for several years.  Dr Patsey Berthold asked our office to reach out to the patient to recommend that we discontinue Lisinopril and switch to a SIMILAR BP medication called Losartan that does not have this side effect.  Let me know if he prefers same [employer] and 30 vs 90 day rx.  START Losartan 25mg  daily STOP Lisinopril 5mg  daily  We can follow-up on this in July 2021 at his upcoming appointment.  Nobie Putnam, [facility] 10/15/2019, 6:07 PM

## 2019-10-15 NOTE — Progress Notes (Signed)
Subjective:    Patient ID: Raymond Barnett, male    DOB: 1929-12-31, 86 y.o.   MRN: [id number]  HPI 86 year old remote former smoker saw Dr. Mortimer Fries on 23 September 2019 for the issue of shortness of breath.  He was noted to have at that time left upper lobe pneumonia.  He was treated appropriately for the same.  Patient also has a history of congestive heart failure with preserved ejection fraction has issues with COPD/emphysema and associated pulmonary fibrosis.  No prior PFTs.  Patient presents today for follow-up with regards to his pneumonia he had a chest x-ray performed on 22 June that showed resolution of the infiltrate.  He continues to have shortness of breath.  Patient also notices constant throat clearing and hoarseness.  He also has nonproductive cough.  No hemoptysis.  He is on an ACE inhibitor.  No chest pain, fevers, chills or sweats, lower extremity edema or calf tenderness.  I have reviewed his chart in entirety including prior echo showing moderate pulmonary hypertension and diastolic dysfunction with mild reduction in LVEF.  Review of Systems A 10 point review of systems was performed and it is as noted above otherwise negative.  No Known Allergies  Current Meds  Medication Sig  . ASPIRIN LOW DOSE 81 MG chewable tablet Chew 81 mg by mouth daily.  . Calcium Carb-Cholecalciferol (CALCIUM 600 + D) 600-200 MG-UNIT TABS Take 1 tablet by mouth daily. Patient takes 1000 mg daily.  . cholecalciferol (VITAMIN D) 25 MCG (1000 UNIT) tablet   . finasteride (PROSCAR) 5 MG tablet Take 5 mg by mouth daily.  . fluticasone (FLONASE) 50 MCG/ACT nasal spray Place 2 sprays into both nostrils daily. Use for 4-6 weeks then stop and use seasonally or as needed.  . Lidocaine-Hydrocortisone Ace 2-2 % KIT Use rectal suppository twice daily as needed for up to 1 week for hemorrhoids  . lisinopril (PRINIVIL,ZESTRIL) 5 MG tablet Take 1 tablet (5 mg total) by mouth daily.  . LUBRICATING PLUS EYE DROPS 0.5 %  SOLN   . meloxicam (MOBIC) 15 MG tablet Take 1 tablet (15 mg total) by mouth daily. Take with food for 2-4 weeks then as needed  . timolol (TIMOPTIC) 0.5 % ophthalmic solution 1 drop 2 (two) times daily.  . [DISCONTINUED] azithromycin (ZITHROMAX Z-PAK) 250 MG tablet Take 2 tablets on Day 1 and then 1 tablet daily till gone.  . [DISCONTINUED] predniSONE (DELTASONE) 20 MG tablet Take 1 tablet (20 mg total) by mouth daily with breakfast. 10 days   Immunization History  Administered Date(s) Administered  . Fluad Quad(high Dose 65+) 02/09/2019  . Influenza Whole 01/24/2010  . Influenza, High Dose Seasonal PF 01/23/2016, 01/17/2017, 01/21/2018  . Influenza,inj,Quad PF,6+ Mos 02/03/2015  . PFIZER SARS-COV-2 Vaccination 04/26/2019, 05/17/2019   Social History   Tobacco Use  . Smoking status: Former Smoker    Packs/day: 1.00    Years: 30.00    Pack years: 30.00    Types: Cigarettes    Quit date: 1995    Years since quitting: 26.5  . Smokeless tobacco: Former Network engineer Use Topics  . Alcohol use: Not Currently    Comment: occasionally wine         Objective:   Physical Exam BP 122/78 (BP Location: Left Arm, Cuff Size: Normal)   Pulse 77   Temp 98.1 F (36.7 C) (Temporal)   Ht '6\' 1"'  (1.854 m)   Wt 198 lb 12.8 oz (90.2 kg)   SpO2 97%  BMI 26.23 kg/m   GENERAL: Well-developed well-nourished elderly gentleman in no acute distress.  He is fully ambulatory. HEAD: Normocephalic, atraumatic.  EYES: Pupils equal, round, reactive to light.  No scleral icterus.  MOUTH: Nose/mouth/throat not examined due to masking requirements for COVID 19. NECK: Supple. No thyromegaly. Trachea midline. No JVD.  No adenopathy. PULMONARY: Good air entry bilaterally.  No wheezes noted.  Coarse breath sounds no other adventitious sounds.   CARDIOVASCULAR: S1 and S2. Regular rate and rhythm.  GASTROINTESTINAL: Nondistended abdomen MUSCULOSKELETAL: No joint deformity, no clubbing, no edema.    NEUROLOGIC: No focal deficits noted.  No gait disturbance noted on ambulation.  Speech is fluent. SKIN: Intact,warm,dry. PSYCH: Mood and behavior normal.   Chest x-ray from 12 October 2019, independently reviewed: Previously noted left upper lobe infiltrate resolved.      Assessment & Plan:     ICD-10-CM   1. SOB (shortness of breath)  R06.02 ECHOCARDIOGRAM COMPLETE   We will order pulmonary function testing Order 2D echo Likely multifactorial due to combined lung/cardiac disease  2. ILD (interstitial lung disease) (HCC)  J84.9 Pulse oximetry, overnight   Poorly characterized interstitial lung disease Probably related to smoking Await PFTs We will likely follow with a high-resolution CT chest  3. Paraseptal emphysema (HCC)  J43.8    COPD with emphysema Severity unknown, PFTs pending Trial of Breztri  4. Pneumonia of left upper lobe due to infectious organism  J18.9    Resolved  5. Cough  R05    Discussed with primary care discontinuing ACE inhibitor Trial of respiratory as above   Meds ordered this encounter  Medications  . Budeson-Glycopyrrol-Formoterol (BREZTRI AEROSPHERE) 160-9-4.8 MCG/ACT AERO    Sig: Inhale 2 puffs into the lungs in the morning and at bedtime for 1 day.    Dispense:  5 g    Refill:  0    Order Specific Question:   Lot Number?    Answer:   [id number]    Order Specific Question:   Expiration Date?    Answer:   12/22/2020    Order Specific Question:   Manufacturer?    Answer:   [employer] [[id number]]    Order Specific Question:   Quantity    Answer:   2   Orders Placed This Encounter  Procedures  . Pulse oximetry, overnight    On room air.  TTS:VXBLT    Standing Status:   Future    Standing Expiration Date:   10/14/2020  . ECHOCARDIOGRAM COMPLETE    Standing Status:   Future    Standing Expiration Date:   04/15/2020    Order Specific Question:   Where should this test be performed    Answer:   [facility]    Order Specific Question:   Please  indicate who you request to read the echo results.    Answer:   [facility]    Order Specific Question:   Perflutren DEFINITY (image enhancing agent) should be administered unless hypersensitivity or allergy exist    Answer:   Administer Perflutren    Order Specific Question:   Reason for exam-Echo    Answer:   Dyspnea  786.09 / R06.00   Discussion:  Patient dyspnea appears to be multifactorial.  Suspect this is an element of COPD with associated interstitial lung disease related to smoking.  He will need pulmonary function testing to delve into this.  Additionally, the patient has issues with previously noted low EF of 45 to 50%.  Diastolic dysfunction grade 2 and at least moderate pulmonary hypertension.  We will repeat 2D echo to reevaluate these issues.  These may be playing a part in his sensation of dyspnea.  He has had issues with cough persistent after his pneumonia will recommend that ACE inhibitor be discontinued as this can aggravate the cough issue.  We are switching the patient from Symbicort to [facility] to see if this helps with his dyspnea.  He may need a high-resolution CT to follow.  He will see [place] in follow-up in 4 to 6 weeks time, he is to contact us prior to that time should any new difficulties arise.  Renold Don, MD [facility]   *This note was dictated using voice recognition software/Dragon.  Despite best efforts to proofread, errors can occur which can change the meaning.  Any change was purely unintentional.

## 2019-10-18 ENCOUNTER — Encounter: Payer: Self-pay | Admitting: Pulmonary Disease

## 2019-10-18 MED ORDER — LOSARTAN POTASSIUM 25 MG PO TABS: 25.0000 mg | ORAL_TABLET | Freq: Every day | ORAL | 1 refills | Status: DC

## 2019-10-18 NOTE — Telephone Encounter (Signed)
Patient prefers 90 days Rx --Environmental manager.

## 2019-10-18 NOTE — Telephone Encounter (Signed)
DC'd Lisinopril 5mg   Ordered Losartan 25mg  daily 90 day [facility] +1 refill  Nobie Putnam, DO [facility] 10/18/2019, 10:44 AM

## 2019-10-19 ENCOUNTER — Telehealth: Payer: Self-pay

## 2019-10-19 ENCOUNTER — Ambulatory Visit (INDEPENDENT_AMBULATORY_CARE_PROVIDER_SITE_OTHER): Payer: Medicare Other

## 2019-10-19 ENCOUNTER — Telehealth: Payer: Self-pay | Admitting: Internal Medicine

## 2019-10-19 ENCOUNTER — Other Ambulatory Visit: Payer: Self-pay

## 2019-10-19 DIAGNOSIS — R0609 Other forms of dyspnea: Secondary | ICD-10-CM

## 2019-10-19 DIAGNOSIS — R0600 Dyspnea, unspecified: Secondary | ICD-10-CM | POA: Diagnosis not present

## 2019-10-19 DIAGNOSIS — J440 Chronic obstructive pulmonary disease with acute lower respiratory infection: Secondary | ICD-10-CM

## 2019-10-19 NOTE — Telephone Encounter (Signed)
Pt declined ONO, as he did not wish to drive to [facility] in [place] to pickup machine. Pt was made aware by [facility] that they can not deliver or mail.  Suanne Marker, is there a company that will mail machine to pt?

## 2019-10-19 NOTE — Telephone Encounter (Signed)
Patient has been contacted at least 3 times for a recall, recall has been deleted

## 2019-10-19 NOTE — Progress Notes (Signed)
Pt presents to office for SMW in wheelchair. Pt expressed concerns about walking for 6 minutes and questioned "who was going to pick him up if he fell". Dr. Patsey Berthold gave verbal to do qualifying walk instead. Pt was able to complete two laps and asked to stop due to sob. No desats.

## 2019-10-19 NOTE — Telephone Encounter (Signed)
ATC patient, no answer. LMOVM that order has been sent to [facility] to arrange ONO. [facility] will deliver the device and also arrange to pick the device up once test has been completed.  Any questions, please feel free to contact me at [phone] and ask for [facility]. Rhonda J Cobb

## 2019-10-19 NOTE — Telephone Encounter (Signed)
Order sent to [facility] to arrange ONO on RA for patient. Rhonda J Cobb

## 2019-10-27 ENCOUNTER — Telehealth: Payer: Self-pay | Admitting: Pulmonary Disease

## 2019-10-27 NOTE — Telephone Encounter (Signed)
Bubba Hales, Thornell Sartorius, Jamas Lav; [employer]et Latch   ONO has been cancelled  Per Leah with [facility].  Called patient to and advised that ONO with [facility] has been canceled. Nothing else needed at this time. Rhonda J Cobb

## 2019-10-27 NOTE — Telephone Encounter (Signed)
CM sent to Darlina Guys, Alyse Low and Skeet Latch to cancel ONO scheduled for 11/03/2019. Pt has another DME now. Waiting on response from [facility]. Rhonda J Cobb

## 2019-10-27 NOTE — Telephone Encounter (Signed)
Pt has been advised that [facility] only will be delivering this device tomorrow that the order with [facility] has been canceled.  Nothing else needed at this time. Pt aware. Rhonda J Cobb

## 2019-10-27 NOTE — Telephone Encounter (Signed)
Called and spoke to pt, who stated that he received a call from both [place] and [facility] regarding ONO.  [facility] is delivering machine 10/28/2019 and [facility] on 11/03/2019. Pt would like to cancel order with [facility].   Suanne Marker, can you help with this?

## 2019-11-01 ENCOUNTER — Telehealth: Payer: Self-pay | Admitting: Pulmonary Disease

## 2019-11-01 DIAGNOSIS — J849 Interstitial pulmonary disease, unspecified: Secondary | ICD-10-CM

## 2019-11-01 NOTE — Telephone Encounter (Signed)
Raymond Barnett is aware that ONO was received this morning and placed in Dr. Domingo Dimes folder for review.  Raymond Barnett is aware that pt will be notified once results have been reviewed by provider.

## 2019-11-01 NOTE — Telephone Encounter (Signed)
ONO has been reviewed by Dr. Patsey Berthold- recommend 2L QHS.  Order has been placed for nighttime oxygen, as pt wished to proceed. Nothing further is needed.

## 2019-11-08 ENCOUNTER — Telehealth: Payer: Self-pay

## 2019-11-08 NOTE — Telephone Encounter (Signed)
Pt is aware date/time of covid test prior to PFT. [facility]/10/2019 [facility]

## 2019-11-09 ENCOUNTER — Other Ambulatory Visit: Payer: Self-pay

## 2019-11-09 ENCOUNTER — Encounter: Payer: Self-pay | Admitting: Family Medicine

## 2019-11-09 ENCOUNTER — Ambulatory Visit (INDEPENDENT_AMBULATORY_CARE_PROVIDER_SITE_OTHER): Payer: Medicare Other | Admitting: Family Medicine

## 2019-11-09 VITALS — BP 121/54 | HR 72 | Temp 96.9°F | Resp 16 | Ht 73.0 in | Wt 201.6 lb

## 2019-11-09 DIAGNOSIS — I5032 Chronic diastolic (congestive) heart failure: Secondary | ICD-10-CM | POA: Diagnosis not present

## 2019-11-09 DIAGNOSIS — J3089 Other allergic rhinitis: Secondary | ICD-10-CM

## 2019-11-09 DIAGNOSIS — J302 Other seasonal allergic rhinitis: Secondary | ICD-10-CM

## 2019-11-09 DIAGNOSIS — J438 Other emphysema: Secondary | ICD-10-CM | POA: Diagnosis not present

## 2019-11-09 MED ORDER — MONTELUKAST SODIUM 10 MG PO TABS: 10.0000 mg | ORAL_TABLET | Freq: Every day | ORAL | 3 refills | Status: DC

## 2019-11-09 MED ORDER — LORATADINE 10 MG PO TABS: 10.0000 mg | ORAL_TABLET | Freq: Every day | ORAL | 3 refills | Status: DC

## 2019-11-09 MED ORDER — FLUTICASONE PROPIONATE 50 MCG/ACT NA SUSP: 2.0000 | Freq: Every day | NASAL | 5 refills | Status: AC

## 2019-11-09 NOTE — Patient Instructions (Addendum)
Thank you for coming to the office today.  Keep on plan with Dr Patsey Berthold - after your breathing test and further testing.  Keep on current oxygen treatment as advised.  Start Singulair 10mg  nightly. Start nasal steroid Flonase 2 sprays in each nostril daily for 4-6 weeks, may repeat course seasonally or as needed Start Loratadine  Recommend trial of Anti-inflammatory with Naproxen (Naprosyn) 500mg  tabs - take one with food and plenty of water TWICE daily every day (breakfast and dinner), for next 1-2 weeks, then you may take only as needed - DO NOT TAKE any ibuprofen, aleve, motrin while you are taking this medicine - It is safe to take Tylenol Ext Str 500mg  tabs - take 1 to 2 (max dose 1000mg ) every 6 hours as needed for breakthrough pain, max 24 hour daily dose is 6 to 8 tablets or 4000mg    Please schedule a Follow-up Appointment to: Return in about 4 months (around 03/11/2020) for 4 month follow-up Emphysema / Pulm / Allergies.  If you have any other questions or concerns, please feel free to call the office or send a message through [place]. You may also schedule an earlier appointment if necessary.  Additionally, you may be receiving a survey about your experience at our office within a few days to 1 week by e-mail or mail. We value your feedback.  Nobie Putnam, DO [place]

## 2019-11-09 NOTE — Progress Notes (Signed)
Subjective:    Patient ID: Raymond Barnett, male    DOB: [birth year], 86 y.o.   MRN: [id number]  Raymond Barnett is a 86 y.o. male presenting on 11/09/2019 for Emphysema short of breath (given oxygen for nights -getting 2 liters) and Hypertension  Accompanied by wife Kathlee Nations.  HPI   Paraseptal Emphysema / Chronic Dyspnea Seasonal Environmental Allergies / Allergic Rhinitis Peripheral Arterial Disease PAD / Heart Failure with preserved ejection fracture NYHA III  - Last visit with me 06/2019, for same problem, treated with restart Symbicort and referral to Pulmonology, see prior notes for background information. - Interval update with has been following [facility] previously Dr Mortimer Fries, now Dr Patsey Berthold, see office notes, has had treatment for LUL PNA in past. Has had 6 min walk test and also ECHO with diasystolic dysfunction and mod pulm HTN, also has had X-ray in past, no PFTs completed, has had overnight oximetry test with showed needed 2 L O2, pulmonology thought his dyspnea is multifactorial, thought to be COPD / Interstitial lung disease, additionally CHF in past showed mod pulm HTN diastolic dysfunction that can contribute, also ACEi cough in past has been switched. She has changed his med from Symbicort to [place] as well and future may need high res CT.  He has upcoming PFTs and upcoming ECHO repeat.  - Today patient reports he has worse issues after stop allergy meds. Wants to restart Loratadine and Flonase. Needs new rx. Says has drainage post nasal worse if lay down, triggers him to cough at time  He is former smoker  He does not noticed any significant improvement on Breztri but still trying to use it.  Additional concern with joint pain knees, he asks to resume his naproxen he has the medicine at home.  - From prior visit with Cardiology - he has declined these medications, Remains off Rosuvastatin, Metoprolol Denies chest pain, dyspnea, edema, claudication, or fever chills  productive cough chest pain    Depression screen [facility] 2/9 11/09/2019 07/06/2019 06/08/2019  Decreased Interest 0 0 0  Down, Depressed, Hopeless 0 0 0  PHQ - 2 Score 0 0 0  Altered sleeping - - -  Tired, decreased energy - - -  Change in appetite - - -  Feeling bad or failure about yourself  - - -  Trouble concentrating - - -  Moving slowly or fidgety/restless - - -  Suicidal thoughts - - -  PHQ-9 Score - - -  Difficult doing work/chores - - -    Social History   Tobacco Use  . Smoking status: Former Smoker    Packs/day: 1.00    Years: 30.00    Pack years: 30.00    Types: Cigarettes    Quit date: 1995    Years since quitting: 26.5  . Smokeless tobacco: Former Network engineer  . Vaping Use: Never used  Substance Use Topics  . Alcohol use: Not Currently    Comment: occasionally wine   . Drug use: No    Review of Systems Per HPI unless specifically indicated above     Objective:    BP (!) 121/54   Pulse 72   Temp (!) 96.9 F (36.1 C) (Temporal)   Resp 16   Ht 6\' 1"  (1.854 m)   Wt 201 lb 9.6 oz (91.4 kg)   SpO2 99%   BMI 26.60 kg/m   Wt Readings from Last 3 Encounters:  11/09/19 201 lb 9.6 oz (91.4 kg)  10/15/19 198 lb 12.8 oz (90.2 kg)  09/23/19 198 lb 12.8 oz (90.2 kg)    Physical Exam Vitals and nursing note reviewed.  Constitutional:      General: He is not in acute distress.    Appearance: He is well-developed. He is not diaphoretic.     Comments: Well-appearing elderly 86 yr old male, comfortable, cooperative  HENT:     Head: Normocephalic and atraumatic.  Eyes:     General:        Right eye: No discharge.        Left eye: No discharge.     Conjunctiva/sclera: Conjunctivae normal.     Pupils: Pupils are equal, round, and reactive to light.  Neck:     Thyroid: No thyromegaly.  Cardiovascular:     Rate and Rhythm: Normal rate and regular rhythm.     Heart sounds: Normal heart sounds. No murmur heard.   Pulmonary:     Effort: Pulmonary effort  is normal. No respiratory distress.     Breath sounds: No wheezing or rales.     Comments: Mild reduced air movement diffusely. No increased work of breathing. No coughing or crackles. Abdominal:     General: Bowel sounds are normal. There is no distension.     Palpations: Abdomen is soft. There is no mass.     Tenderness: There is no abdominal tenderness.  Musculoskeletal:        General: No tenderness. Normal range of motion.     Cervical back: Normal range of motion and neck supple.     Comments: Upper / Lower Extremities: - Normal muscle tone, strength bilateral upper extremities 5/5, lower extremities 5/5, uses cane for assistance  Lymphadenopathy:     Cervical: No cervical adenopathy.  Skin:    General: Skin is warm and dry.     Findings: No erythema or rash.  Neurological:     Mental Status: He is alert and oriented to person, place, and time.     Comments: Distal sensation intact to light touch all extremities  Psychiatric:        Behavior: Behavior normal.     Comments: Well groomed, good eye contact, normal speech and thoughts    Results for orders placed or performed in visit on 06/29/19  PSA  Result Value Ref Range   PSA 0.3 < OR = 4.0 ng/mL  Lipid panel  Result Value Ref Range   Cholesterol 136 <200 mg/dL   HDL 41 > OR = 40 mg/dL   Triglycerides 82 <150 mg/dL   LDL Cholesterol (Calc) 79 mg/dL (calc)   Total CHOL/HDL Ratio 3.3 <5.0 (calc)   Non-HDL Cholesterol (Calc) 95 <130 mg/dL (calc)  COMPLETE METABOLIC PANEL WITH GFR  Result Value Ref Range   Glucose, Bld 99 65 - 99 mg/dL   BUN 16 7 - 25 mg/dL   Creat 0.83 0.70 - 1.11 mg/dL   GFR, Est Non African American 79 > OR = 60 mL/min/1.70m2   GFR, Est African American 92 > OR = 60 mL/min/1.45m2   BUN/Creatinine Ratio NOT APPLICABLE 6 - 22 (calc)   Sodium 138 135 - 146 mmol/L   Potassium 4.2 3.5 - 5.3 mmol/L   Chloride 105 98 - 110 mmol/L   CO2 28 20 - 32 mmol/L   Calcium 9.2 8.6 - 10.3 mg/dL   Total Protein  6.3 6.1 - 8.1 g/dL   Albumin 3.7 3.6 - 5.1 g/dL   Globulin 2.6 1.9 - 3.7 g/dL (calc)  AG Ratio 1.4 1.0 - 2.5 (calc)   Total Bilirubin 1.1 0.2 - 1.2 mg/dL   Alkaline phosphatase (APISO) 45 35 - 144 U/L   AST 21 10 - 35 U/L   ALT 14 9 - 46 U/L  CBC with Differential/Platelet  Result Value Ref Range   WBC 6.4 3.8 - 10.8 Thousand/uL   RBC 3.86 (L) 4.20 - 5.80 Million/uL   Hemoglobin 12.5 (L) 13.2 - 17.1 g/dL   HCT 38.7 38 - 50 %   MCV 100.3 (H) 80.0 - 100.0 fL   MCH 32.4 27.0 - 33.0 pg   MCHC 32.3 32.0 - 36.0 g/dL   RDW 17.9 (H) 11.0 - 15.0 %   Platelets 177 140 - 400 Thousand/uL   MPV 12.7 (H) 7.5 - 12.5 fL   Neutro Abs 4,339 1,500 - 7,800 cells/uL   Lymphs Abs 1,312 850 - 3,900 cells/uL   Absolute Monocytes 512 200 - 950 cells/uL   Eosinophils Absolute 198 15 - 500 cells/uL   Basophils Absolute 38 0 - 200 cells/uL   Neutrophils Relative % 67.8 %   Total Lymphocyte 20.5 %   Monocytes Relative 8.0 %   Eosinophils Relative 3.1 %   Basophils Relative 0.6 %  Hemoglobin A1c  Result Value Ref Range   Hgb A1c MFr Bld 5.0 <5.7 % of total Hgb   Mean Plasma Glucose 97 (calc)   eAG (mmol/L) 5.4 (calc)   I have personally reviewed the radiology report from 04/2017 on CT Chest LDCT.   CLINICAL DATA:  86 year old male with cough and shortness of breath. Former smoker, but quit many years ago. Patchy lung base opacity on chest radiographs in October.  EXAM: CT CHEST WITH CONTRAST  TECHNIQUE: Multidetector CT imaging of the chest was performed during intravenous contrast administration.  CONTRAST:  64mL ISOVUE-300 IOPAMIDOL (ISOVUE-300) INJECTION 61%  COMPARISON:  Chest radiographs 02/12/2017  FINDINGS: Cardiovascular: Calcified coronary artery atherosclerosis. Soft and calcified plaque in the thoracic aorta, at the arch, and at the proximal great vessels. This includes estimated 60-70% stenosis at the left subclavian artery origin (series 4, image 41). There is more  substantial appearing soft and calcified plaque in the visible proximal abdominal aorta at the renal artery levels (series 4, image 166).  Other major mediastinal vasculature is enhancing and appears patent. Mild cardiomegaly. No pericardial effusion.  Mediastinum/Nodes: Mediastinal and hilar lymph nodes are within normal limits. No mediastinal mass.  Lungs/Pleura: Bilateral subpleural emphysema and subpleural increased reticular opacity with some areas of mild architectural distortion. Subpleural opacity is maximal in the lower lobes and costophrenic angles. The major airways are patent with mild central peribronchial thickening. Superimposed mild gas trapping at the lung bases including the left lingula lateral costophrenic angle (series 3, image 103). No consolidation. No definite pleural effusion.  Upper Abdomen: Rim calcified gallbladder versus an oval 3.3 centimeter gallstone (series 6, image 65). No intra-or extrahepatic biliary ductal enlargement. No pericholecystic inflammation. Negative liver otherwise. Visible spleen, pancreas, and adrenal glands are within normal limits. Negative bowel in the upper abdomen aside from diverticulosis at the visible splenic flexure. Simple fluid density cyst in the lateral right renal midpole. Smaller low-density areas in the bilateral upper poles likely are additional benign cysts.  Musculoskeletal: No acute osseous abnormality identified. No suspicious osseous lesion.  IMPRESSION: 1. Chronic lung disease with paraseptal Emphysema (ICD10-J43.9) and possible developing pulmonary fibrosis. No superimposed acute pulmonary finding identified. 2. Coronary artery and Aortic Atherosclerosis (ICD10-I70.0). Left subclavian artery atherosclerotic stenosis estimated  at 60-70%. Partially visible upper abdominal aorta atherosclerosis. 3. 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 No CT evidence of acute cholecystitis or biliary  obstruction.   Electronically Signed   By: Genevie Ann M.D.   On: 05/19/2017 16:30    Assessment & Plan:   Problem List Items Addressed This Visit    Seasonal allergies   Relevant Medications   fluticasone (FLONASE) 50 MCG/ACT nasal spray   loratadine (CLARITIN) 10 MG tablet   montelukast (SINGULAIR) 10 MG tablet   Paraseptal emphysema (HCC) - Primary    Followed by [facility] Prior CT imaging reviewed Failed Symbicort  Now on Breztri per pulm, seems unsure if this is helpful still. Advised him to continue current treatment plan.  He has upcoming PFTs scheduled.  F/u with Pulm as planned. We will treat allergy/sinus more now      Relevant Medications   fluticasone (FLONASE) 50 MCG/ACT nasal spray   loratadine (CLARITIN) 10 MG tablet   montelukast (SINGULAIR) 10 MG tablet   Chronic heart failure with preserved ejection fraction (HCC)    Clinically stable, euvolemic, with preserved EF on last ECHO 08/2017 Cardiomyopathy, with PVD, PAD Followed by [facility]  He has declined angiography and catheterization. Also has self discontinued Rosuvastatin, Metoprolol.  Has upcoming repeat ECHO.      Allergic rhinitis due to allergen    Chronic problem Likely contributing to some of his symptoms with persistent drainage of sinuses into back of throat and chest. Seems worse if lay down.  He has stopped FLonase, Claritin  Re order / restart Flonase, Loratadine, and add new Singulair 10mg  nightly      Relevant Medications   fluticasone (FLONASE) 50 MCG/ACT nasal spray   loratadine (CLARITIN) 10 MG tablet   montelukast (SINGULAIR) 10 MG tablet       Meds ordered this encounter  Medications  . fluticasone (FLONASE) 50 MCG/ACT nasal spray    Sig: Place 2 sprays into both nostrils daily. Use for 4-6 weeks then stop and use seasonally or as needed.    Dispense:  16 g    Refill:  5  . loratadine (CLARITIN) 10 MG tablet    Sig: Take 1 tablet (10 mg total) by mouth  daily.    Dispense:  90 tablet    Refill:  3  . montelukast (SINGULAIR) 10 MG tablet    Sig: Take 1 tablet (10 mg total) by mouth at bedtime.    Dispense:  90 tablet    Refill:  3      Follow up plan: Return in about 4 months (around 03/11/2020) for 4 month follow-up Emphysema / Pulm / Allergies.   Nobie Putnam, [facility] 11/09/2019, 1:33 PM

## 2019-11-10 ENCOUNTER — Other Ambulatory Visit
Admission: RE | Admit: 2019-11-10 | Discharge: 2019-11-10 | Disposition: A | Discharge status: Home / Self Care | Payer: Medicare Other | Source: Ambulatory Visit | Attending: Pulmonary Disease | Admitting: Pulmonary Disease

## 2019-11-10 DIAGNOSIS — Z20822 Contact with and (suspected) exposure to covid-19: Secondary | ICD-10-CM | POA: Insufficient documentation

## 2019-11-10 DIAGNOSIS — Z01812 Encounter for preprocedural laboratory examination: Secondary | ICD-10-CM | POA: Insufficient documentation

## 2019-11-10 NOTE — Assessment & Plan Note (Signed)
Followed by [facility] Prior CT imaging reviewed Failed Symbicort  Now on Breztri per pulm, seems unsure if this is helpful still. Advised him to continue current treatment plan.  He has upcoming PFTs scheduled.  F/u with Pulm as planned. We will treat allergy/sinus more now

## 2019-11-10 NOTE — Assessment & Plan Note (Signed)
Chronic problem Likely contributing to some of his symptoms with persistent drainage of sinuses into back of throat and chest. Seems worse if lay down.  He has stopped FLonase, Claritin  Re order / restart Flonase, Loratadine, and add new Singulair 10mg  nightly

## 2019-11-10 NOTE — Assessment & Plan Note (Signed)
Clinically stable, euvolemic, with preserved EF on last ECHO 08/2017 Cardiomyopathy, with PVD, PAD Followed by [facility]  He has declined angiography and catheterization. Also has self discontinued Rosuvastatin, Metoprolol.  Has upcoming repeat ECHO.

## 2019-11-11 ENCOUNTER — Other Ambulatory Visit: Payer: Self-pay

## 2019-11-11 ENCOUNTER — Ambulatory Visit
Admission: RE | Admit: 2019-11-11 | Discharge: 2019-11-11 | Disposition: A | Discharge status: Home / Self Care | Payer: Medicare Other | Source: Ambulatory Visit | Attending: Pulmonary Disease | Admitting: Pulmonary Disease

## 2019-11-11 ENCOUNTER — Ambulatory Visit (HOSPITAL_COMMUNITY): Payer: Medicare Other

## 2019-11-11 DIAGNOSIS — R0602 Shortness of breath: Secondary | ICD-10-CM | POA: Diagnosis not present

## 2019-11-11 DIAGNOSIS — J209 Acute bronchitis, unspecified: Secondary | ICD-10-CM | POA: Diagnosis not present

## 2019-11-11 DIAGNOSIS — I10 Essential (primary) hypertension: Secondary | ICD-10-CM | POA: Diagnosis not present

## 2019-11-11 DIAGNOSIS — J440 Chronic obstructive pulmonary disease with acute lower respiratory infection: Secondary | ICD-10-CM | POA: Diagnosis not present

## 2019-11-11 DIAGNOSIS — I340 Nonrheumatic mitral (valve) insufficiency: Secondary | ICD-10-CM | POA: Insufficient documentation

## 2019-11-11 LAB — PULMONARY FUNCTION TEST ARMC ONLY
DL/VA % pred: 69 %
DL/VA: 2.6 ml/min/mmHg/L
DLCO unc % pred: 42 %
DLCO unc: 10.96 ml/min/mmHg
FEF 25-75 Post: 3.51 L/sec
FEF 25-75 Pre: 2.46 L/sec
FEF2575-%Change-Post: 42 %
FEF2575-%Pred-Post: 188 %
FEF2575-%Pred-Pre: 132 %
FEV1-%Change-Post: 14 %
FEV1-%Pred-Post: 58 %
FEV1-%Pred-Pre: 50 %
FEV1-Post: 1.71 L
FEV1-Pre: 1.49 L
FEV1FVC-%Change-Post: -6 %
FEV1FVC-%Pred-Pre: 126 %
FEV6-%Change-Post: 32 %
FEV6-%Pred-Post: 52 %
FEV6-%Pred-Pre: 39 %
FEV6-Post: 2.06 L
FEV6-Pre: 1.55 L
FEV6FVC-%Pred-Post: 107 %
FEV6FVC-%Pred-Pre: 107 %
FVC-%Change-Post: 23 %
FVC-%Pred-Post: 49 %
FVC-%Pred-Pre: 40 %
FVC-Post: 2.07 L
Post FEV1/FVC ratio: 83 %
Post FEV6/FVC ratio: 100 %
Pre FEV1/FVC ratio: 88 %
Pre FEV6/FVC Ratio: 100 %
RV % pred: 40 %
RV: 1.2 L
TLC % pred: 43 %
TLC: 3.31 L

## 2019-11-11 LAB — ECHOCARDIOGRAM COMPLETE
AR max vel: 3.05 cm2
AV Area VTI: 3.32 cm2
AV Area mean vel: 3.1 cm2
AV Mean grad: 2 mmHg
AV Peak grad: 4.4 mmHg
Ao pk vel: 1.05 m/s
Area-P 1/2: 4.71 cm2
P 1/2 time: 395 msec
S' Lateral: 3.08 cm

## 2019-11-11 LAB — SARS CORONAVIRUS 2 (TAT 6-24 HRS): SARS Coronavirus 2: NEGATIVE

## 2019-11-11 MED ORDER — ALBUTEROL SULFATE (2.5 MG/3ML) 0.083% IN NEBU
2.5000 mg | INHALATION_SOLUTION | Freq: Once | RESPIRATORY_TRACT | Status: AC
  Administered 2019-11-11: 2.5 mg via RESPIRATORY_TRACT
  Filled 2019-11-11: qty 3

## 2019-11-11 NOTE — Progress Notes (Signed)
*  PRELIMINARY RESULTS* Echocardiogram 2D Echocardiogram has been performed.  Sherrie Sport 11/11/2019, 10:34 AM

## 2019-11-16 ENCOUNTER — Other Ambulatory Visit: Payer: Self-pay | Admitting: Family Medicine

## 2019-11-16 MED ORDER — NAPROXEN 500 MG PO TABS: 500.0000 mg | ORAL_TABLET | Freq: Two times a day (BID) | ORAL | 0 refills | Status: DC | PRN

## 2019-11-16 NOTE — Telephone Encounter (Signed)
Medication Refill - Medication: naproxen (NAPROSYN) 500 MG tablet    Has the patient contacted their pharmacy? Yes.   (Agent: If no, request that the patient contact the pharmacy for the refill.) (Agent: If yes, when and what did the pharmacy advise?)  Preferred Pharmacy (with phone number or street name):  [id number] [address], [address] - [address]  [place] [address]  Phone: [phone] Fax: [phone]     Agent: Please be advised that RX refills may take up to 3 business days. We ask that you follow-up with your pharmacy.

## 2019-11-16 NOTE — Telephone Encounter (Signed)
Requested medication (s) are due for refill today: n/a  Requested medication (s) are on the active medication list: Yes  Last refill:  11/09/19  Future visit scheduled: Yes  Notes to clinic:  Historical provider.    Requested Prescriptions  Pending Prescriptions Disp Refills   naproxen (NAPROSYN) 500 MG tablet 90 tablet 0    Sig: Take 1 tablet (500 mg total) by mouth 2 (two) times daily as needed.      Analgesics:  NSAIDS Failed - 11/16/2019 11:30 AM      Failed - HGB in normal range and within 360 days    Hemoglobin  Date Value Ref Range Status  06/29/2019 12.5 (L) 13.2 - 17.1 g/dL Final  02/16/2015 14.1 12.6 - 17.7 g/dL Final          Passed - Cr in normal range and within 360 days    Creat  Date Value Ref Range Status  06/29/2019 0.83 0.70 - 1.11 mg/dL Final    Comment:    For patients >95 years of age, the reference limit for Creatinine is approximately 13% higher for people identified as African-American. Renella Cunas - Patient is not pregnant      Passed - Valid encounter within last 12 months    Recent Outpatient Visits           1 week ago Paraseptal emphysema ([place])   [facility], [name], DO   4 months ago Paraseptal emphysema [facility])   [place], DO   5 months ago Acute rhinosinusitis   [place], DO   1 year ago DDD (degenerative disc disease), lumbar   [place], DO   1 year ago Seasonal allergic rhinitis due to other allergic trigger   [place], DO       Future Appointments             In 1 week Tyler Pita, MD [place]   In 3 months [place], [name], DO [facility], [facility]

## 2019-11-25 ENCOUNTER — Other Ambulatory Visit
Admission: RE | Admit: 2019-11-25 | Discharge: 2019-11-25 | Disposition: A | Discharge status: Home / Self Care | Payer: Medicare Other | Attending: Pulmonary Disease | Admitting: Pulmonary Disease

## 2019-11-25 ENCOUNTER — Other Ambulatory Visit: Payer: Self-pay

## 2019-11-25 ENCOUNTER — Ambulatory Visit: Payer: Medicare Other | Admitting: Pulmonary Disease

## 2019-11-25 ENCOUNTER — Encounter: Payer: Self-pay | Admitting: Pulmonary Disease

## 2019-11-25 VITALS — BP 132/72 | HR 87 | Temp 98.1°F | Ht 70.0 in | Wt 207.2 lb

## 2019-11-25 DIAGNOSIS — R0609 Other forms of dyspnea: Secondary | ICD-10-CM

## 2019-11-25 DIAGNOSIS — I340 Nonrheumatic mitral (valve) insufficiency: Secondary | ICD-10-CM

## 2019-11-25 DIAGNOSIS — R5383 Other fatigue: Secondary | ICD-10-CM

## 2019-11-25 DIAGNOSIS — R0602 Shortness of breath: Secondary | ICD-10-CM | POA: Insufficient documentation

## 2019-11-25 DIAGNOSIS — R0600 Dyspnea, unspecified: Secondary | ICD-10-CM

## 2019-11-25 DIAGNOSIS — I5189 Other ill-defined heart diseases: Secondary | ICD-10-CM

## 2019-11-25 DIAGNOSIS — I272 Pulmonary hypertension, unspecified: Secondary | ICD-10-CM

## 2019-11-25 LAB — RENAL FUNCTION PANEL
Albumin: 3.6 g/dL (ref 3.5–5.0)
Anion gap: 6 (ref 5–15)
BUN: 14 mg/dL (ref 8–23)
CO2: 26 mmol/L (ref 22–32)
Calcium: 9 mg/dL (ref 8.9–10.3)
Chloride: 101 mmol/L (ref 98–111)
Creatinine, Ser: 0.87 mg/dL (ref 0.61–1.24)
GFR calc Af Amer: >60 mL/min (ref >60)
GFR calc non Af Amer: >60 mL/min (ref >60)
Glucose, Bld: 92 mg/dL (ref 70–99)
Phosphorus: 3.3 mg/dL (ref 2.5–4.6)
Potassium: 4.5 mmol/L (ref 3.5–5.1)
Sodium: 133 mmol/L — ABNORMAL LOW (ref 135–145)

## 2019-11-25 LAB — CBC WITH DIFFERENTIAL/PLATELET
Abs Immature Granulocytes: 0.06 10*3/uL (ref 0.00–0.07)
Basophils Absolute: 0 10*3/uL (ref 0.0–0.1)
Basophils Relative: 0 %
Eosinophils Absolute: 0.3 10*3/uL (ref 0.0–0.5)
Eosinophils Relative: 5 %
HCT: 32.8 % — ABNORMAL LOW (ref 39.0–52.0)
Hemoglobin: 10.9 g/dL — ABNORMAL LOW (ref 13.0–17.0)
Immature Granulocytes: 1 %
Lymphocytes Relative: 25 %
Lymphs Abs: 1.5 10*3/uL (ref 0.7–4.0)
MCH: 32.8 pg (ref 26.0–34.0)
MCHC: 33.2 g/dL (ref 30.0–36.0)
MCV: 98.8 fL (ref 80.0–100.0)
Monocytes Absolute: 0.6 10*3/uL (ref 0.1–1.0)
Monocytes Relative: 9 %
Neutro Abs: 3.7 10*3/uL (ref 1.7–7.7)
Neutrophils Relative %: 60 %
Platelets: 135 10*3/uL — ABNORMAL LOW (ref 150–400)
RBC: 3.32 MIL/uL — ABNORMAL LOW (ref 4.22–5.81)
RDW: 21.2 % — ABNORMAL HIGH (ref 11.5–15.5)
Smear Review: NORMAL
WBC: 6.1 10*3/uL (ref 4.0–10.5)
nRBC: 0.7 % — ABNORMAL HIGH (ref 0.0–0.2)

## 2019-11-25 LAB — TSH: TSH: 3.172 u[IU]/mL (ref 0.350–4.500)

## 2019-11-25 MED ORDER — BREZTRI AEROSPHERE 160-9-4.8 MCG/ACT IN AERO: 2.0000 | INHALATION_SPRAY | Freq: Every day | RESPIRATORY_TRACT | 0 refills | Status: AC

## 2019-11-25 MED ORDER — BREZTRI AEROSPHERE 160-9-4.8 MCG/ACT IN AERO: 2.0000 | INHALATION_SPRAY | Freq: Two times a day (BID) | RESPIRATORY_TRACT | 6 refills | Status: DC

## 2019-11-25 NOTE — Patient Instructions (Signed)
Appointment with [name].  There may be studies that need to be done to evaluate your leaky heart valve, the stiffness of your heart and the increased pressure on the artery going from the heart to the lungs.  Continue using Breztri 2 puffs twice a day, you have been given samples today as well as a prescription was sent to your pharmacy.  Continue using oxygen at 2 L/min at nighttime.  This is very important as you do have increased pressure on the artery going from the heart to the lungs.  The oxygen will help your heart relapse.  We are going to do some blood work today to make sure that your blood counts are good as sometimes low blood counts can cause shortness of breath.   We will see you in follow-up in 2 months time call sooner should any new problems arise.

## 2019-11-25 NOTE — Progress Notes (Signed)
 Assessment & Plan:  1. Dyspnea on exertion (Primary)  2. Other fatigue - TSH; Future  3. Diastolic dysfunction  4. Mitral valve insufficiency, unspecified etiology  5. Pulmonary HTN (HCC)  6. SOB (shortness of breath) - CBC w/Diff; Future - Renal Function Panel; Future - TSH; Future   Patient Instructions  Appointment with Dr. Mady.  There may be studies that need to be done to evaluate your leaky heart valve, the stiffness of your heart and the increased pressure on the artery going from the heart to the lungs.  Continue using Breztri  2 puffs twice a day, you have been given samples today as well as a prescription was sent to your pharmacy.  Continue using oxygen  at 2 L/min at nighttime.  This is very important as you do have increased pressure on the artery going from the heart to the lungs.  The oxygen  will help your heart relapse.  We are going to do some blood work today to make sure that your blood counts are good as sometimes low blood counts can cause shortness of breath.   We will see you in follow-up in 2 months time call sooner should any new problems arise.  Please note: late entry documentation due to logistical difficulties during COVID-19 pandemic. This note is filed for information purposes only, and is not intended to be used for billing, nor does it represent the full scope/nature of the visit in question. Please see any associated scanned media linked to date of encounter for additional pertinent information.  Subjective:    HPI: Raymond Barnett is a 86 y.o. male presenting to the pulmonology clinic on 11/25/2019 with report of: Follow-up (pt states breathing is baseline. sob with exertion. )     Outpatient Encounter Medications as of 11/25/2019  Medication Sig   ASPIRIN  LOW DOSE 81 MG chewable tablet Chew 81 mg by mouth daily.   finasteride  (PROSCAR ) 5 MG tablet Take 5 mg by mouth daily.   fluticasone  (FLONASE ) 50 MCG/ACT nasal spray Place 2 sprays  into both nostrils daily. Use for 4-6 weeks then stop and use seasonally or as needed. (Patient taking differently: Place 2 sprays into both nostrils as directed. Use for 4-6 weeks then stop and use seasonally or as needed.)   LUBRICATING PLUS EYE DROPS 0.5 % SOLN Apply 1 application to eye as needed (dry eyes).    timolol  (TIMOPTIC ) 0.5 % ophthalmic solution Place 1 drop into both eyes 2 (two) times daily.    [DISCONTINUED] budesonide-formoterol  (SYMBICORT) 160-4.5 MCG/ACT inhaler Inhale 2 puffs into the lungs 2 (two) times daily.  (Patient not taking: Reported on 12/29/2019)   [DISCONTINUED] Calcium  Carb-Cholecalciferol  (CALCIUM  600 + D) 600-200 MG-UNIT TABS Take 1 tablet by mouth daily. Patient takes 1000 mg daily.   [DISCONTINUED] cholecalciferol  (VITAMIN D ) 25 MCG (1000 UNIT) tablet  (Patient not taking: Reported on 12/08/2019)   [DISCONTINUED] Lidocaine -Hydrocortisone  Ace 2-2 % KIT Use rectal suppository twice daily as needed for up to 1 week for hemorrhoids   [DISCONTINUED] lisinopril  (ZESTRIL ) 5 MG tablet Take 5 mg by mouth daily. (Patient not taking: Reported on 03/02/2020)   [DISCONTINUED] loratadine  (CLARITIN ) 10 MG tablet Take 1 tablet (10 mg total) by mouth daily.   [DISCONTINUED] losartan  (COZAAR ) 25 MG tablet Take 1 tablet (25 mg total) by mouth daily.   [DISCONTINUED] montelukast  (SINGULAIR ) 10 MG tablet Take 1 tablet (10 mg total) by mouth at bedtime. (Patient not taking: Reported on 12/08/2019)   [DISCONTINUED] naproxen  (NAPROSYN ) 500  MG tablet Take 1 tablet (500 mg total) by mouth 2 (two) times daily as needed.   [EXPIRED] Budeson-Glycopyrrol-Formoterol  (BREZTRI  AEROSPHERE) 160-9-4.8 MCG/ACT AERO Inhale 2 puffs into the lungs daily for 1 day.   [DISCONTINUED] Budeson-Glycopyrrol-Formoterol  (BREZTRI  AEROSPHERE) 160-9-4.8 MCG/ACT AERO Inhale 2 puffs into the lungs 2 (two) times daily. (Patient not taking: Reported on 03/03/2020)   No facility-administered encounter medications on file as  of 11/25/2019.      Objective:   Vitals:   11/25/19 1436  BP: 132/72  Pulse: 87  Temp: 98.1 F (36.7 C)  Height: 5' 10 (1.778 m)  Weight: 207 lb 3.2 oz (94 kg)  SpO2: 96%  TempSrc: Temporal  BMI (Calculated): 29.73     Physical exam documentation is limited by delayed entry of information.

## 2019-11-26 NOTE — Progress Notes (Signed)
Patient returned our call, provided results per Dr. Marella Bile.  He verbalized understanding.  Nothing further needed.

## 2019-11-29 ENCOUNTER — Telehealth: Payer: Self-pay | Admitting: Family Medicine

## 2019-11-29 DIAGNOSIS — D539 Nutritional anemia, unspecified: Secondary | ICD-10-CM

## 2019-11-29 NOTE — Telephone Encounter (Signed)
Received notification from patient's Pulmonologist from recent labs on 11/25/19 showed Hemoglobin reduced from 12.5 down to 10.9. over past 5-6 months.  Also has reduced HCT 32.8  His MCV  Has been elevated before around 98-100, consistent with a macrocytic anemia.  Please notify patient that based on his recent lab results with Anemia, his Pulmonologist would like him to return to see me to discuss Anemia and we need additional blood work.  Orders are in for: - CBC (repeat) - Anemia Panel (ferritin, TIBC, Tsat) - Vitamin B12 - Folate  He can get the blood done next week and we can see him within few days to 1 week of his blood draw.  He just had blood on 11/25/19, therefore, I would wait at least 1 more week before we draw his blood again.  Nobie Putnam, [facility] 11/29/2019, 6:00 PM

## 2019-11-30 ENCOUNTER — Other Ambulatory Visit: Payer: Self-pay | Admitting: Family Medicine

## 2019-11-30 DIAGNOSIS — D539 Nutritional anemia, unspecified: Secondary | ICD-10-CM

## 2019-11-30 NOTE — Telephone Encounter (Signed)
Thank you :)

## 2019-12-06 ENCOUNTER — Other Ambulatory Visit: Payer: Medicare Other

## 2019-12-06 ENCOUNTER — Other Ambulatory Visit: Payer: Self-pay

## 2019-12-06 DIAGNOSIS — D539 Nutritional anemia, unspecified: Secondary | ICD-10-CM | POA: Diagnosis not present

## 2019-12-06 LAB — CBC WITH DIFFERENTIAL/PLATELET
Absolute Monocytes: 616 cells/uL (ref 200–950)
Basophils Absolute: 30 cells/uL (ref 0–200)
Basophils Relative: 0.4 %
Eosinophils Absolute: 190 cells/uL (ref 15–500)
Eosinophils Relative: 2.5 %
HCT: 34.8 % — ABNORMAL LOW (ref 38.5–50.0)
Hemoglobin: 11.3 g/dL — ABNORMAL LOW (ref 13.2–17.1)
Lymphs Abs: 1254 cells/uL (ref 850–3900)
MCH: 32.6 pg (ref 27.0–33.0)
MCHC: 32.5 g/dL (ref 32.0–36.0)
MCV: 100.3 fL — ABNORMAL HIGH (ref 80.0–100.0)
MPV: 12.8 fL — ABNORMAL HIGH (ref 7.5–12.5)
Monocytes Relative: 8.1 %
Neutro Abs: 5510 cells/uL (ref 1500–7800)
Neutrophils Relative %: 72.5 %
Platelets: 172 10*3/uL (ref 140–400)
RBC: 3.47 10*6/uL — ABNORMAL LOW (ref 4.20–5.80)
RDW: 19.5 % — ABNORMAL HIGH (ref 11.0–15.0)
Total Lymphocyte: 16.5 %
WBC: 7.6 10*3/uL (ref 3.8–10.8)

## 2019-12-06 LAB — IRON,TIBC AND FERRITIN PANEL
%SAT: 44 % (calc) (ref 20–48)
Ferritin: 794 ng/mL — ABNORMAL HIGH (ref 24–380)
Iron: 106 ug/dL (ref 50–180)
TIBC: 239 mcg/dL (calc) — ABNORMAL LOW (ref 250–425)

## 2019-12-06 LAB — VITAMIN B12: Vitamin B-12: 532 pg/mL (ref 200–1100)

## 2019-12-06 LAB — FOLATE: Folate: 11.4 ng/mL

## 2019-12-07 ENCOUNTER — Other Ambulatory Visit: Payer: Medicare Other

## 2019-12-07 NOTE — Progress Notes (Signed)
Noted  

## 2019-12-08 ENCOUNTER — Encounter: Payer: Self-pay | Admitting: Family Medicine

## 2019-12-08 ENCOUNTER — Ambulatory Visit (INDEPENDENT_AMBULATORY_CARE_PROVIDER_SITE_OTHER): Payer: Medicare Other | Admitting: Family Medicine

## 2019-12-08 ENCOUNTER — Other Ambulatory Visit: Payer: Self-pay

## 2019-12-08 VITALS — BP 118/53 | HR 75 | Temp 97.9°F | Resp 18 | Ht 70.0 in | Wt 202.8 lb

## 2019-12-08 DIAGNOSIS — D638 Anemia in other chronic diseases classified elsewhere: Secondary | ICD-10-CM | POA: Diagnosis not present

## 2019-12-08 NOTE — Progress Notes (Signed)
Subjective:    Patient ID: Raymond Barnett, male    DOB: October 16, 1929, 86 y.o.   MRN: [id number]  Raymond Barnett is a 86 y.o. male presenting on 12/08/2019 for Shortness of Breath (x 2 weeks.Symptoms worsen when walking short distance. Pt seen Pulmonologist x 13 days. He was told to f/u with Dr. Raliegh Ip to address the anemia.)   HPI   Paraseptal Emphysema/ Chronic Dyspnea Seasonal Environmental Allergies / Allergic Rhinitis Peripheral Arterial Disease PAD / Heart Failure with preserved ejection fracture NYHA III  - Interval update with has been following [facility] previously Dr Mortimer Fries, now Dr Patsey Berthold, see office notes, has had treatment for LUL PNA in past. Has had 6 min walk test and also ECHO with diasystolic dysfunction and mod pulm HTN, also has had X-ray in past, no PFTs completed, has had overnight oximetry test with showed needed 2 L O2, pulmonology thought his dyspnea is multifactorial, thought to be COPD / Interstitial lung disease, additionally CHF in past showed mod pulm HTN diastolic dysfunction that can contribute, also ACEi cough in past has been switched. She has changed his med from Symbicort to [place] as well and future may need high res CT.  He has upcoming PFTs and upcoming ECHO repeat.  Some mid improvement with his dyspnea now in the past few weeks.  Previously followed by Dr Tish Men HemeOnc - History thrombocytopenia  Labs recently showed Anemia, we did repeat lab panel with Anemia panel, see results, showed normal iron reserves, and B12 and elevated Ferritin.  He is former smoker  He does not noticed any significant improvement on Breztri but still trying to use it.  Additional concern with joint pain knees, he asks to resume his naproxen he has the medicine at home.  - From prior visit with Cardiology - he has declined these medications, Remains off Rosuvastatin, Metoprolol Denies chest pain, dyspnea, edema, claudication, or fever chills productive  cough chest pain   He had side effects on Montelukast, had insomnia, stopped this Taking Melatonin 5mg  nightly with good results  Health Maintenance: Due Flu vaccine, not available yet.  Depression screen [facility] 2/9 11/09/2019 07/06/2019 06/08/2019  Decreased Interest 0 0 0  Down, Depressed, Hopeless 0 0 0  PHQ - 2 Score 0 0 0  Altered sleeping - - -  Tired, decreased energy - - -  Change in appetite - - -  Feeling bad or failure about yourself  - - -  Trouble concentrating - - -  Moving slowly or fidgety/restless - - -  Suicidal thoughts - - -  PHQ-9 Score - - -  Difficult doing work/chores - - -    Social History   Tobacco Use  . Smoking status: Former Smoker    Packs/day: 1.00    Years: 30.00    Pack years: 30.00    Types: Cigarettes    Quit date: 1995    Years since quitting: 26.6  . Smokeless tobacco: Former Network engineer  . Vaping Use: Never used  Substance Use Topics  . Alcohol use: Not Currently    Comment: occasionally wine   . Drug use: No    Review of Systems Per HPI unless specifically indicated above     Objective:    BP (!) 118/53 (BP Location: Right Arm, Patient Position: Sitting, Cuff Size: Normal)   Pulse 75   Temp 97.9 F (36.6 C) (Oral)   Resp 18   Ht 5\' 10"  (1.778 m)  Wt 202 lb 12.8 oz (92 kg)   SpO2 99%   BMI 29.10 kg/m   Wt Readings from Last 3 Encounters:  12/08/19 202 lb 12.8 oz (92 kg)  11/25/19 207 lb 3.2 oz (94 kg)  11/09/19 201 lb 9.6 oz (91.4 kg)    Physical Exam Vitals and nursing note reviewed.  Constitutional:      General: He is not in acute distress.    Appearance: He is well-developed. He is not diaphoretic.     Comments: Well-appearing, comfortable, cooperative  HENT:     Head: Normocephalic and atraumatic.  Eyes:     General:        Right eye: No discharge.        Left eye: No discharge.     Conjunctiva/sclera: Conjunctivae normal.  Neck:     Thyroid: No thyromegaly.  Cardiovascular:     Rate and  Rhythm: Normal rate and regular rhythm.     Heart sounds: Normal heart sounds. No murmur heard.   Pulmonary:     Effort: Pulmonary effort is normal. No respiratory distress.     Breath sounds: Normal breath sounds. No wheezing or rales.  Musculoskeletal:        General: Normal range of motion.     Cervical back: Normal range of motion and neck supple.  Lymphadenopathy:     Cervical: No cervical adenopathy.  Skin:    General: Skin is warm and dry.     Findings: No erythema or rash.  Neurological:     Mental Status: He is alert and oriented to person, place, and time.  Psychiatric:        Behavior: Behavior normal.     Comments: Well groomed, good eye contact, normal speech and thoughts    Results for orders placed or performed in visit on 11/30/19  Folate  Result Value Ref Range   Folate 11.4 ng/mL  Vitamin B12  Result Value Ref Range   Vitamin B-12 532 200 - 1,100 pg/mL  Iron, TIBC and Ferritin Panel  Result Value Ref Range   Iron 106 50 - 180 mcg/dL   TIBC 239 (L) 250 - 425 mcg/dL (calc)   %SAT 44 20 - 48 % (calc)   Ferritin 794 (H) 24 - 380 ng/mL  CBC with Differential/Platelet  Result Value Ref Range   WBC 7.6 3.8 - 10.8 Thousand/uL   RBC 3.47 (L) 4.20 - 5.80 Million/uL   Hemoglobin 11.3 (L) 13.2 - 17.1 g/dL   HCT 34.8 (L) 38 - 50 %   MCV 100.3 (H) 80.0 - 100.0 fL   MCH 32.6 27.0 - 33.0 pg   MCHC 32.5 32.0 - 36.0 g/dL   RDW 19.5 (H) 11.0 - 15.0 %   Platelets 172 140 - 400 Thousand/uL   MPV 12.8 (H) 7.5 - 12.5 fL   Neutro Abs 5,510 1,500 - 7,800 cells/uL   Lymphs Abs 1,254 850 - 3,900 cells/uL   Absolute Monocytes 616 200 - 950 cells/uL   Eosinophils Absolute 190 15 - 500 cells/uL   Basophils Absolute 30 0 - 200 cells/uL   Neutrophils Relative % 72.5 %   Total Lymphocyte 16.5 %   Monocytes Relative 8.1 %   Eosinophils Relative 2.5 %   Basophils Relative 0.4 %      Assessment & Plan:   Problem List Items Addressed This Visit    None    Visit Diagnoses      Anemia of chronic disease    -  Primary   Relevant Orders   Ambulatory referral to Hematology      referral to return to Hematology (previous Dr Rogue Bussing) he had been seen for thrombocytopenia, now he has anemia of chronic disease based on labwork recently mild low normal Hgb and has no sign of Vitamin B12 deficiency or Iron deficiency (has elevated Ferritin) but he has persistent chronic functional dyspnea and weakness. He is working with Pulmonology [facility] Dr Ronalee Belts) but they have requested further anemia work up and management - he has been managed on respiratory therapy inhalers maintenance for emphysema and has had cardiac work up.  Requesting evaluation by Hematology to assist with his symptomatic dyspnea / weakness syndrome at this time. Underlying etiology seems likely multi factorial, but may warrant further hematology management.  No orders of the defined types were placed in this encounter.  Orders Placed This Encounter  Procedures  . Ambulatory referral to Hematology    Referral Priority:   Routine    Referral Type:   Consultation    Referral Reason:   Specialty Services Required    Requested Specialty:   Oncology    Number of Visits Requested:   1      Follow up plan: Return in about 3 months (around 03/09/2020) for 3 month follow-up anemia/hematology.    Nobie Putnam, [facility] 12/08/2019, 1:39 PM

## 2019-12-08 NOTE — Patient Instructions (Addendum)
Thank you for coming to the office today.  You may take the Melatonin 5mg  nightly for sleep.   Acacia Villas at [facility] (Hematology/Oncology) [address], [place] [address] Phone: [phone]  Charlaine Dalton, MD  -----------------------------------------------------------  If you are interested in a Power Wheelchair or Scientist, physiological - please look into the cost / coverage of this with your insurance plan / medicare, and find the company you are interested in using. Once you locate them, if you want to proceed, you can leave our nursing staff a message with that company and schedule to be seen in office and we will do the application to try to get it covered by insurance.   Please schedule a Follow-up Appointment to: Return in about 3 months (around 03/09/2020) for 3 month follow-up anemia/hematology.  If you have any other questions or concerns, please feel free to call the office or send a message through [place]. You may also schedule an earlier appointment if necessary.  Additionally, you may be receiving a survey about your experience at our office within a few days to 1 week by e-mail or mail. We value your feedback.  Nobie Putnam, DO [place] [facility]

## 2019-12-13 ENCOUNTER — Telehealth: Payer: Self-pay

## 2019-12-13 DIAGNOSIS — F5101 Primary insomnia: Secondary | ICD-10-CM

## 2019-12-13 MED ORDER — TRAZODONE HCL 50 MG PO TABS: 50.0000 mg | ORAL_TABLET | Freq: Every day | ORAL | 2 refills | Status: DC

## 2019-12-13 NOTE — Telephone Encounter (Signed)
As per patient melatonin is not helping and want something stronger as compare to melatonin send to [facility].

## 2019-12-13 NOTE — Telephone Encounter (Signed)
Copied from [place] [id number]. Topic: General - Inquiry >> Dec 13, 2019 11:00 AM Gillis Ends D wrote: Reason for CRM: Patient called because he has been using melatonin as doctor instructed on 12-08-19 to help him sleep, but it hasn't ben working and the patient has slept in 2 days. He asked that someone return his call and he gets something else for sleep. Please advise the patient.

## 2019-12-13 NOTE — Telephone Encounter (Signed)
Sent Trazodone 50mg  nightly to [facility].  He should follow-up further in future if not improving.  Nobie Putnam, [facility] 12/13/2019, 6:05 PM

## 2019-12-21 ENCOUNTER — Encounter: Payer: Self-pay | Admitting: Internal Medicine

## 2019-12-21 ENCOUNTER — Inpatient Hospital Stay: Payer: Medicare Other

## 2019-12-21 ENCOUNTER — Inpatient Hospital Stay: Payer: Medicare Other | Attending: Internal Medicine | Admitting: Internal Medicine

## 2019-12-21 ENCOUNTER — Other Ambulatory Visit: Payer: Self-pay

## 2019-12-21 DIAGNOSIS — I739 Peripheral vascular disease, unspecified: Secondary | ICD-10-CM | POA: Insufficient documentation

## 2019-12-21 DIAGNOSIS — I5032 Chronic diastolic (congestive) heart failure: Secondary | ICD-10-CM | POA: Insufficient documentation

## 2019-12-21 DIAGNOSIS — Z7982 Long term (current) use of aspirin: Secondary | ICD-10-CM | POA: Diagnosis not present

## 2019-12-21 DIAGNOSIS — Z79899 Other long term (current) drug therapy: Secondary | ICD-10-CM | POA: Insufficient documentation

## 2019-12-21 DIAGNOSIS — I110 Hypertensive heart disease with heart failure: Secondary | ICD-10-CM | POA: Diagnosis not present

## 2019-12-21 DIAGNOSIS — Z87891 Personal history of nicotine dependence: Secondary | ICD-10-CM | POA: Insufficient documentation

## 2019-12-21 DIAGNOSIS — I208 Other forms of angina pectoris: Secondary | ICD-10-CM

## 2019-12-21 DIAGNOSIS — Z7951 Long term (current) use of inhaled steroids: Secondary | ICD-10-CM | POA: Insufficient documentation

## 2019-12-21 DIAGNOSIS — D696 Thrombocytopenia, unspecified: Secondary | ICD-10-CM | POA: Insufficient documentation

## 2019-12-21 DIAGNOSIS — D539 Nutritional anemia, unspecified: Secondary | ICD-10-CM | POA: Insufficient documentation

## 2019-12-21 DIAGNOSIS — Z791 Long term (current) use of non-steroidal anti-inflammatories (NSAID): Secondary | ICD-10-CM | POA: Insufficient documentation

## 2019-12-21 DIAGNOSIS — J449 Chronic obstructive pulmonary disease, unspecified: Secondary | ICD-10-CM | POA: Diagnosis not present

## 2019-12-21 NOTE — Progress Notes (Signed)
Raymond Barnett OFFICE PROGRESS NOTE  Patient Care Team: [name], DO as PCP - General (Family Medicine) End, [name], MD as PCP - Cardiology (Cardiology) Casimer Lanius, DPM (Podiatry) Felipa Eth, MD (Internal Medicine)   SUMMARY OF ONCOLOGIC HISTORY:  # 2006 CHRONIC LOW THROMBOCYTOPENIA- ITP [clinically more likely] vs MDS [ No BMBx]; [place] 2013- Normal spleen; asymptomatic on surveillance  INTERVAL HISTORY:  A very pleasant 86 year-old male patient who looks much younger than his stated age and is here for follow-up.  Patient denies any unusual cough or shortness of breath on exertion.   Chronic mild bruising not any worse.  Review of Systems  Constitutional: Negative for chills, diaphoresis, fever, malaise/fatigue and weight loss.  HENT: Negative for nosebleeds and sore throat.   Eyes: Negative for double vision.  Respiratory: Negative for cough, hemoptysis, sputum production, shortness of breath and wheezing.   Cardiovascular: Negative for chest pain, palpitations, orthopnea and leg swelling.  Gastrointestinal: Negative for abdominal pain, blood in stool, constipation, diarrhea, heartburn, melena, nausea and vomiting.  Genitourinary: Negative for dysuria, frequency and urgency.  Musculoskeletal: Negative for back pain and joint pain.  Skin: Negative.  Negative for itching and rash.  Neurological: Negative for dizziness, tingling, focal weakness, weakness and headaches.  Endo/Heme/Allergies: Bruises/bleeds easily.  Psychiatric/Behavioral: Negative for depression. The patient is not nervous/anxious and does not have insomnia.      PAST MEDICAL HISTORY :  Past Medical History:  Diagnosis Date  . (HFpEF) heart failure with improved ejection fraction ([place])    a. 08/2017 Echo: EF 45-50%; b. 10/2019 Echo: EF 55-60%. No rwma, mild LVH, Gr2 DD, RVSP 47.12mHg. Mod dil LA. Mod MR. Asc Ao 437m  . Arthritis   . Benign prostate hyperplasia     lower urinary tract symptoms  . Cataracts, bilateral   . COPD (chronic obstructive pulmonary disease) ([facility]  . DDD (degenerative disc disease), lumbar   . Glaucoma   . H/O carpal tunnel syndrome   . History of osteomyelitis 1953   right leg  . Hypertension   . Moderate mitral regurgitation    a. 10/2019 Echo: Mod MR.  . Marland KitchenAD (peripheral artery disease) ([facility] [place]   a. 08/2017 LE ABI & Duplex: ABI R 0.60, L 1.07. 30-49% R popliteal and 50-74% R PT stenoses.  . Thrombocytopenia ([place]    PAST SURGICAL HISTORY :   Past Surgical History:  Procedure Laterality Date  . CARPAL TUNNEL RELEASE Left   . CARPAL TUNNEL RELEASE Right   . COLONOSCOPY  2015   Dr. [name]. LEG SURGERY Right 1953  . SKIN GRAFT Right 05/01/2017   [facility] TOTAL KNEE ARTHROPLASTY Right     FAMILY HISTORY :   Family History  Problem Relation Age of Onset  . Colon cancer Mother 5165. Thrombosis Father 6436. Heart disease Father        Heart valve problem  . AAA (abdominal aortic aneurysm) Father   . Hypertension Sister   . Hyperlipidemia Sister   . Hypertension Sister   . Hyperlipidemia Sister   . Hypertension Sister   . Hyperlipidemia Sister     SOCIAL HISTORY:   Social History   Tobacco Use  . Smoking status: Former Smoker    Packs/day: 1.00    Years: 30.00    Pack years: 30.00    Types: Cigarettes    Quit date: 1995    Years since quitting:  26.7  . Smokeless tobacco: Former Network engineer  . Vaping Use: Never used  Substance Use Topics  . Alcohol use: Not Currently    Comment: occasionally wine   . Drug use: No    ALLERGIES:  has No Known Allergies.  MEDICATIONS:  Current Outpatient Medications  Medication Sig Dispense Refill  . ASPIRIN LOW DOSE 81 MG chewable tablet Chew 81 mg by mouth daily.    . Calcium Carb-Cholecalciferol (CALCIUM 600 + D) 600-200 MG-UNIT TABS Take 1 tablet by mouth daily. Patient takes 1000 mg daily.    . finasteride (PROSCAR) 5 MG tablet Take 5 mg  by mouth daily.    . fluticasone (FLONASE) 50 MCG/ACT nasal spray Place 2 sprays into both nostrils daily. Use for 4-6 weeks then stop and use seasonally or as needed. 16 g 5  . lisinopril (ZESTRIL) 5 MG tablet Take 5 mg by mouth daily.    Marland Kitchen loratadine (CLARITIN) 10 MG tablet Take 1 tablet (10 mg total) by mouth daily. 90 tablet 3  . LUBRICATING PLUS EYE DROPS 0.5 % SOLN as needed.     . naproxen (NAPROSYN) 500 MG tablet Take 1 tablet (500 mg total) by mouth 2 (two) times daily as needed. 90 tablet 0  . timolol (TIMOPTIC) 0.5 % ophthalmic solution 1 drop 2 (two) times daily.    . Budeson-Glycopyrrol-Formoterol (BREZTRI AEROSPHERE) 160-9-4.8 MCG/ACT AERO Inhale 2 puffs into the lungs 2 (two) times daily. 10.7 g 6  . budesonide-formoterol (SYMBICORT) 160-4.5 MCG/ACT inhaler Inhale 2 puffs into the lungs 2 (two) times daily.  (Patient not taking: Reported on 12/29/2019)    . cholecalciferol (VITAMIN D) 25 MCG (1000 UNIT) tablet Take 1,000 Units by mouth daily.    . furosemide (LASIX) 20 MG tablet Take 1 tablet (20 mg total) by mouth daily. 90 tablet 3  . Lidocaine-Hydrocortisone Ace 2-2 % KIT Use rectal suppository twice daily as needed for up to 1 week for hemorrhoids 1 each 3  . melatonin 5 MG TABS Take 5 mg by mouth at bedtime.  (Patient not taking: Reported on 12/29/2019)    . traZODone (DESYREL) 100 MG tablet Take 1 tablet (100 mg total) by mouth at bedtime. 30 tablet 2   No current facility-administered medications for this visit.    PHYSICAL EXAMINATION: ECOG PERFORMANCE STATUS: 0 - Asymptomatic  BP (!) 134/54 (BP Location: Left Arm, Patient Position: Sitting, Cuff Size: Large)   Pulse 94   Temp (!) 97.5 F (36.4 C) (Tympanic)   Resp 18   Ht 5' 10" (1.778 m)   Wt 208 lb (94.3 kg)   SpO2 98%   BMI 29.84 kg/m   Filed Weights   12/21/19 1449  Weight: 208 lb (94.3 kg)    Physical Exam HENT:     Head: Normocephalic and atraumatic.     Mouth/Throat:     Pharynx: No oropharyngeal  exudate.  Eyes:     Pupils: Pupils are equal, round, and reactive to light.  Cardiovascular:     Rate and Rhythm: Normal rate and regular rhythm.  Pulmonary:     Effort: Pulmonary effort is normal. No respiratory distress.     Breath sounds: Normal breath sounds. No wheezing.  Abdominal:     General: Bowel sounds are normal. There is no distension.     Palpations: Abdomen is soft. There is no mass.     Tenderness: There is no abdominal tenderness. There is no guarding or rebound.  Musculoskeletal:  General: No tenderness. Normal range of motion.     Cervical back: Normal range of motion and neck supple.  Skin:    General: Skin is warm.  Neurological:     Mental Status: He is alert and oriented to person, place, and time.  Psychiatric:        Mood and Affect: Affect normal.     LABORATORY DATA:  I have reviewed the data as listed    Component Value Date/Time   NA 136 12/29/2019 1210   K 4.5 12/29/2019 1210   CL 99 12/29/2019 1210   CO2 26 12/29/2019 1210   GLUCOSE 121 (H) 12/29/2019 1210   GLUCOSE 92 11/25/2019 1547   BUN 22 12/29/2019 1210   CREATININE 0.88 12/29/2019 1210   CREATININE 0.83 06/29/2019 0837   CALCIUM 9.2 12/29/2019 1210   PROT 6.3 06/29/2019 0837   PROT 6.3 02/16/2015 1001   ALBUMIN 3.6 11/25/2019 1547   ALBUMIN 3.8 02/16/2015 1001   AST 21 06/29/2019 0837   ALT 14 06/29/2019 0837   ALKPHOS 48 08/14/2016 1000   BILITOT 1.1 06/29/2019 0837   BILITOT 0.8 02/16/2015 1001   GFRNONAA 77 12/29/2019 1210   GFRNONAA 79 06/29/2019 0837   GFRAA 89 12/29/2019 1210   GFRAA 92 06/29/2019 0837    No results found for: SPEP, UPEP  Lab Results  Component Value Date   WBC 7.6 12/06/2019   NEUTROABS 5,510 12/06/2019   HGB 11.3 (L) 12/06/2019   HCT 34.8 (L) 12/06/2019   MCV 100.3 (H) 12/06/2019   PLT 172 12/06/2019      Chemistry      Component Value Date/Time   NA 136 12/29/2019 1210   K 4.5 12/29/2019 1210   CL 99 12/29/2019 1210   CO2 26  12/29/2019 1210   BUN 22 12/29/2019 1210   CREATININE 0.88 12/29/2019 1210   CREATININE 0.83 06/29/2019 0837      Component Value Date/Time   CALCIUM 9.2 12/29/2019 1210   ALKPHOS 48 08/14/2016 1000   AST 21 06/29/2019 0837   ALT 14 06/29/2019 0837   BILITOT 1.1 06/29/2019 0837   BILITOT 0.8 02/16/2015 1001        ASSESSMENT & PLAN:   Macrocytic anemia # Chronic low platelets [~90s to 100] stable at least since 2006. The etiology is unclear- ITP versus MDS [never had bone marrow biopsy]. 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  Today's platelet count is 172 hemoglobin 13.4 white count 7.1.   Patient continues to be asymptomatic without any bleeding episodes- recommend surveillance.  # DISPOSITION: # Patient follow-up with [place] in 3 months- MD;labs- cbc/cmp/LDH- Dr.B     Raymond Sickle, MD 01/23/2020 9:08 AM

## 2019-12-21 NOTE — Assessment & Plan Note (Addendum)
#  Chronic low platelets [~90s to 100] stable at least since 2006. The etiology is unclear- ITP versus MDS [never had bone marrow biopsy]. 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  Today's platelet count is 172 hemoglobin 13.4 white count 7.1.   Patient continues to be asymptomatic without any bleeding episodes- recommend surveillance.  # DISPOSITION: # Patient follow-up with [place] in 3 months- MD;labs- cbc/cmp/LDH- Dr.B

## 2019-12-22 ENCOUNTER — Telehealth: Payer: Self-pay | Admitting: Family Medicine

## 2019-12-22 DIAGNOSIS — F5101 Primary insomnia: Secondary | ICD-10-CM

## 2019-12-22 MED ORDER — TRAZODONE HCL 100 MG PO TABS: 100.0000 mg | ORAL_TABLET | Freq: Every day | ORAL | 2 refills | Status: AC

## 2019-12-22 NOTE — Telephone Encounter (Signed)
Please notify patient that we can increase his dose to Trazodone 100mg .  New rx sent for Trazodone 100mg  tab - take one every night.  If he has left over 50mg  he can take two 50mg  = 100mg  nightly if he prefers, but new rx is at pharmacy for just 1 pill.  Nobie Putnam, DO [facility] 12/22/2019, 1:04 PM

## 2019-12-22 NOTE — Telephone Encounter (Signed)
Patient would like the nurse or doctor to call him regarding increasing the dosage of his traZODone (DESYREL) 50 MG tablet.  He stated it worked for about a week with his sleep, but now it is not working and he wanted to know if he could increase the dosage.  Please advise and call to discuss at [phone]

## 2019-12-22 NOTE — Telephone Encounter (Signed)
Patient informed as per Dr Raliegh Ip and also scheduled his appointment for wheelchair paper work.

## 2019-12-23 ENCOUNTER — Other Ambulatory Visit: Payer: Self-pay

## 2019-12-23 ENCOUNTER — Ambulatory Visit (INDEPENDENT_AMBULATORY_CARE_PROVIDER_SITE_OTHER): Payer: Medicare Other | Admitting: Nurse Practitioner

## 2019-12-23 ENCOUNTER — Encounter: Payer: Self-pay | Admitting: Nurse Practitioner

## 2019-12-23 VITALS — BP 150/80 | HR 91 | Ht 70.0 in | Wt 206.4 lb

## 2019-12-23 DIAGNOSIS — I739 Peripheral vascular disease, unspecified: Secondary | ICD-10-CM

## 2019-12-23 DIAGNOSIS — I10 Essential (primary) hypertension: Secondary | ICD-10-CM

## 2019-12-23 DIAGNOSIS — I340 Nonrheumatic mitral (valve) insufficiency: Secondary | ICD-10-CM | POA: Diagnosis not present

## 2019-12-23 DIAGNOSIS — I5031 Acute diastolic (congestive) heart failure: Secondary | ICD-10-CM

## 2019-12-23 MED ORDER — FUROSEMIDE 40 MG PO TABS: 40.0000 mg | ORAL_TABLET | Freq: Every day | ORAL | 3 refills | Status: DC

## 2019-12-23 NOTE — Patient Instructions (Signed)
Medication Instructions:  1- START Lasix Take 1 tablet (40 mg total) by mouth daily *If you need a refill on your cardiac medications before your next appointment, please call your pharmacy*   Lab Work: none ordered If you have labs (blood work) drawn today and your tests are completely normal, you will receive your results only by:  [place] (if you have MyChart) OR  A paper copy in the mail If you have any lab test that is abnormal or we need to change your treatment, we will call you to review the results.   Testing/Procedures: none ordered  Follow-Up: At [facility], you and your health needs are our priority.  As part of our continuing mission to provide you with exceptional heart care, we have created designated Provider Care Teams.  These Care Teams include your primary Cardiologist (physician) and Advanced Practice Providers (APPs -  Physician Assistants and Nurse Practitioners) who all work together to provide you with the care you need, when you need it.  We recommend signing up for the patient portal called "MyChart".  Sign up information is provided on this After Visit Summary.  MyChart is used to connect with patients for Virtual Visits (Telemedicine).  Patients are able to view lab/test results, encounter notes, upcoming appointments, etc.  Non-urgent messages can be sent to your provider as well.   To learn more about what you can do with MyChart, go to [email or web address].    Your next appointment:   As scheduled

## 2019-12-23 NOTE — Progress Notes (Signed)
Office Visit    Patient Name: Raymond Barnett Date of Encounter: 12/23/2019  Primary Care Provider:  [name], DO Primary Cardiologist:  Nelva Bush, MD  Chief Complaint    86 year old male with a history of chronic dyspnea, heart failure with improved ejection fraction (55-60% 10/2019), peripheral arterial disease with abnormal right lower extremity ABIs, remote osteomyelitis, COPD, thrombocytopenia, BPH, arthritis and hypertension, who presents for follow-up related to dyspnea.  Past Medical History    Past Medical History:  Diagnosis Date  . (HFpEF) heart failure with improved ejection fraction ([place])    a. 08/2017 Echo: EF 45-50%; b. 10/2019 Echo: EF 55-60%. No rwma, mild LVH, Gr2 DD, RVSP 47.41mHg. Mod dil LA. Mod MR. Asc Ao 466m  . Arthritis   . Benign prostate hyperplasia    lower urinary tract symptoms  . Cataracts, bilateral   . COPD [place] disease) [place]  . DDD (degenerative disc disease), lumbar   . Glaucoma   . H/O carpal tunnel syndrome   . History of osteomyelitis 1953   right leg  . Hypertension   . Moderate mitral regurgitation    a. 10/2019 Echo: Mod MR.  . Marland KitchenAD (peripheral artery disease) ([place]   a. 08/2017 LE ABI & Duplex: ABI R 0.60, L 1.07. 30-49% R popliteal and 50-74% R PT stenoses.  . Thrombocytopenia ([facility] [facility]   Past Surgical History:  Procedure Laterality Date  . CARPAL TUNNEL RELEASE Left   . CARPAL TUNNEL RELEASE Right   . COLONOSCOPY  2015   Dr. [name]. LEG SURGERY Right 1953  . [facility] TOTAL KNEE ARTHROPLASTY Right     Allergies  No Active Allergies  History of Present Illness    [age]ear old male with a history of chronic dyspnea, heart failure with improved ejection fraction (55-60% 10/2019), peripheral arterial disease with abnormal right lower extremity ABIs, remote osteomyelitis, COPD, thrombocytopenia, BPH, arthritis and hypertension.  He was  previously evaluated May 2019 in the setting of dyspnea and poorly healing right lower extremity wound.  Echocardiogram showed mild LV dysfunction with an EF of 45 to 50% and question of inferior wall motion abnormality.  Lower extremity arterial duplex and ABIs were abnormal suggesting right popliteal and right posterior tibial stenoses (ABI was 0.60).  Additional cardiac ischemic testing as well as abdominal aortogram with peripheral runoff were recommended however, patient deferred.  At his last clinic visit in December 2019, he continued to have poor right ankle wound healing and dyspnea on exertion, though was euvolemic on examination.  He continue to avoid additional testing and medical management with aspirin, beta-blocker, and statin therapy was advised.  He recently had worsening of dyspnea on exertion prompting follow-up echocardiogram in July.  This showed normal LV function (55-60%) with moderate mitral regurgitation.  RVSP was elevated at 47.7 mmHg.  He followed up with pulmonology with recommendation for ongoing inhaler therapy and cardiology follow-up.  Patient says that over the past 3 to 4 weeks, he has been experiencing dyspnea just walking about 30 feet in his home to his bathroom.  This is a fairly profound drop off from his usual level of activity.  He has not experienced any chest pain or dyspnea.  Over the past 4 to 5 days, he is also been experiencing bilateral lower extremity swelling.  He always has some right lower extremity swelling in the setting of prior surgeries and infection  with ongoing delayed healing of the right lateral ankle.  Is also been experiencing orthopnea which has resulted in restless and poor sleep.  He denies PND, palpitations, dizziness, syncope, or early satiety.  Home Medications    Prior to Admission medications   Medication Sig Start Date End Date Taking? Authorizing Provider  ASPIRIN LOW DOSE 81 MG chewable tablet Chew 81 mg by mouth daily. 07/17/19    [provider]  Budeson-Glycopyrrol-Formoterol (BREZTRI AEROSPHERE) 160-9-4.8 MCG/ACT AERO Inhale 2 puffs into the lungs 2 (two) times daily. Patient not taking: Reported on 12/21/2019 11/25/19   Tyler Pita, MD  budesonide-formoterol [facility]) 160-4.5 MCG/ACT inhaler Inhale 2 puffs into the lungs 2 (two) times daily.  Patient not taking: Reported on 12/08/2019    [provider]  Calcium Carb-Cholecalciferol (CALCIUM 600 + D) 600-200 MG-UNIT TABS Take 1 tablet by mouth daily. Patient takes 1000 mg daily.    [provider]  finasteride (PROSCAR) 5 MG tablet Take 5 mg by mouth daily.    [provider]  fluticasone (FLONASE) 50 MCG/ACT nasal spray Place 2 sprays into both nostrils daily. Use for 4-6 weeks then stop and use seasonally or as needed. 11/09/19   [name], [name], DO  Lidocaine-Hydrocortisone Ace 2-2 % KIT Use rectal suppository twice daily as needed for up to 1 week for hemorrhoids Patient not taking: Reported on 12/08/2019 06/18/17   [name], DO  lisinopril (ZESTRIL) 5 MG tablet Take 5 mg by mouth daily.    [provider]  loratadine (CLARITIN) 10 MG tablet Take 1 tablet (10 mg total) by mouth daily. 11/09/19   [name], [name], DO  LUBRICATING PLUS EYE DROPS 0.5 % SOLN  08/25/19   [provider]  melatonin 5 MG TABS Take 5 mg by mouth at bedtime. Patient not taking: Reported on 12/21/2019    [provider]  naproxen (NAPROSYN) 500 MG tablet Take 1 tablet (500 mg total) by mouth 2 (two) times daily as needed. 11/16/19   [name], [name], DO  timolol (TIMOPTIC) 0.5 % ophthalmic solution 1 drop 2 (two) times daily.    [provider]  traZODone (DESYREL) 100 MG tablet Take 1 tablet (100 mg total) by mouth at bedtime. 12/22/19   [name], DO    Review of Systems    Progressive dyspnea on exertion, orthopnea, and lower extremity swelling as outlined above.   He denies chest pain, claudication, palpitations, PND, dizziness, syncope, or early satiety.  All other systems reviewed and are otherwise negative except as noted above.  Physical Exam    VS:  BP (!) 150/80 (BP Location: Left Arm, Patient Position: Sitting, Cuff Size: Large)   Pulse 91   Ht '5\' 10"'  (1.778 m)   Wt 206 lb 6 oz (93.6 kg)   SpO2 93%   BMI 29.61 kg/m  , BMI Body mass index is 29.61 kg/m. GEN: Well nourished, well developed, in no acute distress. HEENT: normal. Neck: Supple, moderately elevated JVP, no carotid bruits, or masses. Cardiac: RRR, no murmurs, rubs, or gallops. No clubbing, cyanosis, 2+ bilateral lower extremity edema to the knees.  Difficult to palpate right PT.  Left PT 1+. Respiratory:  Respirations regular and unlabored, bibasilar crackles. GI: Obese, soft, nontender, nondistended, BS + x 4. MS: no deformity or atrophy. Skin: warm and dry, no rash. Neuro:  Strength and sensation are intact. Psych: Normal affect.  Accessory Clinical Findings    ECG personally reviewed by me today -  regular sinus rhythm, 91, right bundle branch block, question inferior infarct- no acute changes.  Lab Results  Component Value Date   WBC 7.6 12/06/2019   HGB 11.3 (L) 12/06/2019   HCT 34.8 (L) 12/06/2019   MCV 100.3 (H) 12/06/2019   PLT 172 12/06/2019   Lab Results  Component Value Date   CREATININE 0.87 11/25/2019   BUN 14 11/25/2019   NA 133 (L) 11/25/2019   K 4.5 11/25/2019   CL 101 11/25/2019   CO2 26 11/25/2019   Lab Results  Component Value Date   ALT 14 06/29/2019   AST 21 06/29/2019   ALKPHOS 48 08/14/2016   BILITOT 1.1 06/29/2019   Lab Results  Component Value Date   CHOL 136 06/29/2019   HDL 41 06/29/2019   LDLCALC 79 06/29/2019   TRIG 82 06/29/2019   CHOLHDL 3.3 06/29/2019    Lab Results  Component Value Date   HGBA1C 5.0 06/29/2019    Assessment & Plan    1.  Acute on chronic heart failure with improved ejection fraction: Patient  previously diagnosed with cardiomyopathy, presumed to be ischemic, in May 2019 in the setting an EF of 45 to 50% with probable inferior wall motion abnormality on echo at that time.  He was previously offered ischemic evaluation but deferred.  Over the past month or more, he has been experiencing worsening dyspnea and over the past week or so, he has been experiencing bilateral lower extremity edema.  He had an echocardiogram in July in the setting of dyspnea showing improvement in LV function to 55-60% with mild LVH, grade 2 diastolic dysfunction, and elevated RVSP of 47.7 mmHg.  His weight is up slightly above prior baseline and he is volume overloaded on examination.  Blood pressure is elevated at 150/80 today.  I am going to add Lasix 40 mg daily and continue home dose of lisinopril.  We discussed his symptoms and potential work-up at length.  I did recommend an ischemic evaluation with stress testing at a minimum however, he prefers to hold off on any additional testing at this time and would like to see how he does with diuretic therapy first.  We will plan to see him back in 1 week with follow-up basic metabolic panel at that time.  If he has no significant improvement in symptoms, would have a low threshold to pursue right and left heart catheterization, if patient were willing.  2.  Essential hypertension: Blood pressure elevated today in the setting of volume overload.  Adding Lasix 40 mg daily as above.  Continue lisinopril therapy.  3.  Peripheral arterial disease: Abnormal ABIs on the right in 2019 with prior recommendation for peripheral angiography/runoff in the setting of poorly healing right ankle wound. He does not experience claudication. Patient says he does not remember ever having a conversation about a peripheral angiogram but still is not interested.  He remains on aspirin therapy.  He is not on a statin but LDL was only 79 in March.    4.  Moderate mitral regurgitation: Noted on recent  echo.  We will plan to follow-up echo in 1 year.   5.  Disposition: Follow-up in 1 week with basic metabolic panel at that time.  Murray Hodgkins, NP 12/23/2019, 6:10 PM

## 2019-12-29 ENCOUNTER — Encounter: Payer: Self-pay | Admitting: Family Medicine

## 2019-12-29 ENCOUNTER — Ambulatory Visit (INDEPENDENT_AMBULATORY_CARE_PROVIDER_SITE_OTHER): Payer: Medicare Other | Admitting: Internal Medicine

## 2019-12-29 ENCOUNTER — Encounter: Payer: Self-pay | Admitting: Internal Medicine

## 2019-12-29 ENCOUNTER — Ambulatory Visit (INDEPENDENT_AMBULATORY_CARE_PROVIDER_SITE_OTHER): Payer: Medicare Other | Admitting: Family Medicine

## 2019-12-29 ENCOUNTER — Other Ambulatory Visit: Payer: Self-pay

## 2019-12-29 VITALS — BP 102/48 | HR 73 | Temp 96.9°F | Resp 16 | Ht 70.0 in | Wt 202.0 lb

## 2019-12-29 VITALS — BP 120/70 | HR 77 | Ht 70.0 in | Wt 202.1 lb

## 2019-12-29 DIAGNOSIS — I5032 Chronic diastolic (congestive) heart failure: Secondary | ICD-10-CM | POA: Diagnosis not present

## 2019-12-29 DIAGNOSIS — I5033 Acute on chronic diastolic (congestive) heart failure: Secondary | ICD-10-CM

## 2019-12-29 DIAGNOSIS — I208 Other forms of angina pectoris: Secondary | ICD-10-CM | POA: Diagnosis not present

## 2019-12-29 DIAGNOSIS — M170 Bilateral primary osteoarthritis of knee: Secondary | ICD-10-CM | POA: Diagnosis not present

## 2019-12-29 DIAGNOSIS — M5136 Other intervertebral disc degeneration, lumbar region: Secondary | ICD-10-CM

## 2019-12-29 DIAGNOSIS — I998 Other disorder of circulatory system: Secondary | ICD-10-CM | POA: Diagnosis not present

## 2019-12-29 DIAGNOSIS — M545 Low back pain: Secondary | ICD-10-CM | POA: Diagnosis not present

## 2019-12-29 DIAGNOSIS — I70229 Atherosclerosis of native arteries of extremities with rest pain, unspecified extremity: Secondary | ICD-10-CM

## 2019-12-29 DIAGNOSIS — I10 Essential (primary) hypertension: Secondary | ICD-10-CM | POA: Diagnosis not present

## 2019-12-29 DIAGNOSIS — G8929 Other chronic pain: Secondary | ICD-10-CM | POA: Diagnosis not present

## 2019-12-29 DIAGNOSIS — Z96651 Presence of right artificial knee joint: Secondary | ICD-10-CM | POA: Diagnosis not present

## 2019-12-29 DIAGNOSIS — J438 Other emphysema: Secondary | ICD-10-CM

## 2019-12-29 DIAGNOSIS — I739 Peripheral vascular disease, unspecified: Secondary | ICD-10-CM

## 2019-12-29 NOTE — Patient Instructions (Signed)
Medication Instructions:  Your physician recommends that you continue on your current medications as directed. Please refer to the Current Medication list given to you today.  *If you need a refill on your cardiac medications before your next appointment, please call your pharmacy*  Lab Work: Your physician recommends that you return for lab work in: [place].   If you have labs (blood work) drawn today and your tests are completely normal, you will receive your results only by: Marland Kitchen MyChart Message (if you have MyChart) OR . A paper copy in the mail If you have any lab test that is abnormal or we need to change your treatment, we will call you to review the results.  Testing/Procedures: none  Follow-Up: At [facility], you and your health needs are our priority.  As part of our continuing mission to provide you with exceptional heart care, we have created designated Provider Care Teams.  These Care Teams include your primary Cardiologist (physician) and Advanced Practice Providers (APPs -  Physician Assistants and Nurse Practitioners) who all work together to provide you with the care you need, when you need it.  We recommend signing up for the patient portal called "MyChart".  Sign up information is provided on this After Visit Summary.  MyChart is used to connect with patients for Virtual Visits (Telemedicine).  Patients are able to view lab/test results, encounter notes, upcoming appointments, etc.  Non-urgent messages can be sent to your provider as well.   To learn more about what you can do with MyChart, go to [email or web address].    Your next appointment:   3 month(s)  The format for your next appointment:   In Person  Provider:    You may see Nelva Bush, MD or one of the following Advanced Practice Providers on your designated Care Team:    Murray Hodgkins, NP  Christell Faith, PA-C  Marrianne Mood, PA-C

## 2019-12-29 NOTE — Progress Notes (Addendum)
Subjective:    Patient ID: Raymond Barnett, male    DOB: 02/20/1930, 86 y.o.   MRN: [id number]  Raymond Barnett is a 86 y.o. male presenting on 12/29/2019 for Osteoarthritis and Mobility Exam  He is accompanied by his wife, Kathlee Nations.   HPI     Osteoarthritis Multiple joints (knee, lumbar spine) Chronic joint pain, knees, back S/p Right knee total knee replacement surgery Emphysema Reason for mobility exam - Progressive decline in mobility due to chronic medical conditions with joint pain as well as shortness of breath.    Power Wheelchair Mobility Assessment  - Patient has difficulty with reaching different areas within the house such as bedroom, kitchen and bathroom as he is limited in mobility, a power wheelchair would help access different areas of house more frequently and perform ADLs such as meal prep and cooking in kitchen and to bathroom for toileting and to bedroom for changing clothes. He requires additional assistance from wife helping with ADLs getting dressed.  Currently he is attempting to use a cane at home, as his mobility device - he has challenges using this device. - Unable to successfully use cane or walker regularly due to knee pain and back pain worse with standing prolonged, pain is up to moderate to severe 8 out of 10, and he has had near fall or at risk of fall while using these mobility devices. His pain causes him to be weaker and lack of support with ambulation and prolong standing.  - Unable to use manual wheelchair due to reduced upper and lower body strength and limited range of motion with arthritis.  - Unable to use a scooter due to limited ability to transfer on and off scooter, due to muscle weakness and reduced range of motion in hips  He is interested in a Power Mobility Device today.   Additional updates - He saw Cardiology on 12/23/19 they ordered diuretic lasix 40mg  PRN and he has tried this with some good results, improved urine output and improved  breathing, not resolved but better.    Depression screen [facility] 2/9 11/09/2019 07/06/2019 06/08/2019  Decreased Interest 0 0 0  Down, Depressed, Hopeless 0 0 0  PHQ - 2 Score 0 0 0  Altered sleeping - - -  Tired, decreased energy - - -  Change in appetite - - -  Feeling bad or failure about yourself  - - -  Trouble concentrating - - -  Moving slowly or fidgety/restless - - -  Suicidal thoughts - - -  PHQ-9 Score - - -  Difficult doing work/chores - - -    Social History   Tobacco Use  . Smoking status: Former Smoker    Packs/day: 1.00    Years: 30.00    Pack years: 30.00    Types: Cigarettes    Quit date: 1995    Years since quitting: 26.7  . Smokeless tobacco: Former Network engineer  . Vaping Use: Never used  Substance Use Topics  . Alcohol use: Not Currently    Comment: occasionally wine   . Drug use: No    Review of Systems Per HPI unless specifically indicated above     Objective:    BP (!) 102/48 (BP Location: Left Arm, Patient Position: Sitting, Cuff Size: Normal)   Pulse 73   Temp (!) 96.9 F (36.1 C) (Temporal)   Resp 16   Ht 5\' 10"  (1.778 m)   Wt 202 lb (91.6 kg)  SpO2 97%   BMI 28.98 kg/m   Wt Readings from Last 3 Encounters:  12/29/19 202 lb (91.6 kg)  12/29/19 202 lb 2 oz (91.7 kg)  12/23/19 206 lb 6 oz (93.6 kg)    Physical Exam Vitals and nursing note reviewed.  Constitutional:      General: He is not in acute distress.    Appearance: He is well-developed. He is not diaphoretic.     Comments: Well-appearing elderly 86 yr old male, comfortable, cooperative  HENT:     Head: Normocephalic and atraumatic.  Eyes:     General:        Right eye: No discharge.        Left eye: No discharge.     Conjunctiva/sclera: Conjunctivae normal.     Pupils: Pupils are equal, round, and reactive to light.  Neck:     Thyroid: No thyromegaly.  Cardiovascular:     Rate and Rhythm: Normal rate and regular rhythm.     Heart sounds: Normal heart sounds. No  murmur heard.   Pulmonary:     Effort: Pulmonary effort is normal. No respiratory distress.     Breath sounds: No wheezing or rales.     Comments: Mild reduced air movement diffusely. No increased work of breathing. No coughing or crackles. Abdominal:     General: Bowel sounds are normal. There is no distension.     Palpations: Abdomen is soft. There is no mass.     Tenderness: There is no abdominal tenderness.  Skin:    General: Skin is warm and dry.     Findings: No erythema or rash.  Neurological:     Mental Status: He is alert and oriented to person, place, and time.     Comments: Distal sensation intact to light touch all extremities  Psychiatric:        Behavior: Behavior normal.     Comments: Well groomed, good eye contact, normal speech and thoughts     MSK Exam  He is using a cane.  Upper Extremities  Right Grip - muscle strength 4 out of 5 Biceps flex/ext - 4 out of 5 Triceps flex/ext - 4 out of 5 - Range of Motion: Mild reduced - shoulder ROM limited to 90* flexion, Limited range behind back or internal rotation and abduction of shoulder  Left Grip - muscle strength 4 out of 5 Biceps flex/ext - 4 out of 5 Triceps flex/ext - 4 out of 5 - Range of Motion: Mild reduced - shoulder ROM limited to 90* flexion, Limited range behind back or internal rotation and abduction of shoulder  ---------------------  Lower Extremities  Right Knee flex / extend - muscle strength mild reduced strength 4 out of 5 Ankle dorisflex/plantar flex - limited 3 out of 5 due to prior surgical fusion Hip Flex/extend - muscle strength dramatically reduced 2 out of 5 -  Range of motion: mild reduced hip flexion < 45* flexion, significantly reduced right foot/ankle flexion with 0* of mobility of ankle.  Left  Knee flex / extend - muscle strength mild reduced strength 4 out of 5 Ankle dorisflex/plantar flex - muscle strength 5 out of 5 Hip Flex/extend - muscle strength dramatically  reduced 2 out of 5 - Range of motion: mild reduced with hip flexion, < 45* flexion   Results for orders placed or performed in visit on 11/30/19  Folate  Result Value Ref Range   Folate 11.4 ng/mL  Vitamin B12  Result Value Ref Range  Vitamin B-12 532 200 - 1,100 pg/mL  Iron, TIBC and Ferritin Panel  Result Value Ref Range   Iron 106 50 - 180 mcg/dL   TIBC 239 (L) 250 - 425 mcg/dL (calc)   %SAT 44 20 - 48 % (calc)   Ferritin 794 (H) 24 - 380 ng/mL  CBC with Differential/Platelet  Result Value Ref Range   WBC 7.6 3.8 - 10.8 Thousand/uL   RBC 3.47 (L) 4.20 - 5.80 Million/uL   Hemoglobin 11.3 (L) 13.2 - 17.1 g/dL   HCT 34.8 (L) 38 - 50 %   MCV 100.3 (H) 80.0 - 100.0 fL   MCH 32.6 27.0 - 33.0 pg   MCHC 32.5 32.0 - 36.0 g/dL   RDW 19.5 (H) 11.0 - 15.0 %   Platelets 172 140 - 400 Thousand/uL   MPV 12.8 (H) 7.5 - 12.5 fL   Neutro Abs 5,510 1,500 - 7,800 cells/uL   Lymphs Abs 1,254 850 - 3,900 cells/uL   Absolute Monocytes 616 200 - 950 cells/uL   Eosinophils Absolute 190 15 - 500 cells/uL   Basophils Absolute 30 0 - 200 cells/uL   Neutrophils Relative % 72.5 %   Total Lymphocyte 16.5 %   Monocytes Relative 8.1 %   Eosinophils Relative 2.5 %   Basophils Relative 0.4 %      Assessment & Plan:   Problem List Items Addressed This Visit    Status post total right knee replacement   Primary osteoarthritis of both knees - Primary   Paraseptal emphysema (HCC)   DDD (degenerative disc disease), lumbar   Chronic bilateral low back pain without sciatica      No orders of the defined types were placed in this encounter.   A&P Power Mobility Device Assessment See above HPI for data regarding medical necessity for this device  He would benefit from a Power Mobility Device to improve mobility at home, allow to perform ADLs, improve function, reduce fall risk. He has limitations as documented with strength and weakness and ability to transfer that limit from other manual devices  such as cane, walker, manual wheelchair, or scooter.  Patient has normal cognition, oriented, without memory loss or decline. They have the mental capacity to operate a power wheelchair safely and is willing and motivated to use this in the  home for improved mobility and function.  My patient has a mobility limitation that significantly impairs their ability to participate in one or more of the mobility-related activities of daily living such as toileting, feeding, dressing, grooming, and bathing in customary locations in the home; AND  The mobility deficit cannot be sufficiently resolved by the use of an appropriately fitted cane or walker; AND  My patient does not have the strength to use a manual wheelchair to propel it. My patient does not have the strength or postural stability due to back pain to safely operate a scooter in the home.  His chronic medical conditions with osteoarthritis joint pain and emphysema are chronic progressive problems and will not be expected to resolve.  Orders signed and to be faxed 12/30/19 back to [facility] - fax [phone] for processing.   Follow up plan: Return if symptoms worsen or fail to improve.   Nobie Putnam, [facility] 12/29/2019, 2:12 PM

## 2019-12-29 NOTE — Progress Notes (Signed)
Follow-up Outpatient Visit Date: 12/29/2019  Primary Care Provider: [name], DO [address] [place] [address]  Chief Complaint: Follow-up shortness of breath  HPI:  Raymond Barnett is a 86 y.o. male with history of chronic dyspnea, heart failure with improved ejection fraction (55-60% 10/2019), peripheral arterial disease with abnormal right lower extremity ABIs, remote osteomyelitis, COPD, thrombocytopenia, BPH, arthritis and hypertension, who presents for follow-up of heart failure.  He was seen last week for evaluation of progressive dyspnea over 1 month by Raymond Bayley, Raymond Barnett.  He has been lost to follow-up with our practice since late 2019.  Echo in July ordered by his pulmonologist showed preserved LVEF with moderate mitral regurgitation and moderate pulmonary hypertension.  He was placed on furosemide 40 mg daily.  Ischemia evaluation was also recommended, though the patient again declined.  Today, Raymond Barnett reports that his breathing is much better and is back to baseline (though not "normal").  He denies edema and orthopnea.  He notes occasional "pressure" in his chest when he "panics."  He denies leg pain but still has a small wound on his right calf that has yet to completely heal.  --------------------------------------------------------------------------------------------------  Past Medical History:  Diagnosis Date  . (HFpEF) heart failure with improved ejection fraction ([place])    a. 08/2017 Echo: EF 45-50%; b. 10/2019 Echo: EF 55-60%. No rwma, mild LVH, Gr2 DD, RVSP 47.86mmHg. Mod dil LA. Mod MR. Asc Ao 50mm.  . Arthritis   . Benign prostate hyperplasia    lower urinary tract symptoms  . Cataracts, bilateral   . COPD (chronic obstructive pulmonary disease) ([place])   . DDD (degenerative disc disease), lumbar   . Glaucoma   . H/O carpal tunnel syndrome   . History of osteomyelitis 1953   right leg  . Hypertension   . Moderate mitral regurgitation    a. 10/2019 Echo:  Mod MR.  Marland Kitchen PAD (peripheral artery disease) ([place])    a. 08/2017 LE ABI & Duplex: ABI R 0.60, L 1.07. 30-49% R popliteal and 50-74% R PT stenoses.  . Thrombocytopenia ([place])    Past Surgical History:  Procedure Laterality Date  . CARPAL TUNNEL RELEASE Left   . CARPAL TUNNEL RELEASE Right   . COLONOSCOPY  2015   Dr. Allen Norris  . LEG SURGERY Right 1953  . SKIN GRAFT Right 05/01/2017   [facility] foot [facility]  . TOTAL KNEE ARTHROPLASTY Right     Current Meds  Medication Sig  . ASPIRIN LOW DOSE 81 MG chewable tablet Chew 81 mg by mouth daily.  . Calcium Carb-Cholecalciferol (CALCIUM 600 + D) 600-200 MG-UNIT TABS Take 1 tablet by mouth daily. Patient takes 1000 mg daily.  . cholecalciferol (VITAMIN D) 25 MCG (1000 UNIT) tablet Take 1,000 Units by mouth daily.  . finasteride (PROSCAR) 5 MG tablet Take 5 mg by mouth daily.  . fluticasone (FLONASE) 50 MCG/ACT nasal spray Place 2 sprays into both nostrils daily. Use for 4-6 weeks then stop and use seasonally or as needed.  . furosemide (LASIX) 40 MG tablet Take 1 tablet (40 mg total) by mouth daily.  Marland Kitchen lisinopril (ZESTRIL) 5 MG tablet Take 5 mg by mouth daily.  Marland Kitchen loratadine (CLARITIN) 10 MG tablet Take 1 tablet (10 mg total) by mouth daily.  . LUBRICATING PLUS EYE DROPS 0.5 % SOLN as needed.   . naproxen (NAPROSYN) 500 MG tablet Take 1 tablet (500 mg total) by mouth 2 (two) times daily as needed.  . timolol (  TIMOPTIC) 0.5 % ophthalmic solution 1 drop 2 (two) times daily.  . traZODone (DESYREL) 100 MG tablet Take 1 tablet (100 mg total) by mouth at bedtime.    Allergies: Patient has no active allergies.  Social History   Tobacco Use  . Smoking status: Former Smoker    Packs/day: 1.00    Years: 30.00    Pack years: 30.00    Types: Cigarettes    Quit date: 1995    Years since quitting: 26.7  . Smokeless tobacco: Former Network engineer  . Vaping Use: Never used  Substance Use Topics  . Alcohol use: Not Currently    Comment: occasionally wine    . Drug use: No    Family History  Problem Relation Age of Onset  . Colon cancer Mother 32  . Thrombosis Father 2  . Heart disease Father        Heart valve problem  . AAA (abdominal aortic aneurysm) Father   . Hypertension Sister   . Hyperlipidemia Sister   . Hypertension Sister   . Hyperlipidemia Sister   . Hypertension Sister   . Hyperlipidemia Sister     Review of Systems: A 12-system review of systems was performed and was negative except as noted in the HPI.  --------------------------------------------------------------------------------------------------  Physical Exam: BP 120/70 (BP Location: Left Arm, Patient Position: Sitting, Cuff Size: Normal)   Pulse 77   Ht 5\' 10"  (1.778 m)   Wt 202 lb 2 oz (91.7 kg)   SpO2 (!) 77%   BMI 29.00 kg/m   General:  NAD. Neck: No JVD or HJR. Lungs: CTA bilaterally. Heart: RRR with 2/6 systolic murmur.  No rubs or gallops. Abd: Soft, NT/ND. Extremities: No LE edema.  Right calf ulcer covered with clean dressing.  Pedal pulses not palpable.  EKG:  NSR with RBBB and possible inferior infarct.  No significant change from prior tracing on 12/23/2019.  Lab Results  Component Value Date   WBC 7.6 12/06/2019   HGB 11.3 (L) 12/06/2019   HCT 34.8 (L) 12/06/2019   MCV 100.3 (H) 12/06/2019   PLT 172 12/06/2019    Lab Results  Component Value Date   NA 133 (L) 11/25/2019   K 4.5 11/25/2019   CL 101 11/25/2019   CO2 26 11/25/2019   BUN 14 11/25/2019   CREATININE 0.87 11/25/2019   GLUCOSE 92 11/25/2019   ALT 14 06/29/2019    Lab Results  Component Value Date   CHOL 136 06/29/2019   HDL 41 06/29/2019   LDLCALC 79 06/29/2019   TRIG 82 06/29/2019   CHOLHDL 3.3 06/29/2019   --------------------------------------------------------------------------------------------------  ASSESSMENT AND PLAN: Acute on chronic HFpEF: Raymond Barnett reports significant improvement in his dyspnea following addition of furosemide last week.   He appears euvolemic on exam today.  We will check a BMP today and determine based on the results if he can continue furosemide 40 mg daily or needs dose reduction.  I suspect he will need to remain on chronic diuretic therapy.  I encouraged him to minimize his sodium intake.  Stable angina: I suspect Raymond Barnett chest discomfort is ischemic in nature.  EKG again suggests inferior infarct.  We discussed role of ischemia evaluation (both invasive and non-invasive), but Raymond Barnett again declines further testing.  I have recommended continuation of aspirin 81 mg daily.  If symptoms worsen, trial of low-dose beta-blocker or long-acting nitrate could be considered.  PAD with critical limb ischemia: Right calf wound remains slow to  heal, with prior ABI's suggesting significant PAD in the right lower extremity.  Raymond Barnett denies claudication and does not wish to pursue further testing.  He should continue low dose aspirin.  He is not currently on statin therapy, though LDL is quite good at 79.  He previously self-discontinued statin therapy and did not wish to restart therapy.  Follow-up: Return to clinic in 3 months.  Nelva Bush, MD 12/29/2019 11:47 AM

## 2019-12-30 ENCOUNTER — Encounter: Payer: Self-pay | Admitting: Internal Medicine

## 2019-12-30 ENCOUNTER — Telehealth: Payer: Self-pay | Admitting: *Deleted

## 2019-12-30 LAB — BASIC METABOLIC PANEL
BUN/Creatinine Ratio: 25 — ABNORMAL HIGH (ref 10–24)
BUN: 22 mg/dL (ref 8–27)
CO2: 26 mmol/L (ref 20–29)
Calcium: 9.2 mg/dL (ref 8.6–10.2)
Chloride: 99 mmol/L (ref 96–106)
Creatinine, Ser: 0.88 mg/dL (ref 0.76–1.27)
GFR calc Af Amer: 89 mL/min/{1.73_m2} (ref >59)
GFR calc non Af Amer: 77 mL/min/{1.73_m2} (ref >59)
Glucose: 121 mg/dL — ABNORMAL HIGH (ref 65–99)
Potassium: 4.5 mmol/L (ref 3.5–5.2)
Sodium: 136 mmol/L (ref 134–144)

## 2019-12-30 MED ORDER — FUROSEMIDE 20 MG PO TABS: 20.0000 mg | ORAL_TABLET | Freq: Every day | ORAL | 3 refills | Status: AC

## 2019-12-30 NOTE — Telephone Encounter (Signed)
Results called to pt. Pt verbalized understanding of results and recommendations. Med list updated.

## 2019-12-30 NOTE — Telephone Encounter (Signed)
-----   Message from Nelva Bush, MD sent at 12/30/2019  2:10 PM EDT ----- Please let [name] know that his kidney function and electrolytes remain normal, though BUN is trending up.  I suggest that he decrease furosemide to 20 mg daily.  If he starts becoming short of breath again or develops leg edema, he should increase furosemide back to 40 mg daily and let us know.

## 2020-01-07 ENCOUNTER — Telehealth: Payer: Self-pay | Admitting: Family Medicine

## 2020-01-07 NOTE — Telephone Encounter (Addendum)
Yes. It should have been faxed.  Here is copy from my last office visit note with him where I completed his forms.  Saw him on 12/29/19  Form was ready to be faxed 12/30/19  Also the office visit note from 12/29/19 was printed and faxed.  Orders signed and to be faxed 12/30/19 back to [facility] - fax [phone] for processing.  That is the fax number we should have sent it to. Perhaps can you check if we still have the form and maybe they have a different number?  Nobie Putnam, [facility] 01/07/2020, 12:09 PM

## 2020-01-07 NOTE — Telephone Encounter (Signed)
Faxed on 01/07/2020.

## 2020-01-07 NOTE — Telephone Encounter (Signed)
Raymond Barnett. Calling from [employer]. Calling to ask was a order form received for [employer] wheel chair accesories? Form needs to be completed and returned back. Please advise [phone]- [phone]

## 2020-01-15 DIAGNOSIS — Z23 Encounter for immunization: Secondary | ICD-10-CM | POA: Diagnosis not present

## 2020-02-23 ENCOUNTER — Ambulatory Visit: Payer: Medicare Other | Admitting: Internal Medicine

## 2020-02-29 DIAGNOSIS — H401131 Primary open-angle glaucoma, bilateral, mild stage: Secondary | ICD-10-CM | POA: Diagnosis not present

## 2020-03-02 ENCOUNTER — Ambulatory Visit: Payer: Medicare Other | Admitting: Pulmonary Disease

## 2020-03-02 ENCOUNTER — Telehealth: Payer: Self-pay | Admitting: Pulmonary Disease

## 2020-03-02 ENCOUNTER — Emergency Department: Payer: Medicare Other

## 2020-03-02 ENCOUNTER — Inpatient Hospital Stay
Admission: EM | Admit: 2020-03-02 | Discharge: 2020-03-07 | DRG: 981 | Disposition: A | Discharge status: Skilled Nursing Facility | Payer: Medicare Other | Attending: Internal Medicine | Admitting: Internal Medicine

## 2020-03-02 ENCOUNTER — Other Ambulatory Visit: Payer: Self-pay

## 2020-03-02 DIAGNOSIS — R350 Frequency of micturition: Secondary | ICD-10-CM | POA: Diagnosis present

## 2020-03-02 DIAGNOSIS — Z8249 Family history of ischemic heart disease and other diseases of the circulatory system: Secondary | ICD-10-CM

## 2020-03-02 DIAGNOSIS — R490 Dysphonia: Secondary | ICD-10-CM | POA: Diagnosis present

## 2020-03-02 DIAGNOSIS — G8929 Other chronic pain: Secondary | ICD-10-CM | POA: Diagnosis present

## 2020-03-02 DIAGNOSIS — M1612 Unilateral primary osteoarthritis, left hip: Secondary | ICD-10-CM | POA: Diagnosis not present

## 2020-03-02 DIAGNOSIS — R262 Difficulty in walking, not elsewhere classified: Secondary | ICD-10-CM

## 2020-03-02 DIAGNOSIS — Z87891 Personal history of nicotine dependence: Secondary | ICD-10-CM | POA: Diagnosis not present

## 2020-03-02 DIAGNOSIS — J449 Chronic obstructive pulmonary disease, unspecified: Secondary | ICD-10-CM

## 2020-03-02 DIAGNOSIS — Z96641 Presence of right artificial hip joint: Secondary | ICD-10-CM | POA: Diagnosis not present

## 2020-03-02 DIAGNOSIS — G4700 Insomnia, unspecified: Secondary | ICD-10-CM | POA: Diagnosis not present

## 2020-03-02 DIAGNOSIS — S72011A Unspecified intracapsular fracture of right femur, initial encounter for closed fracture: Secondary | ICD-10-CM | POA: Diagnosis not present

## 2020-03-02 DIAGNOSIS — J9611 Chronic respiratory failure with hypoxia: Secondary | ICD-10-CM | POA: Diagnosis not present

## 2020-03-02 DIAGNOSIS — Z791 Long term (current) use of non-steroidal anti-inflammatories (NSAID): Secondary | ICD-10-CM

## 2020-03-02 DIAGNOSIS — S72041A Displaced fracture of base of neck of right femur, initial encounter for closed fracture: Secondary | ICD-10-CM | POA: Diagnosis not present

## 2020-03-02 DIAGNOSIS — I110 Hypertensive heart disease with heart failure: Principal | ICD-10-CM | POA: Diagnosis present

## 2020-03-02 DIAGNOSIS — I739 Peripheral vascular disease, unspecified: Secondary | ICD-10-CM | POA: Diagnosis present

## 2020-03-02 DIAGNOSIS — H409 Unspecified glaucoma: Secondary | ICD-10-CM | POA: Diagnosis present

## 2020-03-02 DIAGNOSIS — Z7951 Long term (current) use of inhaled steroids: Secondary | ICD-10-CM

## 2020-03-02 DIAGNOSIS — J438 Other emphysema: Secondary | ICD-10-CM | POA: Diagnosis not present

## 2020-03-02 DIAGNOSIS — M6281 Muscle weakness (generalized): Secondary | ICD-10-CM | POA: Diagnosis not present

## 2020-03-02 DIAGNOSIS — J398 Other specified diseases of upper respiratory tract: Secondary | ICD-10-CM | POA: Diagnosis present

## 2020-03-02 DIAGNOSIS — E049 Nontoxic goiter, unspecified: Secondary | ICD-10-CM | POA: Diagnosis not present

## 2020-03-02 DIAGNOSIS — S72011D Unspecified intracapsular fracture of right femur, subsequent encounter for closed fracture with routine healing: Secondary | ICD-10-CM | POA: Diagnosis not present

## 2020-03-02 DIAGNOSIS — J439 Emphysema, unspecified: Secondary | ICD-10-CM | POA: Diagnosis not present

## 2020-03-02 DIAGNOSIS — I10 Essential (primary) hypertension: Secondary | ICD-10-CM | POA: Diagnosis present

## 2020-03-02 DIAGNOSIS — I4510 Unspecified right bundle-branch block: Secondary | ICD-10-CM | POA: Diagnosis present

## 2020-03-02 DIAGNOSIS — S72001S Fracture of unspecified part of neck of right femur, sequela: Secondary | ICD-10-CM | POA: Diagnosis not present

## 2020-03-02 DIAGNOSIS — Z419 Encounter for procedure for purposes other than remedying health state, unspecified: Secondary | ICD-10-CM

## 2020-03-02 DIAGNOSIS — Z471 Aftercare following joint replacement surgery: Secondary | ICD-10-CM | POA: Diagnosis not present

## 2020-03-02 DIAGNOSIS — I25118 Atherosclerotic heart disease of native coronary artery with other forms of angina pectoris: Secondary | ICD-10-CM | POA: Diagnosis not present

## 2020-03-02 DIAGNOSIS — Z79899 Other long term (current) drug therapy: Secondary | ICD-10-CM

## 2020-03-02 DIAGNOSIS — I5033 Acute on chronic diastolic (congestive) heart failure: Secondary | ICD-10-CM | POA: Diagnosis present

## 2020-03-02 DIAGNOSIS — M160 Bilateral primary osteoarthritis of hip: Secondary | ICD-10-CM | POA: Diagnosis present

## 2020-03-02 DIAGNOSIS — Z20822 Contact with and (suspected) exposure to covid-19: Secondary | ICD-10-CM | POA: Diagnosis present

## 2020-03-02 DIAGNOSIS — I2510 Atherosclerotic heart disease of native coronary artery without angina pectoris: Secondary | ICD-10-CM | POA: Diagnosis not present

## 2020-03-02 DIAGNOSIS — Z7982 Long term (current) use of aspirin: Secondary | ICD-10-CM

## 2020-03-02 DIAGNOSIS — S72001A Fracture of unspecified part of neck of right femur, initial encounter for closed fracture: Secondary | ICD-10-CM

## 2020-03-02 DIAGNOSIS — M25552 Pain in left hip: Secondary | ICD-10-CM | POA: Diagnosis not present

## 2020-03-02 DIAGNOSIS — G8918 Other acute postprocedural pain: Secondary | ICD-10-CM

## 2020-03-02 DIAGNOSIS — E785 Hyperlipidemia, unspecified: Secondary | ICD-10-CM | POA: Diagnosis present

## 2020-03-02 DIAGNOSIS — D649 Anemia, unspecified: Secondary | ICD-10-CM | POA: Diagnosis present

## 2020-03-02 DIAGNOSIS — Z96651 Presence of right artificial knee joint: Secondary | ICD-10-CM | POA: Diagnosis present

## 2020-03-02 DIAGNOSIS — R1310 Dysphagia, unspecified: Secondary | ICD-10-CM

## 2020-03-02 DIAGNOSIS — Z83438 Family history of other disorder of lipoprotein metabolism and other lipidemia: Secondary | ICD-10-CM | POA: Diagnosis not present

## 2020-03-02 DIAGNOSIS — Z66 Do not resuscitate: Secondary | ICD-10-CM | POA: Diagnosis present

## 2020-03-02 DIAGNOSIS — I5032 Chronic diastolic (congestive) heart failure: Secondary | ICD-10-CM

## 2020-03-02 DIAGNOSIS — M1990 Unspecified osteoarthritis, unspecified site: Secondary | ICD-10-CM | POA: Diagnosis not present

## 2020-03-02 DIAGNOSIS — R0602 Shortness of breath: Secondary | ICD-10-CM | POA: Diagnosis not present

## 2020-03-02 DIAGNOSIS — M810 Age-related osteoporosis without current pathological fracture: Secondary | ICD-10-CM | POA: Diagnosis not present

## 2020-03-02 DIAGNOSIS — S76011A Strain of muscle, fascia and tendon of right hip, initial encounter: Secondary | ICD-10-CM | POA: Diagnosis not present

## 2020-03-02 DIAGNOSIS — D696 Thrombocytopenia, unspecified: Secondary | ICD-10-CM | POA: Diagnosis present

## 2020-03-02 DIAGNOSIS — K5730 Diverticulosis of large intestine without perforation or abscess without bleeding: Secondary | ICD-10-CM | POA: Diagnosis not present

## 2020-03-02 DIAGNOSIS — Z800 Family history of malignant neoplasm of digestive organs: Secondary | ICD-10-CM

## 2020-03-02 DIAGNOSIS — Z01810 Encounter for preprocedural cardiovascular examination: Secondary | ICD-10-CM | POA: Diagnosis not present

## 2020-03-02 DIAGNOSIS — M545 Low back pain, unspecified: Secondary | ICD-10-CM | POA: Diagnosis not present

## 2020-03-02 DIAGNOSIS — R279 Unspecified lack of coordination: Secondary | ICD-10-CM | POA: Diagnosis not present

## 2020-03-02 DIAGNOSIS — N401 Enlarged prostate with lower urinary tract symptoms: Secondary | ICD-10-CM | POA: Diagnosis present

## 2020-03-02 DIAGNOSIS — J309 Allergic rhinitis, unspecified: Secondary | ICD-10-CM | POA: Diagnosis not present

## 2020-03-02 DIAGNOSIS — J90 Pleural effusion, not elsewhere classified: Secondary | ICD-10-CM | POA: Diagnosis not present

## 2020-03-02 DIAGNOSIS — M5136 Other intervertebral disc degeneration, lumbar region: Secondary | ICD-10-CM | POA: Diagnosis present

## 2020-03-02 DIAGNOSIS — R5381 Other malaise: Secondary | ICD-10-CM | POA: Diagnosis not present

## 2020-03-02 DIAGNOSIS — K219 Gastro-esophageal reflux disease without esophagitis: Secondary | ICD-10-CM | POA: Diagnosis not present

## 2020-03-02 LAB — URINALYSIS, COMPLETE (UACMP) WITH MICROSCOPIC
Bacteria, UA: NONE SEEN
Bilirubin Urine: NEGATIVE
Glucose, UA: NEGATIVE mg/dL
Hgb urine dipstick: NEGATIVE
Ketones, ur: NEGATIVE mg/dL
Leukocytes,Ua: NEGATIVE
Nitrite: NEGATIVE
Protein, ur: NEGATIVE mg/dL
Specific Gravity, Urine: 1.008 (ref 1.005–1.030)
pH: 6 (ref 5.0–8.0)

## 2020-03-02 LAB — CBC
HCT: 35.5 % — ABNORMAL LOW (ref 39.0–52.0)
Hemoglobin: 11.6 g/dL — ABNORMAL LOW (ref 13.0–17.0)
MCH: 32 pg (ref 26.0–34.0)
MCHC: 32.7 g/dL (ref 30.0–36.0)
MCV: 97.8 fL (ref 80.0–100.0)
Platelets: 158 10*3/uL (ref 150–400)
RBC: 3.63 MIL/uL — ABNORMAL LOW (ref 4.22–5.81)
RDW: 21.5 % — ABNORMAL HIGH (ref 11.5–15.5)
WBC: 9.9 10*3/uL (ref 4.0–10.5)
nRBC: 1.1 % — ABNORMAL HIGH (ref 0.0–0.2)

## 2020-03-02 LAB — BASIC METABOLIC PANEL
Anion gap: 11 (ref 5–15)
BUN: 19 mg/dL (ref 8–23)
CO2: 25 mmol/L (ref 22–32)
Calcium: 8.8 mg/dL — ABNORMAL LOW (ref 8.9–10.3)
Chloride: 96 mmol/L — ABNORMAL LOW (ref 98–111)
Creatinine, Ser: 0.84 mg/dL (ref 0.61–1.24)
GFR, Estimated: >60 mL/min (ref >60)
Glucose, Bld: 104 mg/dL — ABNORMAL HIGH (ref 70–99)
Potassium: 4.4 mmol/L (ref 3.5–5.1)
Sodium: 132 mmol/L — ABNORMAL LOW (ref 135–145)

## 2020-03-02 MED ORDER — MORPHINE SULFATE (PF) 2 MG/ML IV SOLN
0.5000 mg | INTRAVENOUS | Status: DC | PRN
  Administered 2020-03-03: 0.5 mg via INTRAVENOUS
  Filled 2020-03-02 (×2): qty 1

## 2020-03-02 MED ORDER — FUROSEMIDE 20 MG PO TABS: 20.0000 mg | ORAL_TABLET | Freq: Every day | ORAL | Status: DC

## 2020-03-02 MED ORDER — TRAZODONE HCL 100 MG PO TABS: 100.0000 mg | ORAL_TABLET | Freq: Every day | ORAL | Status: DC

## 2020-03-02 MED ORDER — LISINOPRIL 5 MG PO TABS: 5.0000 mg | ORAL_TABLET | Freq: Every day | ORAL | Status: DC

## 2020-03-02 MED ORDER — ONDANSETRON 4 MG PO TBDP: 4.0000 mg | ORAL_TABLET | Freq: Once | ORAL | Status: AC

## 2020-03-02 MED ORDER — TRAZODONE HCL 50 MG PO TABS: 50.0000 mg | ORAL_TABLET | Freq: Every evening | ORAL | Status: DC | PRN

## 2020-03-02 MED ORDER — MORPHINE SULFATE (PF) 4 MG/ML IV SOLN
4.0000 mg | Freq: Once | INTRAVENOUS | Status: AC
  Administered 2020-03-02: 4 mg via INTRAMUSCULAR
  Filled 2020-03-02: qty 1

## 2020-03-02 MED ORDER — ONDANSETRON 4 MG PO TBDP
ORAL_TABLET | ORAL | Status: AC
  Administered 2020-03-02: 4 mg via ORAL
  Filled 2020-03-02: qty 1

## 2020-03-02 MED ORDER — BUDESON-GLYCOPYRROL-FORMOTEROL 160-9-4.8 MCG/ACT IN AERO: 2.0000 | INHALATION_SPRAY | Freq: Two times a day (BID) | RESPIRATORY_TRACT | Status: DC

## 2020-03-02 MED ORDER — FINASTERIDE 5 MG PO TABS: 5.0000 mg | ORAL_TABLET | Freq: Every day | ORAL | Status: DC

## 2020-03-02 MED ORDER — ENOXAPARIN SODIUM 40 MG/0.4ML ~~LOC~~ SOLN: 40.0000 mg | SUBCUTANEOUS | Status: DC

## 2020-03-02 MED ORDER — HYDROCODONE-ACETAMINOPHEN 5-325 MG PO TABS: 1.0000 | ORAL_TABLET | Freq: Four times a day (QID) | ORAL | Status: DC | PRN

## 2020-03-02 MED ORDER — ONDANSETRON 4 MG PO TBDP
4.0000 mg | ORAL_TABLET | Freq: Once | ORAL | Status: AC
  Administered 2020-03-02: 4 mg via ORAL
  Filled 2020-03-02: qty 1

## 2020-03-02 NOTE — ED Triage Notes (Signed)
Pt arrives to ed via pov. Pt c/o sob, generalized weakness and inability to complete ADL. Pt states symptoms started 3-4 weeks ago and has gradually become worse. Pt reports becoming anxious at time and feels this increases his SOB. Pt also c/o left hip pain. NAD noted at this time

## 2020-03-02 NOTE — ED Notes (Signed)
Pt endorsing nausea 

## 2020-03-02 NOTE — ED Notes (Signed)
PT to MRI with family and MRI tech via wheelchair

## 2020-03-02 NOTE — ED Provider Notes (Signed)
[facility] Emergency Department Provider Note  Time seen: 3:39 PM  I have reviewed the triage vital signs and the nursing notes.   HISTORY  Chief Complaint Fatigue, Shortness of Breath, and Hip Pain   HPI Raymond Barnett is a 86 y.o. male with a past medical history of COPD, hyperlipidemia, hypertension, presents to the emergency department with left leg pain and shortness of breath.  Patient is here with his daughter.  States the leg pain has been an ongoing issue over the past several months but getting much worse to the point where he can no longer walk due to pain in his left hip/pelvis.  Patient also states intermittent shortness of breath most of this comes with exertion when he is trying to get up out of his chair or walk.  Patient has an electric wheelchair he can use at home has not been walking as much due to pain in his left hip.  Patient has been experiencing shortness of breath over the past several months, has seen his doctor has been started on Lasix which he is taking daily.  Daughter believes most of the shortness of breath is due to anxiety which is also a fairly recent issue for the patient as well.  Patient denies any chest pain.  Does state a slight cough.  Denies any fever.   Past Medical History:  Diagnosis Date  . (HFpEF) heart failure with improved ejection fraction ([place])    a. 08/2017 Echo: EF 45-50%; b. 10/2019 Echo: EF 55-60%. No rwma, mild LVH, Gr2 DD, RVSP 47.56mHg. Mod dil LA. Mod MR. Asc Ao 424m  . Arthritis   . Benign prostate hyperplasia    lower urinary tract symptoms  . Cataracts, bilateral   . COPD (chronic obstructive pulmonary disease) ([place]  . DDD (degenerative disc disease), lumbar   . Glaucoma   . H/O carpal tunnel syndrome   . History of osteomyelitis 1953   right leg  . Hypertension   . Moderate mitral regurgitation    a. 10/2019 Echo: Mod MR.  . Marland KitchenAD (peripheral artery disease) ([place]   a. 08/2017 LE ABI & Duplex: ABI R  0.60, L 1.07. 30-49% R popliteal and 50-74% R PT stenoses.  . [facility]    Patient Active Problem List   Diagnosis Date Noted  . Primary osteoarthritis of both knees 12/29/2019  . Status post total right knee replacement 12/29/2019  . Macrocytic anemia 12/21/2019  . Critical lower limb ischemia ([facility]12/13/2019  . Chronic heart failure with preserved ejection fraction ([place]12/13/2019  . Allergic rhinitis due to allergen 12/17/2017  . Ankle wound, right, sequela 09/17/2017  . Dyspnea on exertion 07/31/2017  . Coronary artery calcification 07/31/2017  . Claudication ([facility]04/02/2018  . PAD (peripheral artery disease) ([facility]04/02/2018  . Hyperlipidemia LDL goal <70 07/31/2017  . Aortic atherosclerosis ([facility]04/02/2018  . Hemorrhoids 06/18/2017  . Paraseptal emphysema ([facility]CE[place]02/27/2019  . Benign prostatic hyperplasia with urinary frequency 05/06/2017  . Lung density on x-ray 09/03/2016  . DDD (degenerative disc disease), lumbar 09/02/2016  . Elevated blood sugar 03/05/2016  . Peripheral neuropathy 01/23/2016  . Chronic bilateral low back pain without sciatica 05/18/2015  . Essential hypertension 02/13/2015  . Chronic fatigue 02/13/2015  . Seasonal allergies 02/13/2015    Past Surgical History:  Procedure Laterality Date  . CARPAL TUNNEL RELEASE Left   . CARPAL TUNNEL RELEASE Right   . COLONOSCOPY  2015   Dr. [name].  LEG SURGERY Right 1953  . SKIN GRAFT Right 05/01/2017   right foot at [facility]  . TOTAL KNEE ARTHROPLASTY Right     Prior to Admission medications   Medication Sig Start Date End Date Taking? Authorizing Provider  ASPIRIN LOW DOSE 81 MG chewable tablet Chew 81 mg by mouth daily. 07/17/19   [provider]  Budeson-Glycopyrrol-Formoterol (BREZTRI AEROSPHERE) 160-9-4.8 MCG/ACT AERO Inhale 2 puffs into the lungs 2 (two) times daily. 11/25/19   Tyler Pita, MD  budesonide-formoterol [facility]) 160-4.5 MCG/ACT inhaler Inhale 2 puffs into the  lungs 2 (two) times daily.  Patient not taking: Reported on 12/29/2019    [provider]  Calcium Carb-Cholecalciferol (CALCIUM 600 + D) 600-200 MG-UNIT TABS Take 1 tablet by mouth daily. Patient takes 1000 mg daily.    [provider]  cholecalciferol (VITAMIN D) 25 MCG (1000 UNIT) tablet Take 1,000 Units by mouth daily. 12/15/19   [provider]  finasteride (PROSCAR) 5 MG tablet Take 5 mg by mouth daily.    [provider]  fluticasone (FLONASE) 50 MCG/ACT nasal spray Place 2 sprays into both nostrils daily. Use for 4-6 weeks then stop and use seasonally or as needed. 11/09/19   [name], [name], DO  furosemide (LASIX) 20 MG tablet Take 1 tablet (20 mg total) by mouth daily. 12/30/19 03/29/20  End, [name], MD  Lidocaine-Hydrocortisone Ace 2-2 % KIT Use rectal suppository twice daily as needed for up to 1 week for hemorrhoids 06/18/17   [name], DO  lisinopril (ZESTRIL) 5 MG tablet Take 5 mg by mouth daily.    [provider]  loratadine (CLARITIN) 10 MG tablet Take 1 tablet (10 mg total) by mouth daily. 11/09/19   [name], [name], DO  LUBRICATING PLUS EYE DROPS 0.5 % SOLN as needed.  08/25/19   [provider]  melatonin 5 MG TABS Take 5 mg by mouth at bedtime.  Patient not taking: Reported on 12/29/2019    [provider]  naproxen (NAPROSYN) 500 MG tablet Take 1 tablet (500 mg total) by mouth 2 (two) times daily as needed. 11/16/19   [name], [name], DO  timolol (TIMOPTIC) 0.5 % ophthalmic solution 1 drop 2 (two) times daily.    [provider]  traZODone (DESYREL) 100 MG tablet Take 1 tablet (100 mg total) by mouth at bedtime. 12/22/19   [name], [name], DO    No Known Allergies  Family History  Problem Relation Age of Onset  . Colon cancer Mother 60  . Thrombosis Father 62  . Heart disease Father        Heart valve problem  . AAA (abdominal aortic aneurysm) Father   .  Hypertension Sister   . Hyperlipidemia Sister   . Hypertension Sister   . Hyperlipidemia Sister   . Hypertension Sister   . Hyperlipidemia Sister     Social History Social History   Tobacco Use  . Smoking status: Former Smoker    Packs/day: 1.00    Years: 30.00    Pack years: 30.00    Types: Cigarettes    Quit date: 1995    Years since quitting: 26.8  . Smokeless tobacco: Former Network engineer  . Vaping Use: Never used  Substance Use Topics  . Alcohol use: Not Currently    Comment: occasionally wine   . Drug use: No    Review of Systems Constitutional: Negative for fever. Cardiovascular: Negative for chest pain. Respiratory: Intermittent shortness of  breath.  Occasional cough. Gastrointestinal: Negative for abdominal pain, vomiting Genitourinary: Negative for urinary compaints Musculoskeletal: Left hip/pelvis pain Neurological: Negative for headache All other ROS negative  ____________________________________________   PHYSICAL EXAM:  VITAL SIGNS: ED Triage Vitals  Enc Vitals Group     BP 03/02/20 1208 (!) 157/84     Pulse Rate 03/02/20 1208 88     Resp 03/02/20 1208 (!) 22     Temp 03/02/20 1208 98.3 F (36.8 C)     Temp Source 03/02/20 1208 Oral     SpO2 03/02/20 1208 91 %     Weight 03/02/20 1208 205 lb (93 kg)     Height 03/02/20 1208 '5\' 10"'  (1.778 m)     Head Circumference --      Peak Flow --      Pain Score 03/02/20 1217 0     Pain Loc --      Pain Edu? --      Excl. in [place]? --     Constitutional: Alert and oriented. Well appearing and in no distress. Eyes: Normal exam ENT      Head: Normocephalic and atraumatic.      Mouth/Throat: Mucous membranes are moist. Cardiovascular: Normal rate, regular rhythm.  Respiratory: Normal respiratory effort without tachypnea nor retractions. Breath sounds are clear Gastrointestinal: Soft and nontender. No distention. Musculoskeletal: Mild tenderness palpation of the left hip/pelvis.  Patient has limited  range of motion due to pain in the hip.  Appears to be neurovascular intact.  Patient does have 1+ lower extreme edema left lower extremity slightly less than the right lower extremity right lower extremity has a history of trauma with bone grafting many years ago. Neurologic:  Normal speech and language. No gross focal neurologic deficits Skin:  Skin is warm, dry Psychiatric: Mood and affect are normal.  ____________________________________________    EKG  EKG viewed and interpreted by myself shows a normal sinus rhythm 88 bpm with a slightly widened QRS, normal axis, normal intervals, nonspecific ST changes.  ____________________________________________    RADIOLOGY  Hip x-ray shows 3.3 x 8.8 cm bony growth over the left iliac crest.  We will proceed with CT to further evaluate. Chest x-ray shows small bilateral pleural effusions, mild cardiomegaly and vascular congestion.  ____________________________________________   INITIAL IMPRESSION / ASSESSMENT AND PLAN / ED COURSE  Pertinent labs & imaging results that were available during my care of the patient were reviewed by me and considered in my medical decision making (see chart for details).   Patient presents to the emergency department for left hip/leg pain as well as shortness of breath.  Overall patient appears well, sitting in wheelchair states he does not want to or cannot lie in bed due to hip pain.  We'll dose pain medication.  Given the x-ray findings of this bony growth on the left iliac crest we will obtain a CT scan to further evaluate and help rule out oncologic cause.  Patient agreeable to plan of care.  The shortness of breath issue appears to be more of a chronic issue in talking to the daughter states that is mostly with recent anxiety issues, but also somewhat with exertion.  X-ray does show small bilateral pleural effusions which could be contributing to shortness of breath patient is currently taking Lasix on a daily  basis.  Lab work is otherwise largely nonrevealing.  CT scan shows what appears to be a possible right hip fracture.  I spoke to [name] of orthopedic who  has reviewed the CT.  He is requesting an MRI to further evaluate.  I spoke to the patient and daughter they are agreeable to this plan as well.  Patient did desat into the upper 80s following morphine but currently satting 91 to 92% on room air.  Patient has as needed oxygen that he uses at night at home.  MRI shows acute impacted subcapital right femoral neck fracture.  We will discuss with orthopedics.  I anticipate likely admission to the hospital.  Spoke to [name] of orthopedics who recommends admission likely operation tomorrow.  Raymond Barnett was evaluated in Emergency Department on 03/02/2020 for the symptoms described in the history of present illness. He was evaluated in the context of the global COVID-19 pandemic, which necessitated consideration that the patient might be at risk for infection with the SARS-CoV-2 virus that causes COVID-19. Institutional protocols and algorithms that pertain to the evaluation of patients at risk for COVID-19 are in a state of rapid change based on information released by regulatory bodies including the CDC and federal and state organizations. These policies and algorithms were followed during the patient's care in the ED.  ____________________________________________   FINAL CLINICAL IMPRESSION(S) / ED DIAGNOSES  Left hip pain Shortness of breath Right hip fracture   Harvest Dark, MD 03/02/20 2255

## 2020-03-02 NOTE — H&P (Addendum)
History and Physical    Raymond Barnett:[id number] DOB: [birth year] DOA: 03/02/2020  PCP: [name], DO   Patient coming from: Home  I have personally briefly reviewed patient's old medical records in [place]  Chief Complaint: Right hip pain  HPI: Raymond Barnett is a 86 y.o. male with medical history significant for COPD, chronic diastolic heart failure, chronic anemia, HLD, HTN, chronic osteoarthritis and low back pain, BPH and glaucoma, who at baseline has limited ambulation, using motorized wheelchair prescribed by his provider for the past 2 months was brought into the emergency room at the recommendation of his PCP due to a complaint of persistent and left hip pain which has acutely worsened his ability to ambulate.  He denies any recent history of falls.  The pain has been gradually worsening.  He also complains of increasing shortness of breath, lower extremity edema, and dyspnea on exertion  Also has noted difficulty swallowing and hoarseness over the past month or so.  Patient notes improvement with Lasix.  He denies chest pain, no he was seen by his cardiologist, [name] 9/21 when he complained of chest pressure but declined recommendation for an ischemic work-up at that time. ED Course: On arrival in the emergency room he was afebrile, slightly tachycardic at 22 with O2 sat 91% on room air.  BP 157/84.  Blood work significant for hemoglobin of 11.6 but this is his baseline for his chronic anemia, chemistries significant for sodium of 132 but otherwise unremarkable.  Urinalysis was normal. EKG as reviewed by me : NSR rate of 88 with RBBB, possible inferior infarct, unchanged from prior Chest x-ray showed left tracheal deviation suspect related to enlarged thyroid Patient had left hip x-ray but then went on to have CT pelvis followed by MRI pelvis that showed right impacted subcapital femoral neck fracture. The emergency room provider spoke with [name] who will  take patient to the OR on 03/04/2019.  Review of Systems: As per HPI otherwise all other systems on review of systems negative.    Past Medical History:  Diagnosis Date  . (HFpEF) heart failure with improved ejection fraction ([place])    a. 08/2017 Echo: EF 45-50%; b. 10/2019 Echo: EF 55-60%. No rwma, mild LVH, Gr2 DD, RVSP 47.81mHg. Mod dil LA. Mod MR. Asc Ao 490m  . Arthritis   . Benign prostate hyperplasia    lower urinary tract symptoms  . Cataracts, bilateral   . COPD (chronic obstructive pulmonary disease) ([place]  . DDD (degenerative disc disease), lumbar   . Glaucoma   . H/O carpal tunnel syndrome   . History of osteomyelitis 1953   right leg  . Hypertension   . Moderate mitral regurgitation    a. 10/2019 Echo: Mod MR.  . Marland KitchenAD (peripheral artery disease) ([place]   a. 08/2017 LE ABI & Duplex: ABI R 0.60, L 1.07. 30-49% R popliteal and 50-74% R PT stenoses.  . Thrombocytopenia ([facility]    Past Surgical History:  Procedure Laterality Date  . CARPAL TUNNEL RELEASE Left   . CARPAL TUNNEL RELEASE Right   . COLONOSCOPY  2015   Dr. [name]. LEG SURGERY Right 1953  . SKIN GRAFT Right 05/01/2017   [facility] TOTAL KNEE ARTHROPLASTY Right      reports that he quit smoking about 26 years ago. His smoking use included cigarettes. He has a 30.00 pack-year smoking history. He has quit using smokeless tobacco. He  reports previous alcohol use. He reports that he does not use drugs.  No Known Allergies  Family History  Problem Relation Age of Onset  . Colon cancer Mother 70  . Thrombosis Father 69  . Heart disease Father        Heart valve problem  . AAA (abdominal aortic aneurysm) Father   . Hypertension Sister   . Hyperlipidemia Sister   . Hypertension Sister   . Hyperlipidemia Sister   . Hypertension Sister   . Hyperlipidemia Sister       Prior to Admission medications   Medication Sig Start Date End Date Taking? Authorizing Provider  ASPIRIN LOW DOSE 81 MG  chewable tablet Chew 81 mg by mouth daily. 07/17/19   [provider]  Budeson-Glycopyrrol-Formoterol (BREZTRI AEROSPHERE) 160-9-4.8 MCG/ACT AERO Inhale 2 puffs into the lungs 2 (two) times daily. 11/25/19   Tyler Pita, MD  budesonide-formoterol [facility]) 160-4.5 MCG/ACT inhaler Inhale 2 puffs into the lungs 2 (two) times daily.  Patient not taking: Reported on 12/29/2019    [provider]  Calcium Carb-Cholecalciferol (CALCIUM 600 + D) 600-200 MG-UNIT TABS Take 1 tablet by mouth daily. Patient takes 1000 mg daily.    [provider]  cholecalciferol (VITAMIN D) 25 MCG (1000 UNIT) tablet Take 1,000 Units by mouth daily. 12/15/19   [provider]  finasteride (PROSCAR) 5 MG tablet Take 5 mg by mouth daily.    [provider]  fluticasone (FLONASE) 50 MCG/ACT nasal spray Place 2 sprays into both nostrils daily. Use for 4-6 weeks then stop and use seasonally or as needed. 11/09/19   [name], [name], DO  furosemide (LASIX) 20 MG tablet Take 1 tablet (20 mg total) by mouth daily. 12/30/19 03/29/20  End, [name], MD  Lidocaine-Hydrocortisone Ace 2-2 % KIT Use rectal suppository twice daily as needed for up to 1 week for hemorrhoids 06/18/17   [name], DO  lisinopril (ZESTRIL) 5 MG tablet Take 5 mg by mouth daily.    [provider]  loratadine (CLARITIN) 10 MG tablet Take 1 tablet (10 mg total) by mouth daily. 11/09/19   [name], [name], DO  LUBRICATING PLUS EYE DROPS 0.5 % SOLN as needed.  08/25/19   [provider]  melatonin 5 MG TABS Take 5 mg by mouth at bedtime.  Patient not taking: Reported on 12/29/2019    [provider]  naproxen (NAPROSYN) 500 MG tablet Take 1 tablet (500 mg total) by mouth 2 (two) times daily as needed. 11/16/19   [name], [name], DO  timolol (TIMOPTIC) 0.5 % ophthalmic solution 1 drop 2 (two) times daily.    [provider]  traZODone (DESYREL) 100 MG  tablet Take 1 tablet (100 mg total) by mouth at bedtime. 12/22/19   [name], DO    Physical Exam: Vitals:   03/02/20 1800 03/02/20 1900 03/02/20 1930 03/02/20 2136  BP: 102/88 110/70 103/61 110/60  Pulse: 81 90 83 80  Resp: 18   20  Temp:      TempSrc:      SpO2: 96% (!) 87% 90% 96%  Weight:      Height:         Vitals:   03/02/20 1800 03/02/20 1900 03/02/20 1930 03/02/20 2136  BP: 102/88 110/70 103/61 110/60  Pulse: 81 90 83 80  Resp: 18   20  Temp:      TempSrc:      SpO2: 96% (!) 87% 90% 96%  Weight:      Height:          Constitutional: Alert and oriented x 3 . Not in any apparent distress HEENT:      Head: Normocephalic and atraumatic.         Eyes: PERLA, EOMI, Conjunctivae are normal. Sclera is non-icteric.       Mouth/Throat: Mucous membranes are moist.       Neck: Supple with no signs of meningismus. Cardiovascular: Regular rate and rhythm. No murmurs, gallops, or rubs. 2+ symmetrical distal pulses are present . No JVD. 2+ LE edema Respiratory: Respiratory effort slightly increased.Lungs sounds diminished bibasilarly bilaterally. No wheezes, crackles, or rhonchi.  Gastrointestinal: Soft, non tender, and non distended with positive bowel sounds. No rebound or guarding. Genitourinary: No CVA tenderness. Musculoskeletal:  Limited range of motion of hips due to hip pain. 2+ edema bilaterally.  Neurologic:  Face is symmetric. Moving all extremities. No gross focal neurologic deficits . Skin: Skin is warm, dry.  No rash or ulcers Psychiatric: Mood and affect are normal    Labs on Admission: I have personally reviewed following labs and imaging studies  CBC: Recent Labs  Lab 03/02/20 1228  WBC 9.9  HGB 11.6*  HCT 35.5*  MCV 97.8  PLT 703   Basic Metabolic Panel: Recent Labs  Lab 03/02/20 1228  NA 132*  K 4.4  CL 96*  CO2 25  GLUCOSE 104*  BUN 19  CREATININE 0.84  CALCIUM 8.8*   GFR: Estimated Creatinine Clearance: 68.9 mL/min  (by C-G formula based on SCr of 0.84 mg/dL). Liver Function Tests: No results for input(s): AST, ALT, ALKPHOS, BILITOT, PROT, ALBUMIN in the last 168 hours. No results for input(s): LIPASE, AMYLASE in the last 168 hours. No results for input(s): AMMONIA in the last 168 hours. Coagulation Profile: No results for input(s): INR, PROTIME in the last 168 hours. Cardiac Enzymes: No results for input(s): CKTOTAL, CKMB, CKMBINDEX, TROPONINI in the last 168 hours. BNP (last 3 results) No results for input(s): PROBNP in the last 8760 hours. HbA1C: No results for input(s): HGBA1C in the last 72 hours. CBG: No results for input(s): GLUCAP in the last 168 hours. Lipid Profile: No results for input(s): CHOL, HDL, LDLCALC, TRIG, CHOLHDL, LDLDIRECT in the last 72 hours. Thyroid Function Tests: No results for input(s): TSH, T4TOTAL, FREET4, T3FREE, THYROIDAB in the last 72 hours. Anemia Panel: No results for input(s): VITAMINB12, FOLATE, FERRITIN, TIBC, IRON, RETICCTPCT in the last 72 hours. Urine analysis:    Component Value Date/Time   COLORURINE YELLOW (A) 03/02/2020 1228   APPEARANCEUR CLEAR (A) 03/02/2020 1228   APPEARANCEUR Clear 12/12/2011 1211   LABSPEC 1.008 03/02/2020 1228   LABSPEC 1.006 12/12/2011 1211   PHURINE 6.0 03/02/2020 1228   GLUCOSEU NEGATIVE 03/02/2020 1228   GLUCOSEU Negative 12/12/2011 1211   HGBUR NEGATIVE 03/02/2020 1228   BILIRUBINUR NEGATIVE 03/02/2020 1228   BILIRUBINUR negative 09/16/2017 1219   BILIRUBINUR Negative 12/12/2011 1211   KETONESUR NEGATIVE 03/02/2020 1228   PROTEINUR NEGATIVE 03/02/2020 1228   UROBILINOGEN 0.2 09/16/2017 1219   NITRITE NEGATIVE 03/02/2020 1228   LEUKOCYTESUR NEGATIVE 03/02/2020 1228   LEUKOCYTESUR Negative 12/12/2011 1211    Radiological Exams on Admission: DG Chest 2 View  Result Date: 03/02/2020 CLINICAL DATA:  Shortness of breath EXAM: CHEST - 2 VIEW COMPARISON:  October 12, 2019 FINDINGS: There is a small pleural effusion on  each side with mild bibasilar atelectasis. The lungs elsewhere are clear. There is cardiomegaly with pulmonary  vascularity normal. No adenopathy. There is aortic atherosclerosis. There is leftward deviation of the upper thoracic trachea. Bones appear osteoporotic. IMPRESSION: 1. Small pleural effusions bilaterally with bibasilar atelectasis. Lungs elsewhere clear. 2.  Cardiomegaly with pulmonary vascularity normal. 3. Deviation of the upper thoracic trachea toward the left. Question thyroid enlargement as etiology for this finding. 4.  Aortic Atherosclerosis (ICD10-I70.0). Electronically Signed   By: Lowella Grip III M.D.   On: 03/02/2020 13:03   CT PELVIS WO CONTRAST  Result Date: 03/02/2020 CLINICAL DATA:  Hip pain for 10 days EXAM: CT PELVIS WITHOUT CONTRAST TECHNIQUE: Multidetector CT imaging of the pelvis was performed following the standard protocol without intravenous contrast. COMPARISON:  Radiograph 03/02/2020 FINDINGS: Urinary Tract:  Urinary bladder is unremarkable. Bowel: Sigmoid colon diverticula without acute inflammatory process. Negative appendix. No bowel wall thickening Vascular/Lymphatic: Extensive aortic atherosclerosis without aneurysm. No suspicious nodes Reproductive:  Mild prostate calcification without enlargement Other:  Negative for pelvic effusion Musculoskeletal: Mild degenerative changes of both hips with joint space narrowing and asymmetric femoral head neck junction. Chronic appearing deformity at the left iliac crest potentially due to remote trauma or postsurgical change. There is no fracture or associated soft tissue mass. There is fatty atrophy of adjacent gluteus musculature. No femoral head dislocation. Coronal images are suspicious for acute appearing nondisplaced right femoral neck fracture. Pubic symphysis and rami appear intact. IMPRESSION: 1. No definite acute osseous abnormality of left hip. Chronic appearing osseous deformity of left iliac crest without evidence  for bony destructive change or associated soft tissue mass. 2. Findings are suspicious for an acute nondisplaced right femoral neck fracture 3. Sigmoid colon diverticular disease without acute inflammatory change. Aortic Atherosclerosis (ICD10-I70.0). Electronically Signed   By: Donavan Foil M.D.   On: 03/02/2020 16:33   MR PELVIS WO CONTRAST  Result Date: 03/02/2020 CLINICAL DATA:  Right hip pain EXAM: MRI PELVIS WITHOUT CONTRAST TECHNIQUE: Multiplanar multisequence MR imaging of the pelvis was performed. No intravenous contrast was administered. COMPARISON:  CT 3:54 p.m., plain radiographs 12:36 p.m. FINDINGS: Urinary Tract: The distal ureters are decompressed. The bladder is unremarkable. Bowel: The visualized large and small bowel are unremarkable per save for severe sigmoid diverticulosis. Vascular/Lymphatic: No pathologic adenopathy within the abdomen and pelvis Reproductive:  No mass or other significant abnormality Other:  None significant Musculoskeletal: There is an acute, impacted subcapital right femoral neck fracture with mild varus angulation of the distal fracture fragment. The femoral head is still seated within the right acetabulum. Iliofemoral in pubic femoral ligaments are intact. Ligamentum teres is intact. There is mild bilateral degenerative hip arthritis. There is injury signal within the right gluteal musculature compatible with mild strain. Similar changes are noted within the left gluteus maximus muscle. There is increased signal compatible with greater trochanteric bursitis on the right. There is increased signal within the visualized proximal vastus lateralis and intermedius compatible with muscle strain. No frank avulsion. There is irregularity of the left iliac crest without associated marrow edema or soft tissue mass most in keeping with mod trauma or surgical intervention. IMPRESSION: Acute, impacted, mildly angulated right subcapital femoral neck fracture Increased signal in  keeping with muscular strain involving the a right gluteal musculature as well as the vastus lateralis and intermedius. No frank tendinous avulsion. Mild superimposed bilateral hip degenerative arthritis. Electronically Signed   By: Fidela Salisbury MD   On: 03/02/2020 22:36   DG Hip Unilat With Pelvis 2-3 Views Left  Result Date: 03/02/2020 CLINICAL DATA:  Pain EXAM: DG  HIP (WITH OR WITHOUT PELVIS) 2-3V LEFT COMPARISON:  None. FINDINGS: Frontal pelvis as well as frontal and lateral left hip images were obtained. No fracture or dislocation. There is mild symmetric narrowing of each hip joint. There is moderate osteoarthritic change in the right sacroiliac joint. There is also degenerative change in the lower lumbar spine. No erosions. There is bony overgrowth along the left lateral iliac crest. IMPRESSION: Mild symmetric narrowing each hip joint. 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 Bony overgrowth along the lateral left iliac crest measuring 8.8 x 3.3 cm. Question prior trauma in this area. Neoplastic etiology for this somewhat unusual appearance must be questioned. Nonemergent CT of the pelvis to further evaluate this region is felt to be warranted. No acute fracture or dislocation. Electronically Signed   By: Lowella Grip III M.D.   On: 03/02/2020 13:07     Assessment/Plan 86 year old male with history of COPD, chronic diastolic heart failure, chronic anemia, HLD, HTN, chronic osteoarthritis and low back pain, BPH and glaucoma, who at baseline has limited ambulation, presenting with shortness of breath and 2-week history of left hip pain with no reported history of a fall as well as dyspnea on exertion to where it limits his ability to participate in his ADLs .   Fracture of femoral neck, right, closed (HCC)   Chronic bilateral low back pain without sciatica and osteoarthritis   Ambulatory dysfunction, acute on chronic -Patient with 97-monthhistory of impairment  ambulation with 2-week history of acute worsening of his ability to ambulate due to left hip pain without history of fall -MRI as requested by Dr. MRudene Christiansshowing right femoral neck fracture. -Hip x-ray showed "lateral left iliac crest -N.p.o. for operative repair on 03/03/2020 -Pain control -Further orders per orthopedics -Orthopedic consulted  Shortness of breath -Patient having increased shortness of breath for the past couple weeks of uncertain etiology -Chest x-ray showing left tracheal deviation possibly due to thyroid enlargement, small pleural effusions bilaterally, cardiomegaly with normal pulmonary vascularity -Possible mild exacerbation of CHF to treat as below  Left tracheal deviation on chest x-ray -Chest x-ray showing left tracheal deviation possibly due to enlarged thyroid -Patient endorses hoarseness and difficulty swallowing for the past month in addition to shortness of breath -follow thyroid ultrasound .   Possible mild exacerbation of chronic diastolic CHF (congestive heart failure) ([place]  -Patient with a shortness of breath, mild lower extremity edema that improved with low-dose Lasix -Chest x-ray showed normal pulmonary vascularity cardiomegaly and small pleural effusions -We will get BNP and baseline troponin -Daily weights, Reds vest -Continue home Lasix and lisinopril for now as patient appears mostly euvolemic -EF July 2021 was 516to 60% with grade 2 diastolic dysfunction -Patient declined an ischemic work-up September 2021 by Dr. ESaunders Revel-We will consult cardiology for clearance    COPD with chronic bronchitis ([place] -Continue home nebulizers.  DuoNebs as needed    Essential hypertension -Continue lisinopril    Benign prostatic hyperplasia with urinary frequency -Continue Proscar    PAD (peripheral artery disease) (HCC) -Continue aspirin    Glaucoma -Continue timolol drops    Chronic anemia -Hemoglobin at baseline.  Patient follows with Dr. BRogue Bussing    DVT prophylaxis: SCDs Code Status: DNR. Discussed with patient with daughter JMechele Claudeat bedside Family Communication:  JCollier Flowers Disposition Plan: Back to previous home environment Consults called: Dr. MRudene Christians Dr ARayann HemanStatus: Inpatient for inpatient only procedure    HAthena MasseMD [facility]  03/02/2020, 11:39 PM

## 2020-03-02 NOTE — ED Notes (Signed)
Pt returned from CT. Pain a 4/10, better, but not gone.

## 2020-03-02 NOTE — Telephone Encounter (Signed)
Spoke to patient's daughter, Raymond Barnett(DPR). Raymond Barnett stated that patient is experiencing increased sob and hip/back pain. Sx have been present for one week. Denied fever, chills or sweats. Patient does not have supplement oxygen Raymond Barnett is unsure if sx are related to anxiety.  Patient is not using Symbicort nor albuterol.   I have spoken to Dr. Patsey Berthold verbally, who recommends ED due to patient's history of heart failure.   Raymond Barnett is aware and voiced her understanding.   Nothing further is needed.

## 2020-03-03 ENCOUNTER — Encounter: Payer: Self-pay | Admitting: Internal Medicine

## 2020-03-03 ENCOUNTER — Inpatient Hospital Stay: Payer: Medicare Other | Admitting: Anesthesiology

## 2020-03-03 ENCOUNTER — Inpatient Hospital Stay: Payer: Medicare Other

## 2020-03-03 ENCOUNTER — Encounter
Admission: EM | Disposition: A | Discharge status: Skilled Nursing Facility | Payer: Self-pay | Source: Home / Self Care | Attending: Internal Medicine

## 2020-03-03 DIAGNOSIS — I25118 Atherosclerotic heart disease of native coronary artery with other forms of angina pectoris: Secondary | ICD-10-CM | POA: Diagnosis not present

## 2020-03-03 DIAGNOSIS — S72001S Fracture of unspecified part of neck of right femur, sequela: Secondary | ICD-10-CM

## 2020-03-03 DIAGNOSIS — I5032 Chronic diastolic (congestive) heart failure: Secondary | ICD-10-CM

## 2020-03-03 DIAGNOSIS — S72001A Fracture of unspecified part of neck of right femur, initial encounter for closed fracture: Secondary | ICD-10-CM

## 2020-03-03 DIAGNOSIS — M545 Low back pain, unspecified: Secondary | ICD-10-CM

## 2020-03-03 DIAGNOSIS — G8929 Other chronic pain: Secondary | ICD-10-CM

## 2020-03-03 DIAGNOSIS — I739 Peripheral vascular disease, unspecified: Secondary | ICD-10-CM

## 2020-03-03 DIAGNOSIS — D649 Anemia, unspecified: Secondary | ICD-10-CM

## 2020-03-03 DIAGNOSIS — R350 Frequency of micturition: Secondary | ICD-10-CM

## 2020-03-03 DIAGNOSIS — N401 Enlarged prostate with lower urinary tract symptoms: Secondary | ICD-10-CM

## 2020-03-03 DIAGNOSIS — Z01810 Encounter for preprocedural cardiovascular examination: Secondary | ICD-10-CM

## 2020-03-03 HISTORY — PX: HIP ARTHROPLASTY: SHX981

## 2020-03-03 LAB — RESPIRATORY PANEL BY RT PCR (FLU A&B, COVID)
Influenza A by PCR: NEGATIVE
Influenza B by PCR: NEGATIVE
SARS Coronavirus 2 by RT PCR: NEGATIVE

## 2020-03-03 LAB — CBC
HCT: 33.1 % — ABNORMAL LOW (ref 39.0–52.0)
Hemoglobin: 10.9 g/dL — ABNORMAL LOW (ref 13.0–17.0)
MCH: 32.2 pg (ref 26.0–34.0)
MCHC: 32.9 g/dL (ref 30.0–36.0)
MCV: 97.9 fL (ref 80.0–100.0)
Platelets: 123 10*3/uL — ABNORMAL LOW (ref 150–400)
RBC: 3.38 MIL/uL — ABNORMAL LOW (ref 4.22–5.81)
RDW: 21.5 % — ABNORMAL HIGH (ref 11.5–15.5)
WBC: 10.4 10*3/uL (ref 4.0–10.5)
nRBC: 0.3 % — ABNORMAL HIGH (ref 0.0–0.2)

## 2020-03-03 LAB — BASIC METABOLIC PANEL
Anion gap: 10 (ref 5–15)
BUN: 20 mg/dL (ref 8–23)
CO2: 24 mmol/L (ref 22–32)
Calcium: 8.5 mg/dL — ABNORMAL LOW (ref 8.9–10.3)
Chloride: 96 mmol/L — ABNORMAL LOW (ref 98–111)
Creatinine, Ser: 0.78 mg/dL (ref 0.61–1.24)
GFR, Estimated: >60 mL/min (ref >60)
Glucose, Bld: 129 mg/dL — ABNORMAL HIGH (ref 70–99)
Potassium: 4.2 mmol/L (ref 3.5–5.1)
Sodium: 130 mmol/L — ABNORMAL LOW (ref 135–145)

## 2020-03-03 LAB — SURGICAL PCR SCREEN
MRSA, PCR: NEGATIVE
Staphylococcus aureus: NEGATIVE

## 2020-03-03 LAB — TROPONIN I (HIGH SENSITIVITY): Troponin I (High Sensitivity): 49 ng/L — ABNORMAL HIGH (ref <18)

## 2020-03-03 LAB — TSH: TSH: 4.324 u[IU]/mL (ref 0.350–4.500)

## 2020-03-03 LAB — BRAIN NATRIURETIC PEPTIDE: B Natriuretic Peptide: 540.9 pg/mL — ABNORMAL HIGH (ref 0.0–100.0)

## 2020-03-03 SURGERY — HEMIARTHROPLASTY, HIP, DIRECT ANTERIOR APPROACH, FOR FRACTURE
Anesthesia: Spinal | Site: Hip | Laterality: Right

## 2020-03-03 MED ORDER — IPRATROPIUM-ALBUTEROL 0.5-2.5 (3) MG/3ML IN SOLN: 3.0000 mL | RESPIRATORY_TRACT | Status: DC | PRN

## 2020-03-03 MED ORDER — ENOXAPARIN SODIUM 40 MG/0.4ML ~~LOC~~ SOLN
40.0000 mg | SUBCUTANEOUS | Status: DC
  Administered 2020-03-04 – 2020-03-07 (×4): 40 mg via SUBCUTANEOUS
  Filled 2020-03-03 (×4): qty 0.4

## 2020-03-03 MED ORDER — ALUM & MAG HYDROXIDE-SIMETH 200-200-20 MG/5ML PO SUSP: 30.0000 mL | ORAL | Status: DC | PRN

## 2020-03-03 MED ORDER — NEOMYCIN-POLYMYXIN B GU 40-200000 IR SOLN
Status: DC | PRN
  Administered 2020-03-03: 2 mL

## 2020-03-03 MED ORDER — CHLORHEXIDINE GLUCONATE CLOTH 2 % EX PADS
6.0000 | MEDICATED_PAD | Freq: Every day | CUTANEOUS | Status: DC
  Administered 2020-03-04 (×2): 6 via TOPICAL

## 2020-03-03 MED ORDER — MAGNESIUM HYDROXIDE 400 MG/5ML PO SUSP
30.0000 mL | Freq: Every day | ORAL | Status: DC | PRN
  Administered 2020-03-05 – 2020-03-06 (×2): 30 mL via ORAL
  Filled 2020-03-03 (×3): qty 30

## 2020-03-03 MED ORDER — LISINOPRIL 5 MG PO TABS
5.0000 mg | ORAL_TABLET | Freq: Every day | ORAL | Status: DC
  Filled 2020-03-03: qty 1

## 2020-03-03 MED ORDER — FENTANYL CITRATE (PF) 100 MCG/2ML IJ SOLN: 25.0000 ug | INTRAMUSCULAR | Status: DC | PRN

## 2020-03-03 MED ORDER — ZOLPIDEM TARTRATE 5 MG PO TABS
5.0000 mg | ORAL_TABLET | Freq: Every evening | ORAL | Status: DC | PRN
  Filled 2020-03-03: qty 1

## 2020-03-03 MED ORDER — METHOCARBAMOL 500 MG PO TABS
500.0000 mg | ORAL_TABLET | Freq: Four times a day (QID) | ORAL | Status: DC | PRN
  Administered 2020-03-04 – 2020-03-05 (×3): 500 mg via ORAL
  Filled 2020-03-03 (×4): qty 1

## 2020-03-03 MED ORDER — BUPIVACAINE HCL (PF) 0.5 % IJ SOLN
INTRAMUSCULAR | Status: DC | PRN
  Administered 2020-03-03: 2.5 mL

## 2020-03-03 MED ORDER — BUPIVACAINE HCL (PF) 0.5 % IJ SOLN
INTRAMUSCULAR | Status: AC
  Filled 2020-03-03: qty 10

## 2020-03-03 MED ORDER — CEFAZOLIN SODIUM 1 G IJ SOLR
INTRAMUSCULAR | Status: AC
  Filled 2020-03-03: qty 20

## 2020-03-03 MED ORDER — MAGNESIUM CITRATE PO SOLN
1.0000 | Freq: Once | ORAL | Status: DC | PRN
  Filled 2020-03-03: qty 296

## 2020-03-03 MED ORDER — PROPOFOL 500 MG/50ML IV EMUL
INTRAVENOUS | Status: AC
  Filled 2020-03-03: qty 50

## 2020-03-03 MED ORDER — METOCLOPRAMIDE HCL 10 MG PO TABS: 5.0000 mg | ORAL_TABLET | Freq: Three times a day (TID) | ORAL | Status: DC | PRN

## 2020-03-03 MED ORDER — ENSURE ENLIVE PO LIQD
237.0000 mL | Freq: Two times a day (BID) | ORAL | Status: DC
  Filled 2020-03-03: qty 237

## 2020-03-03 MED ORDER — VITAMIN D3 25 MCG (1000 UNIT) PO TABS
1000.0000 [IU] | ORAL_TABLET | Freq: Every day | ORAL | Status: DC
  Administered 2020-03-06 – 2020-03-07 (×2): 1000 [IU] via ORAL
  Filled 2020-03-03 (×8): qty 1

## 2020-03-03 MED ORDER — DIPHENHYDRAMINE HCL 12.5 MG/5ML PO ELIX
12.5000 mg | ORAL_SOLUTION | ORAL | Status: DC | PRN
  Administered 2020-03-05: 12.5 mg via ORAL
  Administered 2020-03-05: 25 mg via ORAL
  Filled 2020-03-03: qty 10
  Filled 2020-03-03: qty 5

## 2020-03-03 MED ORDER — FLUTICASONE PROPIONATE 50 MCG/ACT NA SUSP
2.0000 | Freq: Every day | NASAL | Status: DC | PRN
  Filled 2020-03-03: qty 16

## 2020-03-03 MED ORDER — HYDROCODONE-ACETAMINOPHEN 7.5-325 MG PO TABS
1.0000 | ORAL_TABLET | ORAL | Status: DC | PRN
  Administered 2020-03-04 (×3): 1 via ORAL
  Administered 2020-03-05: 2 via ORAL
  Filled 2020-03-03 (×3): qty 1
  Filled 2020-03-03 (×2): qty 2

## 2020-03-03 MED ORDER — FENTANYL CITRATE (PF) 100 MCG/2ML IJ SOLN
INTRAMUSCULAR | Status: AC
  Filled 2020-03-03: qty 2

## 2020-03-03 MED ORDER — LACTATED RINGERS IV SOLN: INTRAVENOUS | Status: DC | PRN

## 2020-03-03 MED ORDER — PROPOFOL 10 MG/ML IV BOLUS
INTRAVENOUS | Status: DC | PRN
  Administered 2020-03-03 (×2): 30 mg via INTRAVENOUS

## 2020-03-03 MED ORDER — ONDANSETRON HCL 4 MG PO TABS: 4.0000 mg | ORAL_TABLET | Freq: Four times a day (QID) | ORAL | Status: DC | PRN

## 2020-03-03 MED ORDER — PHENYLEPHRINE HCL (PRESSORS) 10 MG/ML IV SOLN
INTRAVENOUS | Status: DC | PRN
  Administered 2020-03-03: 100 ug via INTRAVENOUS
  Administered 2020-03-03 (×3): 200 ug via INTRAVENOUS

## 2020-03-03 MED ORDER — ONDANSETRON HCL 4 MG/2ML IJ SOLN: 4.0000 mg | Freq: Four times a day (QID) | INTRAMUSCULAR | Status: DC | PRN

## 2020-03-03 MED ORDER — MORPHINE SULFATE (PF) 2 MG/ML IV SOLN
0.5000 mg | INTRAVENOUS | Status: DC | PRN
  Administered 2020-03-04: 1 mg via INTRAVENOUS
  Filled 2020-03-03: qty 1

## 2020-03-03 MED ORDER — TRAMADOL HCL 50 MG PO TABS
50.0000 mg | ORAL_TABLET | Freq: Four times a day (QID) | ORAL | Status: DC
  Administered 2020-03-03 – 2020-03-06 (×3): 50 mg via ORAL
  Filled 2020-03-03 (×4): qty 1

## 2020-03-03 MED ORDER — HYDROCODONE-ACETAMINOPHEN 5-325 MG PO TABS
1.0000 | ORAL_TABLET | ORAL | Status: DC | PRN
  Administered 2020-03-05 – 2020-03-06 (×5): 2 via ORAL
  Administered 2020-03-06 – 2020-03-07 (×4): 1 via ORAL
  Administered 2020-03-07 (×2): 2 via ORAL
  Filled 2020-03-03: qty 2
  Filled 2020-03-03: qty 1
  Filled 2020-03-03: qty 2
  Filled 2020-03-03 (×2): qty 1
  Filled 2020-03-03 (×3): qty 2
  Filled 2020-03-03: qty 1
  Filled 2020-03-03: qty 2
  Filled 2020-03-03: qty 1
  Filled 2020-03-03: qty 2

## 2020-03-03 MED ORDER — CEFAZOLIN SODIUM-DEXTROSE 2-4 GM/100ML-% IV SOLN
2.0000 g | Freq: Once | INTRAVENOUS | Status: AC
  Administered 2020-03-03: 2 g via INTRAVENOUS
  Filled 2020-03-03: qty 100

## 2020-03-03 MED ORDER — POLYVINYL ALCOHOL 1.4 % OP SOLN
1.0000 [drp] | OPHTHALMIC | Status: DC | PRN
  Filled 2020-03-03: qty 15

## 2020-03-03 MED ORDER — PHENOL 1.4 % MT LIQD
1.0000 | OROMUCOSAL | Status: DC | PRN
  Filled 2020-03-03: qty 177

## 2020-03-03 MED ORDER — ONDANSETRON HCL 4 MG/2ML IJ SOLN
INTRAMUSCULAR | Status: AC
  Filled 2020-03-03: qty 2

## 2020-03-03 MED ORDER — TIMOLOL MALEATE 0.5 % OP SOLN
1.0000 [drp] | Freq: Two times a day (BID) | OPHTHALMIC | Status: DC
  Administered 2020-03-03 – 2020-03-07 (×8): 1 [drp] via OPHTHALMIC
  Filled 2020-03-03: qty 5

## 2020-03-03 MED ORDER — ONDANSETRON HCL 4 MG/2ML IJ SOLN
INTRAMUSCULAR | Status: DC | PRN
  Administered 2020-03-03: 4 mg via INTRAVENOUS

## 2020-03-03 MED ORDER — PANTOPRAZOLE SODIUM 40 MG PO TBEC
40.0000 mg | DELAYED_RELEASE_TABLET | Freq: Every day | ORAL | Status: DC
  Administered 2020-03-05 – 2020-03-07 (×3): 40 mg via ORAL
  Filled 2020-03-03 (×4): qty 1

## 2020-03-03 MED ORDER — OYSTER SHELL CALCIUM/D 500-5 MG-MCG PO TABS
1.0000 | ORAL_TABLET | Freq: Every day | ORAL | Status: DC
  Administered 2020-03-06 – 2020-03-07 (×2): 1 via ORAL
  Filled 2020-03-03 (×8): qty 1

## 2020-03-03 MED ORDER — ONDANSETRON HCL 4 MG/2ML IJ SOLN: 4.0000 mg | Freq: Once | INTRAMUSCULAR | Status: DC | PRN

## 2020-03-03 MED ORDER — METOCLOPRAMIDE HCL 5 MG/ML IJ SOLN: 5.0000 mg | Freq: Three times a day (TID) | INTRAMUSCULAR | Status: DC | PRN

## 2020-03-03 MED ORDER — ACETAMINOPHEN 325 MG PO TABS: 325.0000 mg | ORAL_TABLET | Freq: Four times a day (QID) | ORAL | Status: DC | PRN

## 2020-03-03 MED ORDER — SODIUM CHLORIDE 0.9 % IV SOLN
INTRAVENOUS | Status: DC | PRN
  Administered 2020-03-03: 40 ug/min via INTRAVENOUS

## 2020-03-03 MED ORDER — METHOCARBAMOL 1000 MG/10ML IJ SOLN
500.0000 mg | Freq: Four times a day (QID) | INTRAVENOUS | Status: DC | PRN
  Filled 2020-03-03: qty 5

## 2020-03-03 MED ORDER — CEFAZOLIN SODIUM-DEXTROSE 2-4 GM/100ML-% IV SOLN
2.0000 g | Freq: Four times a day (QID) | INTRAVENOUS | Status: AC
  Administered 2020-03-03 (×2): 2 g via INTRAVENOUS
  Filled 2020-03-03 (×2): qty 100

## 2020-03-03 MED ORDER — BUPIVACAINE-EPINEPHRINE 0.25% -1:200000 IJ SOLN
INTRAMUSCULAR | Status: DC | PRN
  Administered 2020-03-03: 30 mL

## 2020-03-03 MED ORDER — DOCUSATE SODIUM 100 MG PO CAPS
100.0000 mg | ORAL_CAPSULE | Freq: Two times a day (BID) | ORAL | Status: DC
  Administered 2020-03-03 – 2020-03-07 (×8): 100 mg via ORAL
  Filled 2020-03-03 (×8): qty 1

## 2020-03-03 MED ORDER — CARBOXYMETHYLCELLULOSE SOD PF 0.5 % OP SOLN: 1.0000 "application " | OPHTHALMIC | Status: DC | PRN

## 2020-03-03 MED ORDER — PROPOFOL 500 MG/50ML IV EMUL
INTRAVENOUS | Status: DC | PRN
  Administered 2020-03-03: 50 ug/kg/min via INTRAVENOUS

## 2020-03-03 MED ORDER — BISACODYL 10 MG RE SUPP
10.0000 mg | Freq: Every day | RECTAL | Status: DC | PRN
  Filled 2020-03-03 (×2): qty 1

## 2020-03-03 MED ORDER — ASPIRIN 81 MG PO CHEW
81.0000 mg | CHEWABLE_TABLET | Freq: Every day | ORAL | Status: DC
  Filled 2020-03-03 (×3): qty 1

## 2020-03-03 MED ORDER — SODIUM CHLORIDE 0.9 % IV SOLN: INTRAVENOUS | Status: DC

## 2020-03-03 MED ORDER — MENTHOL 3 MG MT LOZG
1.0000 | LOZENGE | OROMUCOSAL | Status: DC | PRN
  Filled 2020-03-03: qty 9

## 2020-03-03 SURGICAL SUPPLY — 68 items
BLADE SAGITTAL AGGR TOOTH XLG (BLADE) ×3 IMPLANT
BNDG COHESIVE 6X5 TAN STRL LF (GAUZE/BANDAGES/DRESSINGS) ×9 IMPLANT
BOWL CEMENT MIXING ADV NOZZLE (MISCELLANEOUS) ×3 IMPLANT
CANISTER SUCT 1200ML W/VALVE (MISCELLANEOUS) ×3 IMPLANT
CANISTER WOUND CARE 500ML ATS (WOUND CARE) ×3 IMPLANT
CEMENT BONE 40GM (Cement) ×6 IMPLANT
CEMENT RESTRICTOR DEPUY SZ 7 (Cement) ×3 IMPLANT
CHLORAPREP W/TINT 26 (MISCELLANEOUS) ×3 IMPLANT
COVER BACK TABLE REUSABLE LG (DRAPES) ×3 IMPLANT
COVER WAND RF STERILE (DRAPES) ×3 IMPLANT
DRAPE 3/4 80X56 (DRAPES) ×9 IMPLANT
DRAPE C-ARM XRAY 36X54 (DRAPES) ×3 IMPLANT
DRAPE INCISE IOBAN 66X60 STRL (DRAPES) IMPLANT
DRAPE POUCH INSTRU U-SHP 10X18 (DRAPES) ×3 IMPLANT
DRESSING SURGICEL FIBRLLR 1X2 (HEMOSTASIS) ×2 IMPLANT
DRSG MEPILEX FLEX 3X3 (GAUZE/BANDAGES/DRESSINGS) ×3 IMPLANT
DRSG MEPILEX SACRM 8.7X9.8 (GAUZE/BANDAGES/DRESSINGS) ×3 IMPLANT
DRSG OPSITE POSTOP 4X8 (GAUZE/BANDAGES/DRESSINGS) IMPLANT
DRSG SURGICEL FIBRILLAR 1X2 (HEMOSTASIS) ×6
ELECT BLADE 6.5 EXT (BLADE) ×3 IMPLANT
ELECT REM PT RETURN 9FT ADLT (ELECTROSURGICAL) ×3
ELECTRODE REM PT RTRN 9FT ADLT (ELECTROSURGICAL) ×1 IMPLANT
GLOVE BIOGEL PI IND STRL 9 (GLOVE) ×1 IMPLANT
GLOVE BIOGEL PI INDICATOR 9 (GLOVE) ×2
GLOVE SURG SYN 9.0  PF PI (GLOVE) ×4
GLOVE SURG SYN 9.0 PF PI (GLOVE) ×2 IMPLANT
GOWN SRG 2XL LVL 4 RGLN SLV (GOWNS) ×1 IMPLANT
GOWN STRL NON-REIN 2XL LVL4 (GOWNS) ×2
GOWN STRL REUS W/ TWL LRG LVL3 (GOWN DISPOSABLE) ×1 IMPLANT
GOWN STRL REUS W/TWL LRG LVL3 (GOWN DISPOSABLE) ×2
HEAD BIPOLAR SZ 54 (Head) ×3 IMPLANT
HEMOVAC 400CC 10FR (MISCELLANEOUS) IMPLANT
HIP FEM HD S 28 (Head) ×3 IMPLANT
HOLDER FOLEY CATH W/STRAP (MISCELLANEOUS) ×3 IMPLANT
HOOD PEEL AWAY FLYTE STAYCOOL (MISCELLANEOUS) ×3 IMPLANT
IRRIGATION SURGIPHOR STRL (IV SOLUTION) ×3 IMPLANT
KIT PREVENA INCISION MGT 13 (CANNISTER) ×3 IMPLANT
KNIFE SCULPS 14X20 (INSTRUMENTS) ×3 IMPLANT
MANIFOLD NEPTUNE II (INSTRUMENTS) ×3 IMPLANT
MAT ABSORB  FLUID 56X50 GRAY (MISCELLANEOUS) ×2
MAT ABSORB FLUID 56X50 GRAY (MISCELLANEOUS) ×1 IMPLANT
NDL SAFETY ECLIPSE 18X1.5 (NEEDLE) ×1 IMPLANT
NEEDLE HYPO 18GX1.5 SHARP (NEEDLE) ×2
NEEDLE SPNL 20GX3.5 QUINCKE YW (NEEDLE) ×6 IMPLANT
NOZZLE FLEX THIN (MISCELLANEOUS) ×3 IMPLANT
NS IRRIG 1000ML POUR BTL (IV SOLUTION) ×3 IMPLANT
PACK HIP COMPR (MISCELLANEOUS) ×3 IMPLANT
PRESSURIZER FEM CANAL M (MISCELLANEOUS) ×3 IMPLANT
SCALPEL PROTECTED #10 DISP (BLADE) ×6 IMPLANT
SOL PREP PVP 2OZ (MISCELLANEOUS)
SOLUTION PREP PVP 2OZ (MISCELLANEOUS) IMPLANT
SPONGE DRAIN TRACH 4X4 STRL 2S (GAUZE/BANDAGES/DRESSINGS) ×3 IMPLANT
STAPLER SKIN PROX 35W (STAPLE) ×3 IMPLANT
STEM HIP FEM SZ6 STD (Stem) ×3 IMPLANT
STRAP SAFETY 5IN WIDE (MISCELLANEOUS) ×3 IMPLANT
SUT DVC 2 QUILL PDO  T11 36X36 (SUTURE) ×2
SUT DVC 2 QUILL PDO T11 36X36 (SUTURE) ×1 IMPLANT
SUT SILK 0 (SUTURE) ×2
SUT SILK 0 30XBRD TIE 6 (SUTURE) ×1 IMPLANT
SUT V-LOC 90 ABS DVC 3-0 CL (SUTURE) ×3 IMPLANT
SUT VIC AB 1 CT1 36 (SUTURE) ×3 IMPLANT
SYR 20ML LL LF (SYRINGE) ×3 IMPLANT
SYR 30ML LL (SYRINGE) ×3 IMPLANT
SYR 50ML LL SCALE MARK (SYRINGE) ×6 IMPLANT
SYR BULB IRRIG 60ML STRL (SYRINGE) ×3 IMPLANT
TAPE MICROFOAM 4IN (TAPE) ×3 IMPLANT
TOWEL OR 17X26 4PK STRL BLUE (TOWEL DISPOSABLE) ×3 IMPLANT
TRAY FOLEY MTR SLVR 16FR STAT (SET/KITS/TRAYS/PACK) ×3 IMPLANT

## 2020-03-03 NOTE — Anesthesia Procedure Notes (Signed)
Spinal  Patient location during procedure: OR Start time: 03/03/2020 11:55 AM End time: 03/03/2020 12:01 PM Staffing Performed: resident/CRNA  Anesthesiologist: Martha Clan, MD Resident/CRNA: Hedda Slade, CRNA Preanesthetic Checklist Completed: patient identified, IV checked, site marked, risks and benefits discussed, surgical consent, monitors and equipment checked, pre-op evaluation and timeout performed Spinal Block Patient position: sitting Prep: ChloraPrep Patient monitoring: heart rate, continuous pulse ox, blood pressure and cardiac monitor Approach: midline Location: L3-4 Injection technique: single-shot Needle Needle type: Whitacre and Introducer  Needle gauge: 24 G Needle length: 9 cm Additional Notes Negative paresthesia. Negative blood return. Positive free-flowing CSF. Expiration date of kit checked and confirmed. Patient tolerated procedure well, without complications.

## 2020-03-03 NOTE — Progress Notes (Signed)
PROGRESS NOTE    Raymond LATIN  ZDG:[id number] DOB: January 09, 1930 DOA: 03/02/2020 PCP: [name], DO    Brief Narrative:  Raymond Barnett is a 86 y.o. male with medical history significant for COPD, chronic diastolic heart failure, chronic anemia, HLD, HTN, chronic osteoarthritis and low back pain, BPH and glaucoma, who at baseline has limited ambulation, using motorized wheelchair prescribed by his provider for the past 2 months was brought into the emergency room at the recommendation of his PCP due to a complaint of persistent and left hip pain which has acutely worsened his ability to ambulate.left hip x-ray but then went on to have CT pelvis followed by MRI pelvis that showed right impacted subcapital femoral neck fracture.   111/12- went to OR today.Cardiology saw pt for cardiac clearance    Consultants:   Cardiology, orthopedics  Procedures:   Antimicrobials:    cefazolin x3 doses   Subjective: Post surgery , has no complaints. No pain yet, cp or sob  Objective: Vitals:   03/03/20 0100 03/03/20 0122 03/03/20 0555 03/03/20 0816  BP: (!) 143/83 (!) 148/91 103/70 126/67  Pulse: 77 90 99 92  Resp: 18   17  Temp:   98.7 F (37.1 C) 98 F (36.7 C)  TempSrc:   Oral Oral  SpO2: 94% 94% (!) 89% 95%  Weight:  91.7 kg    Height:  5\' 10"  (1.778 m)      Intake/Output Summary (Last 24 hours) at 03/03/2020 0907 Last data filed at 03/03/2020 0630 Gross per 24 hour  Intake --  Output 50 ml  Net -50 ml   Filed Weights   03/02/20 1208 03/02/20 1217 03/03/20 0122  Weight: 93 kg 90.7 kg 91.7 kg    Examination:  General exam: Appears calm and comfortable  Respiratory system: Clear to auscultation. Respiratory effort normal. Cardiovascular system: S1 & S2 heard, RRR. No JVD, murmurs, rubs, gallops or clicks.  Gastrointestinal system: Abdomen is nondistended, soft and nontender. Normal bowel sounds heard. Central nervous system: Alert and oriented. Grossly  intact Extremities: no edema Skin:warm, dry Psychiatry: Judgement and insight appear normal. Mood & affect appropriate.     Data Reviewed: I have personally reviewed following labs and imaging studies  CBC: Recent Labs  Lab 03/02/20 1228 03/03/20 0025  WBC 9.9 10.4  HGB 11.6* 10.9*  HCT 35.5* 33.1*  MCV 97.8 97.9  PLT 158 951*   Basic Metabolic Panel: Recent Labs  Lab 03/02/20 1228 03/03/20 0025  NA 132* 130*  K 4.4 4.2  CL 96* 96*  CO2 25 24  GLUCOSE 104* 129*  BUN 19 20  CREATININE 0.84 0.78  CALCIUM 8.8* 8.5*   GFR: Estimated Creatinine Clearance: 72.7 mL/min (by C-G formula based on SCr of 0.78 mg/dL). Liver Function Tests: No results for input(s): AST, ALT, ALKPHOS, BILITOT, PROT, ALBUMIN in the last 168 hours. No results for input(s): LIPASE, AMYLASE in the last 168 hours. No results for input(s): AMMONIA in the last 168 hours. Coagulation Profile: No results for input(s): INR, PROTIME in the last 168 hours. Cardiac Enzymes: No results for input(s): CKTOTAL, CKMB, CKMBINDEX, TROPONINI in the last 168 hours. BNP (last 3 results) No results for input(s): PROBNP in the last 8760 hours. HbA1C: No results for input(s): HGBA1C in the last 72 hours. CBG: No results for input(s): GLUCAP in the last 168 hours. Lipid Profile: No results for input(s): CHOL, HDL, LDLCALC, TRIG, CHOLHDL, LDLDIRECT in the last 72 hours. Thyroid Function Tests: Recent  Labs    03/03/20 0020  TSH 4.324   Anemia Panel: No results for input(s): VITAMINB12, FOLATE, FERRITIN, TIBC, IRON, RETICCTPCT in the last 72 hours. Sepsis Labs: No results for input(s): PROCALCITON, LATICACIDVEN in the last 168 hours.  Recent Results (from the past 240 hour(s))  Respiratory Panel by RT PCR (Flu A&B, Covid) - Nasopharyngeal Swab     Status: None   Collection Time: 03/03/20 12:23 AM   Specimen: Nasopharyngeal Swab  Result Value Ref Range Status   SARS Coronavirus 2 by RT PCR NEGATIVE NEGATIVE  Final    Comment: (NOTE) SARS-CoV-2 target nucleic acids are NOT DETECTED.  The SARS-CoV-2 RNA is generally detectable in upper respiratoy specimens during the acute phase of infection. The lowest concentration of SARS-CoV-2 viral copies this assay can detect is 131 copies/mL. A negative result does not preclude SARS-Cov-2 infection and should not be used as the sole basis for treatment or other patient management decisions. A negative result may occur with  improper specimen collection/handling, submission of specimen other than nasopharyngeal swab, presence of viral mutation(s) within the areas targeted by this assay, and inadequate number of viral copies (<131 copies/mL). A negative result must be combined with clinical observations, patient history, and epidemiological information. The expected result is Negative.  Fact Sheet for Patients:  PinkCheek.be  Fact Sheet for Healthcare Providers:  GravelBags.it  This test is no t yet approved or cleared by the [place] FDA and  has been authorized for detection and/or diagnosis of SARS-CoV-2 by FDA under an Emergency Use Authorization (EUA). This EUA will remain  in effect (meaning this test can be used) for the duration of the COVID-19 declaration under Section 564(b)(1) of the Act, 21 U.S.C. section 360bbb-3(b)(1), unless the authorization is terminated or revoked sooner.     Influenza A by PCR NEGATIVE NEGATIVE Final   Influenza B by PCR NEGATIVE NEGATIVE Final    Comment: (NOTE) The Xpert Xpress SARS-CoV-2/FLU/RSV assay is intended as an aid in  the diagnosis of influenza from Nasopharyngeal swab specimens and  should not be used as a sole basis for treatment. Nasal washings and  aspirates are unacceptable for Xpert Xpress SARS-CoV-2/FLU/RSV  testing.  Fact Sheet for Patients: PinkCheek.be  Fact Sheet for Healthcare  Providers: GravelBags.it  This test is not yet approved or cleared by the [place] FDA and  has been authorized for detection and/or diagnosis of SARS-CoV-2 by  FDA under an Emergency Use Authorization (EUA). This EUA will remain  in effect (meaning this test can be used) for the duration of the  Covid-19 declaration under Section 564(b)(1) of the Act, 21  U.S.C. section 360bbb-3(b)(1), unless the authorization is  terminated or revoked. Performed at [facility], [address] [place], [place] [address]   Surgical PCR screen     Status: None   Collection Time: 03/03/20  1:55 AM   Specimen: Nasal Mucosa; Nasal Swab  Result Value Ref Range Status   MRSA, PCR NEGATIVE NEGATIVE Final   Staphylococcus aureus NEGATIVE NEGATIVE Final    Comment: (NOTE) The Xpert SA Assay (FDA approved for NASAL specimens in patients 34 years of age and older), is one component of a comprehensive surveillance program. It is not intended to diagnose infection nor to guide or monitor treatment. Performed at [facility], [address] [place], [place] [address]          Radiology Studies: DG Chest 2 View  Result Date: 03/02/2020 CLINICAL DATA:  Shortness  of breath EXAM: CHEST - 2 VIEW COMPARISON:  October 12, 2019 FINDINGS: There is a small pleural effusion on each side with mild bibasilar atelectasis. The lungs elsewhere are clear. There is cardiomegaly with pulmonary vascularity normal. No adenopathy. There is aortic atherosclerosis. There is leftward deviation of the upper thoracic trachea. Bones appear osteoporotic. IMPRESSION: 1. Small pleural effusions bilaterally with bibasilar atelectasis. Lungs elsewhere clear. 2.  Cardiomegaly with pulmonary vascularity normal. 3. Deviation of the upper thoracic trachea toward the left. Question thyroid enlargement as etiology for this finding. 4.  Aortic Atherosclerosis (ICD10-I70.0). Electronically Signed    By: Lowella Grip III M.D.   On: 03/02/2020 13:03   CT PELVIS WO CONTRAST  Result Date: 03/02/2020 CLINICAL DATA:  Hip pain for 10 days EXAM: CT PELVIS WITHOUT CONTRAST TECHNIQUE: Multidetector CT imaging of the pelvis was performed following the standard protocol without intravenous contrast. COMPARISON:  Radiograph 03/02/2020 FINDINGS: Urinary Tract:  Urinary bladder is unremarkable. Bowel: Sigmoid colon diverticula without acute inflammatory process. Negative appendix. No bowel wall thickening Vascular/Lymphatic: Extensive aortic atherosclerosis without aneurysm. No suspicious nodes Reproductive:  Mild prostate calcification without enlargement Other:  Negative for pelvic effusion Musculoskeletal: Mild degenerative changes of both hips with joint space narrowing and asymmetric femoral head neck junction. Chronic appearing deformity at the left iliac crest potentially due to remote trauma or postsurgical change. There is no fracture or associated soft tissue mass. There is fatty atrophy of adjacent gluteus musculature. No femoral head dislocation. Coronal images are suspicious for acute appearing nondisplaced right femoral neck fracture. Pubic symphysis and rami appear intact. IMPRESSION: 1. No definite acute osseous abnormality of left hip. Chronic appearing osseous deformity of left iliac crest without evidence for bony destructive change or associated soft tissue mass. 2. Findings are suspicious for an acute nondisplaced right femoral neck fracture 3. Sigmoid colon diverticular disease without acute inflammatory change. Aortic Atherosclerosis (ICD10-I70.0). Electronically Signed   By: Donavan Foil M.D.   On: 03/02/2020 16:33   MR PELVIS WO CONTRAST  Result Date: 03/02/2020 CLINICAL DATA:  Right hip pain EXAM: MRI PELVIS WITHOUT CONTRAST TECHNIQUE: Multiplanar multisequence MR imaging of the pelvis was performed. No intravenous contrast was administered. COMPARISON:  CT 3:54 p.m., plain  radiographs 12:36 p.m. FINDINGS: Urinary Tract: The distal ureters are decompressed. The bladder is unremarkable. Bowel: The visualized large and small bowel are unremarkable per save for severe sigmoid diverticulosis. Vascular/Lymphatic: No pathologic adenopathy within the abdomen and pelvis Reproductive:  No mass or other significant abnormality Other:  None significant Musculoskeletal: There is an acute, impacted subcapital right femoral neck fracture with mild varus angulation of the distal fracture fragment. The femoral head is still seated within the right acetabulum. Iliofemoral in pubic femoral ligaments are intact. Ligamentum teres is intact. There is mild bilateral degenerative hip arthritis. There is injury signal within the right gluteal musculature compatible with mild strain. Similar changes are noted within the left gluteus maximus muscle. There is increased signal compatible with greater trochanteric bursitis on the right. There is increased signal within the visualized proximal vastus lateralis and intermedius compatible with muscle strain. No frank avulsion. There is irregularity of the left iliac crest without associated marrow edema or soft tissue mass most in keeping with mod trauma or surgical intervention. IMPRESSION: Acute, impacted, mildly angulated right subcapital femoral neck fracture Increased signal in keeping with muscular strain involving the a right gluteal musculature as well as the vastus lateralis and intermedius. No frank tendinous avulsion. Mild superimposed bilateral hip  degenerative arthritis. Electronically Signed   By: Fidela Salisbury MD   On: 03/02/2020 22:36   DG Hip Unilat With Pelvis 2-3 Views Left  Result Date: 03/02/2020 CLINICAL DATA:  Pain EXAM: DG HIP (WITH OR WITHOUT PELVIS) 2-3V LEFT COMPARISON:  None. FINDINGS: Frontal pelvis as well as frontal and lateral left hip images were obtained. No fracture or dislocation. There is mild symmetric narrowing of each hip  joint. There is moderate osteoarthritic change in the right sacroiliac joint. There is also degenerative change in the lower lumbar spine. No erosions. There is bony overgrowth along the left lateral iliac crest. IMPRESSION: Mild symmetric narrowing each hip joint. 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 Bony overgrowth along the lateral left iliac crest measuring 8.8 x 3.3 cm. Question prior trauma in this area. Neoplastic etiology for this somewhat unusual appearance must be questioned. Nonemergent CT of the pelvis to further evaluate this region is felt to be warranted. No acute fracture or dislocation. Electronically Signed   By: Lowella Grip III M.D.   On: 03/02/2020 13:07        Scheduled Meds: . Budeson-Glycopyrrol-Formoterol  2 puff Inhalation BID  . finasteride  5 mg Oral Daily  . furosemide  20 mg Oral Daily  . lisinopril  5 mg Oral Daily   Continuous Infusions:  Assessment & Plan:   Principal Problem:   Fracture of femoral neck, right, closed ([place]) Active Problems:   Essential hypertension   Chronic bilateral low back pain without sciatica   Benign prostatic hyperplasia with urinary frequency   PAD (peripheral artery disease) (HCC)   Chronic diastolic CHF (congestive heart failure) ([place]), EF 55-60% 10/2019   Ambulatory dysfunction   COPD with chronic bronchitis (HCC)   Glaucoma   Chronic anemia   Tracheal deviation   86 year old male with history of COPD, chronic diastolic heart failure, chronic anemia, HLD, HTN, chronic osteoarthritis and low back pain, BPH and glaucoma, who at baseline has limited ambulation, presenting with shortness of breath and 2-week history of left hip pain with no reported history of a fall as well as dyspnea on exertion to where it limits his ability to participate in his ADLs .   Fracture of femoral neck, right, closed ([place])   Chronic bilateral low back pain without sciatica and osteoarthritis   Ambulatory  dysfunction, acute on chronic -MRI as requested by [name] showing right femoral neck fracture. 11/12-s/p Rt hip arthroplasty by [name] today Pain control Bowel regimen PT when cleared by orthopedics   Shortness of breath -Patient having increased shortness of breath for the past couple weeks of uncertain etiology -Chest x-ray showing left tracheal deviation possibly due to thyroid enlargement, small pleural effusions bilaterally, cardiomegaly with normal pulmonary vascularity 11/12-cardiology input was appreciated, patient was at acceptable risk for surgery today. No further cardiac work-up is necessary.  Several months ago echo revealed normal ejection fraction Symptoms of shortness of breath improved with Lasix consistent with diastolic CHF Echo also with mild to moderate elevated right heart pressures, likely improved with Lasix Monitor IV fluids since patient is at high risk of CHF   Left tracheal deviation on chest x-ray -Chest x-ray showing left tracheal deviation possibly due to enlarged thyroid -Patient endorses hoarseness and difficulty swallowing for the past month in addition to shortness of breath 11/12-f/u thyroid ultrasound, may need to have ENT evaluation as outpt.  Possible mild exacerbation of chronic diastolic CHF (congestive heart failure) ([place]),  -Patient with a shortness  of breath, mild lower extremity edema  11/12- improved with lasix Need to monitor I/o Cardiology following D/c ivf to prevent volume overload Daily weight    COPD with chronic bronchitis ([place]) -without acute exacerbation Continue nebs/inhalers     Essential hypertension -controlled, continue with lisinopril     Benign prostatic hyperplasia with urinary frequency -Continue Proscar    PAD (peripheral artery disease) (HCC) -Continue aspirin    Glaucoma -Continue timolol drops    Chronic anemia -Hemoglobin at baseline.  Patient follows with Dr. Rogue Bussing  DVT  prophylaxis: lovenox Code Status:DNR Family Communication: none at bedside, pt stated wife just left no need to call  Status is: Inpatient  Remains inpatient appropriate because:Inpatient level of care appropriate due to severity of illness   Dispo: The patient is from: Home              Anticipated d/c is to: TBD              Anticipated d/c date is: 2 days              Patient currently is not medically stable to d/c.had surgery today. Needs PT assessment and clearance for dc from orthopedics            LOS: 1 day   Time spent: 35 min with >50% on coc   Nolberto Hanlon, MD [facility] Pager [phone]  If 7PM-7AM, please contact night-coverage [email or web address] Password [email or web address] 03/03/2020, 9:07 AM

## 2020-03-03 NOTE — Anesthesia Preprocedure Evaluation (Addendum)
Anesthesia Evaluation  Patient identified by MRN, date of birth, ID band Patient awake    Reviewed: Allergy & Precautions, H&P , NPO status , Patient's Chart, lab work & pertinent test results, reviewed documented beta blocker date and time   History of Anesthesia Complications Negative for: history of anesthetic complications  Airway Mallampati: II  TM Distance: >3 FB Neck ROM: full    Dental  (+) Caps, Dental Advidsory Given, Poor Dentition, Loose, Teeth Intact, Missing   Pulmonary shortness of breath, with exertion and Long-Term Oxygen Therapy, COPD,  oxygen dependent, neg recent URI, former smoker,    Pulmonary exam normal breath sounds clear to auscultation       Cardiovascular Exercise Tolerance: Good hypertension, (-) angina+ CAD, + Peripheral Vascular Disease and +CHF  (-) Past MI and (-) Cardiac Stents negative cardio ROS Normal cardiovascular exam(-) dysrhythmias + Valvular Problems/Murmurs MR  Rhythm:regular Rate:Normal  TTE: 1. Left ventricular ejection fraction, by estimation, is 55 to 60%. The  left ventricle has normal function. The left ventricle has no regional  wall motion abnormalities. There is mild left ventricular hypertrophy.  Left ventricular diastolic parameters  are consistent with Grade II diastolic dysfunction (pseudonormalization).  2. Right ventricular systolic function is normal. The right ventricular  size is normal. There is moderately elevated pulmonary artery systolic  pressure. The estimated right ventricular systolic pressure is 19.5 mmHg.  3. Left atrial size was moderately dilated.  4. Moderate mitral valve regurgitation.  5. There is mild dilatation of the ascending aorta measuring 41 mm.    Neuro/Psych negative neurological ROS  negative psych ROS   GI/Hepatic negative GI ROS, Neg liver ROS,   Endo/Other  negative endocrine ROS  Renal/GU negative Renal ROS  negative  genitourinary   Musculoskeletal   Abdominal   Peds  Hematology negative hematology ROS (+)   Anesthesia Other Findings Past Medical History: No date: (HFpEF) heart failure with improved ejection fraction ([place])     Comment:  a. 08/2017 Echo: EF 45-50%; b. 10/2019 Echo: EF 55-60%. No              rwma, mild LVH, Gr2 DD, RVSP 47.86mmHg. Mod dil LA. Mod               MR. Asc Ao 16mm. No date: Arthritis No date: Benign prostate hyperplasia     Comment:  lower urinary tract symptoms No date: Cataracts, bilateral No date: COPD (chronic obstructive pulmonary disease) (HCC) No date: DDD (degenerative disc disease), lumbar No date: Glaucoma No date: H/O carpal tunnel syndrome 1953: History of osteomyelitis     Comment:  right leg No date: Hypertension No date: Moderate mitral regurgitation     Comment:  a. 10/2019 Echo: Mod MR. No date: PAD (peripheral artery disease) ([place])     Comment:  a. 08/2017 LE ABI & Duplex: ABI R 0.60, L 1.07. 30-49% R               popliteal and 50-74% R PT stenoses. No date: Thrombocytopenia (HCC)   Reproductive/Obstetrics negative OB ROS                           Anesthesia Physical Anesthesia Plan  ASA: III  Anesthesia Plan: Spinal   Post-op Pain Management:    Induction:   PONV Risk Score and Plan: 1 and Propofol infusion and TIVA  Airway Management Planned: Natural Airway and Simple Face Mask  Additional Equipment:  Intra-op Plan:   Post-operative Plan:   Informed Consent: I have reviewed the patients History and Physical, chart, labs and discussed the procedure including the risks, benefits and alternatives for the proposed anesthesia with the patient or authorized representative who has indicated his/her understanding and acceptance.     Dental Advisory Given  Plan Discussed with: Anesthesiologist, CRNA and Surgeon  Anesthesia Plan Comments:        Anesthesia Quick Evaluation

## 2020-03-03 NOTE — Progress Notes (Signed)
Initial Nutrition Assessment  DOCUMENTATION CODES:   Not applicable  INTERVENTION:  Once diet is advanced provide Ensure Enlive po BID, each supplement provides 350 kcal and 20 grams of protein. Patient prefers chocolate.  NUTRITION DIAGNOSIS:   Inadequate oral intake related to decreased appetite as evidenced by per patient/family report.  GOAL:   Patient will meet greater than or equal to 90% of their needs  MONITOR:   PO intake, Supplement acceptance, Diet advancement, Labs, Weight trends, Skin, I & O's  REASON FOR ASSESSMENT:   Malnutrition Screening Tool, Consult Assessment of nutrition requirement/status  ASSESSMENT:   86 year old male with PMHx of arthritis, lumbar degenerative disc disease, glaucoma, HTN, COPD, HFpEF, moderate mitral regurgitation, PAD admitted with right femoral neck fracture (per MD's note suspect was stress fracture that is now displaced).   Met with patient at bedside. He reports decreased appetite for the past 3 months. He eats 2 meals per day. His first meal may be a donut with orange juice. Second meal may be chicken with potatoes. He reports he used to eat more at meals. Discussed importance of adequate nutrition intake, especially for post-operative healing. Patient is amenable to drinking oral nutrition supplements once diet is advanced to help meet calorie/protein needs and prefers chocolate flavor. Also encouraged intake of more calories and protein at meals. Discussed foods that contain protein. Patient currently NPO for OR today.  Patient reports his UBW was 220 lbs and he has lost weight over the past 3 months. He reports he has been using a wheelchair over the last few months and is not sure exactly when the fracture occurred. Per review of chart patient has been weight-stable for >1 year so weight loss likely happened slowly over time.  Medications reviewed and include: Lasix 20 mg daily, lisinopril, cefazolin.  Labs reviewed: Sodium 130,  Chloride 96.  Patient does not meet criteria for malnutrition at this time.  NUTRITION - FOCUSED PHYSICAL EXAM:    Most Recent Value  Orbital Region No depletion  Upper Arm Region Mild depletion  Thoracic and Lumbar Region No depletion  Buccal Region No depletion  Temple Region No depletion  Clavicle Bone Region No depletion  Clavicle and Acromion Bone Region No depletion  Scapular Bone Region No depletion  Dorsal Hand No depletion  Patellar Region No depletion  Anterior Thigh Region No depletion  Posterior Calf Region Moderate depletion  [moderate depletion in right leg but only mild depletion in left leg]  Edema (RD Assessment) Mild  Hair Reviewed  Eyes Reviewed  Mouth Reviewed  Skin Reviewed  Nails Reviewed     Diet Order:   Diet Order            Diet NPO time specified  Diet effective now                EDUCATION NEEDS:   No education needs have been identified at this time  Skin:  Skin Assessment: Skin Integrity Issues: Skin Integrity Issues:: Other (Comment) Other: wound to right ankle (2.7cm x 2.4cm)  Last BM:  Unknown  Height:   Ht Readings from Last 1 Encounters:  03/03/20 '5\' 10"'  (1.778 m)   Weight:   Wt Readings from Last 1 Encounters:  03/03/20 91.7 kg   BMI:  Body mass index is 29.01 kg/m.  Estimated Nutritional Needs:   Kcal:  2200-2400  Protein:  110-120 grams  Fluid:  2 L/day  Jacklynn Barnacle, MS, RD, LDN Pager number available on Amion

## 2020-03-03 NOTE — Consult Note (Signed)
Reason for Consult:right femoral neck fracture  Referring Physician: Dr Lula Olszewski is an 86 y.o. male.  HPI: no direct trauma, steady increasing pain. Has MRI and CT that shows fracture, suspect this was stress fracture that has now displaced.  Past Medical History:  Diagnosis Date  . (HFpEF) heart failure with improved ejection fraction ([place])    a. 08/2017 Echo: EF 45-50%; b. 10/2019 Echo: EF 55-60%. No rwma, mild LVH, Gr2 DD, RVSP 47.57mmHg. Mod dil LA. Mod MR. Asc Ao 27mm.  . Arthritis   . Benign prostate hyperplasia    lower urinary tract symptoms  . Cataracts, bilateral   . COPD (chronic obstructive pulmonary disease) ([place])   . DDD (degenerative disc disease), lumbar   . Glaucoma   . H/O carpal tunnel syndrome   . History of osteomyelitis 1953   right leg  . Hypertension   . Moderate mitral regurgitation    a. 10/2019 Echo: Mod MR.  Marland Kitchen PAD (peripheral artery disease) ([place])    a. 08/2017 LE ABI & Duplex: ABI R 0.60, L 1.07. 30-49% R popliteal and 50-74% R PT stenoses.  . Thrombocytopenia ([place])     Past Surgical History:  Procedure Laterality Date  . CARPAL TUNNEL RELEASE Left   . CARPAL TUNNEL RELEASE Right   . COLONOSCOPY  2015   Dr. Allen Norris  . LEG SURGERY Right 1953  . SKIN GRAFT Right 05/01/2017   right foot [facility]  . TOTAL KNEE ARTHROPLASTY Right     Family History  Problem Relation Age of Onset  . Colon cancer Mother 32  . Thrombosis Father 16  . Heart disease Father        Heart valve problem  . AAA (abdominal aortic aneurysm) Father   . Hypertension Sister   . Hyperlipidemia Sister   . Hypertension Sister   . Hyperlipidemia Sister   . Hypertension Sister   . Hyperlipidemia Sister     Social History:  reports that he quit smoking about 26 years ago. His smoking use included cigarettes. He has a 30.00 pack-year smoking history. He has never used smokeless tobacco. He reports previous alcohol use. He reports that he does not use drugs.  Allergies:  No Known Allergies  Medications: I have reviewed the patient's current medications.  Results for orders placed or performed during the hospital encounter of 03/02/20 (from the past 48 hour(s))  Basic metabolic panel     Status: Abnormal   Collection Time: 03/02/20 12:28 PM  Result Value Ref Range   Sodium 132 (L) 135 - 145 mmol/L   Potassium 4.4 3.5 - 5.1 mmol/L   Chloride 96 (L) 98 - 111 mmol/L   CO2 25 22 - 32 mmol/L   Glucose, Bld 104 (H) 70 - 99 mg/dL    Comment: Glucose reference range applies only to samples taken after fasting for at least 8 hours.   BUN 19 8 - 23 mg/dL   Creatinine, Ser 0.84 0.61 - 1.24 mg/dL   Calcium 8.8 (L) 8.9 - 10.3 mg/dL   GFR, Estimated >60 >60 mL/min    Comment: (NOTE) Calculated using the CKD-EPI Creatinine Equation (2021)    Anion gap 11 5 - 15    Comment: Performed at [facility], [address] [place], [place] [address]  CBC     Status: Abnormal   Collection Time: 03/02/20 12:28 PM  Result Value Ref Range   WBC 9.9 4.0 - 10.5 K/uL   RBC 3.63 (L)  4.22 - 5.81 MIL/uL   Hemoglobin 11.6 (L) 13.0 - 17.0 g/dL   HCT 35.5 (L) 39 - 52 %   MCV 97.8 80.0 - 100.0 fL   MCH 32.0 26.0 - 34.0 pg   MCHC 32.7 30.0 - 36.0 g/dL   RDW 21.5 (H) 11.5 - 15.5 %   Platelets 158 150 - 400 K/uL   nRBC 1.1 (H) 0.0 - 0.2 %    Comment: Performed at [facility], [address] [place], [place] [address]  Urinalysis, Complete w Microscopic Urine, Clean Catch     Status: Abnormal   Collection Time: 03/02/20 12:28 PM  Result Value Ref Range   Color, Urine YELLOW (A) YELLOW   APPearance CLEAR (A) CLEAR   Specific Gravity, Urine 1.008 1.005 - 1.030   pH 6.0 5.0 - 8.0   Glucose, UA NEGATIVE NEGATIVE mg/dL   Hgb urine dipstick NEGATIVE NEGATIVE   Bilirubin Urine NEGATIVE NEGATIVE   Ketones, ur NEGATIVE NEGATIVE mg/dL   Protein, ur NEGATIVE NEGATIVE mg/dL   Nitrite NEGATIVE NEGATIVE   Leukocytes,Ua NEGATIVE NEGATIVE   RBC / HPF 0-5 0 - 5 RBC/hpf    WBC, UA 0-5 0 - 5 WBC/hpf   Bacteria, UA NONE SEEN NONE SEEN   Squamous Epithelial / LPF 0-5 0 - 5   Mucus PRESENT    Hyaline Casts, UA PRESENT     Comment: Performed at [facility], [address] [place], [place] [address]  Brain natriuretic peptide     Status: Abnormal   Collection Time: 03/03/20 12:20 AM  Result Value Ref Range   B Natriuretic Peptide 540.9 (H) 0.0 - 100.0 pg/mL    Comment: Performed at [facility], [address], [place] [address]  Troponin I (High Sensitivity)     Status: Abnormal   Collection Time: 03/03/20 12:20 AM  Result Value Ref Range   Troponin I (High Sensitivity) 49 (H) <18 ng/L    Comment: (NOTE) Elevated high sensitivity troponin I (hsTnI) values and significant  changes across serial measurements may suggest ACS but many other  chronic and acute conditions are known to elevate hsTnI results.  Refer to the "Links" section for chest pain algorithms and additional  guidance. Performed at [facility], [address] [place], [place] [address]   TSH     Status: None   Collection Time: 03/03/20 12:20 AM  Result Value Ref Range   TSH 4.324 0.350 - 4.500 uIU/mL    Comment: Performed by a 3rd Generation assay with a functional sensitivity of <=0.01 uIU/mL. Performed at [facility], [address] [place], [place] [address]   Respiratory Panel by RT PCR (Flu A&B, Covid) - Nasopharyngeal Swab     Status: None   Collection Time: 03/03/20 12:23 AM   Specimen: Nasopharyngeal Swab  Result Value Ref Range   SARS Coronavirus 2 by RT PCR NEGATIVE NEGATIVE    Comment: (NOTE) SARS-CoV-2 target nucleic acids are NOT DETECTED.  The SARS-CoV-2 RNA is generally detectable in upper respiratoy specimens during the acute phase of infection. The lowest concentration of SARS-CoV-2 viral copies this assay can detect is 131 copies/mL. A negative result does not preclude SARS-Cov-2 infection and should not be  used as the sole basis for treatment or other patient management decisions. A negative result may occur with  improper specimen collection/handling, submission of specimen other than nasopharyngeal swab, presence of viral mutation(s) within the areas targeted by this assay, and inadequate number  of viral copies (<131 copies/mL). A negative result must be combined with clinical observations, patient history, and epidemiological information. The expected result is Negative.  Fact Sheet for Patients:  PinkCheek.be  Fact Sheet for Healthcare Providers:  GravelBags.it  This test is no t yet approved or cleared by the [place] FDA and  has been authorized for detection and/or diagnosis of SARS-CoV-2 by FDA under an Emergency Use Authorization (EUA). This EUA will remain  in effect (meaning this test can be used) for the duration of the COVID-19 declaration under Section 564(b)(1) of the Act, 21 U.S.C. section 360bbb-3(b)(1), unless the authorization is terminated or revoked sooner.     Influenza A by PCR NEGATIVE NEGATIVE   Influenza B by PCR NEGATIVE NEGATIVE    Comment: (NOTE) The Xpert Xpress SARS-CoV-2/FLU/RSV assay is intended as an aid in  the diagnosis of influenza from Nasopharyngeal swab specimens and  should not be used as a sole basis for treatment. Nasal washings and  aspirates are unacceptable for Xpert Xpress SARS-CoV-2/FLU/RSV  testing.  Fact Sheet for Patients: PinkCheek.be  Fact Sheet for Healthcare Providers: GravelBags.it  This test is not yet approved or cleared by the [place] FDA and  has been authorized for detection and/or diagnosis of SARS-CoV-2 by  FDA under an Emergency Use Authorization (EUA). This EUA will remain  in effect (meaning this test can be used) for the duration of the  Covid-19 declaration under Section 564(b)(1) of  the Act, 21  U.S.C. section 360bbb-3(b)(1), unless the authorization is  terminated or revoked. Performed at [facility], [address] [place], [place] [address]   CBC     Status: Abnormal   Collection Time: 03/03/20 12:25 AM  Result Value Ref Range   WBC 10.4 4.0 - 10.5 K/uL   RBC 3.38 (L) 4.22 - 5.81 MIL/uL   Hemoglobin 10.9 (L) 13.0 - 17.0 g/dL   HCT 33.1 (L) 39 - 52 %   MCV 97.9 80.0 - 100.0 fL   MCH 32.2 26.0 - 34.0 pg   MCHC 32.9 30.0 - 36.0 g/dL   RDW 21.5 (H) 11.5 - 15.5 %   Platelets 123 (L) 150 - 400 K/uL    Comment: Immature Platelet Fraction may be clinically indicated, consider ordering this additional test [id number]    nRBC 0.3 (H) 0.0 - 0.2 %    Comment: Performed at [facility], [address] [place], [place] [address]  Basic metabolic panel     Status: Abnormal   Collection Time: 03/03/20 12:25 AM  Result Value Ref Range   Sodium 130 (L) 135 - 145 mmol/L   Potassium 4.2 3.5 - 5.1 mmol/L   Chloride 96 (L) 98 - 111 mmol/L   CO2 24 22 - 32 mmol/L   Glucose, Bld 129 (H) 70 - 99 mg/dL    Comment: Glucose reference range applies only to samples taken after fasting for at least 8 hours.   BUN 20 8 - 23 mg/dL   Creatinine, Ser 0.78 0.61 - 1.24 mg/dL   Calcium 8.5 (L) 8.9 - 10.3 mg/dL   GFR, Estimated >60 >60 mL/min    Comment: (NOTE) Calculated using the CKD-EPI Creatinine Equation (2021)    Anion gap 10 5 - 15    Comment: Performed at [facility], [address], [place],  [address]  Surgical PCR screen     Status: None   Collection Time: 03/03/20  1:55 AM   Specimen: Nasal Mucosa; Nasal Swab  Result Value Ref Range   MRSA, PCR NEGATIVE NEGATIVE   Staphylococcus aureus NEGATIVE NEGATIVE    Comment: (NOTE) The Xpert SA Assay (FDA approved for NASAL specimens in patients 30 years of age and older), is one component of a comprehensive surveillance program. It is not intended to diagnose infection nor to guide or  monitor treatment. Performed at [facility], [address] [place], [place] [address]     DG Chest 2 View  Result Date: 03/02/2020 CLINICAL DATA:  Shortness of breath EXAM: CHEST - 2 VIEW COMPARISON:  October 12, 2019 FINDINGS: There is a small pleural effusion on each side with mild bibasilar atelectasis. The lungs elsewhere are clear. There is cardiomegaly with pulmonary vascularity normal. No adenopathy. There is aortic atherosclerosis. There is leftward deviation of the upper thoracic trachea. Bones appear osteoporotic. IMPRESSION: 1. Small pleural effusions bilaterally with bibasilar atelectasis. Lungs elsewhere clear. 2.  Cardiomegaly with pulmonary vascularity normal. 3. Deviation of the upper thoracic trachea toward the left. Question thyroid enlargement as etiology for this finding. 4.  Aortic Atherosclerosis (ICD10-I70.0). Electronically Signed   By: Lowella Grip III M.D.   On: 03/02/2020 13:03   CT PELVIS WO CONTRAST  Result Date: 03/02/2020 CLINICAL DATA:  Hip pain for 10 days EXAM: CT PELVIS WITHOUT CONTRAST TECHNIQUE: Multidetector CT imaging of the pelvis was performed following the standard protocol without intravenous contrast. COMPARISON:  Radiograph 03/02/2020 FINDINGS: Urinary Tract:  Urinary bladder is unremarkable. Bowel: Sigmoid colon diverticula without acute inflammatory process. Negative appendix. No bowel wall thickening Vascular/Lymphatic: Extensive aortic atherosclerosis without aneurysm. No suspicious nodes Reproductive:  Mild prostate calcification without enlargement Other:  Negative for pelvic effusion Musculoskeletal: Mild degenerative changes of both hips with joint space narrowing and asymmetric femoral head neck junction. Chronic appearing deformity at the left iliac crest potentially due to remote trauma or postsurgical change. There is no fracture or associated soft tissue mass. There is fatty atrophy of adjacent gluteus musculature. No femoral head  dislocation. Coronal images are suspicious for acute appearing nondisplaced right femoral neck fracture. Pubic symphysis and rami appear intact. IMPRESSION: 1. No definite acute osseous abnormality of left hip. Chronic appearing osseous deformity of left iliac crest without evidence for bony destructive change or associated soft tissue mass. 2. Findings are suspicious for an acute nondisplaced right femoral neck fracture 3. Sigmoid colon diverticular disease without acute inflammatory change. Aortic Atherosclerosis (ICD10-I70.0). Electronically Signed   By: Donavan Foil M.D.   On: 03/02/2020 16:33   MR PELVIS WO CONTRAST  Result Date: 03/02/2020 CLINICAL DATA:  Right hip pain EXAM: MRI PELVIS WITHOUT CONTRAST TECHNIQUE: Multiplanar multisequence MR imaging of the pelvis was performed. No intravenous contrast was administered. COMPARISON:  CT 3:54 p.m., plain radiographs 12:36 p.m. FINDINGS: Urinary Tract: The distal ureters are decompressed. The bladder is unremarkable. Bowel: The visualized large and small bowel are unremarkable per save for severe sigmoid diverticulosis. Vascular/Lymphatic: No pathologic adenopathy within the abdomen and pelvis Reproductive:  No mass or other significant abnormality Other:  None significant Musculoskeletal: There is an acute, impacted subcapital right femoral neck fracture with mild varus angulation of the distal fracture fragment. The femoral head is still seated within the right acetabulum. Iliofemoral in pubic femoral ligaments are intact. Ligamentum teres is intact. There is mild bilateral degenerative hip arthritis. There is injury signal within the right gluteal musculature compatible with mild strain. Similar changes are noted within the left gluteus maximus muscle. There is increased signal compatible with greater trochanteric  bursitis on the right. There is increased signal within the visualized proximal vastus lateralis and intermedius compatible with muscle  strain. No frank avulsion. There is irregularity of the left iliac crest without associated marrow edema or soft tissue mass most in keeping with mod trauma or surgical intervention. IMPRESSION: Acute, impacted, mildly angulated right subcapital femoral neck fracture Increased signal in keeping with muscular strain involving the a right gluteal musculature as well as the vastus lateralis and intermedius. No frank tendinous avulsion. Mild superimposed bilateral hip degenerative arthritis. Electronically Signed   By: Fidela Salisbury MD   On: 03/02/2020 22:36   DG Hip Unilat With Pelvis 2-3 Views Left  Result Date: 03/02/2020 CLINICAL DATA:  Pain EXAM: DG HIP (WITH OR WITHOUT PELVIS) 2-3V LEFT COMPARISON:  None. FINDINGS: Frontal pelvis as well as frontal and lateral left hip images were obtained. No fracture or dislocation. There is mild symmetric narrowing of each hip joint. There is moderate osteoarthritic change in the right sacroiliac joint. There is also degenerative change in the lower lumbar spine. No erosions. There is bony overgrowth along the left lateral iliac crest. IMPRESSION: Mild symmetric narrowing each hip joint. 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 Bony overgrowth along the lateral left iliac crest measuring 8.8 x 3.3 cm. Question prior trauma in this area. Neoplastic etiology for this somewhat unusual appearance must be questioned. Nonemergent CT of the pelvis to further evaluate this region is felt to be warranted. No acute fracture or dislocation. Electronically Signed   By: Lowella Grip III M.D.   On: 03/02/2020 13:07    Review of Systems Blood pressure 126/67, pulse 92, temperature 98 F (36.7 C), temperature source Oral, resp. rate 17, height 5\' 10"  (1.778 m), weight 91.7 kg, SpO2 95 %. Physical Exam Right leg externally rotated. Sore to right foot. proir right TKA Assessment/Plan: Right femoral neck fracture Plan hip hemiarthoplasty  Hessie Knows 03/03/2020, 9:59 AM

## 2020-03-03 NOTE — Op Note (Signed)
03/03/2020  1:39 PM  PATIENT:  Raymond Barnett  86 y.o. male  PRE-OPERATIVE DIAGNOSIS:  Right hip fracture Femoral neck displaced POST-OPERATIVE DIAGNOSIS:  Right hip fracture femoral neck displaced  PROCEDURE:  Procedure(s): ARTHROPLASTY BIPOLAR HIP (HEMIARTHROPLASTY) (Right)  SURGEON: Laurene Footman, MD  ASSISTANTS: None  ANESTHESIA:   spinal  EBL:  Total I/O In: 1010 [I.V.:1000; IV Piggyback:10] Out: 550 [Urine:400; Blood:150]  BLOOD ADMINISTERED:none  DRAINS: Incisional wound VAC   LOCAL MEDICATIONS USED:  MARCAINE     SPECIMEN:  Source of Specimen:  Right femoral head and neck fragments  DISPOSITION OF SPECIMEN:  PATHOLOGY  COUNTS:  YES  TOURNIQUET:  * No tourniquets in log *  IMPLANTS: Medacta cemented size 6 standard AMIS stem with 7 DePuy cement plug S metal 28 mm head and 54 mm bipolar head.  DICTATION: Viviann Spare Dictation   The patient was brought to the operating room and after spinal anesthesia was obtained patient was placed on the operative table with the ipsilateral foot into the Medacta attachment, contralateral leg on a well-padded table. C-arm was brought in and preop template x-ray taken. After prepping and draping in usual sterile fashion appropriate patient identification and timeout procedures were completed. Anterior approach to the hip was obtained and centered over the greater trochanter and TFL muscle. The subcutaneous tissue was incised hemostasis being achieved by electrocautery. TFL fascia was incised and the muscle retracted laterally deep retractor placed. The lateral femoral circumflex vessels were identified and ligated. The anterior capsule was exposed and a capsulotomy performed.  Fracture site was identified and cut was made distal to this at the usual level.  The neck was identified and a femoral neck cut carried out with a saw. The head was removed without difficulty and showed minimal degenerative changes to the femoral head and acetabulum.   The head was sized to a 54 and a 54 bipolar trial was placed and fit well.  The leg was then externally rotated and ischiofemoral and pubofemoral releases carried out. The femur was sequentially broached to a size 7,  the final components chosen. The 6 cemented stem was inserted after drying the canal inserting cement plug and then inserting cement with pressurization.  After the cement was set the metal S 28 mm head and 54 mm bipolar liner. The hip was reduced and was stable the wound was thoroughly irrigated with fibrillar placed along the posterior capsule and medial neck. The deep fascia ws closed using a heavy Quill after infiltration of 30 cc of quarter percent Sensorcaine with epinephrine.3-0 V-loc to close the skin with skin staples.  Incisional wound VAC applied and patient was sent to recovery in stable condition.   PLAN OF CARE: Admit to inpatient

## 2020-03-03 NOTE — Consult Note (Signed)
Cardiology Consultation:   Patient ID: Raymond Barnett MRN: [id number]; DOB: June 20, 1929  Admit date: 03/02/2020 Date of Consult: 03/03/2020  Primary Care Provider: [name], DO Primary Cardiologist: Nelva Bush, MD  Primary Electrophysiologist:  None    Patient Profile:   Raymond Barnett is a 86 y.o. male with a hx of chronic dyspnea, heart failure with improved ejection fraction (55-60 10/2019), peripheral arterial disease with abnormal right lower extremity ABIs, remote osteomyelitis, COPD, thrombocytopenia, BPH, arthritis, prior right TKA, hypertension, and who is being seen today for the evaluation of preoperative cardiovascular evaluation prior to right hip hemiarthroplasty 2/2 right femoral neck fracture at the request of Dr. Damita Dunnings.  History of Present Illness:   Raymond Barnett is an 86 year old male with PMH as above.  He was evaluated 08/2017 in the setting of dyspnea and poor healing right lower extremity wound.  EF 45 to 50% and question of inferior wall motion abnormality.  Right lower extremity arterial duplex ABIs were abnormal, suggesting right popliteal and right posterior tibial stenoses.  Additional cardiac testing, as well as abdominal aortogram with peripheral runoff were recommended but deferred by patient.  He was seen at follow-up and continued to defer work-up with preference for medical management.  11/11/2019 echo showed EF 55 to 60%, mild LVH, G2 DD, RVSP 47.7 mmHg, moderate LAE, moderate MR, mild dilation of the ascending aorta measuring 41 mm.  He was last seen in the office 12/29/2019 by his primary cardiologist, [name], at which time he reported his breathing was improved and back to baseline though still not normal.  He noted occasional chest pressure when he panicked.  He still had a small wound on his right calf that had yet to completely heal.  Due to a bump in kidney function, furosemide was decreased to 20 mg daily.  It was noted that, if he  were to become short of breath again, he could increase back up to 40 mg daily and let us know.  He was encouraged to minimize sodium intake.  He continue to decline further ischemic work-up.  It was noted that he was not on a statin, though LDL was 79.  He had previously self discontinued statin therapy and did not wish to restart it.  It was reported that, if further chest pain, we could consider low-dose beta-blocker.  On 03/02/2020, he presented to [facility] ED with persistent left hip pain that affected his ability to ambulate, though it is noted that he only ambulates with an electric wheelchair.  The pain had been progressive over the last couple of months.  He also noted increasing shortness of breath/DOE over the last few months, as well as lower extremity edema.  He reported difficulty swallowing and hoarseness.  Overall, he noted compliance with his oral Lasix; however, he had skipped a couple of days of Lasix due to needing to run errands.  He stated that he was unable to take his Lasix on days that he was busy, due to needing to use the restroom.  In the ED, initial vitals significant for HR 88 bpm, BP 157/84, 91% on room air.  Chest x-ray showed small pleural effusions bilaterally with bibasilar atelectasis, cardiomegaly, deviation of the upper thoracic trachea towards the left with questionable thyroid enlargement as etiology for this finding, and aortic atherosclerosis.  Hip x-ray showed bony overgrowth along the lateral left iliac crest, measuring 8.8 x 3.3 cm.  CT hip noted findings suspicious for acute nondisplaced right femoral neck fracture.  MR hip noted mild angulated right subcapital femoral neck fracture, as well as mild superimposed bilateral hip degenerative arthritis.  He was evaluated by orthopedic surgery with recommendation for right hip hemiarthroplasty. Today, 03/03/2020, he has been evaluated by [facility] cardiology for preoperative cardiac evaluation prior to hip surgery.  He denies any  recent recurrent chest pain.  He reports that he has felt shortness of breath but is relatively sedentary at baseline.  With restart of Lasix, he has felt that his breathing has been improving.  He uses a motorized scooter to get around and has for some time now and does admit to significant deconditioning, especially given his discomfort associated with this hip pain.  Further recommendations as below.  Heart Pathway Score:     Past Medical History:  Diagnosis Date  . (HFpEF) heart failure with improved ejection fraction ([place])    a. 08/2017 Echo: EF 45-50%; b. 10/2019 Echo: EF 55-60%. No rwma, mild LVH, Gr2 DD, RVSP 47.39mHg. Mod dil LA. Mod MR. Asc Ao 468m  . Arthritis   . Benign prostate hyperplasia    lower urinary tract symptoms  . Cataracts, bilateral   . COPD (chronic obstructive pulmonary disease) [place]  . DDD (degenerative disc disease), lumbar   . Glaucoma   . H/O carpal tunnel syndrome   . History of osteomyelitis 1953   right leg  . Hypertension   . Moderate mitral regurgitation    a. 10/2019 Echo: Mod MR.  . Marland KitchenAD (peripheral artery disease) ([place]   a. 08/2017 LE ABI & Duplex: ABI R 0.60, L 1.07. 30-49% R popliteal and 50-74% R PT stenoses.  . Thrombocytopenia ([place]    Past Surgical History:  Procedure Laterality Date  . CARPAL TUNNEL RELEASE Left   . CARPAL TUNNEL RELEASE Right   . COLONOSCOPY  2015   Dr. [name]. LEG SURGERY Right 1953  . SKIN GRAFT Right [facility]01/2017   [facility] TOTAL KNEE ARTHROPLASTY Right      Home Medications:  Prior to Admission medications   Medication Sig Start Date End Date Taking? Authorizing Provider  ASPIRIN LOW DOSE 81 MG chewable tablet Chew 81 mg by mouth daily. 07/17/19  Yes [provider]  Calcium Carb-Cholecalciferol (CALCIUM 600 + D) 600-200 MG-UNIT TABS Take 1 tablet by mouth daily. Patient takes 1000 mg daily.   Yes [provider]  cholecalciferol (VITAMIN D) 25 MCG (1000 UNIT) tablet Take 1,000  Units by mouth daily. 12/15/19  Yes [provider]  finasteride (PROSCAR) 5 MG tablet Take 5 mg by mouth daily.   Yes [provider]  fluticasone (FLONASE) 50 MCG/ACT nasal spray Place 2 sprays into both nostrils daily. Use for 4-6 weeks then stop and use seasonally or as needed. Patient taking differently: Place 2 sprays into both nostrils as directed. Use for 4-6 weeks then stop and use seasonally or as needed. 11/09/19  Yes [name], AlDevonne DoughtyDO  furosemide (LASIX) 20 MG tablet Take 1 tablet (20 mg total) by mouth daily. 12/30/19 03/29/20 Yes End, ChHarrell GaveMD  Lidocaine-Hydrocortisone Ace 2-2 % KIT Use rectal suppository twice daily as needed for up to 1 week for hemorrhoids 06/18/17  Yes [name], [name], DO  lisinopril (ZESTRIL) 10 MG tablet Take 5 mg by mouth daily. 01/07/20  Yes [provider]  loratadine (CLARITIN) 10 MG tablet Take 1 tablet (10 mg total) by mouth daily. 11/09/19  Yes [name], AlDevonne DoughtyDO  LUBRICATING PLUS  EYE DROPS 0.5 % SOLN Apply 1 application to eye as needed (dry eyes).  08/25/19  Yes [provider]  naproxen (NAPROSYN) 500 MG tablet Take 1 tablet (500 mg total) by mouth 2 (two) times daily as needed. 11/16/19  Yes [name], [name], DO  OXYGEN Inhale 2 L into the lungs at bedtime.   Yes [provider]  timolol (TIMOPTIC) 0.5 % ophthalmic solution Place 1 drop into both eyes 2 (two) times daily.    Yes [provider]  traZODone (DESYREL) 100 MG tablet Take 1 tablet (100 mg total) by mouth at bedtime. 12/22/19  Yes [name], DO    Inpatient Medications: Scheduled Meds: . Budeson-Glycopyrrol-Formoterol  2 puff Inhalation BID  . finasteride  5 mg Oral Daily  . furosemide  20 mg Oral Daily  . lisinopril  5 mg Oral Daily   Continuous Infusions: .  ceFAZolin (ANCEF) IV     PRN Meds: HYDROcodone-acetaminophen, ipratropium-albuterol, morphine injection,  traZODone  Allergies:   No Known Allergies  Social History:   Social History   Socioeconomic History  . Marital status: Married    Spouse name: Not on file  . Number of children: Not on file  . Years of education: Not on file  . Highest education level: High school graduate  Occupational History  . Not on file  Tobacco Use  . Smoking status: Former Smoker    Packs/day: 1.00    Years: 30.00    Pack years: 30.00    Types: Cigarettes    Quit date: 1995    Years since quitting: 26.8  . Smokeless tobacco: Never Used  Vaping Use  . Vaping Use: Never used  Substance and Sexual Activity  . Alcohol use: Not Currently    Comment: occasionally wine   . Drug use: No  . Sexual activity: Not Currently  Other Topics Concern  . Not on file  Social History Narrative  . Not on file   Social Determinants of Health   Financial Resource Strain:   . Difficulty of Paying Living Expenses: Not on file  Food Insecurity:   . Worried About Charity fundraiser in the Last Year: Not on file  . Ran Out of Food in the Last Year: Not on file  Transportation Needs:   . Lack of Transportation (Medical): Not on file  . Lack of Transportation (Non-Medical): Not on file  Physical Activity:   . Days of Exercise per Week: Not on file  . Minutes of Exercise per Session: Not on file  Stress:   . Feeling of Stress : Not on file  Social Connections:   . Frequency of Communication with Friends and Family: Not on file  . Frequency of Social Gatherings with Friends and Family: Not on file  . Attends Religious Services: Not on file  . Active Member of Clubs or Organizations: Not on file  . Attends Archivist Meetings: Not on file  . Marital Status: Not on file  Intimate Partner Violence:   . Fear of Current or Ex-Partner: Not on file  . Emotionally Abused: Not on file  . Physically Abused: Not on file  . Sexually Abused: Not on file    Family History:    Family History  Problem Relation  Age of Onset  . Colon cancer Mother 58  . Thrombosis Father 67  . Heart disease Father        Heart valve problem  . AAA (abdominal aortic aneurysm)  Father   . Hypertension Sister   . Hyperlipidemia Sister   . Hypertension Sister   . Hyperlipidemia Sister   . Hypertension Sister   . Hyperlipidemia Sister      ROS:  Please see the history of present illness.  Review of Systems  HENT:       Some hoarseness of his voice, difficulty swallowing  Respiratory: Positive for shortness of breath.   Cardiovascular: Positive for leg swelling. Negative for chest pain, palpitations and orthopnea.  Musculoskeletal: Positive for joint pain and myalgias.       Left hip pain with right hip fracture    All other ROS reviewed and negative.     Physical Exam/Data:   Vitals:   03/03/20 0100 03/03/20 0122 03/03/20 0555 03/03/20 0816  BP: (!) 143/83 (!) 148/91 103/70 126/67  Pulse: 77 90 99 92  Resp: 18   17  Temp:   98.7 F (37.1 C) 98 F (36.7 C)  TempSrc:   Oral Oral  SpO2: 94% 94% (!) 89% 95%  Weight:  91.7 kg    Height:  _0  (1.778 m)      Intake/Output Summary (Last 24 hours) at 03/03/2020 1112 Last data filed at 03/03/2020 1028 Gross per 24 hour  Intake --  Output 250 ml  Net -250 ml   Last 3 Weights 03/03/2020 03/02/2020 03/02/2020  Weight (lbs) 202 lb 2.6 oz 200 lb 205 lb  Weight (kg) 91.7 kg 90.719 kg 92.987 kg     Body mass index is 29.01 kg/m.  General:  Well nourished, well developed, in no acute distress  HEENT: normal Neck: no JVD Vascular: No carotid bruits; radial pulses 2+ bilaterally Cardiac:  normal S1, S2; RRR; no murmur  Lungs: Bibasilar reduced breath sounds, no wheezing, rhonchi or rales  Abd: soft, nontender, no hepatomegaly  Ext: 1+ bilateral lower extremity edema Musculoskeletal:  No deformities, BUE and BLE strength normal and equal Skin: warm and dry  Neuro:  No focal abnormalities noted Psych:  Normal affect   EKG:  The EKG was personally  reviewed and demonstrates: NSR, RBBB (known), inferior changes not new and seen on previous EKGs Telemetry:  Telemetry was personally reviewed and demonstrates: NSR, seventies to nineties  Relevant CV Studies: Echo 11/11/2019 1. Left ventricular ejection fraction, by estimation, is 55 to 60%. The  left ventricle has normal function. The left ventricle has no regional  wall motion abnormalities. There is mild left ventricular hypertrophy.  Left ventricular diastolic parameters  are consistent with Grade II diastolic dysfunction (pseudonormalization).  2. Right ventricular systolic function is normal. The right ventricular  size is normal. There is moderately elevated pulmonary artery systolic  pressure. The estimated right ventricular systolic pressure is 20.6 mmHg.  3. Left atrial size was moderately dilated.  4. Moderate mitral valve regurgitation.  5. There is mild dilatation of the ascending aorta measuring 41 mm.   Laboratory Data:  High Sensitivity Troponin:   Recent Labs  Lab 03/03/20 0020  TROPONINIHS 49*     Cardiac EnzymesNo results for input(s): TROPONINI in the last 168 hours. No results for input(s): TROPIPOC in the last 168 hours.  Chemistry Recent Labs  Lab 03/02/20 1228 03/03/20 0025  NA 132* 130*  K 4.4 4.2  CL 96* 96*  CO2 25 24  GLUCOSE 104* 129*  BUN 19 20  CREATININE 0.84 0.78  CALCIUM 8.8* 8.5*  GFRNONAA >60 >60  ANIONGAP 11 10    No results for input(s): PROT,  ALBUMIN, AST, ALT, ALKPHOS, BILITOT in the last 168 hours. Hematology Recent Labs  Lab 03/02/20 1228 03/03/20 0025  WBC 9.9 10.4  RBC 3.63* 3.38*  HGB 11.6* 10.9*  HCT 35.5* 33.1*  MCV 97.8 97.9  MCH 32.0 32.2  MCHC 32.7 32.9  RDW 21.5* 21.5*  PLT 158 123*   BNP Recent Labs  Lab 03/03/20 0020  BNP 540.9*    DDimer No results for input(s): DDIMER in the last 168 hours.   Radiology/Studies:  DG Chest 2 View  Result Date: 03/02/2020 CLINICAL DATA:  Shortness of breath  EXAM: CHEST - 2 VIEW COMPARISON:  October 12, 2019 FINDINGS: There is a small pleural effusion on each side with mild bibasilar atelectasis. The lungs elsewhere are clear. There is cardiomegaly with pulmonary vascularity normal. No adenopathy. There is aortic atherosclerosis. There is leftward deviation of the upper thoracic trachea. Bones appear osteoporotic. IMPRESSION: 1. Small pleural effusions bilaterally with bibasilar atelectasis. Lungs elsewhere clear. 2.  Cardiomegaly with pulmonary vascularity normal. 3. Deviation of the upper thoracic trachea toward the left. Question thyroid enlargement as etiology for this finding. 4.  Aortic Atherosclerosis (ICD10-I70.0). Electronically Signed   By: Lowella Grip III M.D.   On: 03/02/2020 13:03   CT PELVIS WO CONTRAST  Result Date: 03/02/2020 CLINICAL DATA:  Hip pain for 10 days EXAM: CT PELVIS WITHOUT CONTRAST TECHNIQUE: Multidetector CT imaging of the pelvis was performed following the standard protocol without intravenous contrast. COMPARISON:  Radiograph 03/02/2020 FINDINGS: Urinary Tract:  Urinary bladder is unremarkable. Bowel: Sigmoid colon diverticula without acute inflammatory process. Negative appendix. No bowel wall thickening Vascular/Lymphatic: Extensive aortic atherosclerosis without aneurysm. No suspicious nodes Reproductive:  Mild prostate calcification without enlargement Other:  Negative for pelvic effusion Musculoskeletal: Mild degenerative changes of both hips with joint space narrowing and asymmetric femoral head neck junction. Chronic appearing deformity at the left iliac crest potentially due to remote trauma or postsurgical change. There is no fracture or associated soft tissue mass. There is fatty atrophy of adjacent gluteus musculature. No femoral head dislocation. Coronal images are suspicious for acute appearing nondisplaced right femoral neck fracture. Pubic symphysis and rami appear intact. IMPRESSION: 1. No definite acute osseous  abnormality of left hip. Chronic appearing osseous deformity of left iliac crest without evidence for bony destructive change or associated soft tissue mass. 2. Findings are suspicious for an acute nondisplaced right femoral neck fracture 3. Sigmoid colon diverticular disease without acute inflammatory change. Aortic Atherosclerosis (ICD10-I70.0). Electronically Signed   By: Donavan Foil M.D.   On: 03/02/2020 16:33   MR PELVIS WO CONTRAST  Result Date: 03/02/2020 CLINICAL DATA:  Right hip pain EXAM: MRI PELVIS WITHOUT CONTRAST TECHNIQUE: Multiplanar multisequence MR imaging of the pelvis was performed. No intravenous contrast was administered. COMPARISON:  CT 3:54 p.m., plain radiographs 12:36 p.m. FINDINGS: Urinary Tract: The distal ureters are decompressed. The bladder is unremarkable. Bowel: The visualized large and small bowel are unremarkable per save for severe sigmoid diverticulosis. Vascular/Lymphatic: No pathologic adenopathy within the abdomen and pelvis Reproductive:  No mass or other significant abnormality Other:  None significant Musculoskeletal: There is an acute, impacted subcapital right femoral neck fracture with mild varus angulation of the distal fracture fragment. The femoral head is still seated within the right acetabulum. Iliofemoral in pubic femoral ligaments are intact. Ligamentum teres is intact. There is mild bilateral degenerative hip arthritis. There is injury signal within the right gluteal musculature compatible with mild strain. Similar changes are noted within the left gluteus  maximus muscle. There is increased signal compatible with greater trochanteric bursitis on the right. There is increased signal within the visualized proximal vastus lateralis and intermedius compatible with muscle strain. No frank avulsion. There is irregularity of the left iliac crest without associated marrow edema or soft tissue mass most in keeping with mod trauma or surgical intervention.  IMPRESSION: Acute, impacted, mildly angulated right subcapital femoral neck fracture Increased signal in keeping with muscular strain involving the a right gluteal musculature as well as the vastus lateralis and intermedius. No frank tendinous avulsion. Mild superimposed bilateral hip degenerative arthritis. Electronically Signed   By: Fidela Salisbury MD   On: 03/02/2020 22:36   DG Hip Unilat With Pelvis 2-3 Views Left  Result Date: 03/02/2020 CLINICAL DATA:  Pain EXAM: DG HIP (WITH OR WITHOUT PELVIS) 2-3V LEFT COMPARISON:  None. FINDINGS: Frontal pelvis as well as frontal and lateral left hip images were obtained. No fracture or dislocation. There is mild symmetric narrowing of each hip joint. There is moderate osteoarthritic change in the right sacroiliac joint. There is also degenerative change in the lower lumbar spine. No erosions. There is bony overgrowth along the left lateral iliac crest. IMPRESSION: Mild symmetric narrowing each hip joint. 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 Bony overgrowth along the lateral left iliac crest measuring 8.8 x 3.3 cm. Question prior trauma in this area. Neoplastic etiology for this somewhat unusual appearance must be questioned. Nonemergent CT of the pelvis to further evaluate this region is felt to be warranted. No acute fracture or dislocation. Electronically Signed   By: Lowella Grip III M.D.   On: 03/02/2020 13:07    Assessment and Plan:   Preoperative cardiovascular evaluation prior to right hip surgery --No chest pain.  Reports chronic shortness of breath/DOE as noted at previous visits.  Initially presented with shortness of breath, improving with restarted diuresis.  Known history of HFpEF, likely exacerbated by skipping his Lasix leading up to admission.  As below, EKG without acute ST/T changes and high-sensitivity troponin minimally elevated.  Not consistent with ACS. --No active cardiac conditions.   --Clinical risk  factors include known HFpEF. --Surgery specific risk intermediate at 1 to 5%, given orthopedic procedure. --Cr <2. No insulin needed. --RCRI Class II risk, 0.9% risk of MACE. Reasonable to proceed to surgery without further ischemic work-up at this time.  Recommend caution with IV fluids in the perioperative setting, given his known HFpEF and pulmonary hypertension. ASA may be continued throughout the perioperative period if felt safe to do so by orthopedic surgery.  If held, recommend restart of ASA once felt safe to do so by orthopedic surgery.  Elevated high-sensitivity troponin  H/o of coronary artery calcium on CT --No current chest pain.   --As above, he has previously declined outpatient ischemic work-up.   --CAC by CT.  Reviewed previous CT with rounding MD today.   --EKG without acute ST/T changes.   --HS Tn minimally elevated and flat trending.  --Not consistent with ACS.  --As above, reasonable to proceed to surgery without further ischemic work-up.   --If future chest pain in the outpatient setting in the future, reconsider further ischemic work-up at that time and once recovered from hip surgery, as if PCI performed, he will require antiplatelet therapy for at least 6-12 months. --Continue medical management.  ASA may be continued throughout the perioperative period if felt safe to do so by orthopedic surgery.  If held, recommend restart of ASA once felt safe to  do so by orthopedic surgery.  Acute on chronic HFpEF Pulmonary hypertension --Presented with shortness of breath, improving with restart of diuresis.   --10/2019 echo with EF 55 to 60%, mild LVH, G2 DD, elevated RVSP 47.7 mmHg. --Caution with IVF in the perioperative setting as above -pending right hip surgery. --Daily BMET. Cr 0.78, BUN 20. --Monitor I's/O's. -200cc, --Daily weights. --CHF education.  Anemia --Monitor very closely. --Daily CBC.  PAD --Previously noted reduced right ABIs.   --Not on a statin, per  pt preference.  --Continue ASA as outlined above and per surgery restart if held during perioperative period.    For questions or updates, please contact [place] Please consult [email or web address] for contact info under     Signed, Arvil Chaco, PA-C  03/03/2020 11:12 AM

## 2020-03-03 NOTE — Transfer of Care (Signed)
Immediate Anesthesia Transfer of Care Note  Patient: Raymond Barnett  Procedure(s) Performed: ARTHROPLASTY BIPOLAR HIP (HEMIARTHROPLASTY) (Right Hip)  Patient Location: PACU  Anesthesia Type:Spinal  Level of Consciousness: awake, alert  and oriented  Airway & Oxygen Therapy: Patient Spontanous Breathing and Patient connected to face mask oxygen  Post-op Assessment: Report given to RN and Post -op Vital signs reviewed and stable  Post vital signs: Reviewed and stable  Last Vitals:  Vitals Value Taken Time  BP 118/62 03/03/20 1342  Temp 36.3 C 03/03/20 1342  Pulse 86 03/03/20 1345  Resp 10 03/03/20 1345  SpO2 100 % 03/03/20 1345  Vitals shown include unvalidated device data.  Last Pain:  Vitals:   03/03/20 1126  TempSrc: Temporal  PainSc: 0-No pain         Complications: No complications documented.

## 2020-03-04 ENCOUNTER — Inpatient Hospital Stay: Payer: Medicare Other

## 2020-03-04 DIAGNOSIS — R262 Difficulty in walking, not elsewhere classified: Secondary | ICD-10-CM

## 2020-03-04 LAB — CBC
HCT: 27.1 % — ABNORMAL LOW (ref 39.0–52.0)
Hemoglobin: 8.9 g/dL — ABNORMAL LOW (ref 13.0–17.0)
MCH: 32.4 pg (ref 26.0–34.0)
MCHC: 32.8 g/dL (ref 30.0–36.0)
MCV: 98.5 fL (ref 80.0–100.0)
Platelets: 123 10*3/uL — ABNORMAL LOW (ref 150–400)
RBC: 2.75 MIL/uL — ABNORMAL LOW (ref 4.22–5.81)
RDW: 22 % — ABNORMAL HIGH (ref 11.5–15.5)
WBC: 11 10*3/uL — ABNORMAL HIGH (ref 4.0–10.5)
nRBC: 0.7 % — ABNORMAL HIGH (ref 0.0–0.2)

## 2020-03-04 LAB — BASIC METABOLIC PANEL
Anion gap: 6 (ref 5–15)
BUN: 22 mg/dL (ref 8–23)
CO2: 27 mmol/L (ref 22–32)
Calcium: 8.3 mg/dL — ABNORMAL LOW (ref 8.9–10.3)
Chloride: 99 mmol/L (ref 98–111)
Creatinine, Ser: 0.92 mg/dL (ref 0.61–1.24)
GFR, Estimated: >60 mL/min (ref >60)
Glucose, Bld: 105 mg/dL — ABNORMAL HIGH (ref 70–99)
Potassium: 4.5 mmol/L (ref 3.5–5.1)
Sodium: 132 mmol/L — ABNORMAL LOW (ref 135–145)

## 2020-03-04 LAB — BRAIN NATRIURETIC PEPTIDE: B Natriuretic Peptide: 409.8 pg/mL — ABNORMAL HIGH (ref 0.0–100.0)

## 2020-03-04 NOTE — Progress Notes (Signed)
Physical Therapy Treatment Patient Details Name: Raymond Barnett MRN: [id number] DOB: [birth year] Today's Date: 03/04/2020    History of Present Illness Pt is an 86 y/o M admitted on 03/02/20 with c/o SOB, generalized weakness, Gradually progressively worsening  L hip pain beginning 3-4 weeks ago. Imaging revealed small B pleural effusions, MRI reveals acute impacted subcapital R femoral neck fx. Pt underwent R hip hemiarthroplasty ([name]) on 03/03/20. PMH: COPD, HLD, HTN, HFpEF, arthritis, B cataracts, lumbar DDD, glaucoma, h/o CTS, hx of osteomyelitis in RLE, moderate MR, PAD    PT Comments    Pt requires max encouragement & education for participation in session but declines OOB mobility, agreeable to bed level exercises. Pt performs the following RLE strengthening exercises with PT providing instructional cuing for technique: quad sets, SAQ (AAROM), hip adduction squeezes, heel slides (AAROM), hip abduction & straight leg raises (AAROM) all 1 set x 20 reps. Pt notes more pain when activating hip flexors. Pt demonstrated ability to use incentive spirometer with a max of 1000 mL. Pt on 2L/min supplemental oxygen via nasal cannula & SPO2 >90% throughout; PT educates pt not to hold breath during exercises/mobility. Pt is able to scoot to [facility] with extra time, bed in trendelenburg position, and cuing to utilize LE to assist (pt only uses LLE). Will continue to follow pt acutely to progress bed mobility, transfers & gait as pt is willing & able.      SNF;Supervision for mobility/OOB     Equipment Recommendations  Rolling walker with 5" wheels    Recommendations for Other Services       Precautions / Restrictions Precautions Precautions: Fall Precaution Comments: anterior hip (no precautions) Restrictions Weight Bearing Restrictions: Yes RLE Weight Bearing: Weight bearing as tolerated    Mobility  Bed Mobility               General bed mobility comments: Pt. up in recliner chair  upon arrival  Transfers               Ambulation/Gait                 Stairs             Wheelchair Mobility    Modified Rankin (Stroke Patients Only)       Balance                                            Cognition Arousal/Alertness: Awake/alert Behavior During Therapy: WFL for tasks assessed/performed Overall Cognitive Status: Within Functional Limits for tasks assessed                                 General Comments: pt limited by [facility] but appears AXO      Exercises      General Comments        Pertinent Vitals/Pain Pain Assessment: Faces Pain Score: 8  Faces Pain Scale: Hurts little more Pain Location: R hip Pain Descriptors / Indicators: Sore;Grimacing;Guarding Pain Intervention(s): Limited activity within patient's tolerance;Monitored during session    Home Living Family/patient expects to be discharged to:: Private residence Living Arrangements: Spouse/significant other   Type of Home: House Home Access: Level entry   Home Layout: One level Home Equipment: Cane - single point;Walker - standard;Wheelchair - power (Raised commode seat)  Prior Function Level of Independence: Independent         PT Goals (current goals can now be found in the care plan section) Acute Rehab PT Goals Patient Stated Goal: to get better PT Goal Formulation: With patient Time For Goal Achievement: 03/18/20 Potential to Achieve Goals: Good Progress towards PT goals: Progressing toward goals    Frequency    BID      PT Plan Current plan remains appropriate    Co-evaluation              AM-PAC PT "6 Clicks" Mobility   Outcome Measure  Help needed turning from your back to your side while in a flat bed without using bedrails?: A Little Help needed moving from lying on your back to sitting on the side of a flat bed without using bedrails?: A Little Help needed moving to and from a bed to a  chair (including a wheelchair)?: A Little Help needed standing up from a chair using your arms (e.g., wheelchair or bedside chair)?: A Little Help needed to walk in hospital room?: A Lot Help needed climbing 3-5 steps with a railing? : A Lot 6 Click Score: 16    End of Session Equipment Utilized During Treatment: Oxygen Activity Tolerance: Patient tolerated treatment well Patient left: in bed;with bed alarm set;with call bell/phone within reach;with SCD's reapplied   PT Visit Diagnosis: Muscle weakness (generalized) (M62.81);Difficulty in walking, not elsewhere classified (R26.2)     Time: 1610-9604 PT Time Calculation (min) (ACUTE ONLY): 23 min  Charges:  $Therapeutic Exercise: 8-22 mins $Therapeutic Activity: 8-22 mins                     Lavone Nian, PT, DPT 03/04/20, 2:42 PM    Waunita Schooner 03/04/2020, 2:40 PM

## 2020-03-04 NOTE — Anesthesia Postprocedure Evaluation (Signed)
Anesthesia Post Note  Patient: Raymond Barnett  Procedure(s) Performed: ARTHROPLASTY BIPOLAR HIP (HEMIARTHROPLASTY) (Right Hip)  Patient location during evaluation: Nursing Unit Anesthesia Type: Spinal Level of consciousness: oriented and awake and alert Pain management: pain level controlled Vital Signs Assessment: post-procedure vital signs reviewed and stable Respiratory status: spontaneous breathing, respiratory function stable and patient connected to nasal cannula oxygen Cardiovascular status: blood pressure returned to baseline and stable Postop Assessment: no headache, no backache and no apparent nausea or vomiting Anesthetic complications: no   No complications documented.   Last Vitals:  Vitals:   03/04/20 0500 03/04/20 0804  BP: 95/79 (!) 106/58  Pulse: 86 81  Resp: 16 18  Temp: 36.8 C 36.4 C  SpO2: 98% 97%    Last Pain:  Vitals:   03/04/20 0946  TempSrc:   PainSc: 5                  Arita Miss

## 2020-03-04 NOTE — Evaluation (Signed)
Occupational Therapy Evaluation Patient Details Name: Raymond Barnett MRN: [id number] DOB: 1929/11/28 Today's Date: 03/04/2020    History of Present Illness Pt is an 86 y/o Male admitted to [facility]  on 03/02/20 with c/o SOB, generalized weakness, Gradually progressively worsening  L hip pain beginning 3-4 weeks ago. Imaging revealed small B pleural effusions, MRI reveals acute impacted subcapital R femoral neck fx. Pt underwent R hip hemiarthroplasty ([name]) on 03/03/20. PMH: COPD, HLD, HTN, HFpEF, arthritis, B cataracts, lumbar DDD, glaucoma, h/o CTS, hx of osteomyelitis in RLE, moderate MR, PAD   Clinical Impression   Pt. presents with weakness, 8/10 hip pain, limited activity tolerance, and limited functional mobility which limits the ability to complete basic ADL and IADL functioning. Pt. resides at home with his wife. Pt. has supportive family. Pt. was independent with ADLs, and IADL functioning. Pt.'s wife assists with meal preparation. Pt. used a motorized w/c. Pt. Is on 2L O2 with SO2 94%, HR 81 BPMs. Pt. reports having home O2 at night. Pt. education was provided about A/E use for LE ADLs. Pt. will benefit from additional practice with A/E use for LE ADLs. Pt. Could benefit from OT services for ADL training, A/E training, and pt. Education about home modification, and DME. Pt. would benefit from SNF level of care upon discharge, with follow-up OT services. Pt. would benefit from SNF level of care upon discharge, with follow-up OT services.     Follow Up Recommendations  SNF    Equipment Recommendations       Recommendations for Other Services       Precautions / Restrictions Precautions Precautions: Fall Precaution Comments: anterior hip (no precautions) Restrictions Weight Bearing Restrictions: Yes RLE Weight Bearing: Weight bearing as tolerated      Mobility Bed Mobility Overal bed mobility: Needs Assistance Bed Mobility: Supine to Sit     Supine to sit: Min assist;HOB  elevated     General bed mobility comments: Pt. up in recliner chair upon arrival    Transfers Overall transfer level: Needs assistance Equipment used: Rolling walker (2 wheeled) Transfers: Sit to/from [facility] Sit to Stand: Min assist Stand pivot transfers: Min assist       General transfer comment: cuing for safe hand placement    Balance Overall balance assessment: Needs assistance Sitting-balance support: Feet supported Sitting balance-Leahy Scale: Good     Standing balance support: Bilateral upper extremity supported;During functional activity Standing balance-Leahy Scale: Fair                            ADL either performed or assessed with clinical judgement   ADL Overall ADL's : Needs assistance/impaired Eating/Feeding: Set up;Independent   Grooming: Set up;Moderate assistance   Upper Body Bathing: Set up;Independent   Lower Body Bathing: Set up;Maximal assistance   Upper Body Dressing : Set up;Independent   Lower Body Dressing: Set up;Maximal assistance                       Vision Baseline Vision/History: Wears glasses Patient Visual Report: No change from baseline       Perception     Praxis      Pertinent Vitals/Pain Pain Assessment: 0-10 Pain Score: 8  Faces Pain Scale: Hurts little more Pain Location: R hip Pain Descriptors / Indicators: Aching Pain Intervention(s): Limited activity within patient's tolerance;Monitored during session     Hand Dominance Right   Extremity/Trunk Assessment Upper  Extremity Assessment Upper Extremity Assessment: Generalized weakness         Communication Communication Communication: HOH   Cognition Arousal/Alertness: Awake/alert Behavior During Therapy: WFL for tasks assessed/performed Overall Cognitive Status: Within Functional Limits for tasks assessed                                 General Comments:    General Comments       Exercises      Shoulder Instructions      Home Living Family/patient expects to be discharged to:: Private residence Living Arrangements: Spouse/significant other Available Help at Discharge:  (pt reports wife cannot assist) Type of Home: House Home Access: Level entry     Home Layout: One level     Bathroom Shower/Tub: Walk-in shower;Door         Home Equipment: Kasandra Knudsen - single point;Walker - standard;Wheelchair - power (Raised commode seat)          Prior Functioning/Environment Level of Independence: Independent        Comments: Pt. was independent with ADLs. Pt's wife assists with meal preparation.        OT Problem List: Decreased strength;Decreased knowledge of use of DME or AE;Decreased activity tolerance;Decreased range of motion      OT Treatment/Interventions: Self-care/ADL training;Therapeutic exercise;Patient/family education;DME and/or AE instruction    OT Goals(Current goals can be found in the care plan section) Acute Rehab OT Goals Patient Stated Goal: To regian independence OT Goal Formulation: With patient Potential to Achieve Goals: Good  OT Frequency: Min 2X/week   Barriers to D/C:            Co-evaluation              AM-PAC OT "6 Clicks" Daily Activity     Outcome Measure Help from another person eating meals?: None Help from another person taking care of personal grooming?: A Lot Help from another person toileting, which includes using toliet, bedpan, or urinal?: A Lot Help from another person bathing (including washing, rinsing, drying)?: A Lot Help from another person to put on and taking off regular upper body clothing?: None Help from another person to put on and taking off regular lower body clothing?: A Lot 6 Click Score: 16   End of Session Equipment Utilized During Treatment: Gait belt  Activity Tolerance: Patient tolerated treatment well Patient left: in bed  OT Visit Diagnosis: Muscle weakness (generalized)  (M62.81);Unsteadiness on feet (R26.81)                Time: 0100-7121 OT Time Calculation (min): 21 min Charges:  OT General Charges $OT Visit: 1 Visit OT Evaluation $OT Eval Moderate Complexity: 1 Mod  Harrel Carina, MS, OTR/L  Harrel Carina 03/04/2020, 1:28 PM

## 2020-03-04 NOTE — Evaluation (Signed)
Physical Therapy Evaluation Patient Details Name: Raymond Barnett MRN: [id number] DOB: December 03, 1929 Today's Date: 03/04/2020   History of Present Illness  Pt is an 86 y/o M admitted on 03/02/20 with c/o SOB, generalized weakness, L hip pain beginning 3-4 weeks ago & gradually worsening. X ray reveals small B pleural effusions, MRI reveals acute impacted subcapital R femoral neck fx. Pt underwent R hip hemiarthroplasty ([name]) on 03/03/20. PMH: COPD, HLD, HTN, HFpEF, arthritis, B cataracts, lumbar DDD, glaucoma, h/o CTS, hx of osteomyelitis in RLE, moderate MR, PAD  Clinical Impression  Pt pleasant & agreeable to tx. Pt performed the following RLE exercises for strengthening with PT providing cuing for technique: ankle pumps (LLE only 2/2 R ankle fusion, no PROM), quad sets, SAQ's, heel slides & hip abduction (AAROM) all 1 set x 10 reps. Pt with increased pain with hip flexion motion. Pt requires extra time & assist for bed mobility & transfers but is able to weight bear & advance RLE to complete stand pivot to recliner with min assist & RW.   Oxygen during session: 2L/min at rest >90% Room air at rest, supine in bed: 93% Sitting EOB on room air: 75% with pt c/o slight SOB & instructed to purse lip breathe but ultimately requiring 3L/min supplemental oxygen via nasal cannula & extra time (~3 minutes) to >90% At end of session sitting in recliner on 2L/min = 97%  Will continue to follow pt acutely to increase gait distances & focus on strengthening RLE.     Follow Up Recommendations SNF;Supervision for mobility/OOB    Equipment Recommendations  Rolling walker with 5" wheels    Recommendations for Other Services       Precautions / Restrictions Precautions Precautions: Fall Precaution Comments: anterior hip (no precautions) Restrictions Weight Bearing Restrictions: Yes RLE Weight Bearing: Weight bearing as tolerated      Mobility  Bed Mobility Overal bed mobility: Needs  Assistance Bed Mobility: Supine to Sit     Supine to sit: Min assist;HOB elevated     General bed mobility comments: cuing to use bed rails & how to upright trunk    Transfers Overall transfer level: Needs assistance Equipment used: Rolling walker (2 wheeled) Transfers: Sit to/from [facility] Sit to Stand: Min assist Stand pivot transfers: Min assist (stepping to R to recliner)       General transfer comment: cuing for safe hand placement  Ambulation/Gait                Stairs            Wheelchair Mobility    Modified Rankin (Stroke Patients Only)       Balance Overall balance assessment: Needs assistance Sitting-balance support: Feet supported Sitting balance-Leahy Scale: Good     Standing balance support: Bilateral upper extremity supported;During functional activity Standing balance-Leahy Scale: Fair Standing balance comment: BUE support on RW durin gstanding/stand pivot transfers                             Pertinent Vitals/Pain Pain Assessment: Faces Faces Pain Scale: Hurts little more Pain Location: R hip with movement but pt c/o more L hip pain than R (nurse made aware) Pain Descriptors / Indicators: Grimacing;Guarding;Sore Pain Intervention(s): Monitored during session;Limited activity within patient's tolerance    Home Living Family/patient expects to be discharged to:: Private residence Living Arrangements: Spouse/significant other Available Help at Discharge:  (pt reports wife cannot assist) Type  of Home: House Home Access: Level entry     Home Layout: One level Home Equipment: Cane - single point;Walker - standard;Wheelchair - power      Prior Function           Comments: Pt reports independence without AD until 4 months ago when he began to use SPC PRN. Pt then began using power w/c ~2 months ago 2/2 pain & medical issues.     Hand Dominance        Extremity/Trunk Assessment   Upper  Extremity Assessment Upper Extremity Assessment: Overall WFL for tasks assessed    Lower Extremity Assessment Lower Extremity Assessment: Generalized weakness    Cervical / Trunk Assessment Cervical / Trunk Assessment: Kyphotic (forward trunk flexion on RW)  Communication   Communication: HOH  Cognition Arousal/Alertness: Awake/alert Behavior During Therapy: WFL for tasks assessed/performed Overall Cognitive Status: Within Functional Limits for tasks assessed                                 General Comments: pt limited by [facility] but appears AXO      General Comments      Exercises     Assessment/Plan    PT Assessment Patient needs continued PT services  PT Problem List Decreased strength;Decreased balance;Decreased knowledge of precautions;Pain;Cardiopulmonary status limiting activity;Decreased knowledge of use of DME;Decreased mobility;Decreased range of motion;Decreased activity tolerance;Decreased safety awareness;Decreased coordination;Decreased skin integrity       PT Treatment Interventions DME instruction;Functional mobility training;Balance training;Patient/family education;Modalities;Neuromuscular re-education;Therapeutic activities;Gait training;Stair training;Therapeutic exercise;Manual techniques    PT Goals (Current goals can be found in the Care Plan section)  Acute Rehab PT Goals Patient Stated Goal: to feel better PT Goal Formulation: With patient Time For Goal Achievement: 03/18/20 Potential to Achieve Goals: Good    Frequency BID   Barriers to discharge Decreased caregiver support wife unable to provide physical assist at d/c    Co-evaluation               AM-PAC PT "6 Clicks" Mobility  Outcome Measure Help needed turning from your back to your side while in a flat bed without using bedrails?: A Little Help needed moving from lying on your back to sitting on the side of a flat bed without using bedrails?: A Little Help needed  moving to and from a bed to a chair (including a wheelchair)?: A Little Help needed standing up from a chair using your arms (e.g., wheelchair or bedside chair)?: A Little Help needed to walk in hospital room?: A Lot Help needed climbing 3-5 steps with a railing? : A Lot 6 Click Score: 16    End of Session Equipment Utilized During Treatment: Gait belt;Oxygen Activity Tolerance: Patient tolerated treatment well Patient left: in chair;with call bell/phone within reach;with chair alarm set;with SCD's reapplied Nurse Communication: Mobility status (oxygen) PT Visit Diagnosis: Muscle weakness (generalized) (M62.81);Difficulty in walking, not elsewhere classified (R26.2)    Time: 6256-3893 PT Time Calculation (min) (ACUTE ONLY): 41 min   Charges:   PT Evaluation $PT Eval Low Complexity: 1 Low PT Treatments $Therapeutic Exercise: 8-22 mins $Therapeutic Activity: 8-22 mins        Raymond Barnett, PT, DPT 03/04/20, 10:05 AM   Raymond Barnett 03/04/2020, 10:02 AM

## 2020-03-04 NOTE — Progress Notes (Signed)
PROGRESS NOTE    Raymond Barnett  JME:[id number] DOB: [birth year] DOA: 03/02/2020 PCP: [name], DO    Brief Narrative:  Raymond Barnett is a 86 y.o. male with medical history significant for COPD, chronic diastolic heart failure, chronic anemia, HLD, HTN, chronic osteoarthritis and low back pain, BPH and glaucoma, who at baseline has limited ambulation, using motorized wheelchair prescribed by his provider for the past 2 months was brought into the emergency room at the recommendation of his PCP due to a complaint of persistent and left hip pain which has acutely worsened his ability to ambulate.left hip x-ray but then went on to have CT pelvis followed by MRI pelvis that showed right impacted subcapital femoral neck fracture.   111/12- went to OR today.Cardiology saw pt for cardiac clearance 11/13- PT recommends SNF.     Consultants:   Cardiology, orthopedics  Procedures:   Antimicrobials:    cefazolin x3 doses   Subjective: Pt without any sob, cp. No complaints of pain Objective: Vitals:   03/03/20 2100 03/03/20 2327 03/04/20 0500 03/04/20 0639  BP: (!) 97/59 107/64 95/79   Pulse: 94 89 86   Resp: 17 16 16    Temp: 99 F (37.2 C) 98.2 F (36.8 C) 98.3 F (36.8 C)   TempSrc: Oral Oral Oral   SpO2: 97% 96% 98%   Weight:    101.2 kg  Height:        Intake/Output Summary (Last 24 hours) at 03/04/2020 0752 Last data filed at 03/04/2020 0500 Gross per 24 hour  Intake 1455 ml  Output 1350 ml  Net 105 ml   Filed Weights   03/02/20 1217 03/03/20 0122 03/04/20 0639  Weight: 90.7 kg 91.7 kg 101.2 kg    Examination: Calm, comfortable CTA no wheeze rales rhonchi's Regular S1-S2 no murmurs Soft benign positive bowel sounds No edema Alert oriented x3 Mood and affect appropriate in current setting    Data Reviewed: I have personally reviewed following labs and imaging studies  CBC: Recent Labs  Lab 03/02/20 1228 03/03/20 0025 03/04/20 0400    WBC 9.9 10.4 11.0*  HGB 11.6* 10.9* 8.9*  HCT 35.5* 33.1* 27.1*  MCV 97.8 97.9 98.5  PLT 158 123* 229*   Basic Metabolic Panel: Recent Labs  Lab 03/02/20 1228 03/03/20 0025 03/04/20 0400  NA 132* 130* 132*  K 4.4 4.2 4.5  CL 96* 96* 99  CO2 25 24 27   GLUCOSE 104* 129* 105*  BUN 19 20 22   CREATININE 0.84 0.78 0.92  CALCIUM 8.8* 8.5* 8.3*   GFR: Estimated Creatinine Clearance: 66.2 mL/min (by C-G formula based on SCr of 0.92 mg/dL). Liver Function Tests: No results for input(s): AST, ALT, ALKPHOS, BILITOT, PROT, ALBUMIN in the last 168 hours. No results for input(s): LIPASE, AMYLASE in the last 168 hours. No results for input(s): AMMONIA in the last 168 hours. Coagulation Profile: No results for input(s): INR, PROTIME in the last 168 hours. Cardiac Enzymes: No results for input(s): CKTOTAL, CKMB, CKMBINDEX, TROPONINI in the last 168 hours. BNP (last 3 results) No results for input(s): PROBNP in the last 8760 hours. HbA1C: No results for input(s): HGBA1C in the last 72 hours. CBG: No results for input(s): GLUCAP in the last 168 hours. Lipid Profile: No results for input(s): CHOL, HDL, LDLCALC, TRIG, CHOLHDL, LDLDIRECT in the last 72 hours. Thyroid Function Tests: Recent Labs    03/03/20 0020  TSH 4.324   Anemia Panel: No results for input(s): VITAMINB12, FOLATE, FERRITIN, TIBC,  IRON, RETICCTPCT in the last 72 hours. Sepsis Labs: No results for input(s): PROCALCITON, LATICACIDVEN in the last 168 hours.  Recent Results (from the past 240 hour(s))  Respiratory Panel by RT PCR (Flu A&B, Covid) - Nasopharyngeal Swab     Status: None   Collection Time: 03/03/20 12:23 AM   Specimen: Nasopharyngeal Swab  Result Value Ref Range Status   SARS Coronavirus 2 by RT PCR NEGATIVE NEGATIVE Final    Comment: (NOTE) SARS-CoV-2 target nucleic acids are NOT DETECTED.  The SARS-CoV-2 RNA is generally detectable in upper respiratoy specimens during the acute phase of infection.  The lowest concentration of SARS-CoV-2 viral copies this assay can detect is 131 copies/mL. A negative result does not preclude SARS-Cov-2 infection and should not be used as the sole basis for treatment or other patient management decisions. A negative result may occur with  improper specimen collection/handling, submission of specimen other than nasopharyngeal swab, presence of viral mutation(s) within the areas targeted by this assay, and inadequate number of viral copies (<131 copies/mL). A negative result must be combined with clinical observations, patient history, and epidemiological information. The expected result is Negative.  Fact Sheet for Patients:  PinkCheek.be  Fact Sheet for Healthcare Providers:  GravelBags.it  This test is no t yet approved or cleared by the [place] FDA and  has been authorized for detection and/or diagnosis of SARS-CoV-2 by FDA under an Emergency Use Authorization (EUA). This EUA will remain  in effect (meaning this test can be used) for the duration of the COVID-19 declaration under Section 564(b)(1) of the Act, 21 U.S.C. section 360bbb-3(b)(1), unless the authorization is terminated or revoked sooner.     Influenza A by PCR NEGATIVE NEGATIVE Final   Influenza B by PCR NEGATIVE NEGATIVE Final    Comment: (NOTE) The Xpert Xpress SARS-CoV-2/FLU/RSV assay is intended as an aid in  the diagnosis of influenza from Nasopharyngeal swab specimens and  should not be used as a sole basis for treatment. Nasal washings and  aspirates are unacceptable for Xpert Xpress SARS-CoV-2/FLU/RSV  testing.  Fact Sheet for Patients: PinkCheek.be  Fact Sheet for Healthcare Providers: GravelBags.it  This test is not yet approved or cleared by the [place] FDA and  has been authorized for detection and/or diagnosis of SARS-CoV-2 by  FDA  under an Emergency Use Authorization (EUA). This EUA will remain  in effect (meaning this test can be used) for the duration of the  Covid-19 declaration under Section 564(b)(1) of the Act, 21  U.S.C. section 360bbb-3(b)(1), unless the authorization is  terminated or revoked. Performed at [facility], [address], [place], [place] [address]   Surgical PCR screen     Status: None   Collection Time: 03/03/20  1:55 AM   Specimen: Nasal Mucosa; Nasal Swab  Result Value Ref Range Status   MRSA, PCR NEGATIVE NEGATIVE Final   Staphylococcus aureus NEGATIVE NEGATIVE Final    Comment: (NOTE) The Xpert SA Assay (FDA approved for NASAL specimens in patients 38 years of age and older), is one component of a comprehensive surveillance program. It is not intended to diagnose infection nor to guide or monitor treatment. Performed at [facility], [address] [place], [place] [address]          Radiology Studies: DG Chest 2 View  Result Date: 03/02/2020 CLINICAL DATA:  Shortness of breath EXAM: CHEST - 2 VIEW COMPARISON:  October 12, 2019 FINDINGS: There is a small pleural effusion on each  side with mild bibasilar atelectasis. The lungs elsewhere are clear. There is cardiomegaly with pulmonary vascularity normal. No adenopathy. There is aortic atherosclerosis. There is leftward deviation of the upper thoracic trachea. Bones appear osteoporotic. IMPRESSION: 1. Small pleural effusions bilaterally with bibasilar atelectasis. Lungs elsewhere clear. 2.  Cardiomegaly with pulmonary vascularity normal. 3. Deviation of the upper thoracic trachea toward the left. Question thyroid enlargement as etiology for this finding. 4.  Aortic Atherosclerosis (ICD10-I70.0). Electronically Signed   By: Lowella Grip III M.D.   On: 03/02/2020 13:03   CT PELVIS WO CONTRAST  Result Date: 03/02/2020 CLINICAL DATA:  Hip pain for 10 days EXAM: CT PELVIS WITHOUT CONTRAST TECHNIQUE: Multidetector  CT imaging of the pelvis was performed following the standard protocol without intravenous contrast. COMPARISON:  Radiograph 03/02/2020 FINDINGS: Urinary Tract:  Urinary bladder is unremarkable. Bowel: Sigmoid colon diverticula without acute inflammatory process. Negative appendix. No bowel wall thickening Vascular/Lymphatic: Extensive aortic atherosclerosis without aneurysm. No suspicious nodes Reproductive:  Mild prostate calcification without enlargement Other:  Negative for pelvic effusion Musculoskeletal: Mild degenerative changes of both hips with joint space narrowing and asymmetric femoral head neck junction. Chronic appearing deformity at the left iliac crest potentially due to remote trauma or postsurgical change. There is no fracture or associated soft tissue mass. There is fatty atrophy of adjacent gluteus musculature. No femoral head dislocation. Coronal images are suspicious for acute appearing nondisplaced right femoral neck fracture. Pubic symphysis and rami appear intact. IMPRESSION: 1. No definite acute osseous abnormality of left hip. Chronic appearing osseous deformity of left iliac crest without evidence for bony destructive change or associated soft tissue mass. 2. Findings are suspicious for an acute nondisplaced right femoral neck fracture 3. Sigmoid colon diverticular disease without acute inflammatory change. Aortic Atherosclerosis (ICD10-I70.0). Electronically Signed   By: Donavan Foil M.D.   On: 03/02/2020 16:33   MR PELVIS WO CONTRAST  Result Date: 03/02/2020 CLINICAL DATA:  Right hip pain EXAM: MRI PELVIS WITHOUT CONTRAST TECHNIQUE: Multiplanar multisequence MR imaging of the pelvis was performed. No intravenous contrast was administered. COMPARISON:  CT 3:54 p.m., plain radiographs 12:36 p.m. FINDINGS: Urinary Tract: The distal ureters are decompressed. The bladder is unremarkable. Bowel: The visualized large and small bowel are unremarkable per save for severe sigmoid  diverticulosis. Vascular/Lymphatic: No pathologic adenopathy within the abdomen and pelvis Reproductive:  No mass or other significant abnormality Other:  None significant Musculoskeletal: There is an acute, impacted subcapital right femoral neck fracture with mild varus angulation of the distal fracture fragment. The femoral head is still seated within the right acetabulum. Iliofemoral in pubic femoral ligaments are intact. Ligamentum teres is intact. There is mild bilateral degenerative hip arthritis. There is injury signal within the right gluteal musculature compatible with mild strain. Similar changes are noted within the left gluteus maximus muscle. There is increased signal compatible with greater trochanteric bursitis on the right. There is increased signal within the visualized proximal vastus lateralis and intermedius compatible with muscle strain. No frank avulsion. There is irregularity of the left iliac crest without associated marrow edema or soft tissue mass most in keeping with mod trauma or surgical intervention. IMPRESSION: Acute, impacted, mildly angulated right subcapital femoral neck fracture Increased signal in keeping with muscular strain involving the a right gluteal musculature as well as the vastus lateralis and intermedius. No frank tendinous avulsion. Mild superimposed bilateral hip degenerative arthritis. Electronically Signed   By: Fidela Salisbury MD   On: 03/02/2020 22:36   DG HIP OPERATIVE  UNILAT W OR W/O PELVIS RIGHT  Result Date: 03/03/2020 CLINICAL DATA:  Total hip replacement EXAM: OPERATIVE RIGHT HIP   1 VIEW TECHNIQUE: Fluoroscopic spot image(s) were submitted for interpretation post-operatively. COMPARISON:  Pelvis radiograph March 02, 2020; CT and MR pelvis March 02, 2020 FLUOROSCOPY TIME:  0 minutes 6 seconds; 2 acquired images FINDINGS: Initial image demonstrates subcapital femoral neck fracture on the right. Subsequent image shows a total hip replacement right  with visualized prosthetic components well-seated on frontal view. No fracture or dislocation evident post prosthetic placement. IMPRESSION: Total hip replacement on the right with visualized prosthetic components well-seated on frontal view. No fracture or dislocation evident on image demonstrating prosthesis placement. Electronically Signed   By: Lowella Grip III M.D.   On: 03/03/2020 13:53   DG Hip Unilat With Pelvis 2-3 Views Left  Result Date: 03/02/2020 CLINICAL DATA:  Pain EXAM: DG HIP (WITH OR WITHOUT PELVIS) 2-3V LEFT COMPARISON:  None. FINDINGS: Frontal pelvis as well as frontal and lateral left hip images were obtained. No fracture or dislocation. There is mild symmetric narrowing of each hip joint. There is moderate osteoarthritic change in the right sacroiliac joint. There is also degenerative change in the lower lumbar spine. No erosions. There is bony overgrowth along the left lateral iliac crest. IMPRESSION: Mild symmetric narrowing each hip joint. 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 Bony overgrowth along the lateral left iliac crest measuring 8.8 x 3.3 cm. Question prior trauma in this area. Neoplastic etiology for this somewhat unusual appearance must be questioned. Nonemergent CT of the pelvis to further evaluate this region is felt to be warranted. No acute fracture or dislocation. Electronically Signed   By: Lowella Grip III M.D.   On: 03/02/2020 13:07   DG HIP UNILAT W OR W/O PELVIS 2-3 VIEWS RIGHT  Result Date: 03/03/2020 CLINICAL DATA:  Post RIGHT total hip arthroplasty EXAM: DG HIP (WITH OR WITHOUT PELVIS) 2-3V RIGHT COMPARISON:  03/02/2020 FINDINGS: RIGHT hip prosthesis newly identified. No acute fracture or dislocation. Bones demineralized. Overlying skin clips and postsurgical changes of soft tissues. IMPRESSION: RIGHT hip prosthesis without acute complication. Electronically Signed   By: Lavonia Dana M.D.   On: 03/03/2020 14:13         Scheduled Meds: . aspirin  81 mg Oral Daily  . Chlorhexidine Gluconate Cloth  6 each Topical Daily  . cholecalciferol  1,000 Units Oral Daily  . docusate sodium  100 mg Oral BID  . enoxaparin (LOVENOX) injection  40 mg Subcutaneous Q24H  . lisinopril  5 mg Oral Daily  . [place] Calcium/D  1 tablet Oral Daily  . pantoprazole  40 mg Oral Daily  . timolol  1 drop Both Eyes BID  . traMADol  50 mg Oral Q6H   Continuous Infusions: . sodium chloride Stopped (03/03/20 1752)  . methocarbamol (ROBAXIN) IV      Assessment & Plan:   Principal Problem:   Fracture of femoral neck, right, closed ([place]) Active Problems:   Essential hypertension   Chronic bilateral low back pain without sciatica   Benign prostatic hyperplasia with urinary frequency   PAD (peripheral artery disease) (HCC)   Chronic diastolic CHF (congestive heart failure) ([place]), EF 55-60% 10/2019   Ambulatory dysfunction   COPD with chronic bronchitis (HCC)   Glaucoma   Chronic anemia   Tracheal deviation   86 year old male with history of COPD, chronic diastolic heart failure, chronic anemia, HLD, HTN, chronic osteoarthritis and low back pain, BPH  and glaucoma, who at baseline has limited ambulation, presenting with shortness of breath and 2-week history of left hip pain with no reported history of a fall as well as dyspnea on exertion to where it limits his ability to participate in his ADLs .   Fracture of femoral neck, right, closed ([place])   Chronic bilateral low back pain without sciatica and osteoarthritis   Ambulatory dysfunction, acute on chronic -MRI as requested by [name] showing right femoral neck fracture. 11/13-status post right arthroplasty by [name] on 11/12  Per ortho MRI did not reveal any structural issues with Lt hip.  Weight bearing as tolerated to Rt leg. PT recommends SNF  Pain control  Bowel regimen  dvt ppx     Shortness of breath -Patient having increased shortness of  breath for the past couple weeks of uncertain etiology -Chest x-ray showing left tracheal deviation possibly due to thyroid enlargement, small pleural effusions bilaterally, cardiomegaly with normal pulmonary vascularity 11/12-cardiology input was appreciated, patient was at acceptable risk for surgery today. No further cardiac work-up is necessary.  Several months ago echo revealed normal ejection fraction 11/13-clinically has improved. D/c ivf Use lasix prn as needed clinically Echo also with mild to moderate elevated RH pressures, likely improved with lasix.  Daily weight,i/os  Left tracheal deviation on chest x-ray -Chest x-ray showing left tracheal deviation possibly due to enlarged thyroid -Patient endorses hoarseness and difficulty swallowing for the past month in addition to shortness of breath 11/13- obtain thyroid US, pending. May need ENT evaluation as outpt.   Possible mild exacerbation of acute on chronic diastolic CHF (congestive heart failure) ([place]),  -Patient with a shortness of breath, mild lower extremity edema  11/13 -clinically has improved, asymptomatic  IV fluids discontinued  Cardiology following  Check daily weight and I's and O's       COPD with chronic bronchitis ([place]) -Without acute exacerbation  Continue nebs/inhalers       Essential hypertension Controlled but on low side.  Hold lisinopril for now     Benign prostatic hyperplasia with urinary frequency -Continue Proscar    PAD (peripheral artery disease) (HCC) -Continue aspirin    Glaucoma -Continue timolol drops    Chronic anemia -Hemoglobin at baseline.  Patient follows with Dr. Rogue Bussing  DVT prophylaxis: lovenox Code Status:DNR Family Communication: Spoke to daughter.  They are interested in [facility] Status is: Inpatient  Remains inpatient appropriate because: Unsafe discharge  Dispo: The patient is from: Home              Anticipated d/c is to: SNF               Anticipated d/c date is: 2 days              Patient currently is not medically stable to d/c. needs to be cleared by orthopedic surgery, SNF pending. Contacted case management about [facility] as daughter is interested in this.          LOS: 2 days   Time spent: 35 min with >50% on coc   Nolberto Hanlon, MD [facility] Pager [phone]  If 7PM-7AM, please contact night-coverage [email or web address] Password [facility] 03/04/2020, 7:52 AM

## 2020-03-04 NOTE — NC FL2 (Signed)
[place] LEVEL OF CARE SCREENING TOOL     IDENTIFICATION  Patient Name: Raymond Barnett Birthdate: 1929/08/23 Sex: male Admission Date (Current Location): 03/02/2020  [place] and [place] Number:  Engineering geologist and Address:  [facility], [address] [place], [place] [address]      Provider Number:    Attending Physician Name and Address:  Nolberto Hanlon, MD  Relative Name and Phone Number:  Sunil, Hue [phone]    Current Level of Care: Hospital Recommended Level of Care: [place] Prior Approval Number:    Date Approved/Denied:   PASRR Number: [id number]  Discharge Plan: SNF    Current Diagnoses: Patient Active Problem List   Diagnosis Date Noted  . Fracture of femoral neck, right, closed ([place]) 03/02/2020  . Ambulatory dysfunction 03/02/2020  . COPD with chronic bronchitis ([place]) 03/02/2020  . Glaucoma 03/02/2020  . Chronic anemia 03/02/2020  . Tracheal deviation 03/02/2020  . Primary osteoarthritis of both knees 12/29/2019  . Status post total right knee replacement 12/29/2019  . Macrocytic anemia 12/21/2019  . Critical lower limb ischemia ([place]) 04/03/2018  . Chronic diastolic CHF (congestive heart failure) ([place]), EF 55-60% 10/2019 04/03/2018  . Allergic rhinitis due to allergen 12/17/2017  . Ankle wound, right, sequela 09/17/2017  . Dyspnea on exertion 07/31/2017  . Coronary artery calcification 07/31/2017  . Claudication ([place]) 07/31/2017  . PAD (peripheral artery disease) ([place]) 07/31/2017  . Hyperlipidemia LDL goal <70 07/31/2017  . Aortic atherosclerosis ([place]) 07/31/2017  . Hemorrhoids 06/18/2017  . Paraseptal emphysema ([place]) 06/18/2017  . Benign prostatic hyperplasia with urinary frequency 05/06/2017  . Lung density on x-ray 09/03/2016  . DDD (degenerative disc disease), lumbar 09/02/2016  . Elevated blood sugar 03/05/2016  . Peripheral neuropathy 01/23/2016  . Chronic  bilateral low back pain without sciatica 05/18/2015  . Essential hypertension 02/13/2015  . Chronic fatigue 02/13/2015  . Seasonal allergies 02/13/2015    Orientation RESPIRATION BLADDER Height & Weight     Self, Place  O2 Continent Weight: 223 lb 1.7 oz (101.2 kg) Height:  5\' 10"  (177.8 cm)  BEHAVIORAL SYMPTOMS/MOOD NEUROLOGICAL BOWEL NUTRITION STATUS      Continent    AMBULATORY STATUS COMMUNICATION OF NEEDS Skin   Extensive Assist Verbally Surgical wounds                       Personal Care Assistance Level of Assistance  Bathing, Feeding, Dressing, Total care Bathing Assistance: Limited assistance Feeding assistance: Independent Dressing Assistance: Limited assistance Total Care Assistance: Maximum assistance   Functional Limitations Info             SPECIAL CARE FACTORS FREQUENCY  PT (By licensed PT), OT (By licensed OT)     PT Frequency: 5 X weekly OT Frequency: 5 X weekly            Contractures      Additional Factors Info  Code Status Code Status Info: DNR             Current Medications (03/04/2020):  This is the current hospital active medication list Current Facility-Administered Medications  Medication Dose Route Frequency Provider Last Rate Last Admin  . acetaminophen (TYLENOL) tablet 325-650 mg  325-650 mg Oral Q6H PRN Hessie Knows, MD      . alum & mag hydroxide-simeth (MAALOX/MYLANTA) 200-200-20 MG/5ML suspension 30 mL  30 mL Oral Q4H PRN Hessie Knows, MD      . aspirin chewable tablet  81 mg  81 mg Oral Daily Hessie Knows, MD      . bisacodyl (DULCOLAX) suppository 10 mg  10 mg Rectal Daily PRN Hessie Knows, MD      . Chlorhexidine Gluconate Cloth 2 % PADS 6 each  6 each Topical Daily Nolberto Hanlon, MD   6 each at 03/04/20 0953  . cholecalciferol (VITAMIN D) tablet 1,000 Units  1,000 Units Oral Daily Hessie Knows, MD      . diphenhydrAMINE (BENADRYL) 12.5 MG/5ML elixir 12.5-25 mg  12.5-25 mg Oral Q4H PRN Hessie Knows, MD      .  docusate sodium (COLACE) capsule 100 mg  100 mg Oral BID Hessie Knows, MD   100 mg at 03/04/20 0950  . enoxaparin (LOVENOX) injection 40 mg  40 mg Subcutaneous Q24H Hessie Knows, MD   40 mg at 03/04/20 0947  . fluticasone (FLONASE) 50 MCG/ACT nasal spray 2 spray  2 spray Each Nare Daily PRN Hessie Knows, MD      . HYDROcodone-acetaminophen [facility]) 7.5-325 MG per tablet 1-2 tablet  1-2 tablet Oral Q4H PRN Hessie Knows, MD   1 tablet at 03/04/20 1218  . HYDROcodone-acetaminophen (NORCO/VICODIN) 5-325 MG per tablet 1-2 tablet  1-2 tablet Oral Q4H PRN Hessie Knows, MD      . magnesium citrate solution 1 Bottle  1 Bottle Oral Once PRN Hessie Knows, MD      . magnesium hydroxide (MILK OF MAGNESIA) suspension 30 mL  30 mL Oral Daily PRN Hessie Knows, MD      . menthol-cetylpyridinium (CEPACOL) lozenge 3 mg  1 lozenge Oral PRN Hessie Knows, MD       Or  . phenol (CHLORASEPTIC) mouth spray 1 spray  1 spray Mouth/Throat PRN Hessie Knows, MD      . methocarbamol (ROBAXIN) tablet 500 mg  500 mg Oral Q6H PRN Hessie Knows, MD   500 mg at 03/04/20 0039   Or  . methocarbamol (ROBAXIN) 500 mg in dextrose 5 % 50 mL IVPB  500 mg Intravenous Q6H PRN Hessie Knows, MD      . metoCLOPramide (REGLAN) tablet 5-10 mg  5-10 mg Oral Q8H PRN Hessie Knows, MD       Or  . metoCLOPramide (REGLAN) injection 5-10 mg  5-10 mg Intravenous Q8H PRN Hessie Knows, MD      . morphine 2 MG/ML injection 0.5-1 mg  0.5-1 mg Intravenous Q2H PRN Hessie Knows, MD   1 mg at 03/04/20 0039  . ondansetron (ZOFRAN) tablet 4 mg  4 mg Oral Q6H PRN Hessie Knows, MD       Or  . ondansetron [facility]) injection 4 mg  4 mg Intravenous Q6H PRN Hessie Knows, MD      . [place] Calcium/D 500-5 MG-MCG TABS 1 tablet  1 tablet Oral Daily Hessie Knows, MD      . pantoprazole (PROTONIX) EC tablet 40 mg  40 mg Oral Daily Hessie Knows, MD      . polyvinyl alcohol (LIQUIFILM TEARS) 1.4 % ophthalmic solution 1 drop  1 drop Both Eyes PRN Nolberto Hanlon, MD      . timolol (TIMOPTIC) 0.5 % ophthalmic solution 1 drop  1 drop Both Eyes BID Hessie Knows, MD   1 drop at 03/04/20 0953  . traMADol (ULTRAM) tablet 50 mg  50 mg Oral Q6H Hessie Knows, MD   50 mg at 03/03/20 2308  . zolpidem (AMBIEN) tablet 5 mg  5 mg Oral QHS PRN Hessie Knows, MD  Discharge Medications: Please see discharge summary for a list of discharge medications.  Relevant Imaging Results:  Relevant Lab Results:   Additional Information [id number]  Meriel Flavors, LCSW

## 2020-03-04 NOTE — Progress Notes (Addendum)
  Subjective: 1 Day Post-Op Procedure(s) (LRB): ARTHROPLASTY BIPOLAR HIP (HEMIARTHROPLASTY) (Right) Patient reports pain as minimal on the right side, but does continue to complain of left-sided hip pain Patient is otherwise doing well. Daughter at bedside.  Plan is to go Skilled nursing facility after hospital stay. Negative for chest pain Fever: no Gastrointestinal: negative for nausea and vomiting.   Patient has not had a bowel movement.  Objective: Vital signs in last 24 hours: Temp:  [97.4 F (36.3 C)-99.2 F (37.3 C)] 97.6 F (36.4 C) (11/13 0804) Pulse Rate:  [75-95] 76 (11/13 1110) Resp:  [11-18] 18 (11/13 1110) BP: (95-135)/(51-79) 108/60 (11/13 1110) SpO2:  [96 %-100 %] 99 % (11/13 1110) FiO2 (%):  [21 %] 21 % (11/12 1535) Weight:  [101.2 kg] 101.2 kg (11/13 0639)  Intake/Output from previous day:  Intake/Output Summary (Last 24 hours) at 03/04/2020 1147 Last data filed at 03/04/2020 1025 Gross per 24 hour  Intake 1575 ml  Output 1150 ml  Net 425 ml    Intake/Output this shift: Total I/O In: 120 [P.O.:120] Out: -   Labs: Recent Labs    03/02/20 1228 03/03/20 0025 03/04/20 0400  HGB 11.6* 10.9* 8.9*   Recent Labs    03/03/20 0025 03/04/20 0400  WBC 10.4 11.0*  RBC 3.38* 2.75*  HCT 33.1* 27.1*  PLT 123* 123*   Recent Labs    03/03/20 0025 03/04/20 0400  NA 130* 132*  K 4.2 4.5  CL 96* 99  CO2 24 27  BUN 20 22  CREATININE 0.78 0.92  GLUCOSE 129* 105*  CALCIUM 8.5* 8.3*   No results for input(s): LABPT, INR in the last 72 hours.   EXAM General - Patient is Alert, Appropriate and Oriented Extremity - unable to dorsi/plantarflex ankle due to history of ankle fusion, sensation absent to light touch over the superficial and deep fibular nerve distributions, but intact over the saphenous and lateral sural cutaneous Dressing/Incision -Prevena in place, with some loosening of the seal noted inferolaterally Motor Function -unable to move toes,  this is pre-existing deficit Gastrointestinal- soft, nontender and active bowel sounds   Assessment/Plan: 1 Day Post-Op Procedure(s) (LRB): ARTHROPLASTY BIPOLAR HIP (HEMIARTHROPLASTY) (Right) Principal Problem:   Fracture of femoral neck, right, closed ([place]) Active Problems:   Essential hypertension   Chronic bilateral low back pain without sciatica   Benign prostatic hyperplasia with urinary frequency   PAD (peripheral artery disease) (HCC)   Chronic diastolic CHF (congestive heart failure) ([place]), EF 55-60% 10/2019   Ambulatory dysfunction   COPD with chronic bronchitis (HCC)   Glaucoma   Chronic anemia   Tracheal deviation  Estimated body mass index is 32.01 kg/m as calculated from the following:   Height as of this encounter: 5\' 10"  (1.778 m).   Weight as of this encounter: 101.2 kg. Advance diet Up with therapy Patient will require SNF at discharge  Dressing seal reinforced. MRI did not reveal any structural issues with the left hip, so it is likely related to the patient's back. This will have to be evaluated later in an outpatient setting.   DVT Prophylaxis - Lovenox, Ted hose and SCDs Weight-Bearing as tolerated to right leg  Cassell Smiles, PA-C [facility] Orthopaedic Surgery 03/04/2020, 11:47 AM

## 2020-03-05 LAB — CBC
HCT: 26.6 % — ABNORMAL LOW (ref 39.0–52.0)
Hemoglobin: 8.8 g/dL — ABNORMAL LOW (ref 13.0–17.0)
MCH: 32.1 pg (ref 26.0–34.0)
MCHC: 33.1 g/dL (ref 30.0–36.0)
MCV: 97.1 fL (ref 80.0–100.0)
Platelets: 119 10*3/uL — ABNORMAL LOW (ref 150–400)
RBC: 2.74 MIL/uL — ABNORMAL LOW (ref 4.22–5.81)
RDW: 22 % — ABNORMAL HIGH (ref 11.5–15.5)
WBC: 8.8 10*3/uL (ref 4.0–10.5)
nRBC: 1.6 % — ABNORMAL HIGH (ref 0.0–0.2)

## 2020-03-05 NOTE — TOC Progression Note (Signed)
Transition of Care [facility]) - Progression Note    Patient Details  Name: Raymond Barnett MRN: [id number] Date of Birth: June 18, 1929  [facility]) CM/SW Contact  Jayne Peckenpaugh, Nonda Lou, [place] Phone Number: 03/05/2020, 11:07 AM  Clinical Narrative:  [facility] Team update. Attending shares patient is ready for discharge. Bed offers are still pending at present. Will continue to monitor.          Expected Discharge Plan and Services                                                 Social Determinants of Health (SDOH) Interventions    Readmission Risk Interventions No flowsheet data found.

## 2020-03-05 NOTE — Progress Notes (Addendum)
PROGRESS NOTE    Raymond Barnett  TMH:[id number] DOB: [birth year] DOA: 03/02/2020 PCP: [name], DO    Brief Narrative:  Raymond Barnett is a 86 y.o. male with medical history significant for COPD, chronic diastolic heart failure, chronic anemia, HLD, HTN, chronic osteoarthritis and low back pain, BPH and glaucoma, who at baseline has limited ambulation, using motorized wheelchair prescribed by his provider for the past 2 months was brought into the emergency room at the recommendation of his PCP due to a complaint of persistent and left hip pain which has acutely worsened his ability to ambulate.left hip x-ray but then went on to have CT pelvis followed by MRI pelvis that showed right impacted subcapital femoral neck fracture.   111/12- went to OR today.Cardiology saw pt for cardiac clearance 11/13- PT recommends SNF 11/14-no over night issues. SNF placement pending, d/w case mx    Consultants:   Cardiology, orthopedics  Procedures:   Antimicrobials:    cefazolin x3 doses   Subjective: Patient denies chest pain, shortness of breath, dizziness or any other symptoms Minimal pain  Objective: Vitals:   03/04/20 2019 03/04/20 2354 03/05/20 0401 03/05/20 0743  BP: 102/61 106/67 109/61 118/63  Pulse: 92 79 82 84  Resp: 16 19 17 16   Temp: 98.5 F (36.9 C) 98.1 F (36.7 C) 97.6 F (36.4 C) 97.9 F (36.6 C)  TempSrc: Oral Oral Oral Oral  SpO2: 98% 98% 97% 100%  Weight:   100.9 kg   Height:        Intake/Output Summary (Last 24 hours) at 03/05/2020 0841 Last data filed at 03/05/2020 0554 Gross per 24 hour  Intake 240 ml  Output 500 ml  Net -260 ml   Filed Weights   03/03/20 0122 03/04/20 0639 03/05/20 0401  Weight: 91.7 kg 101.2 kg 100.9 kg    Examination: In chair, comfortable, calm cta no w/r/r Regular s1/s2 Soft benign +bs No edema aaxox3 Mood and affect appropriate in current setting     Data Reviewed: I have personally reviewed  following labs and imaging studies  CBC: Recent Labs  Lab 03/02/20 1228 03/03/20 0025 03/04/20 0400 03/05/20 0543  WBC 9.9 10.4 11.0* 8.8  HGB 11.6* 10.9* 8.9* 8.8*  HCT 35.5* 33.1* 27.1* 26.6*  MCV 97.8 97.9 98.5 97.1  PLT 158 123* 123* 921*   Basic Metabolic Panel: Recent Labs  Lab 03/02/20 1228 03/03/20 0025 03/04/20 0400  NA 132* 130* 132*  K 4.4 4.2 4.5  CL 96* 96* 99  CO2 25 24 27   GLUCOSE 104* 129* 105*  BUN 19 20 22   CREATININE 0.84 0.78 0.92  CALCIUM 8.8* 8.5* 8.3*   GFR: Estimated Creatinine Clearance: 66.1 mL/min (by C-G formula based on SCr of 0.92 mg/dL). Liver Function Tests: No results for input(s): AST, ALT, ALKPHOS, BILITOT, PROT, ALBUMIN in the last 168 hours. No results for input(s): LIPASE, AMYLASE in the last 168 hours. No results for input(s): AMMONIA in the last 168 hours. Coagulation Profile: No results for input(s): INR, PROTIME in the last 168 hours. Cardiac Enzymes: No results for input(s): CKTOTAL, CKMB, CKMBINDEX, TROPONINI in the last 168 hours. BNP (last 3 results) No results for input(s): PROBNP in the last 8760 hours. HbA1C: No results for input(s): HGBA1C in the last 72 hours. CBG: No results for input(s): GLUCAP in the last 168 hours. Lipid Profile: No results for input(s): CHOL, HDL, LDLCALC, TRIG, CHOLHDL, LDLDIRECT in the last 72 hours. Thyroid Function Tests: Recent Labs  03/03/20 0020  TSH 4.324   Anemia Panel: No results for input(s): VITAMINB12, FOLATE, FERRITIN, TIBC, IRON, RETICCTPCT in the last 72 hours. Sepsis Labs: No results for input(s): PROCALCITON, LATICACIDVEN in the last 168 hours.  Recent Results (from the past 240 hour(s))  Respiratory Panel by RT PCR (Flu A&B, Covid) - Nasopharyngeal Swab     Status: None   Collection Time: 03/03/20 12:23 AM   Specimen: Nasopharyngeal Swab  Result Value Ref Range Status   SARS Coronavirus 2 by RT PCR NEGATIVE NEGATIVE Final    Comment: (NOTE) SARS-CoV-2 target  nucleic acids are NOT DETECTED.  The SARS-CoV-2 RNA is generally detectable in upper respiratoy specimens during the acute phase of infection. The lowest concentration of SARS-CoV-2 viral copies this assay can detect is 131 copies/mL. A negative result does not preclude SARS-Cov-2 infection and should not be used as the sole basis for treatment or other patient management decisions. A negative result may occur with  improper specimen collection/handling, submission of specimen other than nasopharyngeal swab, presence of viral mutation(s) within the areas targeted by this assay, and inadequate number of viral copies (<131 copies/mL). A negative result must be combined with clinical observations, patient history, and epidemiological information. The expected result is Negative.  Fact Sheet for Patients:  PinkCheek.be  Fact Sheet for Healthcare Providers:  GravelBags.it  This test is no t yet approved or cleared by the [place] FDA and  has been authorized for detection and/or diagnosis of SARS-CoV-2 by FDA under an Emergency Use Authorization (EUA). This EUA will remain  in effect (meaning this test can be used) for the duration of the COVID-19 declaration under Section 564(b)(1) of the Act, 21 U.S.C. section 360bbb-3(b)(1), unless the authorization is terminated or revoked sooner.     Influenza A by PCR NEGATIVE NEGATIVE Final   Influenza B by PCR NEGATIVE NEGATIVE Final    Comment: (NOTE) The Xpert Xpress SARS-CoV-2/FLU/RSV assay is intended as an aid in  the diagnosis of influenza from Nasopharyngeal swab specimens and  should not be used as a sole basis for treatment. Nasal washings and  aspirates are unacceptable for Xpert Xpress SARS-CoV-2/FLU/RSV  testing.  Fact Sheet for Patients: PinkCheek.be  Fact Sheet for Healthcare  Providers: GravelBags.it  This test is not yet approved or cleared by the [place] FDA and  has been authorized for detection and/or diagnosis of SARS-CoV-2 by  FDA under an Emergency Use Authorization (EUA). This EUA will remain  in effect (meaning this test can be used) for the duration of the  Covid-19 declaration under Section 564(b)(1) of the Act, 21  U.S.C. section 360bbb-3(b)(1), unless the authorization is  terminated or revoked. Performed at [facility], [address], [place], [place] [address]   Surgical PCR screen     Status: None   Collection Time: 03/03/20  1:55 AM   Specimen: Nasal Mucosa; Nasal Swab  Result Value Ref Range Status   MRSA, PCR NEGATIVE NEGATIVE Final   Staphylococcus aureus NEGATIVE NEGATIVE Final    Comment: (NOTE) The Xpert SA Assay (FDA approved for NASAL specimens in patients 57 years of age and older), is one component of a comprehensive surveillance program. It is not intended to diagnose infection nor to guide or monitor treatment. Performed at [facility], [address] [place], [place] [address]          Radiology Studies: US THYROID  Result Date: 03/04/2020 CLINICAL DATA:  Palpable abnormality.  Enlarged thyroid gland. EXAM:  THYROID ULTRASOUND TECHNIQUE: Ultrasound examination of the thyroid gland and adjacent soft tissues was performed. COMPARISON:  None. FINDINGS: Parenchymal Echotexture: Normal Isthmus: 0.3 cm Right lobe: 4.3 x 2.2 x 1.9 cm Left lobe: 4.4 x 1.9 x 1.7 cm _________________________________________________________ Estimated total number of nodules >/= 1 cm: 0 Number of spongiform nodules >/=  2 cm not described below (TR1): 0 Number of mixed cystic and solid nodules >/= 1.5 cm not described below (TR2): 0 _________________________________________________________ No discrete nodules are seen within the thyroid gland. No abnormal lymph nodes identified. IMPRESSION:  Normal thyroid ultrasound. The thyroid gland is not enlarged and no focal nodules are identified. The above is in keeping with the ACR TI-RADS recommendations - J Am Coll Radiol 2017;14:587-595. Electronically Signed   By: Aletta Edouard M.D.   On: 03/04/2020 13:54   DG HIP OPERATIVE UNILAT W OR W/O PELVIS RIGHT  Result Date: 03/03/2020 CLINICAL DATA:  Total hip replacement EXAM: OPERATIVE RIGHT HIP   1 VIEW TECHNIQUE: Fluoroscopic spot image(s) were submitted for interpretation post-operatively. COMPARISON:  Pelvis radiograph March 02, 2020; CT and MR pelvis March 02, 2020 FLUOROSCOPY TIME:  0 minutes 6 seconds; 2 acquired images FINDINGS: Initial image demonstrates subcapital femoral neck fracture on the right. Subsequent image shows a total hip replacement right with visualized prosthetic components well-seated on frontal view. No fracture or dislocation evident post prosthetic placement. IMPRESSION: Total hip replacement on the right with visualized prosthetic components well-seated on frontal view. No fracture or dislocation evident on image demonstrating prosthesis placement. Electronically Signed   By: Lowella Grip III M.D.   On: 03/03/2020 13:53   DG HIP UNILAT W OR W/O PELVIS 2-3 VIEWS RIGHT  Result Date: 03/03/2020 CLINICAL DATA:  Post RIGHT total hip arthroplasty EXAM: DG HIP (WITH OR WITHOUT PELVIS) 2-3V RIGHT COMPARISON:  03/02/2020 FINDINGS: RIGHT hip prosthesis newly identified. No acute fracture or dislocation. Bones demineralized. Overlying skin clips and postsurgical changes of soft tissues. IMPRESSION: RIGHT hip prosthesis without acute complication. Electronically Signed   By: Lavonia Dana M.D.   On: 03/03/2020 14:13        Scheduled Meds: . aspirin  81 mg Oral Daily  . Chlorhexidine Gluconate Cloth  6 each Topical Daily  . cholecalciferol  1,000 Units Oral Daily  . docusate sodium  100 mg Oral BID  . enoxaparin (LOVENOX) injection  40 mg Subcutaneous Q24H  .  Oyster Shell Calcium/D  1 tablet Oral Daily  . pantoprazole  40 mg Oral Daily  . timolol  1 drop Both Eyes BID  . traMADol  50 mg Oral Q6H   Continuous Infusions: . methocarbamol (ROBAXIN) IV      Assessment & Plan:   Principal Problem:   Fracture of femoral neck, right, closed ([place]) Active Problems:   Essential hypertension   Chronic bilateral low back pain without sciatica   Benign prostatic hyperplasia with urinary frequency   PAD (peripheral artery disease) (HCC)   Chronic diastolic CHF (congestive heart failure) ([place]), EF 55-60% 10/2019   Ambulatory dysfunction   COPD with chronic bronchitis (HCC)   Glaucoma   Chronic anemia   Tracheal deviation   86 year old male with history of COPD, chronic diastolic heart failure, chronic anemia, HLD, HTN, chronic osteoarthritis and low back pain, BPH and glaucoma, who at baseline has limited ambulation, presenting with shortness of breath and 2-week history of left hip pain with no reported history of a fall as well as dyspnea on exertion to where it limits his  ability to participate in his ADLs .  Fracture of femoral neck, right, closed ([place])  Chronic bilateral low back pain without sciatica and osteoarthritis  Ambulatory dysfunction, acute on chronic -MRI as requested by [name] showing right femoral neck fracture. 11/13-status post right arthroplasty by [name] on 11/12  Per ortho MRI did not reveal any structural issues with Lt hip.  11/14- SNF pending Weight bearing as tolerated to Rt leg.  Bowel regimen Pain control     Shortness of breath -Patient having increased shortness of breath for the past couple weeks of uncertain etiology -Chest x-ray showing left tracheal deviation possibly due to thyroid enlargement, small pleural effusions bilaterally, cardiomegaly with normal pulmonary vascularity 11/12-cardiology input was appreciated, patient was at acceptable risk for surgery today. No further cardiac work-up is  necessary.  Several months ago echo revealed normal ejection fraction Echo with mild to moderate elevated RH pressures, likely improved with lasix 11/14- clinically stable. ivf was d/c'd  Use lasix prn when clinically indicated Daily weight, I/os    Left tracheal deviation on chest x-ray -Chest x-ray showing left tracheal deviation possibly due to enlarged thyroid -Patient endorses hoarseness and difficulty swallowing for the past month in addition to shortness of breath 11/14- thyroid US unremarkable.   Possible mild exacerbation of acute on chronic diastolic CHF (congestive heart failure) ([place]),  -Patient with a shortness of breath, mild lower extremity edema  11/13 -clinically has improved, asymptomatic  IV fluids discontinued  Cardiology following  Check daily weight and I's and O's       COPD with chronic bronchitis ([place]) -without acute exacerbation continue nebs/inhalers       Essential hypertension Stable , on low side.  Continue to hold lisinopril, resume when appropriate      Benign prostatic hyperplasia with urinary frequency -New Proscar    PAD (peripheral artery disease) (HCC) -Continue aspirin    Glaucoma -Continue timolol drops    Chronic anemia - Patient follows with Dr. Rogue Bussing H&H currently stable we will continue to monitor  DVT prophylaxis: lovenox Code Status:DNR Family Communication: called left vm for daughter. Status is: Inpatient  Remains inpatient appropriate because: Unsafe discharge  Dispo: The patient is from: Home              Anticipated d/c is to: SNF              Anticipated d/c date is: 2 days              Patient currently is medically  Stable. snf pending per case mx today Family interested in [place]        LOS: 3 days   Time spent: 35 min with >50% on coc   Nolberto Hanlon, MD [facility] Pager [phone]  If 7PM-7AM, please contact night-coverage [email or web address] Password  [facility] 03/05/2020, 8:41 AM

## 2020-03-05 NOTE — Progress Notes (Signed)
Physical Therapy Treatment Patient Details Name: Raymond Barnett MRN: [id number] DOB: [birth year] Today's Date: 03/05/2020    History of Present Illness Pt is an 86 y/o M admitted on 03/02/20 with c/o SOB, generalized weakness, Gradually progressively worsening  L hip pain beginning 3-4 weeks ago. Imaging revealed small B pleural effusions, MRI reveals acute impacted subcapital R femoral neck fx. Pt underwent R hip hemiarthroplasty ([name]) on 03/03/20. PMH: COPD, HLD, HTN, HFpEF, arthritis, B cataracts, lumbar DDD, glaucoma, h/o CTS, hx of osteomyelitis in RLE, moderate MR, PAD    PT Comments    Pt is very self limiting citing multiple reasons (pain, fatigue, difficulty moving, etc) with PT educating pt on importance of OOB mobility & participation to increase strength, decrease pain & increase independence with functional mobility. Pt performs the following RLE strengthening exercises: quad sets, short arc quads, hip abduction (AAROM), and heel slides (AAROM with very minimal ROM), as well as LAQ seated EOB. Pt requires increased assist for supine>sit on this date, but min assist for transfers. Pt was able to progress to ambulating 5 ft with RW & min assist (2nd person following with recliner) with pt having to sit 2/2 fatigue. Will continue to follow pt acutely to focus on gait training & RLE strengthening.   Oxygen:  2L/min via nasal cannula at rest = 97% Room air with conversation dropped to 85% so placed back on 2L/min & pt requires extra time to increase >90%  After ambulation SpO2 dropped to 83%, requires cuing for pursed lip breathing to increase >90% Nurse aware of desaturation with mobility.   Pt with c/o "lightheadedness" after transerring to recliner: BP = 141/68 mmHg (RUE), HR = 96 bpm. Rest break provided & no other c/o sx reported.     Follow Up Recommendations  SNF;Supervision for mobility/OOB     Equipment Recommendations  Rolling walker with 5" wheels     Recommendations for Other Services       Precautions / Restrictions Precautions Precautions: Fall Precaution Comments: anterior hip (no precautions) Restrictions Weight Bearing Restrictions: Yes RLE Weight Bearing: Weight bearing as tolerated    Mobility  Bed Mobility Overal bed mobility: Needs Assistance Bed Mobility: Supine to Sit     Supine to sit: Mod assist;HOB elevated     General bed mobility comments: cuing to transfer RLE to EOB, mod assist to upright trunk  Transfers Overall transfer level: Needs assistance Equipment used: Rolling walker (2 wheeled) Transfers: Sit to/from [facility] Sit to Stand: Min assist Stand pivot transfers: Min assist       General transfer comment: cuing for safe hand placement during transfers & for hand placement on RW  Ambulation/Gait Ambulation/Gait assistance: Min assist Gait Distance (Feet): 5 Feet Assistive device: Rolling walker (2 wheeled) Gait Pattern/deviations: Decreased step length - right;Decreased step length - left;Decreased stance time - right;Decreased stride length;Decreased weight shift to right (no ankle ROM RLE 2/2 hx of ankle fusion) Gait velocity: decreased   General Gait Details: heavy reliance on RW   Stairs             Wheelchair Mobility    Modified Rankin (Stroke Patients Only)       Balance Overall balance assessment: Needs assistance Sitting-balance support: Feet supported Sitting balance-Leahy Scale: Good Sitting balance - Comments: no LOB noted static sitting EOB   Standing balance support: Bilateral upper extremity supported;During functional activity Standing balance-Leahy Scale: Fair Standing balance comment: BUE support on RW durin gstanding/stand pivot transfers  Cognition Arousal/Alertness: Awake/alert Behavior During Therapy: WFL for tasks assessed/performed Overall Cognitive Status: Within Functional Limits for tasks  assessed                                 General Comments: HOH, self limiting at times, requires MAX encouragement/education      Exercises      General Comments        Pertinent Vitals/Pain Pain Assessment: 0-10 Pain Score: 5  Pain Location: R hip, L low back Pain Descriptors / Indicators: Sore;Grimacing;Guarding Pain Intervention(s): Limited activity within patient's tolerance;Repositioned;RN gave pain meds during session    Home Living                      Prior Function            PT Goals (current goals can now be found in the care plan section) Acute Rehab PT Goals Patient Stated Goal: to get better PT Goal Formulation: With patient Time For Goal Achievement: 03/18/20 Potential to Achieve Goals: Good Progress towards PT goals: Progressing toward goals    Frequency    BID      PT Plan Current plan remains appropriate    Co-evaluation              AM-PAC PT "6 Clicks" Mobility   Outcome Measure  Help needed turning from your back to your side while in a flat bed without using bedrails?: A Little Help needed moving from lying on your back to sitting on the side of a flat bed without using bedrails?: A Lot Help needed moving to and from a bed to a chair (including a wheelchair)?: A Little Help needed standing up from a chair using your arms (e.g., wheelchair or bedside chair)?: A Little Help needed to walk in hospital room?: A Little Help needed climbing 3-5 steps with a railing? : A Lot 6 Click Score: 16    End of Session Equipment Utilized During Treatment: Oxygen;Gait belt Activity Tolerance: Patient tolerated treatment well Patient left: in chair;with call bell/phone within reach;with chair alarm set;with SCD's reapplied Nurse Communication: Mobility status (oxygen) PT Visit Diagnosis: Muscle weakness (generalized) (M62.81);Difficulty in walking, not elsewhere classified (R26.2);Pain Pain - Right/Left: Right Pain -  part of body: Hip     Time: 9643-8381 PT Time Calculation (min) (ACUTE ONLY): 47 min  Charges:  $Therapeutic Exercise: 8-22 mins $Therapeutic Activity: 23-37 mins                     Raymond Barnett, PT, DPT 03/05/20, 10:06 AM    Raymond Barnett 03/05/2020, 10:03 AM

## 2020-03-05 NOTE — Plan of Care (Signed)
  Problem: Activity: Goal: Risk for activity intolerance will decrease Outcome: Progressing   Problem: Elimination: Goal: Will not experience complications related to bowel motility Outcome: Progressing   Problem: Pain Managment: Goal: General experience of comfort will improve Outcome: Progressing   Problem: Skin Integrity: Goal: Risk for impaired skin integrity will decrease Outcome: Progressing   Problem: Activity: Goal: Ability to ambulate and perform ADLs will improve Outcome: Progressing   Problem: Self-Concept: Goal: Ability to maintain and perform role responsibilities to the fullest extent possible will improve Outcome: Progressing   Problem: Pain Management: Goal: Pain level will decrease Outcome: Progressing

## 2020-03-05 NOTE — Progress Notes (Addendum)
  Subjective: 2 Days Post-Op Procedure(s) (LRB): ARTHROPLASTY BIPOLAR HIP (HEMIARTHROPLASTY) (Right) Patient reports pain as well-controlled in the right hip, but does complain about back and L hip pain. Plan is to go Rehab after hospital stay. Negative for chest pain and shortness of breath Fever: no Gastrointestinal: negative for nausea and vomiting.  Patient has not had a bowel movement.  Objective: Vital signs in last 24 hours: Temp:  [97.3 F (36.3 C)-98.8 F (37.1 C)] 97.9 F (36.6 C) (11/14 0743) Pulse Rate:  [76-92] 84 (11/14 0743) Resp:  [16-19] 16 (11/14 0743) BP: (102-118)/(60-67) 118/63 (11/14 0743) SpO2:  [97 %-100 %] 100 % (11/14 0743) Weight:  [100.9 kg] 100.9 kg (11/14 0401)  Intake/Output from previous day:  Intake/Output Summary (Last 24 hours) at 03/05/2020 0915 Last data filed at 03/05/2020 0554 Gross per 24 hour  Intake 240 ml  Output 500 ml  Net -260 ml    Intake/Output this shift: No intake/output data recorded.  Labs: Recent Labs    03/02/20 1228 03/03/20 0025 03/04/20 0400 03/05/20 0543  HGB 11.6* 10.9* 8.9* 8.8*   Recent Labs    03/04/20 0400 03/05/20 0543  WBC 11.0* 8.8  RBC 2.75* 2.74*  HCT 27.1* 26.6*  PLT 123* 119*   Recent Labs    03/03/20 0025 03/04/20 0400  NA 130* 132*  K 4.2 4.5  CL 96* 99  CO2 24 27  BUN 20 22  CREATININE 0.78 0.92  GLUCOSE 129* 105*  CALCIUM 8.5* 8.3*   No results for input(s): LABPT, INR in the last 72 hours.   EXAM General - Patient is Alert, Appropriate and Oriented Extremity - Compartment soft, unable to dorsiflex ankle (pre-existing), sensation absent over the superficial and deep fibular distributions (pre-existing), intact over lateral sural cutaneous and saphenous Dressing/Incision -Prevena in place and working, seal is maintained, no drainage noted Motor Function - intact, moving foot and toes well on exam.    Assessment/Plan: 2 Days Post-Op Procedure(s) (LRB): ARTHROPLASTY  BIPOLAR HIP (HEMIARTHROPLASTY) (Right) Principal Problem:   Fracture of femoral neck, right, closed ([place]) Active Problems:   Essential hypertension   Chronic bilateral low back pain without sciatica   Benign prostatic hyperplasia with urinary frequency   PAD (peripheral artery disease) (HCC)   Chronic diastolic CHF (congestive heart failure) (HCC), EF 55-60% 10/2019   Ambulatory dysfunction   COPD with chronic bronchitis (HCC)   Glaucoma   Chronic anemia   Tracheal deviation  Estimated body mass index is 31.92 kg/m as calculated from the following:   Height as of this encounter: 5\' 10"  (1.778 m).   Weight as of this encounter: 100.9 kg. Advance diet Up with therapy  Patient will be d/c to SNF pending bed placement and medical clearance.  Stressed importance of moving with therapy to decrease back pain and stiffness.   DVT Prophylaxis - Lovenox, Ted hose and SCDs Weight-Bearing as tolerated to right leg  Cassell Smiles, PA-C [facility] Orthopaedic Surgery 03/05/2020, 9:15 AM

## 2020-03-05 NOTE — Plan of Care (Signed)
  Problem: Education: Goal: Knowledge of General Education information will improve Description: Including pain rating scale, medication(s)/side effects and non-pharmacologic comfort measures Outcome: Progressing   Problem: Activity: Goal: Risk for activity intolerance will decrease Outcome: Progressing   Problem: Nutrition: Goal: Adequate nutrition will be maintained Outcome: Progressing   Problem: Elimination: Goal: Will not experience complications related to bowel motility Outcome: Progressing Goal: Will not experience complications related to urinary retention Outcome: Progressing   Problem: Pain Managment: Goal: General experience of comfort will improve Outcome: Progressing   Problem: Safety: Goal: Ability to remain free from injury will improve Outcome: Progressing   Problem: Skin Integrity: Goal: Risk for impaired skin integrity will decrease Outcome: Progressing   Problem: Activity: Goal: Ability to ambulate and perform ADLs will improve Outcome: Progressing   Problem: Clinical Measurements: Goal: Postoperative complications will be avoided or minimized Outcome: Progressing   Problem: Self-Concept: Goal: Ability to maintain and perform role responsibilities to the fullest extent possible will improve Outcome: Progressing   Problem: Pain Management: Goal: Pain level will decrease Outcome: Progressing

## 2020-03-06 ENCOUNTER — Encounter: Payer: Self-pay | Admitting: Orthopedic Surgery

## 2020-03-06 ENCOUNTER — Ambulatory Visit: Payer: Medicare Other | Admitting: Pulmonary Disease

## 2020-03-06 MED ORDER — ENOXAPARIN SODIUM 40 MG/0.4ML ~~LOC~~ SOLN: 40.0000 mg | SUBCUTANEOUS | 0 refills | Status: AC

## 2020-03-06 MED ORDER — HYDROCODONE-ACETAMINOPHEN 5-325 MG PO TABS
1.0000 | ORAL_TABLET | ORAL | 0 refills | Status: AC | PRN
Start: 2020-03-06 — End: ?

## 2020-03-06 MED ORDER — POLYETHYLENE GLYCOL 3350 17 G PO PACK
17.0000 g | PACK | Freq: Every day | ORAL | Status: DC
  Administered 2020-03-07: 17 g via ORAL
  Filled 2020-03-06: qty 1

## 2020-03-06 MED ORDER — FLEET ENEMA 7-19 GM/118ML RE ENEM: 1.0000 | ENEMA | Freq: Once | RECTAL | Status: DC

## 2020-03-06 NOTE — Progress Notes (Signed)
Physical Therapy Treatment Patient Details Name: Raymond Barnett MRN: [id number] DOB: 1929/12/12 Today's Date: 03/06/2020    History of Present Illness Pt is an 86 y/o M admitted on 03/02/20 with c/o SOB, generalized weakness, Gradually progressively worsening  L hip pain beginning 3-4 weeks ago. Imaging revealed small B pleural effusions, MRI reveals acute impacted subcapital R femoral neck fx. Pt underwent R hip hemiarthroplasty ([name]) on 03/03/20. PMH: COPD, HLD, HTN, HFpEF, arthritis, B cataracts, lumbar DDD, glaucoma, h/o CTS, hx of osteomyelitis in RLE, moderate MR, PAD    PT Comments    Pt received in bed, reluctantly agreeable to tx. Pt requires less assistance for supine>sit on this date & is able to perform 3 sets of 10 reps of LAQ (pt requires cuing to perform exercise with RLE vs LLE) for strengthening with improving ROM. Pt transfers sit<>stand EOB with min assist with cuing for safe hand placement & technique for transfer. Pt reports dizziness when standing EOB so instructed to sit. Pt sits EOB ~5 minutes but reports no improvement in sx & begins to report nausea. BP = 141/67 mmHg (LUE), HR = 87 bpm, SpO2 >90% throughout. Pt assisted back to bed 2/2 no improvement in symptoms & nurse made aware of pt's c/o; pt takes side steps at EOB with min assist +2 for safety with RW. Will continue to follow pt acutely. PT continues to educate pt on importance of OOB mobility.     Follow Up Recommendations  SNF;Supervision for mobility/OOB     Equipment Recommendations  Rolling walker with 5" wheels    Recommendations for Other Services       Precautions / Restrictions Precautions Precautions: Fall Precaution Comments: anterior hip (no precautions) Restrictions Weight Bearing Restrictions: Yes RLE Weight Bearing: Weight bearing as tolerated    Mobility  Bed Mobility Overal bed mobility: Needs Assistance Bed Mobility: Supine to Sit;Sit to Supine     Supine to sit: Min  guard;HOB elevated Sit to supine: Mod assist;HOB elevated   General bed mobility comments: Pt able to transfer supine>sit with CGA with hospital bed features & extra time but requires mod assist to elevate BLE onto bed for sit>supine.  Transfers Overall transfer level: Needs assistance Equipment used: Rolling walker (2 wheeled) Transfers: Sit to/from Stand Sit to Stand: Min assist         General transfer comment: cuing for safe hand placement on RW during transfers  Ambulation/Gait Ambulation/Gait assistance: Min assist;+2 safety/equipment Gait Distance (Feet): 3 Feet Assistive device: Rolling walker (2 wheeled)   Gait velocity: decreased   General Gait Details: side steps to R to [place]             Wheelchair Mobility    Modified Rankin (Stroke Patients Only)       Balance Overall balance assessment: Needs assistance Sitting-balance support: Feet supported Sitting balance-Leahy Scale: Good Sitting balance - Comments: pt able to sit EOB ~5 minutes without assist & without LOB noted   Standing balance support: Bilateral upper extremity supported;During functional activity Standing balance-Leahy Scale: Fair Standing balance comment: BUE support on RW during standing                            Cognition Arousal/Alertness: Awake/alert Behavior During Therapy: WFL for tasks assessed/performed Overall Cognitive Status: Within Functional Limits for tasks assessed  Exercises      General Comments        Pertinent Vitals/Pain Pain Assessment: 0-10 Pain Score: 8  Pain Location: L hip Pain Descriptors / Indicators: Aching Pain Intervention(s): Monitored during session (pt reports he is premedicated)    Home Living                      Prior Function            PT Goals (current goals can now be found in the care plan section) Acute Rehab PT Goals Patient Stated Goal:  to get better PT Goal Formulation: With patient Time For Goal Achievement: 03/18/20 Potential to Achieve Goals: Fair Progress towards PT goals: Progressing toward goals    Frequency    BID      PT Plan Current plan remains appropriate    Co-evaluation              AM-PAC PT "6 Clicks" Mobility   Outcome Measure  Help needed turning from your back to your side while in a flat bed without using bedrails?: A Little Help needed moving from lying on your back to sitting on the side of a flat bed without using bedrails?: A Little Help needed moving to and from a bed to a chair (including a wheelchair)?: A Little Help needed standing up from a chair using your arms (e.g., wheelchair or bedside chair)?: A Little Help needed to walk in hospital room?: A Little Help needed climbing 3-5 steps with a railing? : A Lot 6 Click Score: 17    End of Session Equipment Utilized During Treatment: Oxygen;Gait belt Activity Tolerance:  (pt limited by c/o dizziness) Patient left: in bed;with bed alarm set;with SCD's reapplied;with call bell/phone within reach Nurse Communication: Mobility status (c/o dizziness) PT Visit Diagnosis: Muscle weakness (generalized) (M62.81);Difficulty in walking, not elsewhere classified (R26.2);Pain Pain - Right/Left: Left Pain - part of body: Hip     Time: 7494-4967 PT Time Calculation (min) (ACUTE ONLY): 26 min  Charges:  $Therapeutic Activity: 23-37 mins                     Raymond Barnett, PT, DPT 03/06/20, 3:07 PM    Raymond Barnett 03/06/2020, 2:54 PM

## 2020-03-06 NOTE — TOC Progression Note (Addendum)
Transition of Care [facility]) - Progression Note    Patient Details  Name: Raymond Barnett MRN: [id number] Date of Birth: 29-Jun-1929  Transition of Care [facility]) CM/SW [place], LCSW Phone Number: 03/06/2020, 10:30 AM  Clinical Narrative:   Per TOC notes, [facility] is patient's preferred SNF. CSW reached out to [place] at [facility] who reported they would have a bed for patient tomorrow if patient was medically ready for discharge. CSW asked MD when patient will likely be medically ready.  10:45- Per MD patient can go to [facility]. CSW asked Magda Paganini if patient needs another COVID test, last one was 11/12. Magda Paganini reported no new COVID test is needed. CSW updated patient.         Expected Discharge Plan and Services                                                 Social Determinants of Health (SDOH) Interventions    Readmission Risk Interventions No flowsheet data found.

## 2020-03-06 NOTE — Progress Notes (Signed)
Patient educated on importance of having a bowel movement. Patient has had no bowel movement this hospital stay. MD has ordered fleet and miralax and patient is refusing even after being educated and stated he understood but only willing to take the prn milk of Mg and will not do the suppository.

## 2020-03-06 NOTE — Progress Notes (Addendum)
  Subjective: 3 Days Post-Op Procedure(s) (LRB): ARTHROPLASTY BIPOLAR HIP (HEMIARTHROPLASTY) (Right) Patient reports pain as well-controlled Plan is to go Rehab after hospital stay.  Slow progress of physical therapy Negative for chest pain and shortness of breath Fever: no Gastrointestinal: negative for nausea and vomiting.   Objective: Vital signs in last 24 hours: Temp:  [97.8 F (36.6 C)-99.2 F (37.3 C)] 98 F (36.7 C) (11/15 0727) Pulse Rate:  [79-91] 87 (11/15 0727) Resp:  [16-18] 18 (11/15 0727) BP: (114-149)/(54-84) 137/74 (11/15 0727) SpO2:  [92 %-98 %] 92 % (11/15 0727) Weight:  [102.4 kg] 102.4 kg (11/15 0523)  Intake/Output from previous day:  Intake/Output Summary (Last 24 hours) at 03/06/2020 0814 Last data filed at 03/06/2020 0500 Gross per 24 hour  Intake 360 ml  Output 375 ml  Net -15 ml    Intake/Output this shift: No intake/output data recorded.  Labs: Recent Labs    03/04/20 0400 03/05/20 0543  HGB 8.9* 8.8*   Recent Labs    03/04/20 0400 03/05/20 0543  WBC 11.0* 8.8  RBC 2.75* 2.74*  HCT 27.1* 26.6*  PLT 123* 119*   Recent Labs    03/04/20 0400  NA 132*  K 4.5  CL 99  CO2 27  BUN 22  CREATININE 0.92  GLUCOSE 105*  CALCIUM 8.3*   No results for input(s): LABPT, INR in the last 72 hours.   EXAM General - Patient is Alert, Appropriate and Oriented Extremity - Compartment soft, unable to dorsiflex ankle (pre-existing), sensation absent over the superficial and deep fibular distributions (pre-existing), intact over lateral sural cutaneous and saphenous Dressing/Incision -Prevena in place and working, seal is maintained, no drainage noted    Assessment/Plan: 3 Days Post-Op Procedure(s) (LRB): ARTHROPLASTY BIPOLAR HIP (HEMIARTHROPLASTY) (Right) Principal Problem:   Fracture of femoral neck, right, closed ([place]) Active Problems:   Essential hypertension   Chronic bilateral low back pain without sciatica   Benign prostatic  hyperplasia with urinary frequency   PAD (peripheral artery disease) (HCC)   Chronic diastolic CHF (congestive heart failure) (HCC), EF 55-60% 10/2019   Ambulatory dysfunction   COPD with chronic bronchitis (HCC)   Glaucoma   Chronic anemia   Tracheal deviation  Estimated body mass index is 32.39 kg/m as calculated from the following:   Height as of this encounter: 5\' 10"  (1.778 m).   Weight as of this encounter: 102.4 kg. Advance diet Up with therapy Pain well controlled Labs and vital signs are stable Care manager to assist with discharge to skilled nursing facility  Follow-up with [facility] orthopedics in 2 weeks Lovenox 40 mg subcu daily x14 days at discharge TED hose bilateral lower extremity x6 weeks Please remove provena negative pressure dressing on 03/13/2020 and apply honey comb dressing. Keep dressing clean and dry at all times.      DVT Prophylaxis - Lovenox, Ted hose and SCDs Weight-Bearing as tolerated to right leg  T. Rachelle Hora PA-C [facility] Orthopaedic Surgery 03/06/2020, 8:14 AM

## 2020-03-06 NOTE — Progress Notes (Signed)
Physical Therapy Treatment Patient Details Name: Raymond Barnett MRN: [id number] DOB: Sep 13, 1929 Today's Date: 03/06/2020    History of Present Illness Pt is an 86 y/o M admitted on 03/02/20 with c/o SOB, generalized weakness, Gradually progressively worsening  L hip pain beginning 3-4 weeks ago. Imaging revealed small B pleural effusions, MRI reveals acute impacted subcapital R femoral neck fx. Pt underwent R hip hemiarthroplasty ([name]) on 03/03/20. PMH: COPD, HLD, HTN, HFpEF, arthritis, B cataracts, lumbar DDD, glaucoma, h/o CTS, hx of osteomyelitis in RLE, moderate MR, PAD    PT Comments    Pt received in bed reporting significant L Hip pain early this morning but feeling better now after receiving pain meds. Pt declines OOB mobility despite PT providing MAX encouragement/education re: benefits of OOB mobility & disadvantages of staying in bed for prolonged period of time. Pt agreeable to bed level exercises & performs RLE: short arc quads (improved eccentric control with lowering), quad sets, hip abduction (AAROM), hip adduction squeezes, heel slides (AAROM, improved ROM), and straight leg raises (AAROM) all 1 set x 15 reps with cuing to count repetitions. Pt scoots to [facility] with modifications as noted below with pt not electing to use LE to assist with pushing. Pt encouraged to participate in OOB mobility during PM session.  Pt on 2L/min supplemental oxygen, SpO2 = 95%.    Follow Up Recommendations  SNF;Supervision for mobility/OOB     Equipment Recommendations  Rolling walker with 5" wheels    Recommendations for Other Services       Precautions / Restrictions Precautions Precautions: Fall Precaution Comments: anterior hip (no precautions) Restrictions Weight Bearing Restrictions: Yes RLE Weight Bearing: Weight bearing as tolerated    Mobility  Bed Mobility               General bed mobility comments: pt scoots to [place] on bed rails with bed in  trendelenburg position, PT instructs pt to use LLE to push but pt declines  Transfers                    Ambulation/Gait                 Stairs             Wheelchair Mobility    Modified Rankin (Stroke Patients Only)       Balance                                            Cognition Arousal/Alertness: Awake/alert;Lethargic (at times pt closing eyes during exercises with cuing for increased attention to task at hand) Behavior During Therapy: [facility] for tasks assessed/performed Overall Cognitive Status: Within Functional Limits for tasks assessed                                 General Comments: pt self limiting requiring max encouragement/education re: benefits of participation      Exercises      General Comments        Pertinent Vitals/Pain Pain Assessment: 0-10 Pain Score: 4  Pain Location: L hip Pain Descriptors / Indicators: Aching;Sore Pain Intervention(s): Monitored during session;Repositioned (pt reports he is premedicated)    Home Living  Prior Function            PT Goals (current goals can now be found in the care plan section) Acute Rehab PT Goals Patient Stated Goal: to get better PT Goal Formulation: With patient Time For Goal Achievement: 03/18/20 Potential to Achieve Goals: Fair Progress towards PT goals: Progressing toward goals    Frequency    BID      PT Plan Current plan remains appropriate    Co-evaluation              AM-PAC PT "6 Clicks" Mobility   Outcome Measure  Help needed turning from your back to your side while in a flat bed without using bedrails?: A Little Help needed moving from lying on your back to sitting on the side of a flat bed without using bedrails?: A Lot Help needed moving to and from a bed to a chair (including a wheelchair)?: A Little Help needed standing up from a chair using your arms (e.g., wheelchair or  bedside chair)?: A Little Help needed to walk in hospital room?: A Little Help needed climbing 3-5 steps with a railing? : A Lot 6 Click Score: 16    End of Session Equipment Utilized During Treatment: Oxygen Activity Tolerance: Patient tolerated treatment well Patient left: with SCD's reapplied;with call bell/phone within reach;in bed;with bed alarm set (BLE elevated on towel rolls)   PT Visit Diagnosis: Muscle weakness (generalized) (M62.81);Difficulty in walking, not elsewhere classified (R26.2);Pain Pain - Right/Left: Left Pain - part of body: Hip     Time: 9480-1655 PT Time Calculation (min) (ACUTE ONLY): 24 min  Charges:  $Therapeutic Exercise: 8-22 mins $Therapeutic Activity: 8-22 mins                     Lavone Nian, PT, DPT 03/06/20, 10:19 AM    Waunita Schooner 03/06/2020, 10:16 AM

## 2020-03-06 NOTE — Progress Notes (Signed)
PROGRESS NOTE    Raymond Barnett  QHU:[id number] DOB: 1929-12-04 DOA: 03/02/2020 PCP: [name], DO    Brief Narrative:  OLAND Barnett is a 86 y.o. male with medical history significant for COPD, chronic diastolic heart failure, chronic anemia, HLD, HTN, chronic osteoarthritis and low back pain, BPH and glaucoma, who at baseline has limited ambulation, using motorized wheelchair prescribed by his provider for the past 2 months was brought into the emergency room at the recommendation of his PCP due to a complaint of persistent and left hip pain which has acutely worsened his ability to ambulate.left hip x-ray but then went on to have CT pelvis followed by MRI pelvis that showed right impacted subcapital femoral neck fracture.   111/12- went to OR today.Cardiology saw pt for cardiac clearance 11/13- PT recommends SNF 11/14-no over night issues. SNF placement pending, d/w case mx 11/15- no overnight issues. Couldn't sleep well last night. NO BM   Consultants:   Cardiology, orthopedics  Procedures:   Antimicrobials:    cefazolin x3 doses   Subjective: Other than mild hip pain and getting no sleep last night he has no other complaints.  Denies chest pain, shortness of breath  Objective: Vitals:   03/06/20 0005 03/06/20 0425 03/06/20 0523 03/06/20 0727  BP: 132/84 114/70  137/74  Pulse: 85 79  87  Resp: 18 16  18   Temp: 98 F (36.7 C) 98.7 F (37.1 C)  98 F (36.7 C)  TempSrc: Oral   Oral  SpO2: 94% 98%  92%  Weight:   102.4 kg   Height:        Intake/Output Summary (Last 24 hours) at 03/06/2020 0819 Last data filed at 03/06/2020 0817 Gross per 24 hour  Intake 360 ml  Output 475 ml  Net -115 ml   Filed Weights   03/04/20 0639 03/05/20 0401 03/06/20 0523  Weight: 101.2 kg 100.9 kg 102.4 kg    Examination: Calm comfortable, NAD CTA no wheeze rales rhonchi's Regular S1-S2 no gallops Soft benign positive bowel sounds No edema Alert oriented x3,  grossly intact    Data Reviewed: I have personally reviewed following labs and imaging studies  CBC: Recent Labs  Lab 03/02/20 1228 03/03/20 0025 03/04/20 0400 03/05/20 0543  WBC 9.9 10.4 11.0* 8.8  HGB 11.6* 10.9* 8.9* 8.8*  HCT 35.5* 33.1* 27.1* 26.6*  MCV 97.8 97.9 98.5 97.1  PLT 158 123* 123* 465*   Basic Metabolic Panel: Recent Labs  Lab 03/02/20 1228 03/03/20 0025 03/04/20 0400  NA 132* 130* 132*  K 4.4 4.2 4.5  CL 96* 96* 99  CO2 25 24 27   GLUCOSE 104* 129* 105*  BUN 19 20 22   CREATININE 0.84 0.78 0.92  CALCIUM 8.8* 8.5* 8.3*   GFR: Estimated Creatinine Clearance: 66.6 mL/min (by C-G formula based on SCr of 0.92 mg/dL). Liver Function Tests: No results for input(s): AST, ALT, ALKPHOS, BILITOT, PROT, ALBUMIN in the last 168 hours. No results for input(s): LIPASE, AMYLASE in the last 168 hours. No results for input(s): AMMONIA in the last 168 hours. Coagulation Profile: No results for input(s): INR, PROTIME in the last 168 hours. Cardiac Enzymes: No results for input(s): CKTOTAL, CKMB, CKMBINDEX, TROPONINI in the last 168 hours. BNP (last 3 results) No results for input(s): PROBNP in the last 8760 hours. HbA1C: No results for input(s): HGBA1C in the last 72 hours. CBG: No results for input(s): GLUCAP in the last 168 hours. Lipid Profile: No results for input(s): CHOL,  HDL, LDLCALC, TRIG, CHOLHDL, LDLDIRECT in the last 72 hours. Thyroid Function Tests: No results for input(s): TSH, T4TOTAL, FREET4, T3FREE, THYROIDAB in the last 72 hours. Anemia Panel: No results for input(s): VITAMINB12, FOLATE, FERRITIN, TIBC, IRON, RETICCTPCT in the last 72 hours. Sepsis Labs: No results for input(s): PROCALCITON, LATICACIDVEN in the last 168 hours.  Recent Results (from the past 240 hour(s))  Respiratory Panel by RT PCR (Flu A&B, Covid) - Nasopharyngeal Swab     Status: None   Collection Time: 03/03/20 12:23 AM   Specimen: Nasopharyngeal Swab  Result Value Ref  Range Status   SARS Coronavirus 2 by RT PCR NEGATIVE NEGATIVE Final    Comment: (NOTE) SARS-CoV-2 target nucleic acids are NOT DETECTED.  The SARS-CoV-2 RNA is generally detectable in upper respiratoy specimens during the acute phase of infection. The lowest concentration of SARS-CoV-2 viral copies this assay can detect is 131 copies/mL. A negative result does not preclude SARS-Cov-2 infection and should not be used as the sole basis for treatment or other patient management decisions. A negative result may occur with  improper specimen collection/handling, submission of specimen other than nasopharyngeal swab, presence of viral mutation(s) within the areas targeted by this assay, and inadequate number of viral copies (<131 copies/mL). A negative result must be combined with clinical observations, patient history, and epidemiological information. The expected result is Negative.  Fact Sheet for Patients:  PinkCheek.be  Fact Sheet for Healthcare Providers:  GravelBags.it  This test is no t yet approved or cleared by the [place] FDA and  has been authorized for detection and/or diagnosis of SARS-CoV-2 by FDA under an Emergency Use Authorization (EUA). This EUA will remain  in effect (meaning this test can be used) for the duration of the COVID-19 declaration under Section 564(b)(1) of the Act, 21 U.S.C. section 360bbb-3(b)(1), unless the authorization is terminated or revoked sooner.     Influenza A by PCR NEGATIVE NEGATIVE Final   Influenza B by PCR NEGATIVE NEGATIVE Final    Comment: (NOTE) The Xpert Xpress SARS-CoV-2/FLU/RSV assay is intended as an aid in  the diagnosis of influenza from Nasopharyngeal swab specimens and  should not be used as a sole basis for treatment. Nasal washings and  aspirates are unacceptable for Xpert Xpress SARS-CoV-2/FLU/RSV  testing.  Fact Sheet for  Patients: PinkCheek.be  Fact Sheet for Healthcare Providers: GravelBags.it  This test is not yet approved or cleared by the [place] FDA and  has been authorized for detection and/or diagnosis of SARS-CoV-2 by  FDA under an Emergency Use Authorization (EUA). This EUA will remain  in effect (meaning this test can be used) for the duration of the  Covid-19 declaration under Section 564(b)(1) of the Act, 21  U.S.C. section 360bbb-3(b)(1), unless the authorization is  terminated or revoked. Performed at [facility], [address] [place], [place] [address]   Surgical PCR screen     Status: None   Collection Time: 03/03/20  1:55 AM   Specimen: Nasal Mucosa; Nasal Swab  Result Value Ref Range Status   MRSA, PCR NEGATIVE NEGATIVE Final   Staphylococcus aureus NEGATIVE NEGATIVE Final    Comment: (NOTE) The Xpert SA Assay (FDA approved for NASAL specimens in patients 46 years of age and older), is one component of a comprehensive surveillance program. It is not intended to diagnose infection nor to guide or monitor treatment. Performed at [facility], [address] [place], [place] [address]  Radiology Studies: US THYROID  Result Date: 03/04/2020 CLINICAL DATA:  Palpable abnormality.  Enlarged thyroid gland. EXAM: THYROID ULTRASOUND TECHNIQUE: Ultrasound examination of the thyroid gland and adjacent soft tissues was performed. COMPARISON:  None. FINDINGS: Parenchymal Echotexture: Normal Isthmus: 0.3 cm Right lobe: 4.3 x 2.2 x 1.9 cm Left lobe: 4.4 x 1.9 x 1.7 cm _________________________________________________________ Estimated total number of nodules >/= 1 cm: 0 Number of spongiform nodules >/=  2 cm not described below (TR1): 0 Number of mixed cystic and solid nodules >/= 1.5 cm not described below (TR2): 0 _________________________________________________________ No discrete nodules are  seen within the thyroid gland. No abnormal lymph nodes identified. IMPRESSION: Normal thyroid ultrasound. The thyroid gland is not enlarged and no focal nodules are identified. The above is in keeping with the ACR TI-RADS recommendations - J Am Coll Radiol 2017;14:587-595. Electronically Signed   By: Aletta Edouard M.D.   On: 03/04/2020 13:54        Scheduled Meds: . aspirin  81 mg Oral Daily  . Chlorhexidine Gluconate Cloth  6 each Topical Daily  . cholecalciferol  1,000 Units Oral Daily  . docusate sodium  100 mg Oral BID  . enoxaparin (LOVENOX) injection  40 mg Subcutaneous Q24H  . Oyster Shell Calcium/D  1 tablet Oral Daily  . pantoprazole  40 mg Oral Daily  . timolol  1 drop Both Eyes BID  . traMADol  50 mg Oral Q6H   Continuous Infusions: . methocarbamol (ROBAXIN) IV      Assessment & Plan:   Principal Problem:   Fracture of femoral neck, right, closed ([place]) Active Problems:   Essential hypertension   Chronic bilateral low back pain without sciatica   Benign prostatic hyperplasia with urinary frequency   PAD (peripheral artery disease) (HCC)   Chronic diastolic CHF (congestive heart failure) ([place]), EF 55-60% 10/2019   Ambulatory dysfunction   COPD with chronic bronchitis (HCC)   Glaucoma   Chronic anemia   Tracheal deviation   86 year old male with history of COPD, chronic diastolic heart failure, chronic anemia, HLD, HTN, chronic osteoarthritis and low back pain, BPH and glaucoma, who at baseline has limited ambulation, presenting with shortness of breath and 2-week history of left hip pain with no reported history of a fall as well as dyspnea on exertion to where it limits his ability to participate in his ADLs .  Fracture of femoral neck, right, closed ([place])  Chronic bilateral low back pain without sciatica and osteoarthritis  Ambulatory dysfunction, acute on chronic -MRI as requested by [name] showing right femoral neck fracture. 11/13-status post right  arthroplasty by [name] on 11/12  Per ortho MRI did not reveal any structural issues with Lt hip.  11/15- No BM, will add fleet once today, add miralax SNF pending, bed available in am Weight bearing as tolerated to Rt leg. Pain control Per Ortho follow-up by [facility] in 2 weeks Lovenox 40 mEq subcu daily x14 days at discharge TED hose bilateral lower extremity x6 weeks Please remove Provera negative pressure dressing on 03/13/2020 and apply honeycomb dressing.  Keep dressing clean and dry at all times     Shortness of breath -Patient having increased shortness of breath for the past couple weeks of uncertain etiology -Chest x-ray showing left tracheal deviation possibly due to thyroid enlargement, small pleural effusions bilaterally, cardiomegaly with normal pulmonary vascularity 11/12-cardiology input was appreciated, patient was at acceptable risk for surgery today. No further cardiac work-up is necessary.  Several months  ago echo revealed normal ejection fraction Echo with mild to moderate elevated RH pressures, likely improved with lasix 11/14- clinically stable. 11/15- clinically stable. No complaints. Use lasix prn when clinically indicated Daily weight , I/o's    Left tracheal deviation on chest x-ray -Chest x-ray showing left tracheal deviation possibly due to enlarged thyroid -Patient endorses hoarseness and difficulty swallowing for the past month in addition to shortness of breath 11/14- thyroid US unremarkable. 11/15- f/u with pcp as outpt   Possible mild exacerbation of acute on chronic diastolic CHF (congestive heart failure) ([place]),  -Patient with a shortness of breath, mild lower extremity edema  11/15- clinically stable, euvolemic on exam Ivf was d/c'd previously Cardiology followed initially Daily weight, I/O's     COPD with chronic bronchitis ([place]) -without acute exacerbation Continue nebs/inhales Keep 02 >92%      Essential  hypertension Stable , on low side.  Continue to hold lisinopril, resume when appropriate      Benign prostatic hyperplasia with urinary frequency -New Proscar    PAD (peripheral artery disease) (HCC) -Continue aspirin    Glaucoma Continue timolol gtt    Chronic anemia - Patient follows with Dr. Rogue Bussing H&H currently stable we will continue to monitor  DVT prophylaxis: lovenox Code Status:DNR Family Communication: called left vm for daughter. Status is: Inpatient  Remains inpatient appropriate because: Unsafe discharge  Dispo: The patient is from: Home              Anticipated d/c is to: SNF              Anticipated d/c date is: 1 day              Patient currently is medically  Stable. Bed at SNF available tomorrow.       LOS: 4 days   Time spent: 35 min with >50% on coc   Nolberto Hanlon, MD [facility] Pager [phone]  If 7PM-7AM, please contact night-coverage [email or web address] Password [facility] 03/06/2020, 8:19 AM

## 2020-03-06 NOTE — Care Management Important Message (Signed)
Important Message  Patient Details  Name: Raymond Barnett MRN: [id number] Date of Birth: [birth year]   Medicare Important Message Given:  Yes     Raymond Barnett 03/06/2020, 11:54 AM

## 2020-03-07 DIAGNOSIS — Y95 Nosocomial condition: Secondary | ICD-10-CM | POA: Diagnosis present

## 2020-03-07 DIAGNOSIS — R350 Frequency of micturition: Secondary | ICD-10-CM | POA: Diagnosis not present

## 2020-03-07 DIAGNOSIS — Z96641 Presence of right artificial hip joint: Secondary | ICD-10-CM | POA: Diagnosis present

## 2020-03-07 DIAGNOSIS — M1990 Unspecified osteoarthritis, unspecified site: Secondary | ICD-10-CM | POA: Diagnosis not present

## 2020-03-07 DIAGNOSIS — M5136 Other intervertebral disc degeneration, lumbar region: Secondary | ICD-10-CM | POA: Diagnosis not present

## 2020-03-07 DIAGNOSIS — J9621 Acute and chronic respiratory failure with hypoxia: Secondary | ICD-10-CM | POA: Diagnosis present

## 2020-03-07 DIAGNOSIS — R918 Other nonspecific abnormal finding of lung field: Secondary | ICD-10-CM | POA: Diagnosis not present

## 2020-03-07 DIAGNOSIS — S72011D Unspecified intracapsular fracture of right femur, subsequent encounter for closed fracture with routine healing: Secondary | ICD-10-CM | POA: Diagnosis not present

## 2020-03-07 DIAGNOSIS — I4819 Other persistent atrial fibrillation: Secondary | ICD-10-CM | POA: Diagnosis not present

## 2020-03-07 DIAGNOSIS — N400 Enlarged prostate without lower urinary tract symptoms: Secondary | ICD-10-CM | POA: Diagnosis present

## 2020-03-07 DIAGNOSIS — R5381 Other malaise: Secondary | ICD-10-CM | POA: Diagnosis not present

## 2020-03-07 DIAGNOSIS — A419 Sepsis, unspecified organism: Secondary | ICD-10-CM | POA: Diagnosis present

## 2020-03-07 DIAGNOSIS — J309 Allergic rhinitis, unspecified: Secondary | ICD-10-CM | POA: Diagnosis not present

## 2020-03-07 DIAGNOSIS — J918 Pleural effusion in other conditions classified elsewhere: Secondary | ICD-10-CM | POA: Diagnosis present

## 2020-03-07 DIAGNOSIS — J90 Pleural effusion, not elsewhere classified: Secondary | ICD-10-CM | POA: Diagnosis not present

## 2020-03-07 DIAGNOSIS — I5043 Acute on chronic combined systolic (congestive) and diastolic (congestive) heart failure: Secondary | ICD-10-CM | POA: Diagnosis present

## 2020-03-07 DIAGNOSIS — Z7189 Other specified counseling: Secondary | ICD-10-CM | POA: Diagnosis not present

## 2020-03-07 DIAGNOSIS — Z79899 Other long term (current) drug therapy: Secondary | ICD-10-CM | POA: Diagnosis not present

## 2020-03-07 DIAGNOSIS — R0689 Other abnormalities of breathing: Secondary | ICD-10-CM | POA: Diagnosis not present

## 2020-03-07 DIAGNOSIS — M6281 Muscle weakness (generalized): Secondary | ICD-10-CM | POA: Diagnosis not present

## 2020-03-07 DIAGNOSIS — M545 Low back pain, unspecified: Secondary | ICD-10-CM | POA: Diagnosis not present

## 2020-03-07 DIAGNOSIS — R140 Abdominal distension (gaseous): Secondary | ICD-10-CM | POA: Diagnosis not present

## 2020-03-07 DIAGNOSIS — J9620 Acute and chronic respiratory failure, unspecified whether with hypoxia or hypercapnia: Secondary | ICD-10-CM | POA: Diagnosis not present

## 2020-03-07 DIAGNOSIS — I248 Other forms of acute ischemic heart disease: Secondary | ICD-10-CM | POA: Diagnosis present

## 2020-03-07 DIAGNOSIS — J438 Other emphysema: Secondary | ICD-10-CM | POA: Diagnosis not present

## 2020-03-07 DIAGNOSIS — I5032 Chronic diastolic (congestive) heart failure: Secondary | ICD-10-CM | POA: Diagnosis not present

## 2020-03-07 DIAGNOSIS — J439 Emphysema, unspecified: Secondary | ICD-10-CM | POA: Diagnosis not present

## 2020-03-07 DIAGNOSIS — I5031 Acute diastolic (congestive) heart failure: Secondary | ICD-10-CM | POA: Diagnosis not present

## 2020-03-07 DIAGNOSIS — I5023 Acute on chronic systolic (congestive) heart failure: Secondary | ICD-10-CM | POA: Diagnosis not present

## 2020-03-07 DIAGNOSIS — M25552 Pain in left hip: Secondary | ICD-10-CM | POA: Diagnosis not present

## 2020-03-07 DIAGNOSIS — S72001S Fracture of unspecified part of neck of right femur, sequela: Secondary | ICD-10-CM | POA: Diagnosis not present

## 2020-03-07 DIAGNOSIS — J449 Chronic obstructive pulmonary disease, unspecified: Secondary | ICD-10-CM | POA: Diagnosis not present

## 2020-03-07 DIAGNOSIS — Z7901 Long term (current) use of anticoagulants: Secondary | ICD-10-CM | POA: Diagnosis not present

## 2020-03-07 DIAGNOSIS — D7589 Other specified diseases of blood and blood-forming organs: Secondary | ICD-10-CM | POA: Diagnosis not present

## 2020-03-07 DIAGNOSIS — R069 Unspecified abnormalities of breathing: Secondary | ICD-10-CM | POA: Diagnosis not present

## 2020-03-07 DIAGNOSIS — Z7982 Long term (current) use of aspirin: Secondary | ICD-10-CM | POA: Diagnosis not present

## 2020-03-07 DIAGNOSIS — M160 Bilateral primary osteoarthritis of hip: Secondary | ICD-10-CM | POA: Diagnosis not present

## 2020-03-07 DIAGNOSIS — J849 Interstitial pulmonary disease, unspecified: Secondary | ICD-10-CM | POA: Diagnosis not present

## 2020-03-07 DIAGNOSIS — Z9981 Dependence on supplemental oxygen: Secondary | ICD-10-CM | POA: Diagnosis not present

## 2020-03-07 DIAGNOSIS — Z96651 Presence of right artificial knee joint: Secondary | ICD-10-CM | POA: Diagnosis present

## 2020-03-07 DIAGNOSIS — Z8781 Personal history of (healed) traumatic fracture: Secondary | ICD-10-CM | POA: Diagnosis not present

## 2020-03-07 DIAGNOSIS — I2510 Atherosclerotic heart disease of native coronary artery without angina pectoris: Secondary | ICD-10-CM | POA: Diagnosis not present

## 2020-03-07 DIAGNOSIS — I5033 Acute on chronic diastolic (congestive) heart failure: Secondary | ICD-10-CM | POA: Diagnosis not present

## 2020-03-07 DIAGNOSIS — R262 Difficulty in walking, not elsewhere classified: Secondary | ICD-10-CM | POA: Diagnosis not present

## 2020-03-07 DIAGNOSIS — I110 Hypertensive heart disease with heart failure: Secondary | ICD-10-CM | POA: Diagnosis present

## 2020-03-07 DIAGNOSIS — R279 Unspecified lack of coordination: Secondary | ICD-10-CM | POA: Diagnosis not present

## 2020-03-07 DIAGNOSIS — I4891 Unspecified atrial fibrillation: Secondary | ICD-10-CM | POA: Diagnosis not present

## 2020-03-07 DIAGNOSIS — R0602 Shortness of breath: Secondary | ICD-10-CM | POA: Diagnosis not present

## 2020-03-07 DIAGNOSIS — Z20822 Contact with and (suspected) exposure to covid-19: Secondary | ICD-10-CM | POA: Diagnosis present

## 2020-03-07 DIAGNOSIS — D6859 Other primary thrombophilia: Secondary | ICD-10-CM | POA: Diagnosis not present

## 2020-03-07 DIAGNOSIS — J441 Chronic obstructive pulmonary disease with (acute) exacerbation: Secondary | ICD-10-CM | POA: Diagnosis not present

## 2020-03-07 DIAGNOSIS — R0489 Hemorrhage from other sites in respiratory passages: Secondary | ICD-10-CM | POA: Diagnosis not present

## 2020-03-07 DIAGNOSIS — I739 Peripheral vascular disease, unspecified: Secondary | ICD-10-CM | POA: Diagnosis present

## 2020-03-07 DIAGNOSIS — I429 Cardiomyopathy, unspecified: Secondary | ICD-10-CM | POA: Diagnosis present

## 2020-03-07 DIAGNOSIS — G4700 Insomnia, unspecified: Secondary | ICD-10-CM | POA: Diagnosis not present

## 2020-03-07 DIAGNOSIS — J189 Pneumonia, unspecified organism: Secondary | ICD-10-CM | POA: Diagnosis present

## 2020-03-07 DIAGNOSIS — R000 Tachycardia, unspecified: Secondary | ICD-10-CM | POA: Diagnosis not present

## 2020-03-07 DIAGNOSIS — J8410 Pulmonary fibrosis, unspecified: Secondary | ICD-10-CM | POA: Diagnosis present

## 2020-03-07 DIAGNOSIS — M810 Age-related osteoporosis without current pathological fracture: Secondary | ICD-10-CM | POA: Diagnosis not present

## 2020-03-07 DIAGNOSIS — I480 Paroxysmal atrial fibrillation: Secondary | ICD-10-CM | POA: Diagnosis not present

## 2020-03-07 DIAGNOSIS — Z87891 Personal history of nicotine dependence: Secondary | ICD-10-CM | POA: Diagnosis not present

## 2020-03-07 DIAGNOSIS — K219 Gastro-esophageal reflux disease without esophagitis: Secondary | ICD-10-CM | POA: Diagnosis not present

## 2020-03-07 DIAGNOSIS — H409 Unspecified glaucoma: Secondary | ICD-10-CM | POA: Diagnosis not present

## 2020-03-07 DIAGNOSIS — J9601 Acute respiratory failure with hypoxia: Secondary | ICD-10-CM | POA: Diagnosis not present

## 2020-03-07 DIAGNOSIS — Z515 Encounter for palliative care: Secondary | ICD-10-CM | POA: Diagnosis not present

## 2020-03-07 DIAGNOSIS — R0902 Hypoxemia: Secondary | ICD-10-CM | POA: Diagnosis not present

## 2020-03-07 DIAGNOSIS — I10 Essential (primary) hypertension: Secondary | ICD-10-CM | POA: Diagnosis not present

## 2020-03-07 DIAGNOSIS — J398 Other specified diseases of upper respiratory tract: Secondary | ICD-10-CM | POA: Diagnosis not present

## 2020-03-07 DIAGNOSIS — G8929 Other chronic pain: Secondary | ICD-10-CM | POA: Diagnosis not present

## 2020-03-07 DIAGNOSIS — K5730 Diverticulosis of large intestine without perforation or abscess without bleeding: Secondary | ICD-10-CM | POA: Diagnosis not present

## 2020-03-07 DIAGNOSIS — N179 Acute kidney failure, unspecified: Secondary | ICD-10-CM | POA: Diagnosis not present

## 2020-03-07 DIAGNOSIS — Z66 Do not resuscitate: Secondary | ICD-10-CM | POA: Diagnosis present

## 2020-03-07 DIAGNOSIS — J9611 Chronic respiratory failure with hypoxia: Secondary | ICD-10-CM | POA: Diagnosis not present

## 2020-03-07 DIAGNOSIS — N170 Acute kidney failure with tubular necrosis: Secondary | ICD-10-CM | POA: Diagnosis not present

## 2020-03-07 DIAGNOSIS — D6869 Other thrombophilia: Secondary | ICD-10-CM | POA: Diagnosis not present

## 2020-03-07 DIAGNOSIS — N401 Enlarged prostate with lower urinary tract symptoms: Secondary | ICD-10-CM | POA: Diagnosis not present

## 2020-03-07 DIAGNOSIS — J9811 Atelectasis: Secondary | ICD-10-CM | POA: Diagnosis not present

## 2020-03-07 DIAGNOSIS — R0603 Acute respiratory distress: Secondary | ICD-10-CM | POA: Diagnosis not present

## 2020-03-07 DIAGNOSIS — D696 Thrombocytopenia, unspecified: Secondary | ICD-10-CM | POA: Diagnosis not present

## 2020-03-07 DIAGNOSIS — D649 Anemia, unspecified: Secondary | ICD-10-CM | POA: Diagnosis not present

## 2020-03-07 DIAGNOSIS — E785 Hyperlipidemia, unspecified: Secondary | ICD-10-CM | POA: Diagnosis not present

## 2020-03-07 DIAGNOSIS — R6520 Severe sepsis without septic shock: Secondary | ICD-10-CM | POA: Diagnosis present

## 2020-03-07 LAB — CBC
HCT: 25.3 % — ABNORMAL LOW (ref 39.0–52.0)
Hemoglobin: 8.3 g/dL — ABNORMAL LOW (ref 13.0–17.0)
MCH: 32 pg (ref 26.0–34.0)
MCHC: 32.8 g/dL (ref 30.0–36.0)
MCV: 97.7 fL (ref 80.0–100.0)
Platelets: 157 10*3/uL (ref 150–400)
RBC: 2.59 MIL/uL — ABNORMAL LOW (ref 4.22–5.81)
RDW: 21.5 % — ABNORMAL HIGH (ref 11.5–15.5)
WBC: 10.1 10*3/uL (ref 4.0–10.5)
nRBC: 1 % — ABNORMAL HIGH (ref 0.0–0.2)

## 2020-03-07 LAB — SURGICAL PATHOLOGY

## 2020-03-07 MED ORDER — TRAZODONE HCL 100 MG PO TABS
100.0000 mg | ORAL_TABLET | Freq: Every day | ORAL | Status: DC
  Filled 2020-03-07: qty 1

## 2020-03-07 MED ORDER — ACETAMINOPHEN 325 MG PO TABS
325.0000 mg | ORAL_TABLET | Freq: Four times a day (QID) | ORAL | Status: AC | PRN
Start: 2020-03-07 — End: ?

## 2020-03-07 MED ORDER — PANTOPRAZOLE SODIUM 40 MG PO TBEC: 40.0000 mg | DELAYED_RELEASE_TABLET | Freq: Every day | ORAL | Status: AC

## 2020-03-07 MED ORDER — OYSTER SHELL CALCIUM/D 500-5 MG-MCG PO TABS: 1.0000 | ORAL_TABLET | Freq: Every day | ORAL | Status: AC

## 2020-03-07 MED ORDER — METHOCARBAMOL 500 MG PO TABS: 500.0000 mg | ORAL_TABLET | Freq: Four times a day (QID) | ORAL | Status: AC | PRN

## 2020-03-07 MED ORDER — ALUM & MAG HYDROXIDE-SIMETH 200-200-20 MG/5ML PO SUSP: 30.0000 mL | ORAL | 0 refills | Status: AC | PRN

## 2020-03-07 MED ORDER — TRAZODONE HCL 50 MG PO TABS: 50.0000 mg | ORAL_TABLET | Freq: Every day | ORAL | Status: DC

## 2020-03-07 MED ORDER — MAGNESIUM CITRATE PO SOLN: 1.0000 | Freq: Once | ORAL | Status: AC | PRN

## 2020-03-07 MED ORDER — DOCUSATE SODIUM 100 MG PO CAPS: 100.0000 mg | ORAL_CAPSULE | Freq: Two times a day (BID) | ORAL | 0 refills | Status: AC

## 2020-03-07 MED ORDER — FLEET ENEMA 7-19 GM/118ML RE ENEM
1.0000 | ENEMA | Freq: Once | RECTAL | Status: AC
  Administered 2020-03-07: 1 via RECTAL

## 2020-03-07 MED ORDER — BISACODYL 10 MG RE SUPP: 10.0000 mg | Freq: Every day | RECTAL | 0 refills | Status: AC | PRN

## 2020-03-07 MED ORDER — TRAZODONE HCL 50 MG PO TABS
50.0000 mg | ORAL_TABLET | Freq: Once | ORAL | Status: AC
  Administered 2020-03-07: 50 mg via ORAL
  Filled 2020-03-07: qty 1

## 2020-03-07 MED ORDER — POLYETHYLENE GLYCOL 3350 17 G PO PACK: 17.0000 g | PACK | Freq: Every day | ORAL | 0 refills | Status: AC

## 2020-03-07 NOTE — Progress Notes (Signed)
  Subjective: 4 Days Post-Op Procedure(s) (LRB): ARTHROPLASTY BIPOLAR HIP (HEMIARTHROPLASTY) (Right) Patient reports pain as well-controlled Plan is to go Rehab after hospital stay.  Slow progress of physical therapy Negative for chest pain and shortness of breath Fever: no Gastrointestinal: negative for nausea and vomiting.   Objective: Vital signs in last 24 hours: Temp:  [97.5 F (36.4 C)-99.1 F (37.3 C)] 98.9 F (37.2 C) (11/16 0728) Pulse Rate:  [78-87] 86 (11/16 0728) Resp:  [17-18] 17 (11/16 0728) BP: (114-153)/(62-85) 126/67 (11/16 0728) SpO2:  [94 %-100 %] 100 % (11/16 0728)  Intake/Output from previous day:  Intake/Output Summary (Last 24 hours) at 03/07/2020 1012 Last data filed at 03/07/2020 0958 Gross per 24 hour  Intake 360 ml  Output 450 ml  Net -90 ml    Intake/Output this shift: Total I/O In: 360 [P.O.:360] Out: -   Labs: Recent Labs    03/05/20 0543 03/07/20 0405  HGB 8.8* 8.3*   Recent Labs    03/05/20 0543 03/07/20 0405  WBC 8.8 10.1  RBC 2.74* 2.59*  HCT 26.6* 25.3*  PLT 119* 157   No results for input(s): NA, K, CL, CO2, BUN, CREATININE, GLUCOSE, CALCIUM in the last 72 hours. No results for input(s): LABPT, INR in the last 72 hours.   EXAM General - Patient is Alert, Appropriate and Oriented Extremity - Compartment soft, unable to dorsiflex ankle (pre-existing), sensation absent over the superficial and deep fibular distributions (pre-existing), intact over lateral sural cutaneous and saphenous Dressing/Incision -Prevena in place and working, seal is maintained, no drainage noted    Assessment/Plan: 4 Days Post-Op Procedure(s) (LRB): ARTHROPLASTY BIPOLAR HIP (HEMIARTHROPLASTY) (Right) Principal Problem:   Fracture of femoral neck, right, closed ([place]) Active Problems:   Essential hypertension   Chronic bilateral low back pain without sciatica   Benign prostatic hyperplasia with urinary frequency   PAD (peripheral artery  disease) (HCC)   Chronic diastolic CHF (congestive heart failure) (HCC), EF 55-60% 10/2019   Ambulatory dysfunction   COPD with chronic bronchitis (HCC)   Glaucoma   Chronic anemia   Tracheal deviation  Estimated body mass index is 32.39 kg/m as calculated from the following:   Height as of this encounter: 5\' 10"  (1.778 m).   Weight as of this encounter: 102.4 kg. Advance diet Up with therapy Pain well controlled Labs and vital signs are stable Care manager to assist with discharge to skilled nursing facility  Follow-up with [facility] orthopedics in 2 weeks Lovenox 40 mg subcu daily x14 days at discharge TED hose bilateral lower extremity x6 weeks Please remove provena negative pressure dressing on 03/13/2020 and apply honey comb dressing. Keep dressing clean and dry at all times.      DVT Prophylaxis - Lovenox, Ted hose and SCDs Weight-Bearing as tolerated to right leg  T. Rachelle Hora PA-C [facility] Orthopaedic Surgery 03/07/2020, 10:12 AM

## 2020-03-07 NOTE — Progress Notes (Signed)
Writer back in to see if patient would allow Probation officer to administer the fleet and after speaking with his daughter agreed. But stated no milk of Mg.

## 2020-03-07 NOTE — TOC Transition Note (Signed)
Transition of Care [facility]) - CM/SW Discharge Note   Patient Details  Name: Raymond Barnett MRN: [id number] Date of Birth: [birth year]  [facility] CM/SW Contact:  Magnus Ivan, LCSW Phone Number: 03/07/2020, 11:29 AM   Clinical Narrative:   Patient to discharge to [facility] today. CSW updated patient. 12:30 pick up time arranged with [facility] transport. Medical Necessity, DNR, and Face Sheet placed in Discharge Packet. RN already called report. No additional needs identified.    Final next level of care: [place]     Patient Goals and CMS Choice Patient states their goals for this hospitalization and ongoing recovery are:: SNF rehab CMS Medicare.gov Compare Post Acute Care list provided to:: Patient Choice offered to / list presented to : Patient  Discharge Placement              Patient chooses bed at: [facility] Patient to be transferred to facility by: EMS Name of family member notified: patient notified Patient and family notified of of transfer: 03/07/20  Discharge Plan and Services                                     Social Determinants of Health (SDOH) Interventions     Readmission Risk Interventions No flowsheet data found.

## 2020-03-07 NOTE — Progress Notes (Signed)
Physical Therapy Treatment Patient Details Name: Raymond Barnett MRN: [id number] DOB: Feb 16, 1930 Today's Date: 03/07/2020    History of Present Illness Pt is an 86 y/o M admitted on 03/02/20 with c/o SOB, generalized weakness, Gradually progressively worsening  L hip pain beginning 3-4 weeks ago. Imaging revealed small B pleural effusions, MRI reveals acute impacted subcapital R femoral neck fx. Pt underwent R hip hemiarthroplasty ([name]) on 03/03/20. PMH: COPD, HLD, HTN, HFpEF, arthritis, B cataracts, lumbar DDD, glaucoma, h/o CTS, hx of osteomyelitis in RLE, moderate MR, PAD    PT Comments    Pt did relatively well with the exercises and showed good effort with bed mobility (though he did ultimately need considerable assist).  He is showing increased LE strength, but can not do SLRs w/o assist.  Pt consistently c/o more pain in L hip than R though the session and ultimately this is what limited him the most today.   Follow Up Recommendations  SNF;Supervision for mobility/OOB     Equipment Recommendations  Rolling walker with 5" wheels    Recommendations for Other Services       Precautions / Restrictions Precautions Precautions: Fall Precaution Comments: anterior hip (no precautions) Restrictions Weight Bearing Restrictions: No RLE Weight Bearing: Weight bearing as tolerated    Mobility  Bed Mobility Overal bed mobility: Needs Assistance Bed Mobility: Supine to Sit;Sit to Supine     Supine to sit: Min guard;HOB elevated Sit to supine: Min assist   General bed mobility comments: Pt unable to get to sitting w/o direct assist, once sitting upright he started having increased L hip pain (contralateral to surgical site) that ultimately kept him from being able to even attempt standing with assist  Transfers   Equipment used: Rolling walker (2 wheeled)             General transfer comment: at EOB with some initial lightheadedness that quickly subsided, however he  started having increased L hip and and ultimately could not tolerate more than ~4 minutes of sitting only half efforts with supported standing 2/2 pain  Ambulation/Gait                 Stairs             Wheelchair Mobility    Modified Rankin (Stroke Patients Only)       Balance Overall balance assessment: Needs assistance Sitting-balance support: Feet supported Sitting balance-Leahy Scale: Good Sitting balance - Comments: pt able to sit EOB ~5 minutes without assist & without LOB noted       Standing balance comment: uanble to trial standing this date secondary to L hip pain                            Cognition Arousal/Alertness: Awake/alert Behavior During Therapy: WFL for tasks assessed/performed Overall Cognitive Status: Within Functional Limits for tasks assessed                                        Exercises Total Joint Exercises Ankle Circles/Pumps: Strengthening;10 reps Gluteal Sets: Strengthening;15 reps Short Arc Quad: Strengthening;15 reps Heel Slides: Strengthening;AROM;10 reps Hip ABduction/ADduction: Strengthening;15 reps Straight Leg Raises: AAROM;10 reps    General Comments        Pertinent Vitals/Pain Pain Assessment: 0-10 Pain Score: 8  Pain Location: L hip Pain Descriptors / Indicators: Aching  Home Living                      Prior Function            PT Goals (current goals can now be found in the care plan section) Progress towards PT goals: Progressing toward goals    Frequency    BID      PT Plan Current plan remains appropriate    Co-evaluation              AM-PAC PT "6 Clicks" Mobility   Outcome Measure  Help needed turning from your back to your side while in a flat bed without using bedrails?: A Little Help needed moving from lying on your back to sitting on the side of a flat bed without using bedrails?: A Little Help needed moving to and from a bed to a  chair (including a wheelchair)?: A Little Help needed standing up from a chair using your arms (e.g., wheelchair or bedside chair)?: A Little Help needed to walk in hospital room?: A Lot Help needed climbing 3-5 steps with a railing? : A Lot 6 Click Score: 16    End of Session Equipment Utilized During Treatment: Oxygen;Gait belt Activity Tolerance: Patient tolerated treatment well Patient left: with bed alarm set;with SCD's reapplied;with call bell/phone within reach Nurse Communication: Mobility status PT Visit Diagnosis: Muscle weakness (generalized) (M62.81);Difficulty in walking, not elsewhere classified (R26.2);Pain Pain - Right/Left: Left Pain - part of body: Hip     Time: 5248-1859 PT Time Calculation (min) (ACUTE ONLY): 40 min  Charges:  $Therapeutic Exercise: 23-37 mins $Therapeutic Activity: 8-22 mins                     Kreg Shropshire, DPT 03/07/2020, 11:08 AM

## 2020-03-07 NOTE — Discharge Summary (Signed)
Raymond Barnett IRS:[id number] DOB: 05-10-1929 DOA: 03/02/2020  PCP: [name], DO  Admit date: 03/02/2020 Discharge date: 03/07/2020  Admitted From: home Disposition:  SNF  Recommendations for Outpatient Follow-up:  1. Follow up with PCP in 1 week 2. Please obtain BMP/CBC in one week 3. Orthopedics at [facility] in 2 weeks     Discharge Condition:Stable CODE STATUS: DNR Diet recommendation: Heart Healthy  Brief/Interim Summary: Raymond Armistead Pizzutois a 86 y.o.malewith medical history significant forCOPD, chronic diastolic heart failure, chronic anemia, HLD, HTN, chronic osteoarthritis and low back pain, BPH and glaucoma, who at baseline has limited ambulation, using motorized wheelchair prescribed by his provider for the past 2 months was brought into the emergency room at the recommendation of his PCP due to a complaint of persistent andlefthip pain which has acutely worsened his ability to ambulate.left hip x-raybut then went on to haveCT pelvis followed by MRI pelvis that showed right impacted subcapital femoral neck fracture.   Fracture of femoral neck, right, closed ([place]) Chronic bilateral low back pain without sciaticaand osteoarthritis Ambulatory dysfunction, acute on chronic -MRI as requested by [name] showing right femoral neck fracture. -status post right arthroplasty by [name] on 11/12  Per ortho MRI did not reveal any structural issues with Lt hip.  Weight bearing as tolerated to Rt leg.  Bowel regimen Follow-up with [facility] orthopedics in 2 weeks Lovenox 40 mg subcu daily x14 days at discharge TED hose bilateral lower extremity x6 weeks Please remove provena negative pressure dressing on 03/13/2020 and apply honey comb dressing. Keep dressing clean and dry at all times.     Shortness of breath -Patient having increased shortness of breath for the past couple weeks of uncertain etiology -Chest x-ray showing left tracheal deviation  possibly due to thyroid enlargement, small pleural effusions bilaterally, cardiomegaly with normal pulmonary vascularity Cardiology reported patient being at acceptable risk for surgery  No further cardiac work-up is necessary.  Several months ago echo revealed normal ejection fraction, also  with mild to moderate elevated RH pressures, likely improved with lasix Lasix can be resumed . Monitor electrolytes as outpatient.    Left tracheal deviation on chest x-ray -Chest x-ray showing left tracheal deviation possibly due to enlarged thyroid -Patient endorses hoarseness and difficulty swallowing for the past month in addition to shortness of breath  thyroid US unremarkable. Follow up with pcp for further management   Possible mild exacerbation of acute on chronic diastolic CHF (congestive heart failure) ([place]), -Patient with a shortness of breath, mild lower extremity edema  Improved with lasix prn Uses 02 at night, but should also use at daytime to keep 02 sat >92% Lasix daily, monitor electrolytes F/u with cardiology in one week Check daily weight and I's and O's     COPD with chronic bronchitis ([place]) -without acute exacerbation continue nebs/inhalers keep 02 Sat >92%    Essential hypertension Stable. Was on low side, so lisinopril was held.  As bp increases , can resume lisinopril, monitor renal function    Benign prostatic hyperplasia with urinary frequency Continue proscar  PAD (peripheral artery disease) (HCC) -Continue aspirin  Glaucoma -Continue timolol drops  Chronic anemia -Patient follows with Dr. Rogue Bussing H&H currently stable  Monitor as outpatient    Discharge Diagnoses:  Principal Problem:   Fracture of femoral neck, right, closed ([place]) Active Problems:   Essential hypertension   Chronic bilateral low back pain without sciatica   Benign prostatic hyperplasia with urinary frequency   PAD (peripheral  artery  disease) ([place])   Chronic diastolic CHF (congestive heart failure) ([place]), EF 55-60% 10/2019   Ambulatory dysfunction   COPD with chronic bronchitis (HCC)   Glaucoma   Chronic anemia   Tracheal deviation    Discharge Instructions  Discharge Instructions    Call MD for:  temperature >100.4   Complete by: As directed    Diet - low sodium heart healthy   Complete by: As directed    Discharge wound care:   Complete by: As directed    Please remove provena negative pressure dressing on 03/13/2020 and apply honey comb dressing. Keep dressing clean and dry at all times.   Increase activity slowly   Complete by: As directed      Allergies as of 03/07/2020   No Known Allergies     Medication List    STOP taking these medications   Calcium 600 + D 600-200 MG-UNIT Tabs Generic drug: Calcium Carb-Cholecalciferol Replaced by: [place] Calcium/D 500-5 MG-MCG Tabs   lisinopril 10 MG tablet Commonly known as: ZESTRIL   loratadine 10 MG tablet Commonly known as: CLARITIN   naproxen 500 MG tablet Commonly known as: NAPROSYN     TAKE these medications   acetaminophen 325 MG tablet Commonly known as: TYLENOL Take 1-2 tablets (325-650 mg total) by mouth every 6 (six) hours as needed for mild pain (pain score 1-3 or temp > 100.5).   alum & mag hydroxide-simeth 200-200-20 MG/5ML suspension Commonly known as: MAALOX/MYLANTA Take 30 mLs by mouth every 4 (four) hours as needed for indigestion.   Aspirin Low Dose 81 MG chewable tablet Generic drug: aspirin Chew 81 mg by mouth daily.   bisacodyl 10 MG suppository Commonly known as: DULCOLAX Place 1 suppository (10 mg total) rectally daily as needed for moderate constipation.   cholecalciferol 25 MCG (1000 UNIT) tablet Commonly known as: VITAMIN D Take 1,000 Units by mouth daily.   docusate sodium 100 MG capsule Commonly known as: COLACE Take 1 capsule (100 mg total) by mouth 2 (two) times daily.   enoxaparin 40 MG/0.4ML  injection Commonly known as: LOVENOX Inject 0.4 mLs (40 mg total) into the skin daily for 14 days.   finasteride 5 MG tablet Commonly known as: PROSCAR Take 5 mg by mouth daily.   fluticasone 50 MCG/ACT nasal spray Commonly known as: FLONASE Place 2 sprays into both nostrils daily. Use for 4-6 weeks then stop and use seasonally or as needed. What changed: when to take this   furosemide 20 MG tablet Commonly known as: LASIX Take 1 tablet (20 mg total) by mouth daily.   HYDROcodone-acetaminophen 5-325 MG tablet Commonly known as: NORCO/VICODIN Take 1-2 tablets by mouth every 4 (four) hours as needed for moderate pain (pain score 4-6).   Lidocaine-Hydrocortisone Ace 2-2 % Kit Use rectal suppository twice daily as needed for up to 1 week for hemorrhoids   Lubricating Plus Eye Drops 0.5 % Soln Generic drug: Carboxymethylcellulose Sod PF Apply 1 application to eye as needed (dry eyes).   magnesium citrate Soln Take 296 mLs (1 Bottle total) by mouth once as needed for severe constipation.   methocarbamol 500 MG tablet Commonly known as: ROBAXIN Take 1 tablet (500 mg total) by mouth every 6 (six) hours as needed for muscle spasms.   OXYGEN Inhale 2 L into the lungs at bedtime.   Oyster Shell Calcium/D 500-5 MG-MCG Tabs Take 1 tablet by mouth daily. Replaces: Calcium 600 + D 600-200 MG-UNIT Tabs   pantoprazole 40  MG tablet Commonly known as: PROTONIX Take 1 tablet (40 mg total) by mouth daily.   polyethylene glycol 17 g packet Commonly known as: MIRALAX / GLYCOLAX Take 17 g by mouth daily.   timolol 0.5 % ophthalmic solution Commonly known as: TIMOPTIC Place 1 drop into both eyes 2 (two) times daily.   traZODone 100 MG tablet Commonly known as: DESYREL Take 1 tablet (100 mg total) by mouth at bedtime.            Durable Medical Equipment  (From admission, onward)         Start     Ordered   03/03/20 1535  DME Walker rolling  Once       Question Answer  Comment  Walker: With [place] Wheels   Patient needs a walker to treat with the following condition S/P hip hemiarthroplasty      03/03/20 1534   03/03/20 1535  DME 3 n 1  Once        03/03/20 1534   03/03/20 1535  DME Bedside commode  Once       Question:  Patient needs a bedside commode to treat with the following condition  Answer:  S/P hip hemiarthroplasty   03/03/20 1534           Discharge Care Instructions  (From admission, onward)         Start     Ordered   03/07/20 0000  Discharge wound care:       Comments: Please remove provena negative pressure dressing on 03/13/2020 and apply honey comb dressing. Keep dressing clean and dry at all times.   03/07/20 1002          Follow-up Information    Duanne Guess, PA-C Follow up in 2 week(s).   Specialties: Orthopedic Surgery, Emergency Medicine Contact information: [place] [address] [phone]        [name], DO Follow up in 1 week(s).   Specialty: Family Medicine Why: needs blood work too Contact information: [place] [address] [phone]        Nelva Bush, MD Follow up in 1 week(s).   Specialty: Cardiology Contact information: [place] [address] [phone]              No Known Allergies  Consultations:  Orthopedics  cardiology   Procedures/Studies: DG Chest 2 View  Result Date: 03/02/2020 CLINICAL DATA:  Shortness of breath EXAM: CHEST - 2 VIEW COMPARISON:  October 12, 2019 FINDINGS: There is a small pleural effusion on each side with mild bibasilar atelectasis. The lungs elsewhere are clear. There is cardiomegaly with pulmonary vascularity normal. No adenopathy. There is aortic atherosclerosis. There is leftward deviation of the upper thoracic trachea. Bones appear osteoporotic. IMPRESSION: 1. Small pleural effusions bilaterally with bibasilar atelectasis. Lungs elsewhere clear. 2.   Cardiomegaly with pulmonary vascularity normal. 3. Deviation of the upper thoracic trachea toward the left. Question thyroid enlargement as etiology for this finding. 4.  Aortic Atherosclerosis (ICD10-I70.0). Electronically Signed   By: Lowella Grip III M.D.   On: 03/02/2020 13:03   CT PELVIS WO CONTRAST  Result Date: 03/02/2020 CLINICAL DATA:  Hip pain for 10 days EXAM: CT PELVIS WITHOUT CONTRAST TECHNIQUE: Multidetector CT imaging of the pelvis was performed following the standard protocol without intravenous contrast. COMPARISON:  Radiograph 03/02/2020 FINDINGS: Urinary Tract:  Urinary bladder is unremarkable. Bowel: Sigmoid colon diverticula without acute  inflammatory process. Negative appendix. No bowel wall thickening Vascular/Lymphatic: Extensive aortic atherosclerosis without aneurysm. No suspicious nodes Reproductive:  Mild prostate calcification without enlargement Other:  Negative for pelvic effusion Musculoskeletal: Mild degenerative changes of both hips with joint space narrowing and asymmetric femoral head neck junction. Chronic appearing deformity at the left iliac crest potentially due to remote trauma or postsurgical change. There is no fracture or associated soft tissue mass. There is fatty atrophy of adjacent gluteus musculature. No femoral head dislocation. Coronal images are suspicious for acute appearing nondisplaced right femoral neck fracture. Pubic symphysis and rami appear intact. IMPRESSION: 1. No definite acute osseous abnormality of left hip. Chronic appearing osseous deformity of left iliac crest without evidence for bony destructive change or associated soft tissue mass. 2. Findings are suspicious for an acute nondisplaced right femoral neck fracture 3. Sigmoid colon diverticular disease without acute inflammatory change. Aortic Atherosclerosis (ICD10-I70.0). Electronically Signed   By: Donavan Foil M.D.   On: 03/02/2020 16:33   MR PELVIS WO CONTRAST  Result Date:  03/02/2020 CLINICAL DATA:  Right hip pain EXAM: MRI PELVIS WITHOUT CONTRAST TECHNIQUE: Multiplanar multisequence MR imaging of the pelvis was performed. No intravenous contrast was administered. COMPARISON:  CT 3:54 p.m., plain radiographs 12:36 p.m. FINDINGS: Urinary Tract: The distal ureters are decompressed. The bladder is unremarkable. Bowel: The visualized large and small bowel are unremarkable per save for severe sigmoid diverticulosis. Vascular/Lymphatic: No pathologic adenopathy within the abdomen and pelvis Reproductive:  No mass or other significant abnormality Other:  None significant Musculoskeletal: There is an acute, impacted subcapital right femoral neck fracture with mild varus angulation of the distal fracture fragment. The femoral head is still seated within the right acetabulum. Iliofemoral in pubic femoral ligaments are intact. Ligamentum teres is intact. There is mild bilateral degenerative hip arthritis. There is injury signal within the right gluteal musculature compatible with mild strain. Similar changes are noted within the left gluteus maximus muscle. There is increased signal compatible with greater trochanteric bursitis on the right. There is increased signal within the visualized proximal vastus lateralis and intermedius compatible with muscle strain. No frank avulsion. There is irregularity of the left iliac crest without associated marrow edema or soft tissue mass most in keeping with mod trauma or surgical intervention. IMPRESSION: Acute, impacted, mildly angulated right subcapital femoral neck fracture Increased signal in keeping with muscular strain involving the a right gluteal musculature as well as the vastus lateralis and intermedius. No frank tendinous avulsion. Mild superimposed bilateral hip degenerative arthritis. Electronically Signed   By: Fidela Salisbury MD   On: 03/02/2020 22:36   US THYROID  Result Date: 03/04/2020 CLINICAL DATA:  Palpable abnormality.  Enlarged  thyroid gland. EXAM: THYROID ULTRASOUND TECHNIQUE: Ultrasound examination of the thyroid gland and adjacent soft tissues was performed. COMPARISON:  None. FINDINGS: Parenchymal Echotexture: Normal Isthmus: 0.3 cm Right lobe: 4.3 x 2.2 x 1.9 cm Left lobe: 4.4 x 1.9 x 1.7 cm _________________________________________________________ Estimated total number of nodules >/= 1 cm: 0 Number of spongiform nodules >/=  2 cm not described below (TR1): 0 Number of mixed cystic and solid nodules >/= 1.5 cm not described below (TR2): 0 _________________________________________________________ No discrete nodules are seen within the thyroid gland. No abnormal lymph nodes identified. IMPRESSION: Normal thyroid ultrasound. The thyroid gland is not enlarged and no focal nodules are identified. The above is in keeping with the ACR TI-RADS recommendations - J Am Coll Radiol 2017;14:587-595. Electronically Signed   By: Jenness Corner.D.  On: 03/04/2020 13:54   DG HIP OPERATIVE UNILAT W OR W/O PELVIS RIGHT  Result Date: 03/03/2020 CLINICAL DATA:  Total hip replacement EXAM: OPERATIVE RIGHT HIP   1 VIEW TECHNIQUE: Fluoroscopic spot image(s) were submitted for interpretation post-operatively. COMPARISON:  Pelvis radiograph March 02, 2020; CT and MR pelvis March 02, 2020 FLUOROSCOPY TIME:  0 minutes 6 seconds; 2 acquired images FINDINGS: Initial image demonstrates subcapital femoral neck fracture on the right. Subsequent image shows a total hip replacement right with visualized prosthetic components well-seated on frontal view. No fracture or dislocation evident post prosthetic placement. IMPRESSION: Total hip replacement on the right with visualized prosthetic components well-seated on frontal view. No fracture or dislocation evident on image demonstrating prosthesis placement. Electronically Signed   By: Lowella Grip III M.D.   On: 03/03/2020 13:53   DG Hip Unilat With Pelvis 2-3 Views Left  Result Date:  03/02/2020 CLINICAL DATA:  Pain EXAM: DG HIP (WITH OR WITHOUT PELVIS) 2-3V LEFT COMPARISON:  None. FINDINGS: Frontal pelvis as well as frontal and lateral left hip images were obtained. No fracture or dislocation. There is mild symmetric narrowing of each hip joint. There is moderate osteoarthritic change in the right sacroiliac joint. There is also degenerative change in the lower lumbar spine. No erosions. There is bony overgrowth along the left lateral iliac crest. IMPRESSION: Mild symmetric narrowing each hip joint. 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 Bony overgrowth along the lateral left iliac crest measuring 8.8 x 3.3 cm. Question prior trauma in this area. Neoplastic etiology for this somewhat unusual appearance must be questioned. Nonemergent CT of the pelvis to further evaluate this region is felt to be warranted. No acute fracture or dislocation. Electronically Signed   By: Lowella Grip III M.D.   On: 03/02/2020 13:07   DG HIP UNILAT W OR W/O PELVIS 2-3 VIEWS RIGHT  Result Date: 03/03/2020 CLINICAL DATA:  Post RIGHT total hip arthroplasty EXAM: DG HIP (WITH OR WITHOUT PELVIS) 2-3V RIGHT COMPARISON:  03/02/2020 FINDINGS: RIGHT hip prosthesis newly identified. No acute fracture or dislocation. Bones demineralized. Overlying skin clips and postsurgical changes of soft tissues. IMPRESSION: RIGHT hip prosthesis without acute complication. Electronically Signed   By: Lavonia Dana M.D.   On: 03/03/2020 14:13      Subjective: No complaints this am  Discharge Exam: Vitals:   03/07/20 0418 03/07/20 0728  BP: 121/74 126/67  Pulse: 83 86  Resp:  17  Temp: 98 F (36.7 C) 98.9 F (37.2 C)  SpO2: 100% 100%   Vitals:   03/06/20 2008 03/07/20 0000 03/07/20 0418 03/07/20 0728  BP: 128/65 (!) 153/85 121/74 126/67  Pulse: 86 79 83 86  Resp: _0 Temp: 99.1 F (37.3 C) 97.9 F (36.6 C) 98 F (36.7 C) 98.9 F (37.2 C)  TempSrc: Oral Oral Oral Oral  SpO2:  99% 94% 100% 100%  Weight:      Height:        General: Pt is alert, awake, not in acute distress Cardiovascular: RRR, S1/S2 +, no rubs, no gallops Respiratory: CTA bilaterally, no wheezing, no rhonchi Abdominal: Soft, NT, ND, bowel sounds + Extremities: no edema, no cyanosis    The results of significant diagnostics from this hospitalization (including imaging, microbiology, ancillary and laboratory) are listed below for reference.     Microbiology: Recent Results (from the past 240 hour(s))  Respiratory Panel by RT PCR (Flu A&B, Covid) - Nasopharyngeal Swab     Status: None  Collection Time: 03/03/20 12:23 AM   Specimen: Nasopharyngeal Swab  Result Value Ref Range Status   SARS Coronavirus 2 by RT PCR NEGATIVE NEGATIVE Final    Comment: (NOTE) SARS-CoV-2 target nucleic acids are NOT DETECTED.  The SARS-CoV-2 RNA is generally detectable in upper respiratoy specimens during the acute phase of infection. The lowest concentration of SARS-CoV-2 viral copies this assay can detect is 131 copies/mL. A negative result does not preclude SARS-Cov-2 infection and should not be used as the sole basis for treatment or other patient management decisions. A negative result may occur with  improper specimen collection/handling, submission of specimen other than nasopharyngeal swab, presence of viral mutation(s) within the areas targeted by this assay, and inadequate number of viral copies (<131 copies/mL). A negative result must be combined with clinical observations, patient history, and epidemiological information. The expected result is Negative.  Fact Sheet for Patients:  PinkCheek.be  Fact Sheet for Healthcare Providers:  GravelBags.it  This test is no t yet approved or cleared by the [place] FDA and  has been authorized for detection and/or diagnosis of SARS-CoV-2 by FDA under an Emergency Use Authorization (EUA).  This EUA will remain  in effect (meaning this test can be used) for the duration of the COVID-19 declaration under Section 564(b)(1) of the Act, 21 U.S.C. section 360bbb-3(b)(1), unless the authorization is terminated or revoked sooner.     Influenza A by PCR NEGATIVE NEGATIVE Final   Influenza B by PCR NEGATIVE NEGATIVE Final    Comment: (NOTE) The Xpert Xpress SARS-CoV-2/FLU/RSV assay is intended as an aid in  the diagnosis of influenza from Nasopharyngeal swab specimens and  should not be used as a sole basis for treatment. Nasal washings and  aspirates are unacceptable for Xpert Xpress SARS-CoV-2/FLU/RSV  testing.  Fact Sheet for Patients: PinkCheek.be  Fact Sheet for Healthcare Providers: GravelBags.it  This test is not yet approved or cleared by the [place] FDA and  has been authorized for detection and/or diagnosis of SARS-CoV-2 by  FDA under an Emergency Use Authorization (EUA). This EUA will remain  in effect (meaning this test can be used) for the duration of the  Covid-19 declaration under Section 564(b)(1) of the Act, 21  U.S.C. section 360bbb-3(b)(1), unless the authorization is  terminated or revoked. Performed at [facility], [address] [place], [place] [address]   Surgical PCR screen     Status: None   Collection Time: 03/03/20  1:55 AM   Specimen: Nasal Mucosa; Nasal Swab  Result Value Ref Range Status   MRSA, PCR NEGATIVE NEGATIVE Final   Staphylococcus aureus NEGATIVE NEGATIVE Final    Comment: (NOTE) The Xpert SA Assay (FDA approved for NASAL specimens in patients 46 years of age and older), is one component of a comprehensive surveillance program. It is not intended to diagnose infection nor to guide or monitor treatment. Performed at [facility], [address] [place], [place] [address]      Labs: BNP (last 3 results) Recent Labs    03/03/20 0020  03/04/20 0400  BNP 540.9* 920.1*   Basic Metabolic Panel: Recent Labs  Lab 03/02/20 1228 03/03/20 0025 03/04/20 0400  NA 132* 130* 132*  K 4.4 4.2 4.5  CL 96* 96* 99  CO2 _0 GLUCOSE 104* 129* 105*  BUN _1 CREATININE 0.84 0.78 0.92  CALCIUM 8.8* 8.5* 8.3*   Liver Function Tests: No results for input(s): AST, ALT, ALKPHOS, BILITOT, PROT,  ALBUMIN in the last 168 hours. No results for input(s): LIPASE, AMYLASE in the last 168 hours. No results for input(s): AMMONIA in the last 168 hours. CBC: Recent Labs  Lab 03/02/20 1228 03/03/20 0025 03/04/20 0400 03/05/20 0543 03/07/20 0405  WBC 9.9 10.4 11.0* 8.8 10.1  HGB 11.6* 10.9* 8.9* 8.8* 8.3*  HCT 35.5* 33.1* 27.1* 26.6* 25.3*  MCV 97.8 97.9 98.5 97.1 97.7  PLT 158 123* 123* 119* 157   Cardiac Enzymes: No results for input(s): CKTOTAL, CKMB, CKMBINDEX, TROPONINI in the last 168 hours. BNP: Invalid input(s): POCBNP CBG: No results for input(s): GLUCAP in the last 168 hours. D-Dimer No results for input(s): DDIMER in the last 72 hours. Hgb A1c No results for input(s): HGBA1C in the last 72 hours. Lipid Profile No results for input(s): CHOL, HDL, LDLCALC, TRIG, CHOLHDL, LDLDIRECT in the last 72 hours. Thyroid function studies No results for input(s): TSH, T4TOTAL, T3FREE, THYROIDAB in the last 72 hours.  Invalid input(s): FREET3 Anemia work up No results for input(s): VITAMINB12, FOLATE, FERRITIN, TIBC, IRON, RETICCTPCT in the last 72 hours. Urinalysis    Component Value Date/Time   COLORURINE YELLOW (A) 03/02/2020 1228   APPEARANCEUR CLEAR (A) 03/02/2020 1228   APPEARANCEUR Clear 12/12/2011 1211   LABSPEC 1.008 03/02/2020 1228   LABSPEC 1.006 12/12/2011 1211   PHURINE 6.0 03/02/2020 1228   GLUCOSEU NEGATIVE 03/02/2020 1228   GLUCOSEU Negative 12/12/2011 1211   HGBUR NEGATIVE 03/02/2020 1228   BILIRUBINUR NEGATIVE 03/02/2020 1228   BILIRUBINUR negative 09/16/2017 1219   BILIRUBINUR Negative  12/12/2011 1211   KETONESUR NEGATIVE 03/02/2020 1228   PROTEINUR NEGATIVE 03/02/2020 1228   UROBILINOGEN 0.2 09/16/2017 1219   NITRITE NEGATIVE 03/02/2020 1228   LEUKOCYTESUR NEGATIVE 03/02/2020 1228   LEUKOCYTESUR Negative 12/12/2011 1211   Sepsis Labs Invalid input(s): PROCALCITONIN,  WBC,  LACTICIDVEN Microbiology Recent Results (from the past 240 hour(s))  Respiratory Panel by RT PCR (Flu A&B, Covid) - Nasopharyngeal Swab     Status: None   Collection Time: 03/03/20 12:23 AM   Specimen: Nasopharyngeal Swab  Result Value Ref Range Status   SARS Coronavirus 2 by RT PCR NEGATIVE NEGATIVE Final    Comment: (NOTE) SARS-CoV-2 target nucleic acids are NOT DETECTED.  The SARS-CoV-2 RNA is generally detectable in upper respiratoy specimens during the acute phase of infection. The lowest concentration of SARS-CoV-2 viral copies this assay can detect is 131 copies/mL. A negative result does not preclude SARS-Cov-2 infection and should not be used as the sole basis for treatment or other patient management decisions. A negative result may occur with  improper specimen collection/handling, submission of specimen other than nasopharyngeal swab, presence of viral mutation(s) within the areas targeted by this assay, and inadequate number of viral copies (<131 copies/mL). A negative result must be combined with clinical observations, patient history, and epidemiological information. The expected result is Negative.  Fact Sheet for Patients:  PinkCheek.be  Fact Sheet for Healthcare Providers:  GravelBags.it  This test is no t yet approved or cleared by the [place] FDA and  has been authorized for detection and/or diagnosis of SARS-CoV-2 by FDA under an Emergency Use Authorization (EUA). This EUA will remain  in effect (meaning this test can be used) for the duration of the COVID-19 declaration under Section 564(b)(1) of the  Act, 21 U.S.C. section 360bbb-3(b)(1), unless the authorization is terminated or revoked sooner.     Influenza A by PCR NEGATIVE NEGATIVE Final   Influenza B by PCR NEGATIVE NEGATIVE  Final    Comment: (NOTE) The Xpert Xpress SARS-CoV-2/FLU/RSV assay is intended as an aid in  the diagnosis of influenza from Nasopharyngeal swab specimens and  should not be used as a sole basis for treatment. Nasal washings and  aspirates are unacceptable for Xpert Xpress SARS-CoV-2/FLU/RSV  testing.  Fact Sheet for Patients: PinkCheek.be  Fact Sheet for Healthcare Providers: GravelBags.it  This test is not yet approved or cleared by the [place] FDA and  has been authorized for detection and/or diagnosis of SARS-CoV-2 by  FDA under an Emergency Use Authorization (EUA). This EUA will remain  in effect (meaning this test can be used) for the duration of the  Covid-19 declaration under Section 564(b)(1) of the Act, 21  U.S.C. section 360bbb-3(b)(1), unless the authorization is  terminated or revoked. Performed at [facility], [address] [place], [place] [address]   Surgical PCR screen     Status: None   Collection Time: 03/03/20  1:55 AM   Specimen: Nasal Mucosa; Nasal Swab  Result Value Ref Range Status   MRSA, PCR NEGATIVE NEGATIVE Final   Staphylococcus aureus NEGATIVE NEGATIVE Final    Comment: (NOTE) The Xpert SA Assay (FDA approved for NASAL specimens in patients 64 years of age and older), is one component of a comprehensive surveillance program. It is not intended to diagnose infection nor to guide or monitor treatment. Performed at [facility], [address] [place], [place] [address]      Time coordinating discharge: Over 30 minutes  SIGNED:   Nolberto Hanlon, MD  [facility] 03/07/2020, 10:03 AM Pager   If 7PM-7AM, please contact night-coverage [email or web address] Password [email or web address]

## 2020-03-07 NOTE — Progress Notes (Signed)
Patient unable to sleep. NP Ouma notified at 0118. Trazodone 100 mg prescribed. Patient refused, felt as though this amount was "too strong". He wanted his Norco instead. NP notified.   [id number] Patient still unable to sleep. NP Ouma made aware. She spoke to patient. Trazodone 50 mg ordered. I was told to give.

## 2020-03-07 NOTE — Progress Notes (Signed)
Patient is stable and ready to discharge to [facility]. Patient's IV removed and wound vac switched to the prevena wound vac and educated on the difference between the other wound vac switched from. Patient was able to have BM. Writer called report and went over discharge paperwork spoke with Levada Dy, RN receiving nurse. Patient's belongings packed and dressed by NT. Patient is being transported via [facility].

## 2020-03-07 NOTE — Progress Notes (Signed)
Writer in to educate patient on importance of having a BM before he can d/c to [facility] today d/t facility policy he needs to have had a BM within 3 days of him admitting. Patient asked for prune juice and did not want the fleet enema nor milk of Mg. MD was made aware and contacted patient's daughter and she called and spoke with patient about having the BM so he can discharge today.

## 2020-03-07 NOTE — TOC Progression Note (Addendum)
Transition of Care [facility]) - Progression Note    Patient Details  Name: Raymond Barnett MRN: [id number] Date of Birth: [birth year]  Transition of Care [facility]) CM/SW [place], LCSW Phone Number: 03/07/2020, 10:24 AM  Clinical Narrative:   Patient has a bed at [facility] today, room 402. Updated RN and MD. Per RN, patient has not had a bowel movement yet. Per Magda Paganini at [facility], patient must have a bowel movement before discharging to SNF. Per Magda Paganini, patient has to have had a bowel movement in the last 3 days. Updated RN and MD. Magda Paganini reported patient's COVID swab from 11/12 is fine, patient will not need a new one if patient discharges to SNF today.         Expected Discharge Plan and Services           Expected Discharge Date: 03/07/20                                     Social Determinants of Health (SDOH) Interventions    Readmission Risk Interventions No flowsheet data found.

## 2020-03-08 DIAGNOSIS — I5032 Chronic diastolic (congestive) heart failure: Secondary | ICD-10-CM | POA: Diagnosis not present

## 2020-03-08 DIAGNOSIS — M25552 Pain in left hip: Secondary | ICD-10-CM | POA: Diagnosis not present

## 2020-03-08 DIAGNOSIS — J9611 Chronic respiratory failure with hypoxia: Secondary | ICD-10-CM | POA: Diagnosis not present

## 2020-03-08 DIAGNOSIS — Z8781 Personal history of (healed) traumatic fracture: Secondary | ICD-10-CM | POA: Diagnosis not present

## 2020-03-08 DIAGNOSIS — J449 Chronic obstructive pulmonary disease, unspecified: Secondary | ICD-10-CM | POA: Diagnosis not present

## 2020-03-10 ENCOUNTER — Ambulatory Visit: Payer: Medicare Other | Admitting: Family Medicine

## 2020-03-14 ENCOUNTER — Ambulatory Visit: Payer: Medicare Other | Admitting: Physician Assistant

## 2020-03-15 ENCOUNTER — Inpatient Hospital Stay: Payer: Medicare Other | Admitting: Family Medicine

## 2020-03-16 ENCOUNTER — Inpatient Hospital Stay
Admission: EM | Admit: 2020-03-16 | Discharge: 2020-04-22 | DRG: 196 | Disposition: E | Payer: Medicare Other | Source: Skilled Nursing Facility | Attending: Internal Medicine | Admitting: Internal Medicine

## 2020-03-16 ENCOUNTER — Emergency Department: Payer: Medicare Other

## 2020-03-16 ENCOUNTER — Inpatient Hospital Stay: Payer: Medicare Other

## 2020-03-16 ENCOUNTER — Other Ambulatory Visit: Payer: Self-pay

## 2020-03-16 DIAGNOSIS — N400 Enlarged prostate without lower urinary tract symptoms: Secondary | ICD-10-CM | POA: Diagnosis present

## 2020-03-16 DIAGNOSIS — I739 Peripheral vascular disease, unspecified: Secondary | ICD-10-CM | POA: Diagnosis present

## 2020-03-16 DIAGNOSIS — Z66 Do not resuscitate: Secondary | ICD-10-CM | POA: Diagnosis present

## 2020-03-16 DIAGNOSIS — Z9889 Other specified postprocedural states: Secondary | ICD-10-CM

## 2020-03-16 DIAGNOSIS — J9621 Acute and chronic respiratory failure with hypoxia: Secondary | ICD-10-CM | POA: Diagnosis present

## 2020-03-16 DIAGNOSIS — D6859 Other primary thrombophilia: Secondary | ICD-10-CM | POA: Diagnosis not present

## 2020-03-16 DIAGNOSIS — J9601 Acute respiratory failure with hypoxia: Secondary | ICD-10-CM | POA: Diagnosis not present

## 2020-03-16 DIAGNOSIS — I4819 Other persistent atrial fibrillation: Secondary | ICD-10-CM | POA: Diagnosis not present

## 2020-03-16 DIAGNOSIS — I5023 Acute on chronic systolic (congestive) heart failure: Secondary | ICD-10-CM | POA: Diagnosis not present

## 2020-03-16 DIAGNOSIS — R0602 Shortness of breath: Secondary | ICD-10-CM | POA: Diagnosis not present

## 2020-03-16 DIAGNOSIS — K8020 Calculus of gallbladder without cholecystitis without obstruction: Secondary | ICD-10-CM | POA: Diagnosis not present

## 2020-03-16 DIAGNOSIS — N179 Acute kidney failure, unspecified: Secondary | ICD-10-CM

## 2020-03-16 DIAGNOSIS — Z20822 Contact with and (suspected) exposure to covid-19: Secondary | ICD-10-CM | POA: Diagnosis present

## 2020-03-16 DIAGNOSIS — Y95 Nosocomial condition: Secondary | ICD-10-CM | POA: Diagnosis present

## 2020-03-16 DIAGNOSIS — D6869 Other thrombophilia: Secondary | ICD-10-CM | POA: Diagnosis not present

## 2020-03-16 DIAGNOSIS — I429 Cardiomyopathy, unspecified: Secondary | ICD-10-CM | POA: Diagnosis present

## 2020-03-16 DIAGNOSIS — D7589 Other specified diseases of blood and blood-forming organs: Secondary | ICD-10-CM | POA: Diagnosis not present

## 2020-03-16 DIAGNOSIS — Z515 Encounter for palliative care: Secondary | ICD-10-CM

## 2020-03-16 DIAGNOSIS — Z87891 Personal history of nicotine dependence: Secondary | ICD-10-CM | POA: Diagnosis not present

## 2020-03-16 DIAGNOSIS — J189 Pneumonia, unspecified organism: Secondary | ICD-10-CM

## 2020-03-16 DIAGNOSIS — I248 Other forms of acute ischemic heart disease: Secondary | ICD-10-CM | POA: Diagnosis present

## 2020-03-16 DIAGNOSIS — J449 Chronic obstructive pulmonary disease, unspecified: Secondary | ICD-10-CM | POA: Diagnosis present

## 2020-03-16 DIAGNOSIS — I5031 Acute diastolic (congestive) heart failure: Secondary | ICD-10-CM | POA: Diagnosis not present

## 2020-03-16 DIAGNOSIS — Z7982 Long term (current) use of aspirin: Secondary | ICD-10-CM

## 2020-03-16 DIAGNOSIS — J9690 Respiratory failure, unspecified, unspecified whether with hypoxia or hypercapnia: Secondary | ICD-10-CM | POA: Diagnosis not present

## 2020-03-16 DIAGNOSIS — J8410 Pulmonary fibrosis, unspecified: Principal | ICD-10-CM | POA: Diagnosis present

## 2020-03-16 DIAGNOSIS — R6520 Severe sepsis without septic shock: Secondary | ICD-10-CM

## 2020-03-16 DIAGNOSIS — R918 Other nonspecific abnormal finding of lung field: Secondary | ICD-10-CM | POA: Diagnosis present

## 2020-03-16 DIAGNOSIS — Z79899 Other long term (current) drug therapy: Secondary | ICD-10-CM

## 2020-03-16 DIAGNOSIS — R0489 Hemorrhage from other sites in respiratory passages: Secondary | ICD-10-CM | POA: Diagnosis not present

## 2020-03-16 DIAGNOSIS — D539 Nutritional anemia, unspecified: Secondary | ICD-10-CM | POA: Diagnosis present

## 2020-03-16 DIAGNOSIS — R0902 Hypoxemia: Secondary | ICD-10-CM | POA: Diagnosis not present

## 2020-03-16 DIAGNOSIS — E876 Hypokalemia: Secondary | ICD-10-CM | POA: Diagnosis present

## 2020-03-16 DIAGNOSIS — I110 Hypertensive heart disease with heart failure: Secondary | ICD-10-CM | POA: Diagnosis present

## 2020-03-16 DIAGNOSIS — I5033 Acute on chronic diastolic (congestive) heart failure: Secondary | ICD-10-CM

## 2020-03-16 DIAGNOSIS — J918 Pleural effusion in other conditions classified elsewhere: Secondary | ICD-10-CM | POA: Diagnosis present

## 2020-03-16 DIAGNOSIS — J849 Interstitial pulmonary disease, unspecified: Secondary | ICD-10-CM | POA: Diagnosis present

## 2020-03-16 DIAGNOSIS — D696 Thrombocytopenia, unspecified: Secondary | ICD-10-CM | POA: Diagnosis not present

## 2020-03-16 DIAGNOSIS — Z96641 Presence of right artificial hip joint: Secondary | ICD-10-CM | POA: Diagnosis present

## 2020-03-16 DIAGNOSIS — H409 Unspecified glaucoma: Secondary | ICD-10-CM | POA: Diagnosis present

## 2020-03-16 DIAGNOSIS — D6959 Other secondary thrombocytopenia: Secondary | ICD-10-CM | POA: Diagnosis present

## 2020-03-16 DIAGNOSIS — R9389 Abnormal findings on diagnostic imaging of other specified body structures: Secondary | ICD-10-CM

## 2020-03-16 DIAGNOSIS — A419 Sepsis, unspecified organism: Secondary | ICD-10-CM | POA: Insufficient documentation

## 2020-03-16 DIAGNOSIS — Z9981 Dependence on supplemental oxygen: Secondary | ICD-10-CM

## 2020-03-16 DIAGNOSIS — J90 Pleural effusion, not elsewhere classified: Secondary | ICD-10-CM | POA: Diagnosis not present

## 2020-03-16 DIAGNOSIS — J9620 Acute and chronic respiratory failure, unspecified whether with hypoxia or hypercapnia: Secondary | ICD-10-CM

## 2020-03-16 DIAGNOSIS — I10 Essential (primary) hypertension: Secondary | ICD-10-CM | POA: Diagnosis present

## 2020-03-16 DIAGNOSIS — R5381 Other malaise: Secondary | ICD-10-CM | POA: Diagnosis present

## 2020-03-16 DIAGNOSIS — R140 Abdominal distension (gaseous): Secondary | ICD-10-CM | POA: Diagnosis not present

## 2020-03-16 DIAGNOSIS — J441 Chronic obstructive pulmonary disease with (acute) exacerbation: Secondary | ICD-10-CM | POA: Diagnosis not present

## 2020-03-16 DIAGNOSIS — Z7901 Long term (current) use of anticoagulants: Secondary | ICD-10-CM | POA: Diagnosis not present

## 2020-03-16 DIAGNOSIS — N170 Acute kidney failure with tubular necrosis: Secondary | ICD-10-CM | POA: Diagnosis not present

## 2020-03-16 DIAGNOSIS — Z96651 Presence of right artificial knee joint: Secondary | ICD-10-CM | POA: Diagnosis present

## 2020-03-16 DIAGNOSIS — R0603 Acute respiratory distress: Secondary | ICD-10-CM

## 2020-03-16 DIAGNOSIS — R000 Tachycardia, unspecified: Secondary | ICD-10-CM | POA: Diagnosis not present

## 2020-03-16 DIAGNOSIS — I4891 Unspecified atrial fibrillation: Secondary | ICD-10-CM | POA: Diagnosis not present

## 2020-03-16 DIAGNOSIS — I5043 Acute on chronic combined systolic (congestive) and diastolic (congestive) heart failure: Secondary | ICD-10-CM | POA: Diagnosis present

## 2020-03-16 DIAGNOSIS — R17 Unspecified jaundice: Secondary | ICD-10-CM

## 2020-03-16 DIAGNOSIS — Z7189 Other specified counseling: Secondary | ICD-10-CM | POA: Diagnosis not present

## 2020-03-16 DIAGNOSIS — I480 Paroxysmal atrial fibrillation: Secondary | ICD-10-CM | POA: Diagnosis not present

## 2020-03-16 DIAGNOSIS — R0689 Other abnormalities of breathing: Secondary | ICD-10-CM | POA: Diagnosis not present

## 2020-03-16 DIAGNOSIS — J438 Other emphysema: Secondary | ICD-10-CM | POA: Diagnosis present

## 2020-03-16 DIAGNOSIS — R069 Unspecified abnormalities of breathing: Secondary | ICD-10-CM | POA: Diagnosis not present

## 2020-03-16 DIAGNOSIS — J9811 Atelectasis: Secondary | ICD-10-CM | POA: Diagnosis not present

## 2020-03-16 LAB — CBC WITH DIFFERENTIAL/PLATELET
Abs Immature Granulocytes: 0.39 10*3/uL — ABNORMAL HIGH (ref 0.00–0.07)
Basophils Absolute: 0.1 10*3/uL (ref 0.0–0.1)
Basophils Relative: 0 %
Eosinophils Absolute: 0 10*3/uL (ref 0.0–0.5)
Eosinophils Relative: 0 %
HCT: 33 % — ABNORMAL LOW (ref 39.0–52.0)
Hemoglobin: 10.4 g/dL — ABNORMAL LOW (ref 13.0–17.0)
Immature Granulocytes: 3 %
Lymphocytes Relative: 4 %
Lymphs Abs: 0.7 10*3/uL (ref 0.7–4.0)
MCH: 31.6 pg (ref 26.0–34.0)
MCHC: 31.5 g/dL (ref 30.0–36.0)
MCV: 100.3 fL — ABNORMAL HIGH (ref 80.0–100.0)
Monocytes Absolute: 1.1 10*3/uL — ABNORMAL HIGH (ref 0.1–1.0)
Monocytes Relative: 7 %
Neutro Abs: 13.6 10*3/uL — ABNORMAL HIGH (ref 1.7–7.7)
Neutrophils Relative %: 86 %
Platelets: 275 10*3/uL (ref 150–400)
RBC: 3.29 MIL/uL — ABNORMAL LOW (ref 4.22–5.81)
RDW: 23.8 % — ABNORMAL HIGH (ref 11.5–15.5)
Smear Review: NORMAL
WBC: 15.8 10*3/uL — ABNORMAL HIGH (ref 4.0–10.5)
nRBC: 1.1 % — ABNORMAL HIGH (ref 0.0–0.2)

## 2020-03-16 LAB — LACTIC ACID, PLASMA
Lactic Acid, Venous: 3.3 mmol/L (ref 0.5–1.9)
Lactic Acid, Venous: 2.9 mmol/L (ref 0.5–1.9)

## 2020-03-16 LAB — RESP PANEL BY RT-PCR (FLU A&B, COVID) ARPGX2
Influenza A by PCR: NEGATIVE
Influenza B by PCR: NEGATIVE
SARS Coronavirus 2 by RT PCR: NEGATIVE

## 2020-03-16 LAB — COMPREHENSIVE METABOLIC PANEL
ALT: 35 U/L (ref 0–44)
AST: 57 U/L — ABNORMAL HIGH (ref 15–41)
Albumin: 3.1 g/dL — ABNORMAL LOW (ref 3.5–5.0)
Alkaline Phosphatase: 88 U/L (ref 38–126)
Anion gap: 12 (ref 5–15)
BUN: 18 mg/dL (ref 8–23)
CO2: 26 mmol/L (ref 22–32)
Calcium: 9 mg/dL (ref 8.9–10.3)
Chloride: 94 mmol/L — ABNORMAL LOW (ref 98–111)
Creatinine, Ser: 0.82 mg/dL (ref 0.61–1.24)
GFR, Estimated: >60 mL/min (ref >60)
Glucose, Bld: 128 mg/dL — ABNORMAL HIGH (ref 70–99)
Potassium: 4.2 mmol/L (ref 3.5–5.1)
Sodium: 132 mmol/L — ABNORMAL LOW (ref 135–145)
Total Bilirubin: 3.2 mg/dL — ABNORMAL HIGH (ref 0.3–1.2)
Total Protein: 6.3 g/dL — ABNORMAL LOW (ref 6.5–8.1)

## 2020-03-16 LAB — TROPONIN I (HIGH SENSITIVITY)
Troponin I (High Sensitivity): 159 ng/L (ref <18)
Troponin I (High Sensitivity): 244 ng/L (ref <18)
Troponin I (High Sensitivity): 294 ng/L (ref <18)

## 2020-03-16 LAB — GLUCOSE, CAPILLARY: Glucose-Capillary: 116 mg/dL — ABNORMAL HIGH (ref 70–99)

## 2020-03-16 LAB — BRAIN NATRIURETIC PEPTIDE: B Natriuretic Peptide: 1276.9 pg/mL — ABNORMAL HIGH (ref 0.0–100.0)

## 2020-03-16 LAB — PROCALCITONIN: Procalcitonin: <0.1 ng/mL

## 2020-03-16 MED ORDER — FLUTICASONE PROPIONATE 50 MCG/ACT NA SUSP
2.0000 | Freq: Every day | NASAL | Status: DC | PRN
  Administered 2020-03-24: 2 via NASAL
  Filled 2020-03-16 (×2): qty 16

## 2020-03-16 MED ORDER — FUROSEMIDE 10 MG/ML IJ SOLN
40.0000 mg | Freq: Once | INTRAMUSCULAR | Status: AC
  Administered 2020-03-16: 40 mg via INTRAVENOUS
  Filled 2020-03-16: qty 4

## 2020-03-16 MED ORDER — CARBOXYMETHYLCELLULOSE SOD PF 0.5 % OP SOLN: 1.0000 "application " | OPHTHALMIC | Status: DC | PRN

## 2020-03-16 MED ORDER — ACETAMINOPHEN 650 MG RE SUPP: 650.0000 mg | Freq: Four times a day (QID) | RECTAL | Status: DC | PRN

## 2020-03-16 MED ORDER — ALUM & MAG HYDROXIDE-SIMETH 200-200-20 MG/5ML PO SUSP
30.0000 mL | ORAL | Status: DC | PRN
  Administered 2020-03-23: 30 mL via ORAL
  Filled 2020-03-16: qty 30

## 2020-03-16 MED ORDER — VITAMIN D3 25 MCG (1000 UNIT) PO TABS
1000.0000 [IU] | ORAL_TABLET | Freq: Every day | ORAL | Status: DC
  Administered 2020-03-16 – 2020-03-25 (×10): 1000 [IU] via ORAL
  Filled 2020-03-16 (×21): qty 1

## 2020-03-16 MED ORDER — POLYVINYL ALCOHOL 1.4 % OP SOLN
1.0000 [drp] | OPHTHALMIC | Status: DC | PRN
  Filled 2020-03-16 (×2): qty 15

## 2020-03-16 MED ORDER — METHOCARBAMOL 500 MG PO TABS
500.0000 mg | ORAL_TABLET | Freq: Four times a day (QID) | ORAL | Status: DC | PRN
  Administered 2020-03-21 – 2020-03-23 (×3): 500 mg via ORAL
  Filled 2020-03-16 (×6): qty 1

## 2020-03-16 MED ORDER — POLYETHYLENE GLYCOL 3350 17 G PO PACK
17.0000 g | PACK | Freq: Every day | ORAL | Status: DC
  Administered 2020-03-16 – 2020-03-25 (×8): 17 g via ORAL
  Filled 2020-03-16 (×8): qty 1

## 2020-03-16 MED ORDER — ACETAMINOPHEN 325 MG PO TABS
650.0000 mg | ORAL_TABLET | Freq: Four times a day (QID) | ORAL | Status: DC | PRN
  Administered 2020-03-18 – 2020-03-22 (×5): 650 mg via ORAL
  Filled 2020-03-16 (×5): qty 2

## 2020-03-16 MED ORDER — BISACODYL 10 MG RE SUPP
10.0000 mg | Freq: Every day | RECTAL | Status: DC | PRN
  Administered 2020-03-17: 10 mg via RECTAL
  Filled 2020-03-16 (×2): qty 1

## 2020-03-16 MED ORDER — PANTOPRAZOLE SODIUM 40 MG PO TBEC
40.0000 mg | DELAYED_RELEASE_TABLET | Freq: Every day | ORAL | Status: DC
  Administered 2020-03-16 – 2020-03-25 (×10): 40 mg via ORAL
  Filled 2020-03-16 (×10): qty 1

## 2020-03-16 MED ORDER — ENOXAPARIN SODIUM 40 MG/0.4ML ~~LOC~~ SOLN: 40.0000 mg | SUBCUTANEOUS | Status: DC

## 2020-03-16 MED ORDER — VANCOMYCIN HCL 1250 MG/250ML IV SOLN
1250.0000 mg | Freq: Two times a day (BID) | INTRAVENOUS | Status: DC
  Administered 2020-03-16 – 2020-03-17 (×2): 1250 mg via INTRAVENOUS
  Filled 2020-03-16 (×3): qty 250

## 2020-03-16 MED ORDER — ONDANSETRON HCL 4 MG PO TABS: 4.0000 mg | ORAL_TABLET | Freq: Four times a day (QID) | ORAL | Status: DC | PRN

## 2020-03-16 MED ORDER — SODIUM CHLORIDE 0.9 % IV SOLN
2.0000 g | Freq: Once | INTRAVENOUS | Status: AC
  Administered 2020-03-16: 2 g via INTRAVENOUS
  Filled 2020-03-16: qty 2

## 2020-03-16 MED ORDER — FUROSEMIDE 10 MG/ML IJ SOLN
40.0000 mg | Freq: Two times a day (BID) | INTRAMUSCULAR | Status: DC
  Administered 2020-03-16 – 2020-03-20 (×8): 40 mg via INTRAVENOUS
  Filled 2020-03-16 (×8): qty 4

## 2020-03-16 MED ORDER — IOHEXOL 350 MG/ML SOLN
75.0000 mL | Freq: Once | INTRAVENOUS | Status: AC | PRN
  Administered 2020-03-16: 75 mL via INTRAVENOUS

## 2020-03-16 MED ORDER — VANCOMYCIN HCL 2000 MG/400ML IV SOLN
2000.0000 mg | Freq: Once | INTRAVENOUS | Status: AC
  Administered 2020-03-16: 2000 mg via INTRAVENOUS
  Filled 2020-03-16 (×2): qty 400

## 2020-03-16 MED ORDER — SODIUM CHLORIDE 0.9 % IV SOLN
2.0000 g | Freq: Three times a day (TID) | INTRAVENOUS | Status: DC
  Administered 2020-03-16 – 2020-03-18 (×6): 2 g via INTRAVENOUS
  Filled 2020-03-16 (×8): qty 2

## 2020-03-16 MED ORDER — IPRATROPIUM-ALBUTEROL 0.5-2.5 (3) MG/3ML IN SOLN
3.0000 mL | Freq: Once | RESPIRATORY_TRACT | Status: AC
  Administered 2020-03-16: 3 mL via RESPIRATORY_TRACT
  Filled 2020-03-16: qty 3

## 2020-03-16 MED ORDER — TRAZODONE HCL 100 MG PO TABS
100.0000 mg | ORAL_TABLET | Freq: Every day | ORAL | Status: DC
  Administered 2020-03-17 – 2020-03-22 (×6): 100 mg via ORAL
  Filled 2020-03-16 (×8): qty 1

## 2020-03-16 MED ORDER — ENOXAPARIN SODIUM 40 MG/0.4ML ~~LOC~~ SOLN
40.0000 mg | SUBCUTANEOUS | Status: DC
  Administered 2020-03-16: 40 mg via SUBCUTANEOUS
  Filled 2020-03-16: qty 0.4

## 2020-03-16 MED ORDER — CALCIUM CARBONATE-VITAMIN D 500-200 MG-UNIT PO TABS
1.0000 | ORAL_TABLET | Freq: Every day | ORAL | Status: DC
  Administered 2020-03-16 – 2020-03-25 (×10): 1 via ORAL
  Filled 2020-03-16 (×10): qty 1

## 2020-03-16 MED ORDER — IPRATROPIUM-ALBUTEROL 0.5-2.5 (3) MG/3ML IN SOLN: 3.0000 mL | Freq: Four times a day (QID) | RESPIRATORY_TRACT | Status: DC | PRN

## 2020-03-16 MED ORDER — TIMOLOL MALEATE 0.5 % OP SOLN
1.0000 [drp] | Freq: Two times a day (BID) | OPHTHALMIC | Status: DC
  Administered 2020-03-16 – 2020-03-25 (×20): 1 [drp] via OPHTHALMIC
  Filled 2020-03-16: qty 5

## 2020-03-16 MED ORDER — DOCUSATE SODIUM 100 MG PO CAPS
100.0000 mg | ORAL_CAPSULE | Freq: Two times a day (BID) | ORAL | Status: DC
  Administered 2020-03-17 – 2020-03-25 (×16): 100 mg via ORAL
  Filled 2020-03-16 (×17): qty 1

## 2020-03-16 MED ORDER — ASPIRIN 81 MG PO CHEW
81.0000 mg | CHEWABLE_TABLET | Freq: Every day | ORAL | Status: DC
  Administered 2020-03-16 – 2020-03-20 (×5): 81 mg via ORAL
  Filled 2020-03-16 (×5): qty 1

## 2020-03-16 MED ORDER — ONDANSETRON HCL 4 MG/2ML IJ SOLN
4.0000 mg | Freq: Four times a day (QID) | INTRAMUSCULAR | Status: DC | PRN
  Administered 2020-03-19 – 2020-03-20 (×2): 4 mg via INTRAVENOUS
  Filled 2020-03-16 (×2): qty 2

## 2020-03-16 MED ORDER — FINASTERIDE 5 MG PO TABS
5.0000 mg | ORAL_TABLET | Freq: Every day | ORAL | Status: DC
  Administered 2020-03-16 – 2020-03-25 (×10): 5 mg via ORAL
  Filled 2020-03-16 (×10): qty 1

## 2020-03-16 NOTE — ED Triage Notes (Signed)
BIBA from [facility] for SOB, 82% on RA, [id number], unable to obtain BP. H/o recent R hip surgery. Arrives awake alert in NAD on NRB.

## 2020-03-16 NOTE — Consult Note (Signed)
PHARMACY -  BRIEF ANTIBIOTIC NOTE   Pharmacy has received consult(s) for vancomycin and cefepime from an ED provider.  The patient's profile has been reviewed for ht/wt/allergies/indication/available labs.    One time order(s) placed for: Vancomycin 2000mg  IV Cefepime 2g IV  Further antibiotics/pharmacy consults should be ordered by admitting physician if indicated.                       Sherilyn Banker, PharmD Pharmacy Resident  02/23/2020 8:47 AM

## 2020-03-16 NOTE — Progress Notes (Signed)
Pharmacy Antibiotic Note  Raymond Barnett is a 86 y.o. male with PMH chronic diastolic dysfunction CHF, COPD, status post recent right total hip arthroplasty and peripheral arterial disease who presents from the skilled nursing facility with SOB. Patient received vanc 2g IV loading dose in ED. Pharmacy has been consulted for vancomycin and cefepime dosing for pneumonia. CXR concerning for multifocal infection  Plan: Vancomycin 1250 IV every 12 hours per dosing nomogram Cefepime 2g IV every 8 hours  Monitor renal function and adjust doses as clinically indicated If vanc is continued, order vanc level prior to 4th or 5th dose  Height: 5\' 10"  (177.8 cm) Weight: 93 kg (205 lb) IBW/kg (Calculated) : 73  Temp (24hrs), Avg:97.6 F (36.4 C), Min:97.6 F (36.4 C), Max:97.6 F (36.4 C)  Recent Labs  Lab 03/18/2020 0753 03/21/2020 0920  WBC 15.8*  --   CREATININE  --  0.82  LATICACIDVEN 3.3*  --     Estimated Creatinine Clearance: 71.3 mL/min (by C-G formula based on SCr of 0.82 mg/dL).    No Known Allergies  Antimicrobials this admission: 11/25 Vancomycin >>  11/25 Cefepime >>    Microbiology results: 11/25 BCx: pending  Thank you for allowing pharmacy to be a part of this patient's care.  Sherilyn Banker, PharmD Pharmacy Resident  02/22/2020 10:35 AM

## 2020-03-16 NOTE — H&P (Signed)
History and Physical    Raymond Barnett KMQ:[id number] DOB: 05-Jan-1930 DOA: 03/07/2020  PCP: [name], DO   Patient coming from: SNF  I have personally briefly reviewed patient's old medical records in [place]  Chief Complaint: Shortness of breath  HPI: Raymond Barnett is a 86 y.o. male with medical history significant for chronic diastolic dysfunction CHF, COPD, status post recent right total hip arthroplasty and peripheral arterial disease who presents from the skilled nursing facility for evaluation of shortness of breath.  Shortness of breath is worse with any form of exertion and is associated with a cough productive of clear phlegm.  He has no lower extremity swelling. He denies having any orthopnea.  Patient states that his movement has been limited due to shortness of breath as well as pain in his hip. He denies having any chest pain, no fever, no chills, no nausea, no vomiting, no diaphoresis, no palpitations, no urinary symptoms Labs show sodium 132, potassium 4.2, chloride 94, bicarb 26, glucose 128, BUN 18, creatinine 0.82, calcium 9.0, alkaline phosphatase 88, albumin 3.1, AST 57, ALT 35, total protein 6.3, total bilirubin 3.2, BNP 1276, troponin I 59, lactic acid 3.3, procalcitonin less than 0.10, white count 15.8, hemoglobin 10.4, hematocrit 33, MCV 100, RDW 23.8, platelet count 275 Respiratory viral panel is negative. Chest x-ray reviewed by me shows worsening CHF pattern.  New bilateral airspace opacities concerning for multifocal infection.  Twelve-lead EKG shows sinus tachycardia with a right bundle branch block and left anterior fascicular block    ED Course: Patient is an 86 year old male status post recent right total hip arthroplasty who presents to the emergency room for evaluation of shortness of breath and was noted to be hypoxic in the field with room air pulse oximetry in the 80s.  This improved following oxygen supplementation at 12 L to the  90s.  Patient is currently on high flow nasal cannula 45 L, 55% FiO2.  Patient was tachycardic and tachypneic upon arrival to the ER, has marked leukocytosis with a left shift and elevated lactic acid level of 3.3, chest x-ray shows multifocal pneumonia.  Patient received a dose of vancomycin and cefepime in the ER but did not receive sepsis fluid due to acute diastolic dysfunction CHF.  He will be admitted to the hospital for further evaluation.  Review of Systems: As per HPI otherwise 10 point review of systems negative.    Past Medical History:  Diagnosis Date  . (HFpEF) heart failure with improved ejection fraction ([place])    a. 08/2017 Echo: EF 45-50%; b. 10/2019 Echo: EF 55-60%. No rwma, mild LVH, Gr2 DD, RVSP 47.9mmHg. Mod dil LA. Mod MR. Asc Ao 47mm.  . Arthritis   . Benign prostate hyperplasia    lower urinary tract symptoms  . Cataracts, bilateral   . COPD (chronic obstructive pulmonary disease) ([place])   . DDD (degenerative disc disease), lumbar   . Glaucoma   . H/O carpal tunnel syndrome   . History of osteomyelitis 1953   right leg  . Hypertension   . Moderate mitral regurgitation    a. 10/2019 Echo: Mod MR.  Marland Kitchen PAD (peripheral artery disease) ([place])    a. 08/2017 LE ABI & Duplex: ABI R 0.60, L 1.07. 30-49% R popliteal and 50-74% R PT stenoses.  . Thrombocytopenia ([place])     Past Surgical History:  Procedure Laterality Date  . CARPAL TUNNEL RELEASE Left   . CARPAL TUNNEL RELEASE Right   . COLONOSCOPY  2015   Dr. Allen Norris  . HIP ARTHROPLASTY Right 03/03/2020   Procedure: ARTHROPLASTY BIPOLAR HIP (HEMIARTHROPLASTY);  Surgeon: Hessie Knows, MD;  Location: [facility] ORS;  Service: Orthopedics;  Laterality: Right;  . LEG SURGERY Right 1953  . SKIN GRAFT Right 05/01/2017   right foot [facility]  . TOTAL KNEE ARTHROPLASTY Right      reports that he quit smoking about 26 years ago. His smoking use included cigarettes. He has a 30.00 pack-year smoking history. He has never used smokeless  tobacco. He reports previous alcohol use. He reports that he does not use drugs.  No Known Allergies  Family History  Problem Relation Age of Onset  . Colon cancer Mother 49  . Thrombosis Father 56  . Heart disease Father        Heart valve problem  . AAA (abdominal aortic aneurysm) Father   . Hypertension Sister   . Hyperlipidemia Sister   . Hypertension Sister   . Hyperlipidemia Sister   . Hypertension Sister   . Hyperlipidemia Sister      Prior to Admission medications   Medication Sig Start Date End Date Taking? Authorizing Provider  ASPIRIN LOW DOSE 81 MG chewable tablet Chew 81 mg by mouth daily. 07/17/19  Yes [provider]  Calcium Carb-Cholecalciferol (OYSTER SHELL CALCIUM/D) 500-5 MG-MCG TABS Take 1 tablet by mouth daily. 03/07/20  Yes Nolberto Hanlon, MD  cholecalciferol (VITAMIN D) 25 MCG (1000 UNIT) tablet Take 1,000 Units by mouth daily. 12/15/19  Yes [provider]  docusate sodium (COLACE) 100 MG capsule Take 1 capsule (100 mg total) by mouth 2 (two) times daily. 03/07/20  Yes Nolberto Hanlon, MD  enoxaparin (LOVENOX) 40 MG/0.4ML injection Inject 0.4 mLs (40 mg total) into the skin daily for 14 days. 03/07/20 03/21/20 Yes Duanne Guess, PA-C  finasteride (PROSCAR) 5 MG tablet Take 5 mg by mouth daily.   Yes [provider]  furosemide (LASIX) 20 MG tablet Take 1 tablet (20 mg total) by mouth daily. 12/30/19 03/29/20 Yes End, [name], MD  HYDROcodone-acetaminophen (NORCO/VICODIN) 5-325 MG tablet Take 1-2 tablets by mouth every 4 (four) hours as needed for moderate pain (pain score 4-6). 03/06/20  Yes Duanne Guess, PA-C  methocarbamol (ROBAXIN) 500 MG tablet Take 1 tablet (500 mg total) by mouth every 6 (six) hours as needed for muscle spasms. 03/07/20  Yes Nolberto Hanlon, MD  pantoprazole (PROTONIX) 40 MG tablet Take 1 tablet (40 mg total) by mouth daily. 03/07/20  Yes Nolberto Hanlon, MD  polyethylene glycol (MIRALAX / GLYCOLAX) 17 g packet Take  17 g by mouth daily. 03/07/20  Yes Nolberto Hanlon, MD  timolol (TIMOPTIC) 0.5 % ophthalmic solution Place 1 drop into both eyes 2 (two) times daily.    Yes [provider]  traZODone (DESYREL) 100 MG tablet Take 1 tablet (100 mg total) by mouth at bedtime. 12/22/19  Yes [name], [name], DO  acetaminophen (TYLENOL) 325 MG tablet Take 1-2 tablets (325-650 mg total) by mouth every 6 (six) hours as needed for mild pain (pain score 1-3 or temp > 100.5). 03/07/20   Nolberto Hanlon, MD  alum & mag hydroxide-simeth (MAALOX/MYLANTA) 200-200-20 MG/5ML suspension Take 30 mLs by mouth every 4 (four) hours as needed for indigestion. 03/07/20   Nolberto Hanlon, MD  bisacodyl (DULCOLAX) 10 MG suppository Place 1 suppository (10 mg total) rectally daily as needed for moderate constipation. 03/07/20   Nolberto Hanlon, MD  fluticasone (FLONASE) 50 MCG/ACT nasal spray Place 2 sprays into  both nostrils daily. Use for 4-6 weeks then stop and use seasonally or as needed. Patient taking differently: Place 2 sprays into both nostrils as directed. Use for 4-6 weeks then stop and use seasonally or as needed. 11/09/19   [name], [name], DO  LUBRICATING PLUS EYE DROPS 0.5 % SOLN Apply 1 application to eye as needed (dry eyes).  08/25/19   [provider]  magnesium citrate SOLN Take 296 mLs (1 Bottle total) by mouth once as needed for severe constipation. 03/07/20   Nolberto Hanlon, MD  OXYGEN Inhale 2 L into the lungs at bedtime.    [provider]    Physical Exam: Vitals:   02/25/2020 0830 02/27/2020 0900 03/15/2020 0930 03/21/2020 1000  BP: 120/77 113/63 (!) 121/59 110/63  Pulse: (!) 114 (!) 110 (!) 110 (!) 107  Resp: (!) 23 19 (!) 30 (!) 21  Temp:      TempSrc:      SpO2: 95% 95% 95% 95%  Weight:      Height:         Vitals:   03/21/2020 0830 03/03/2020 0900 02/29/2020 0930 03/19/2020 1000  BP: 120/77 113/63 (!) 121/59 110/63  Pulse: (!) 114 (!) 110 (!) 110 (!) 107  Resp: (!) 23 19 (!) 30 (!) 21    Temp:      TempSrc:      SpO2: 95% 95% 95% 95%  Weight:      Height:        Constitutional: NAD, alert and oriented x 3.  Has conversational dyspnea.  Appears very pale and daughter is concerned he may have a yellow tinge Eyes: PERRL, lids and conjunctivae pallor ENMT: Mucous membranes are moist.  Neck: normal, supple, no masses, no thyromegaly Respiratory: Bilateral air entry, coarse breath sounds, no wheezing, fine crackles at the bases. Normal respiratory effort. No accessory muscle use.  Cardiovascular: Tachycardic, no murmurs / rubs / gallops. No extremity edema. 2+ pedal pulses. No carotid bruits.  Abdomen: no tenderness, no masses palpated. No hepatosplenomegaly. Bowel sounds positive.  Musculoskeletal: no clubbing / cyanosis. No joint deformity upper and lower extremities.  Skin: no rashes, lesions, ulcers.  Neurologic: No gross focal neurologic deficit.  Generalized weakness Psychiatric: Normal mood and affect.   Labs on Admission: I have personally reviewed following labs and imaging studies  CBC: Recent Labs  Lab 03/13/2020 0753  WBC 15.8*  NEUTROABS 13.6*  HGB 10.4*  HCT 33.0*  MCV 100.3*  PLT 967   Basic Metabolic Panel: Recent Labs  Lab 03/20/2020 0920  NA 132*  K 4.2  CL 94*  CO2 26  GLUCOSE 128*  BUN 18  CREATININE 0.82  CALCIUM 9.0   GFR: Estimated Creatinine Clearance: 71.3 mL/min (by C-G formula based on SCr of 0.82 mg/dL). Liver Function Tests: Recent Labs  Lab 03/20/2020 0920  AST 57*  ALT 35  ALKPHOS 88  BILITOT 3.2*  PROT 6.3*  ALBUMIN 3.1*   No results for input(s): LIPASE, AMYLASE in the last 168 hours. No results for input(s): AMMONIA in the last 168 hours. Coagulation Profile: No results for input(s): INR, PROTIME in the last 168 hours. Cardiac Enzymes: No results for input(s): CKTOTAL, CKMB, CKMBINDEX, TROPONINI in the last 168 hours. BNP (last 3 results) No results for input(s): PROBNP in the last 8760 hours. HbA1C: No  results for input(s): HGBA1C in the last 72 hours. CBG: No results for input(s): GLUCAP in the last 168 hours. Lipid Profile: No results for input(s): CHOL,  HDL, LDLCALC, TRIG, CHOLHDL, LDLDIRECT in the last 72 hours. Thyroid Function Tests: No results for input(s): TSH, T4TOTAL, FREET4, T3FREE, THYROIDAB in the last 72 hours. Anemia Panel: No results for input(s): VITAMINB12, FOLATE, FERRITIN, TIBC, IRON, RETICCTPCT in the last 72 hours. Urine analysis:    Component Value Date/Time   COLORURINE YELLOW (A) 03/02/2020 1228   APPEARANCEUR CLEAR (A) 03/02/2020 1228   APPEARANCEUR Clear 12/12/2011 1211   LABSPEC 1.008 03/02/2020 1228   LABSPEC 1.006 12/12/2011 1211   PHURINE 6.0 03/02/2020 1228   GLUCOSEU NEGATIVE 03/02/2020 1228   GLUCOSEU Negative 12/12/2011 1211   HGBUR NEGATIVE 03/02/2020 1228   BILIRUBINUR NEGATIVE 03/02/2020 1228   BILIRUBINUR negative 09/16/2017 1219   BILIRUBINUR Negative 12/12/2011 1211   KETONESUR NEGATIVE 03/02/2020 1228   PROTEINUR NEGATIVE 03/02/2020 1228   UROBILINOGEN 0.2 09/16/2017 1219   NITRITE NEGATIVE 03/02/2020 1228   LEUKOCYTESUR NEGATIVE 03/02/2020 1228   LEUKOCYTESUR Negative 12/12/2011 1211    Radiological Exams on Admission: DG Chest Portable 1 View  Result Date: 03/21/2020 CLINICAL DATA:  Short of breath. EXAM: PORTABLE CHEST 1 VIEW COMPARISON:  03/02/20 FINDINGS: Stable cardiomediastinal contours. Aortic atherosclerosis. Increase in bilateral pleural effusions and interstitial edema. New bilateral airspace opacities are noted particularly involving the upper lobes. IMPRESSION: 1. Worsening CHF pattern. 2. New bilateral airspace opacities concerning for multifocal infection. Electronically Signed   By: Kerby Moors M.D.   On: 03/13/2020 08:24    EKG: Independently reviewed.  Sinus tachycardia Right bundle branch block, left anterior fascicular block  Assessment/Plan Principal Problem:   Severe sepsis (HCC) Active Problems:    Essential hypertension   COPD (chronic obstructive pulmonary disease) (HCC)   Healthcare-associated pneumonia   Acute on chronic respiratory failure (HCC)   Acute diastolic CHF (congestive heart failure) (HCC)     Severe sepsis (POA) from healthcare associated pneumonia  As evidenced by tachycardia with heart rate of 114, tachypnea with respiratory rate of 24, marked leukocytosis with a left shift, elevated lactic acid level of 3.3 and source of sepsis appears to be multifocal pneumonia Continue vancomycin and cefepime adjusted to renal function Follow-up results of blood cultures Patient did not receive sepsis fluid detected diastolic dysfunction CHF    Acute on chronic respiratory failure Appears to be multifactorial and secondary to healthcare associated pneumonia as well as acute diastolic dysfunction CHF Patient has a history of chronic respiratory failure secondary to COPD and at baseline is on 2 L of oxygen at night Patient presented for evaluation of worsening shortness of breath with exertion and has increased work of breathing and tachypnea. He was hypoxic in the field with room air pulse oximetry in the 80s.  He is currently on high flow nasal cannula 45 L/55%FIO2 with improvement in his pulse oximetry to the 90s We will attempt to wean patient down to baseline home oxygen requirement once acute illness has resolved    Acute diastolic dysfunction CHF Last known LVEF of 55 to 60% from an echo done in July,2021 CTA of the chest shows bilateral pleural effusions with extensive bilateral interstitial and airspace disease identified. Findings are concerning for congestive heart failure.  Place patient on Lasix 40 mg IV q 12    COPD Continue as needed bronchodilator therapy    Status post recent right total hip arthroplasty Continue muscle relaxants and pain control as needed Patient will need PT once his respiratory status has improved    Elevated troponin Most  likely secondary to demand ischemia from shortness of  breath R/O ACS We will cycle cardiac enzymes Continue aspirin Consult cardiology    Jaundice Unclear etiology may be secondary to hepatic congestion from CHF Total Bilirubin is elevated at 3.2 and patient has a yellowish tinge Follow-up results of CT of abdomen and pelvis     DVT prophylaxis: Lovenox Code Status: DO NOT RESUSCITATE Family Communication: Greater than 50% of time was spent discussing patient's condition and plan of care with him and his daughter Raymond Barnett at the bedside.  All questions and concerns have been addressed.  They verbalized understanding and agree with the plan.  CODE STATUS was discussed and patient is a DO NOT RESUSCITATE. Disposition Plan: Back to previous home environment Consults called: Cardiology    Collier Bullock MD [facility]     03/05/2020, 10:32 AM  With

## 2020-03-16 NOTE — ED Provider Notes (Signed)
[facility] Emergency Department Provider Note  ____________________________________________   First MD Initiated Contact with Patient 03/09/[id number]     (approximate)  I have reviewed the triage vital signs and the nursing notes.   HISTORY  Chief Complaint Shortness of Breath    HPI Raymond Barnett is a 86 y.o. male with extensive past medical history as below including diastolic heart failure, COPD, recent hip surgery, peripheral arterial disease, here with shortness of breath.  The patient states that he was just recently discharged following surgery for his right hip.  He had been recovering well.  However, he states that he has had progressively worsening shortness of breath for the several weeks prior to his surgery, worse over the last several days.  He has had associated cough productive of white/clear sputum.  He has had severe shortness of breath with any kind of exertion, as well as limited movement due to his hip pain and this dyspnea.  He denies any overt chest pain.  Denies any leg swelling.  He states his hip pain seems to be improving since his surgery.  No fevers.  No known history of DVT/PE.  He does not know whether he is above or below his usual weight.  He does take Lasix and states has been taking it as prescribed.  He continues to urinate normally.  No other complaints.        Past Medical History:  Diagnosis Date  . (HFpEF) heart failure with improved ejection fraction ([place])    a. 08/2017 Echo: EF 45-50%; b. 10/2019 Echo: EF 55-60%. No rwma, mild LVH, Gr2 DD, RVSP 47.70mmHg. Mod dil LA. Mod MR. Asc Ao 103mm.  . Arthritis   . Benign prostate hyperplasia    lower urinary tract symptoms  . Cataracts, bilateral   . COPD (chronic obstructive pulmonary disease) ([place])   . DDD (degenerative disc disease), lumbar   . Glaucoma   . H/O carpal tunnel syndrome   . History of osteomyelitis 1953   right leg  . Hypertension   . Moderate mitral  regurgitation    a. 10/2019 Echo: Mod MR.  Marland Kitchen PAD (peripheral artery disease) ([place])    a. 08/2017 LE ABI & Duplex: ABI R 0.60, L 1.07. 30-49% R popliteal and 50-74% R PT stenoses.  . Thrombocytopenia [facility])     Patient Active Problem List   Diagnosis Date Noted  . Severe sepsis ([place]) 03/19/2020  . Healthcare-associated pneumonia 03/18/2020  . Acute on chronic respiratory failure ([place]) 02/23/2020  . Acute diastolic CHF (congestive heart failure) ([place]) 03/05/2020  . Fracture of femoral neck, right, closed ([place]) 03/02/2020  . Ambulatory dysfunction 03/02/2020  . COPD (chronic obstructive pulmonary disease) ([place]) 03/02/2020  . Glaucoma 03/02/2020  . Chronic anemia 03/02/2020  . Tracheal deviation 03/02/2020  . Primary osteoarthritis of both knees 12/29/2019  . Status post total right knee replacement 12/29/2019  . Macrocytic anemia 12/21/2019  . Critical lower limb ischemia ([place]) 04/03/2018  . Chronic diastolic CHF (congestive heart failure) ([place]), EF 55-60% 10/2019 04/03/2018  . Allergic rhinitis due to allergen 12/17/2017  . Ankle wound, right, sequela 09/17/2017  . Dyspnea on exertion 07/31/2017  . Coronary artery calcification 07/31/2017  . Claudication ([place]) 07/31/2017  . PAD (peripheral artery disease) ([place]) 07/31/2017  . Hyperlipidemia LDL goal <70 07/31/2017  . Aortic atherosclerosis ([place]) 07/31/2017  . Hemorrhoids 06/18/2017  . Paraseptal emphysema ([place]) 06/18/2017  . Benign prostatic hyperplasia with urinary frequency 05/06/2017  . Lung density  on x-ray 09/03/2016  . DDD (degenerative disc disease), lumbar 09/02/2016  . Elevated blood sugar 03/05/2016  . Peripheral neuropathy 01/23/2016  . Chronic bilateral low back pain without sciatica 05/18/2015  . Essential hypertension 02/13/2015  . Chronic fatigue 02/13/2015  . Seasonal allergies 02/13/2015    Past Surgical History:  Procedure Laterality Date  . CARPAL TUNNEL RELEASE Left   . CARPAL TUNNEL RELEASE Right   .  COLONOSCOPY  2015   Dr. Allen Norris  . HIP ARTHROPLASTY Right 03/03/2020   Procedure: ARTHROPLASTY BIPOLAR HIP (HEMIARTHROPLASTY);  Surgeon: Hessie Knows, MD;  Location: [facility] ORS;  Service: Orthopedics;  Laterality: Right;  . LEG SURGERY Right 1953  . SKIN GRAFT Right 05/01/2017   right foot at [facility]  . TOTAL KNEE ARTHROPLASTY Right     Prior to Admission medications   Medication Sig Start Date End Date Taking? Authorizing Provider  ASPIRIN LOW DOSE 81 MG chewable tablet Chew 81 mg by mouth daily. 07/17/19  Yes [provider]  Calcium Carb-Cholecalciferol (OYSTER SHELL CALCIUM/D) 500-5 MG-MCG TABS Take 1 tablet by mouth daily. 03/07/20  Yes Nolberto Hanlon, MD  cholecalciferol (VITAMIN D) 25 MCG (1000 UNIT) tablet Take 1,000 Units by mouth daily. 12/15/19  Yes [provider]  docusate sodium (COLACE) 100 MG capsule Take 1 capsule (100 mg total) by mouth 2 (two) times daily. 03/07/20  Yes Nolberto Hanlon, MD  enoxaparin (LOVENOX) 40 MG/0.4ML injection Inject 0.4 mLs (40 mg total) into the skin daily for 14 days. 03/07/20 03/21/20 Yes Duanne Guess, PA-C  finasteride (PROSCAR) 5 MG tablet Take 5 mg by mouth daily.   Yes [provider]  furosemide (LASIX) 20 MG tablet Take 1 tablet (20 mg total) by mouth daily. 12/30/19 03/29/20 Yes End, [name], MD  HYDROcodone-acetaminophen (NORCO/VICODIN) 5-325 MG tablet Take 1-2 tablets by mouth every 4 (four) hours as needed for moderate pain (pain score 4-6). 03/06/20  Yes Duanne Guess, PA-C  methocarbamol (ROBAXIN) 500 MG tablet Take 1 tablet (500 mg total) by mouth every 6 (six) hours as needed for muscle spasms. 03/07/20  Yes Nolberto Hanlon, MD  pantoprazole (PROTONIX) 40 MG tablet Take 1 tablet (40 mg total) by mouth daily. 03/07/20  Yes Nolberto Hanlon, MD  polyethylene glycol (MIRALAX / GLYCOLAX) 17 g packet Take 17 g by mouth daily. 03/07/20  Yes Nolberto Hanlon, MD  timolol (TIMOPTIC) 0.5 % ophthalmic solution Place 1 drop into both  eyes 2 (two) times daily.    Yes [provider]  traZODone (DESYREL) 100 MG tablet Take 1 tablet (100 mg total) by mouth at bedtime. 12/22/19  Yes [name], [name], DO  acetaminophen (TYLENOL) 325 MG tablet Take 1-2 tablets (325-650 mg total) by mouth every 6 (six) hours as needed for mild pain (pain score 1-3 or temp > 100.5). 03/07/20   Nolberto Hanlon, MD  alum & mag hydroxide-simeth (MAALOX/MYLANTA) 200-200-20 MG/5ML suspension Take 30 mLs by mouth every 4 (four) hours as needed for indigestion. 03/07/20   Nolberto Hanlon, MD  bisacodyl (DULCOLAX) 10 MG suppository Place 1 suppository (10 mg total) rectally daily as needed for moderate constipation. 03/07/20   Nolberto Hanlon, MD  fluticasone (FLONASE) 50 MCG/ACT nasal spray Place 2 sprays into both nostrils daily. Use for 4-6 weeks then stop and use seasonally or as needed. Patient taking differently: Place 2 sprays into both nostrils as directed. Use for 4-6 weeks then stop and use seasonally or as needed. 11/09/19   Parks Ranger, [name], DO  LUBRICATING  PLUS EYE DROPS 0.5 % SOLN Apply 1 application to eye as needed (dry eyes).  08/25/19   [provider]  magnesium citrate SOLN Take 296 mLs (1 Bottle total) by mouth once as needed for severe constipation. 03/07/20   Nolberto Hanlon, MD  OXYGEN Inhale 2 L into the lungs at bedtime.    [provider]    Allergies Patient has no known allergies.  Family History  Problem Relation Age of Onset  . Colon cancer Mother 56  . Thrombosis Father 65  . Heart disease Father        Heart valve problem  . AAA (abdominal aortic aneurysm) Father   . Hypertension Sister   . Hyperlipidemia Sister   . Hypertension Sister   . Hyperlipidemia Sister   . Hypertension Sister   . Hyperlipidemia Sister     Social History Social History   Tobacco Use  . Smoking status: Former Smoker    Packs/day: 1.00    Years: 30.00    Pack years: 30.00    Types: Cigarettes    Quit date:  1995    Years since quitting: 26.9  . Smokeless tobacco: Never Used  Vaping Use  . Vaping Use: Never used  Substance Use Topics  . Alcohol use: Not Currently    Comment: occasionally wine   . Drug use: No    Review of Systems  Review of Systems  Constitutional: Positive for fatigue. Negative for chills and fever.  HENT: Negative for sore throat.   Respiratory: Positive for cough and shortness of breath.   Cardiovascular: Negative for chest pain.  Gastrointestinal: Negative for abdominal pain.  Musculoskeletal: Positive for gait problem. Negative for neck pain.  Skin: Negative for rash and wound.  Allergic/Immunologic: Negative for immunocompromised state.  Neurological: Positive for weakness. Negative for numbness.  Hematological: Does not bruise/bleed easily.  All other systems reviewed and are negative.    ____________________________________________  PHYSICAL EXAM:      VITAL SIGNS: ED Triage Vitals  Enc Vitals Group     BP 03/03/2020 0741 (!) 150/78     Pulse Rate 02/23/2020 0741 (!) 120     Resp 03/17/2020 0750 (!) 30     Temp 03/11/2020 0750 97.6 F (36.4 C)     Temp Source 03/18/2020 0750 Axillary     SpO2 02/29/2020 0741 97 %     Weight 02/22/2020 0735 205 lb (93 kg)     Height 03/17/2020 0735 5\' 10"  (1.778 m)     Head Circumference --      Peak Flow --      Pain Score 03/07/2020 0750 0     Pain Loc --      Pain Edu? --      Excl. in [place]? --      Physical Exam Vitals and nursing note reviewed.  Constitutional:      General: He is not in acute distress.    Appearance: He is well-developed.  HENT:     Head: Normocephalic and atraumatic.  Eyes:     Conjunctiva/sclera: Conjunctivae normal.  Neck:     Vascular: JVD present.  Cardiovascular:     Rate and Rhythm: Regular rhythm. Tachycardia present.     Heart sounds: Normal heart sounds. No murmur heard.  No friction rub.  Pulmonary:     Effort: Pulmonary effort is normal. Tachypnea present. No respiratory distress.       Breath sounds: Examination of the right-upper field reveals rales. Examination of the  left-upper field reveals rales. Examination of the right-middle field reveals rales. Examination of the left-middle field reveals rales. Examination of the right-lower field reveals rales. Examination of the left-lower field reveals rales. Decreased breath sounds and rales present. No wheezing.  Abdominal:     General: There is no distension.     Palpations: Abdomen is soft.     Tenderness: There is no abdominal tenderness.  Musculoskeletal:     Cervical back: Neck supple.  Skin:    General: Skin is warm.     Capillary Refill: Capillary refill takes less than 2 seconds.  Neurological:     Mental Status: He is alert and oriented to person, place, and time.     Motor: No abnormal muscle tone.       ____________________________________________   LABS (all labs ordered are listed, but only abnormal results are displayed)  Labs Reviewed  CBC WITH DIFFERENTIAL/PLATELET - Abnormal; Notable for the following components:      Result Value   WBC 15.8 (*)    RBC 3.29 (*)    Hemoglobin 10.4 (*)    HCT 33.0 (*)    MCV 100.3 (*)    RDW 23.8 (*)    nRBC 1.1 (*)    Neutro Abs 13.6 (*)    Monocytes Absolute 1.1 (*)    Abs Immature Granulocytes 0.39 (*)    All other components within normal limits  BRAIN NATRIURETIC PEPTIDE - Abnormal; Notable for the following components:   B Natriuretic Peptide 1,276.9 (*)    All other components within normal limits  LACTIC ACID, PLASMA - Abnormal; Notable for the following components:   Lactic Acid, Venous 3.3 (*)    All other components within normal limits  LACTIC ACID, PLASMA - Abnormal; Notable for the following components:   Lactic Acid, Venous 2.9 (*)    All other components within normal limits  COMPREHENSIVE METABOLIC PANEL - Abnormal; Notable for the following components:   Sodium 132 (*)    Chloride 94 (*)    Glucose, Bld 128 (*)    Total Protein  6.3 (*)    Albumin 3.1 (*)    AST 57 (*)    Total Bilirubin 3.2 (*)    All other components within normal limits  TROPONIN I (HIGH SENSITIVITY) - Abnormal; Notable for the following components:   Troponin I (High Sensitivity) 159 (*)    All other components within normal limits  RESP PANEL BY RT-PCR (FLU A&B, COVID) ARPGX2  CULTURE, BLOOD (ROUTINE X 2)  CULTURE, BLOOD (ROUTINE X 2)  PROCALCITONIN  TROPONIN I (HIGH SENSITIVITY)    ____________________________________________  EKG: Suspected ectopic atrial tachycardia versus sinus tachycardia.  Ventricular rate 119.  QRS 140, QTc 468.  No acute ST elevations or depressions.  Nonspecific T wave changes diffusely. ________________________________________  RADIOLOGY All imaging, including plain films, CT scans, and ultrasounds, independently reviewed by me, and interpretations confirmed via formal radiology reads.  ED MD interpretation:   Chest x-ray: Significant bilateral CHF with multifocal consolidations  Official radiology report(s): DG Chest Portable 1 View  Result Date: 03/10/2020 CLINICAL DATA:  Short of breath. EXAM: PORTABLE CHEST 1 VIEW COMPARISON:  03/02/20 FINDINGS: Stable cardiomediastinal contours. Aortic atherosclerosis. Increase in bilateral pleural effusions and interstitial edema. New bilateral airspace opacities are noted particularly involving the upper lobes. IMPRESSION: 1. Worsening CHF pattern. 2. New bilateral airspace opacities concerning for multifocal infection. Electronically Signed   By: Kerby Moors M.D.   On: 03/13/2020 08:24  ____________________________________________  PROCEDURES   Procedure(s) performed (including Critical Care):  .Critical Care Performed by: Duffy Bruce, MD Authorized by: Duffy Bruce, MD   Critical care provider statement:    Critical care time (minutes):  35   Critical care time was exclusive of:  Separately billable procedures and treating other patients and  teaching time   Critical care was necessary to treat or prevent imminent or life-threatening deterioration of the following conditions:  Cardiac failure, circulatory failure and sepsis   Critical care was time spent personally by me on the following activities:  Development of treatment plan with patient or surrogate, discussions with consultants, evaluation of patient's response to treatment, examination of patient, obtaining history from patient or surrogate, ordering and performing treatments and interventions, ordering and review of laboratory studies, ordering and review of radiographic studies, pulse oximetry, re-evaluation of patient's condition and review of old charts   I assumed direction of critical care for this patient from another provider in my specialty: no      ____________________________________________  INITIAL IMPRESSION / MDM / [place] / ED COURSE  As part of my medical decision making, I reviewed the following data within the [place] notes reviewed and incorporated, Old chart reviewed, Notes from prior ED visits, and [place] Controlled Substance Database       *Raymond Barnett was evaluated in Emergency Department on 03/04/2020 for the symptoms described in the history of present illness. He was evaluated in the context of the global COVID-19 pandemic, which necessitated consideration that the patient might be at risk for infection with the SARS-CoV-2 virus that causes COVID-19. Institutional protocols and algorithms that pertain to the evaluation of patients at risk for COVID-19 are in a state of rapid change based on information released by regulatory bodies including the CDC and federal and state organizations. These policies and algorithms were followed during the patient's care in the ED.  Some ED evaluations and interventions may be delayed as a result of limited staffing during the pandemic.*     Medical Decision Making: 87 year old  male here with shortness of breath.  On arrival, patient is hypoxic in moderate respiratory distress.  Clinically, he appears hypervolemic with bilateral rails and increased work of breathing.  He is DNR.  Clinically, primary suspicion is CHF.  Chest x-ray obtained and reviewed by me, shows likely severe diffuse, asymmetric edema.  Less likely atypical infection.  His Covid is negative.  Patient does have a moderate leukocytosis on lab work, as well as slight lactic acid elevation.  However, I suspect this could be related to his work of breathing and possible underlying congestive hepatopathy as he also has an elevated bilirubin.  Abdomen is soft.  BNP and troponin are both elevated consistent with CHF.  Given that he is DNR, I feel his respiratory status takes priority at this time, so he was given Lasix as well as placed on high flow nasal cannula as he did not tolerate a mask well for BiPAP.  Will plan to admit.  Given his recent surgery, feels reasonable to obtain a CT angio and will extend this to the abdomen given his bilirubin elevation.  Patient seems to be improving after Lasix, lactic acid is downtrending, and his work of breathing is improved. Given his leukocytosis and LA elevation, empiric ABX started as well for possible sepsis, though clinically this seems less likely. Operative site appears c/d//i.  ____________________________________________  FINAL CLINICAL IMPRESSION(S) / ED DIAGNOSES  Final  diagnoses:  Acute on chronic systolic congestive heart failure (HCC)  Acute respiratory failure with hypoxia ([place])     MEDICATIONS GIVEN DURING THIS VISIT:  Medications  vancomycin (VANCOREADY) IVPB 2000 mg/400 mL (2,000 mg Intravenous New Bag/Given 03/08/2020 1023)  aspirin chewable tablet 81 mg (has no administration in time range)  traZODone (DESYREL) tablet 100 mg (has no administration in time range)  alum & mag hydroxide-simeth (MAALOX/MYLANTA) 200-200-20 MG/5ML suspension 30 mL (has no  administration in time range)  bisacodyl (DULCOLAX) suppository 10 mg (has no administration in time range)  docusate sodium (COLACE) capsule 100 mg (has no administration in time range)  pantoprazole (PROTONIX) EC tablet 40 mg (has no administration in time range)  polyethylene glycol (MIRALAX / GLYCOLAX) packet 17 g (has no administration in time range)  finasteride (PROSCAR) tablet 5 mg (has no administration in time range)  enoxaparin (LOVENOX) injection 40 mg (has no administration in time range)  methocarbamol (ROBAXIN) tablet 500 mg (has no administration in time range)  Oyster Shell Calcium/D 500-5 MG-MCG TABS 1 tablet (has no administration in time range)  cholecalciferol (VITAMIN D) tablet 1,000 Units (has no administration in time range)  fluticasone (FLONASE) 50 MCG/ACT nasal spray 2 spray (has no administration in time range)  Carboxymethylcellulose Sod PF 0.5 % SOLN 1 application (has no administration in time range)  timolol (TIMOPTIC) 0.5 % ophthalmic solution 1 drop (has no administration in time range)  vancomycin (VANCOREADY) IVPB 1250 mg/250 mL (has no administration in time range)  ceFEPIme (MAXIPIME) 2 g in sodium chloride 0.9 % 100 mL IVPB (has no administration in time range)  acetaminophen (TYLENOL) tablet 650 mg (has no administration in time range)    Or  acetaminophen (TYLENOL) suppository 650 mg (has no administration in time range)  ondansetron (ZOFRAN) tablet 4 mg (has no administration in time range)    Or  ondansetron (ZOFRAN) injection 4 mg (has no administration in time range)  ipratropium-albuterol (DUONEB) 0.5-2.5 (3) MG/3ML nebulizer solution 3 mL (has no administration in time range)  iohexol (OMNIPAQUE) 350 MG/ML injection 75 mL (has no administration in time range)  furosemide (LASIX) injection 40 mg (40 mg Intravenous Given 03/14/2020 0829)  ipratropium-albuterol (DUONEB) 0.5-2.5 (3) MG/3ML nebulizer solution 3 mL (3 mLs Nebulization Given 03/05/2020  0922)  ceFEPIme (MAXIPIME) 2 g in sodium chloride 0.9 % 100 mL IVPB (0 g Intravenous Stopped 03/02/2020 0951)     ED Discharge Orders    None       Note:  This document was prepared using Dragon voice recognition software and may include unintentional dictation errors.   Duffy Bruce, MD 02/26/2020 1122

## 2020-03-16 NOTE — Consult Note (Signed)
Cardiology Consultation:   Patient ID: BRIANNA ESSON MRN: [id number]; DOB: December 19, 1929  Admit date: 03/12/2020 Date of Consult: 03/11/2020  Primary Care Provider: [name], DO [facility] Cardiologist: Nelva Bush, MD  [facility] Electrophysiologist:  None    Patient Profile:   CHUCK CABAN is a 86 y.o. male with a hx of heart failure preserved ejection fraction, COPD, who is being seen today for the evaluation of shortness of breath at the request of Dr. Francine Graven.  History of Present Illness:   Mr. Ing is an 86 year old gentleman with history of HFpEF, COPD, PAD, hypertensionn who presents due to a 2-day history of worsening shortness of breath and cough.  He was recently admitted to the hospital for hip surgery.  Upon discharge he went to rehab facility.  States doing okay initially, but subsequently developed worsening shortness of breath, and noticed some swelling in his legs.  He also endorses cough with occasional sputum production.  Denies fevers or chills.  Denies palpitations.  He called EMS and was brought to the ED.  In the ED, EKG showed sinus tachycardia, chest x-ray showed multifocal pneumonia.  Chest CT showed bilateral pleural effusions, congestion, consistent with CHF/pneumonia.  BNP was 1276. patient started on IV Lasix and IV antibiotics.  He is currently on high flow nasal cannula.  Troponins were 159, 244.  Lactic acid was elevated at 3.3, white counts were elevated.   Past Medical History:  Diagnosis Date  . (HFpEF) heart failure with improved ejection fraction ([place])    a. 08/2017 Echo: EF 45-50%; b. 10/2019 Echo: EF 55-60%. No rwma, mild LVH, Gr2 DD, RVSP 47.53mmHg. Mod dil LA. Mod MR. Asc Ao 90mm.  . Arthritis   . Benign prostate hyperplasia    lower urinary tract symptoms  . Cataracts, bilateral   . COPD (chronic obstructive pulmonary disease) ([place])   . DDD (degenerative disc disease), lumbar   . Glaucoma   . H/O carpal tunnel  syndrome   . History of osteomyelitis 1953   right leg  . Hypertension   . Moderate mitral regurgitation    a. 10/2019 Echo: Mod MR.  Marland Kitchen PAD (peripheral artery disease) ([place])    a. 08/2017 LE ABI & Duplex: ABI R 0.60, L 1.07. 30-49% R popliteal and 50-74% R PT stenoses.  . Thrombocytopenia ([place])     Past Surgical History:  Procedure Laterality Date  . CARPAL TUNNEL RELEASE Left   . CARPAL TUNNEL RELEASE Right   . COLONOSCOPY  2015   Dr. Allen Norris  . HIP ARTHROPLASTY Right 03/03/2020   Procedure: ARTHROPLASTY BIPOLAR HIP (HEMIARTHROPLASTY);  Surgeon: Hessie Knows, MD;  Location: [facility] ORS;  Service: Orthopedics;  Laterality: Right;  . LEG SURGERY Right 1953  . SKIN GRAFT Right 05/01/2017   right foot at [facility]  . TOTAL KNEE ARTHROPLASTY Right      Home Medications:  Prior to Admission medications   Medication Sig Start Date End Date Taking? Authorizing Provider  ASPIRIN LOW DOSE 81 MG chewable tablet Chew 81 mg by mouth daily. 07/17/19  Yes [provider]  Calcium Carb-Cholecalciferol (OYSTER SHELL CALCIUM/D) 500-5 MG-MCG TABS Take 1 tablet by mouth daily. 03/07/20  Yes Nolberto Hanlon, MD  cholecalciferol (VITAMIN D) 25 MCG (1000 UNIT) tablet Take 1,000 Units by mouth daily. 12/15/19  Yes [provider]  docusate sodium (COLACE) 100 MG capsule Take 1 capsule (100 mg total) by mouth 2 (two) times daily. 03/07/20  Yes Nolberto Hanlon, MD  enoxaparin (LOVENOX)  40 MG/0.4ML injection Inject 0.4 mLs (40 mg total) into the skin daily for 14 days. 03/07/20 03/21/20 Yes Duanne Guess, PA-C  finasteride (PROSCAR) 5 MG tablet Take 5 mg by mouth daily.   Yes [provider]  furosemide (LASIX) 20 MG tablet Take 1 tablet (20 mg total) by mouth daily. 12/30/19 03/29/20 Yes End, [name], MD  HYDROcodone-acetaminophen (NORCO/VICODIN) 5-325 MG tablet Take 1-2 tablets by mouth every 4 (four) hours as needed for moderate pain (pain score 4-6). 03/06/20  Yes Duanne Guess, PA-C    methocarbamol (ROBAXIN) 500 MG tablet Take 1 tablet (500 mg total) by mouth every 6 (six) hours as needed for muscle spasms. 03/07/20  Yes Nolberto Hanlon, MD  pantoprazole (PROTONIX) 40 MG tablet Take 1 tablet (40 mg total) by mouth daily. 03/07/20  Yes Nolberto Hanlon, MD  polyethylene glycol (MIRALAX / GLYCOLAX) 17 g packet Take 17 g by mouth daily. 03/07/20  Yes Nolberto Hanlon, MD  timolol (TIMOPTIC) 0.5 % ophthalmic solution Place 1 drop into both eyes 2 (two) times daily.    Yes [provider]  traZODone (DESYREL) 100 MG tablet Take 1 tablet (100 mg total) by mouth at bedtime. 12/22/19  Yes [name], [name], DO  acetaminophen (TYLENOL) 325 MG tablet Take 1-2 tablets (325-650 mg total) by mouth every 6 (six) hours as needed for mild pain (pain score 1-3 or temp > 100.5). 03/07/20   Nolberto Hanlon, MD  alum & mag hydroxide-simeth (MAALOX/MYLANTA) 200-200-20 MG/5ML suspension Take 30 mLs by mouth every 4 (four) hours as needed for indigestion. 03/07/20   Nolberto Hanlon, MD  bisacodyl (DULCOLAX) 10 MG suppository Place 1 suppository (10 mg total) rectally daily as needed for moderate constipation. 03/07/20   Nolberto Hanlon, MD  fluticasone (FLONASE) 50 MCG/ACT nasal spray Place 2 sprays into both nostrils daily. Use for 4-6 weeks then stop and use seasonally or as needed. Patient taking differently: Place 2 sprays into both nostrils as directed. Use for 4-6 weeks then stop and use seasonally or as needed. 11/09/19   [name], [name], DO  LUBRICATING PLUS EYE DROPS 0.5 % SOLN Apply 1 application to eye as needed (dry eyes).  08/25/19   [provider]  magnesium citrate SOLN Take 296 mLs (1 Bottle total) by mouth once as needed for severe constipation. 03/07/20   Nolberto Hanlon, MD  OXYGEN Inhale 2 L into the lungs at bedtime.    [provider]    Inpatient Medications: Scheduled Meds: . aspirin  81 mg Oral Daily  . calcium-vitamin D  1 tablet Oral Daily  .  cholecalciferol  1,000 Units Oral Daily  . docusate sodium  100 mg Oral BID  . enoxaparin  40 mg Subcutaneous Q24H  . finasteride  5 mg Oral Daily  . furosemide  40 mg Intravenous Q12H  . pantoprazole  40 mg Oral Daily  . polyethylene glycol  17 g Oral QHS  . timolol  1 drop Both Eyes BID  . traZODone  100 mg Oral QHS   Continuous Infusions: . ceFEPime (MAXIPIME) IV 2 g (03/10/2020 1309)  . vancomycin     PRN Meds: acetaminophen **OR** acetaminophen, alum & mag hydroxide-simeth, bisacodyl, fluticasone, ipratropium-albuterol, methocarbamol, ondansetron **OR** ondansetron (ZOFRAN) IV, polyvinyl alcohol  Allergies:   No Known Allergies  Social History:   Social History   Socioeconomic History  . Marital status: Married    Spouse name: Not on file  . Number of children: Not on file  .  Years of education: Not on file  . Highest education level: High school graduate  Occupational History  . Not on file  Tobacco Use  . Smoking status: Former Smoker    Packs/day: 1.00    Years: 30.00    Pack years: 30.00    Types: Cigarettes    Quit date: 1995    Years since quitting: 26.9  . Smokeless tobacco: Never Used  Vaping Use  . Vaping Use: Never used  Substance and Sexual Activity  . Alcohol use: Not Currently    Comment: occasionally wine   . Drug use: No  . Sexual activity: Not Currently  Other Topics Concern  . Not on file  Social History Narrative  . Not on file   Social Determinants of Health   Financial Resource Strain:   . Difficulty of Paying Living Expenses: Not on file  Food Insecurity:   . Worried About Charity fundraiser in the Last Year: Not on file  . Ran Out of Food in the Last Year: Not on file  Transportation Needs:   . Lack of Transportation (Medical): Not on file  . Lack of Transportation (Non-Medical): Not on file  Physical Activity:   . Days of Exercise per Week: Not on file  . Minutes of Exercise per Session: Not on file  Stress:   . Feeling of  Stress : Not on file  Social Connections:   . Frequency of Communication with Friends and Family: Not on file  . Frequency of Social Gatherings with Friends and Family: Not on file  . Attends Religious Services: Not on file  . Active Member of Clubs or Organizations: Not on file  . Attends Archivist Meetings: Not on file  . Marital Status: Not on file  Intimate Partner Violence:   . Fear of Current or Ex-Partner: Not on file  . Emotionally Abused: Not on file  . Physically Abused: Not on file  . Sexually Abused: Not on file    Family History:    Family History  Problem Relation Age of Onset  . Colon cancer Mother 13  . Thrombosis Father 24  . Heart disease Father        Heart valve problem  . AAA (abdominal aortic aneurysm) Father   . Hypertension Sister   . Hyperlipidemia Sister   . Hypertension Sister   . Hyperlipidemia Sister   . Hypertension Sister   . Hyperlipidemia Sister      ROS:  Please see the history of present illness.   All other ROS reviewed and negative.     Physical Exam/Data:   Vitals:   02/23/2020 1030 02/22/2020 1100 03/03/2020 1220 03/19/2020 1238  BP: 127/75 129/68 (!) 141/69   Pulse: (!) 114 (!) 106 (!) 111   Resp: (!) 25 (!) 23 (!) 29   Temp:      TempSrc:      SpO2: 98% 99% 99% 97%  Weight:      Height:        Intake/Output Summary (Last 24 hours) at 03/05/2020 1402 Last data filed at 03/10/2020 1200 Gross per 24 hour  Intake 467.69 ml  Output 700 ml  Net -232.31 ml   Last 3 Weights 03/18/2020 03/06/2020 03/05/2020  Weight (lbs) 205 lb 225 lb 12 oz 222 lb 7.1 oz  Weight (kg) 92.987 kg 102.4 kg 100.9 kg     Body mass index is 29.41 kg/m.  General:  Well nourished, well developed, in no acute distress  HEENT: normal Lymph: no adenopathy Neck: no JVD Endocrine:  No thryomegaly Vascular: No carotid bruits; FA pulses 2+ bilaterally without bruits  Cardiac:  normal S1, S2; RRR; no murmur  Lungs: Decreased breath sounds, mild  crackles at bases Abd: soft, nontender, no hepatomegaly  Ext: Trace edema Musculoskeletal:  No deformities, BUE and BLE strength normal and equal Skin: warm and dry  Neuro:  CNs 2-12 intact, no focal abnormalities noted Psych:  Normal affect   EKG:  The EKG was personally reviewed and demonstrates: Sinus tachycardia, right bundle branch block Telemetry:  Telemetry was personally reviewed and demonstrates: Sinus tachycardia  Relevant CV Studies:   Echo 11/11/2019   1. Left ventricular ejection fraction, by estimation, is 55 to 60%. The  left ventricle has normal function. The left ventricle has no regional  wall motion abnormalities. There is mild left ventricular hypertrophy.  Left ventricular diastolic parameters  are consistent with Grade II diastolic dysfunction (pseudonormalization).  2. Right ventricular systolic function is normal. The right ventricular  size is normal. There is moderately elevated pulmonary artery systolic  pressure. The estimated right ventricular systolic pressure is 03.5 mmHg.  3. Left atrial size was moderately dilated.  4. Moderate mitral valve regurgitation.  5. There is mild dilatation of the ascending aorta measuring 41 mm  Laboratory Data:  High Sensitivity Troponin:   Recent Labs  Lab 03/03/20 0020 03/14/2020 0753 03/11/2020 1046  TROPONINIHS 49* 159* 244*     Chemistry Recent Labs  Lab 02/21/2020 0920  NA 132*  K 4.2  CL 94*  CO2 26  GLUCOSE 128*  BUN 18  CREATININE 0.82  CALCIUM 9.0  GFRNONAA >60  ANIONGAP 12    Recent Labs  Lab 02/21/2020 0920  PROT 6.3*  ALBUMIN 3.1*  AST 57*  ALT 35  ALKPHOS 88  BILITOT 3.2*   Hematology Recent Labs  Lab 03/02/2020 0753  WBC 15.8*  RBC 3.29*  HGB 10.4*  HCT 33.0*  MCV 100.3*  MCH 31.6  MCHC 31.5  RDW 23.8*  PLT 275   BNP Recent Labs  Lab 02/23/2020 0753  BNP 1,276.9*    DDimer No results for input(s): DDIMER in the last 168 hours.   Radiology/Studies:  CT Angio Chest  PE W and/or Wo Contrast  Result Date: 03/09/2020 CLINICAL DATA:  Clinical high probability for pulmonary embolus. Shortness of breath. Abdominal distension and jaundice. EXAM: CT ANGIOGRAPHY CHEST CT ABDOMEN AND PELVIS WITH CONTRAST TECHNIQUE: Multidetector CT imaging of the chest was performed using the standard protocol during bolus administration of intravenous contrast. Multiplanar CT image reconstructions and MIPs were obtained to evaluate the vascular anatomy. Multidetector CT imaging of the abdomen and pelvis was performed using the standard protocol during bolus administration of intravenous contrast. CONTRAST:  53mL OMNIPAQUE IOHEXOL 350 MG/ML SOLN COMPARISON:  CT chest 05/19/2017 abdominal sonogram 11/12/2011 FINDINGS: CTA CHEST FINDINGS Cardiovascular: Satisfactory opacification of the pulmonary arteries to the segmental level. Respiratory motion artifact however diminishes exam detail within the lower lobe pulmonary arteries at the segmental and subsegmental levels. The main pulmonary artery is patent. No central obstructing embolus. No lobar or segmental pulmonary artery filling defects identified bilaterally. The ascending thoracic aorta appears increased in caliber measuring 4.2 cm. Aortic atherosclerosis and coronary artery calcifications. Mild cardiac enlargement. No pericardial effusion. Mediastinum/Nodes: Normal appearance of the thyroid gland. The trachea appears patent and is midline. Normal appearance of the esophagus. No enlarged axillary, supraclavicular or mediastinal lymph nodes. Prominent prevascular and right paratracheal nodes are  identified measuring up to 1.3 cm. Lungs/Pleura: Moderate bilateral pleural effusions are identified. Extensive interstitial and airspace disease is noted within the right upper lobe, left upper lobe, lingula and right middle lobe. Subsegmental atelectasis and volume loss noted within both lower lung zones. Musculoskeletal: The bones appear diffusely  osteopenic. No acute or suspicious osseous findings. Review of the MIP images confirms the above findings. CT ABDOMEN and PELVIS FINDINGS Hepatobiliary: No suspicious liver abnormality. Mural calcifications are identified within the gallbladder fundus, image 27/4. No bile duct dilatation. Pancreas: Unremarkable. No pancreatic ductal dilatation or surrounding inflammatory changes. Spleen: Normal in size without focal abnormality. Adrenals/Urinary Tract: Normal adrenal glands. No hydronephrosis identified bilaterally. Bilateral kidney cysts are identified. The largest arises from the inferior pole of right kidney measuring 8.5 cm. There is a hyper dense lesion arising from the anterior cortex of the upper pole of right kidney measuring 0.7 by 0.9 cm, image 28/4. Additional, subcentimeter kidney lesions are noted bilaterally which are too small to reliably characterize. No hydronephrosis identified bilaterally. No hydroureter or ureteral lithiasis. Stomach/Bowel: Stomach is nondistended. The appendix is visualized and appears normal. No abnormal bowel dilatation, inflammation or distension. Distal colonic diverticula identified without acute inflammation. Moderate stool burden noted throughout the colon Vascular/Lymphatic: Aortic atherosclerosis. No aneurysm. No abdominopelvic adenopathy. Reproductive: Prostate gland is obscured by streak artifact from right hip arthroplasty. Other: No significant free fluid or fluid collections. Musculoskeletal: Bilateral sacroiliitis noted, right greater than left. Previous right hip arthroplasty. Multi level degenerative disc disease is noted throughout the lumbar spine. This is most advanced at L5-S1. Acute appearing superior endplate deformity is noted at the L2 level. With loss of approximately 15% of the vertebral body height centrally. Review of the MIP images confirms the above findings. IMPRESSION: 1. No evidence for acute pulmonary embolus. 2. Bilateral pleural effusions  with extensive bilateral interstitial and airspace disease identified. Findings are concerning for congestive heart failure. Superimposed multifocal pneumonia not excluded. 3. No acute findings within the abdomen or pelvis. 4. Mild acute appearing superior endplate deformity at L2. 5. There is a hyperdense lesion arising off the anterior cortex of the left kidney which does not meet criteria for simple cyst. This may represent a hemorrhagic cyst or solid enhancing lesion. When the patient is clinically stable and able to follow directions and hold their breath (preferably as an outpatient) further evaluation with dedicated abdominal MRI should be considered. 6. Bilateral kidney cysts. Aortic Atherosclerosis (ICD10-I70.0). Electronically Signed   By: Kerby Moors M.D.   On: 03/02/2020 12:23   CT ABDOMEN PELVIS W CONTRAST  Result Date: 03/19/2020 CLINICAL DATA:  Clinical high probability for pulmonary embolus. Shortness of breath. Abdominal distension and jaundice. EXAM: CT ANGIOGRAPHY CHEST CT ABDOMEN AND PELVIS WITH CONTRAST TECHNIQUE: Multidetector CT imaging of the chest was performed using the standard protocol during bolus administration of intravenous contrast. Multiplanar CT image reconstructions and MIPs were obtained to evaluate the vascular anatomy. Multidetector CT imaging of the abdomen and pelvis was performed using the standard protocol during bolus administration of intravenous contrast. CONTRAST:  95mL OMNIPAQUE IOHEXOL 350 MG/ML SOLN COMPARISON:  CT chest 05/19/2017 abdominal sonogram 11/12/2011 FINDINGS: CTA CHEST FINDINGS Cardiovascular: Satisfactory opacification of the pulmonary arteries to the segmental level. Respiratory motion artifact however diminishes exam detail within the lower lobe pulmonary arteries at the segmental and subsegmental levels. The main pulmonary artery is patent. No central obstructing embolus. No lobar or segmental pulmonary artery filling defects identified  bilaterally. The ascending thoracic aorta appears  increased in caliber measuring 4.2 cm. Aortic atherosclerosis and coronary artery calcifications. Mild cardiac enlargement. No pericardial effusion. Mediastinum/Nodes: Normal appearance of the thyroid gland. The trachea appears patent and is midline. Normal appearance of the esophagus. No enlarged axillary, supraclavicular or mediastinal lymph nodes. Prominent prevascular and right paratracheal nodes are identified measuring up to 1.3 cm. Lungs/Pleura: Moderate bilateral pleural effusions are identified. Extensive interstitial and airspace disease is noted within the right upper lobe, left upper lobe, lingula and right middle lobe. Subsegmental atelectasis and volume loss noted within both lower lung zones. Musculoskeletal: The bones appear diffusely osteopenic. No acute or suspicious osseous findings. Review of the MIP images confirms the above findings. CT ABDOMEN and PELVIS FINDINGS Hepatobiliary: No suspicious liver abnormality. Mural calcifications are identified within the gallbladder fundus, image 27/4. No bile duct dilatation. Pancreas: Unremarkable. No pancreatic ductal dilatation or surrounding inflammatory changes. Spleen: Normal in size without focal abnormality. Adrenals/Urinary Tract: Normal adrenal glands. No hydronephrosis identified bilaterally. Bilateral kidney cysts are identified. The largest arises from the inferior pole of right kidney measuring 8.5 cm. There is a hyper dense lesion arising from the anterior cortex of the upper pole of right kidney measuring 0.7 by 0.9 cm, image 28/4. Additional, subcentimeter kidney lesions are noted bilaterally which are too small to reliably characterize. No hydronephrosis identified bilaterally. No hydroureter or ureteral lithiasis. Stomach/Bowel: Stomach is nondistended. The appendix is visualized and appears normal. No abnormal bowel dilatation, inflammation or distension. Distal colonic diverticula  identified without acute inflammation. Moderate stool burden noted throughout the colon Vascular/Lymphatic: Aortic atherosclerosis. No aneurysm. No abdominopelvic adenopathy. Reproductive: Prostate gland is obscured by streak artifact from right hip arthroplasty. Other: No significant free fluid or fluid collections. Musculoskeletal: Bilateral sacroiliitis noted, right greater than left. Previous right hip arthroplasty. Multi level degenerative disc disease is noted throughout the lumbar spine. This is most advanced at L5-S1. Acute appearing superior endplate deformity is noted at the L2 level. With loss of approximately 15% of the vertebral body height centrally. Review of the MIP images confirms the above findings. IMPRESSION: 1. No evidence for acute pulmonary embolus. 2. Bilateral pleural effusions with extensive bilateral interstitial and airspace disease identified. Findings are concerning for congestive heart failure. Superimposed multifocal pneumonia not excluded. 3. No acute findings within the abdomen or pelvis. 4. Mild acute appearing superior endplate deformity at L2. 5. There is a hyperdense lesion arising off the anterior cortex of the left kidney which does not meet criteria for simple cyst. This may represent a hemorrhagic cyst or solid enhancing lesion. When the patient is clinically stable and able to follow directions and hold their breath (preferably as an outpatient) further evaluation with dedicated abdominal MRI should be considered. 6. Bilateral kidney cysts. Aortic Atherosclerosis (ICD10-I70.0). Electronically Signed   By: Kerby Moors M.D.   On: 03/08/2020 12:23   DG Chest Portable 1 View  Result Date: 03/21/2020 CLINICAL DATA:  Short of breath. EXAM: PORTABLE CHEST 1 VIEW COMPARISON:  03/02/20 FINDINGS: Stable cardiomediastinal contours. Aortic atherosclerosis. Increase in bilateral pleural effusions and interstitial edema. New bilateral airspace opacities are noted particularly  involving the upper lobes. IMPRESSION: 1. Worsening CHF pattern. 2. New bilateral airspace opacities concerning for multifocal infection. Electronically Signed   By: Kerby Moors M.D.   On: 02/29/2020 08:24     Assessment and Plan:   1.  Heart failure preserved ejection fraction - IV Lasix 40 mg twice daily -Monitor creatinine, ins and outs, daily weights -Shortness of breath multifactorial in  light of pneumonia and COPD.  2.  Elevated troponins -Not consistent with ACS -Secondary to demand supply mismatch  3.  PAD -Aspirin -Patient previously self discontinued statin.  4.  Multifocal pneumonia, sepsis, COPD -On high flow nasal oxygen -IV antibiotics as per ICU team -Inhalers COPD management as per ICU team  Total encounter time 110 minutes  Greater than 50% was spent in counseling and coordination of care with the patient    Signed, Kate Sable, MD  03/21/2020 2:02 PM

## 2020-03-17 ENCOUNTER — Inpatient Hospital Stay (HOSPITAL_COMMUNITY)
Admit: 2020-03-17 | Discharge: 2020-03-17 | Disposition: A | Discharge status: Home / Self Care | Payer: Medicare Other | Attending: Internal Medicine | Admitting: Internal Medicine

## 2020-03-17 DIAGNOSIS — I5023 Acute on chronic systolic (congestive) heart failure: Secondary | ICD-10-CM | POA: Diagnosis not present

## 2020-03-17 DIAGNOSIS — I5031 Acute diastolic (congestive) heart failure: Secondary | ICD-10-CM

## 2020-03-17 DIAGNOSIS — J441 Chronic obstructive pulmonary disease with (acute) exacerbation: Secondary | ICD-10-CM | POA: Diagnosis not present

## 2020-03-17 DIAGNOSIS — I5033 Acute on chronic diastolic (congestive) heart failure: Secondary | ICD-10-CM | POA: Diagnosis not present

## 2020-03-17 DIAGNOSIS — J9621 Acute and chronic respiratory failure with hypoxia: Secondary | ICD-10-CM | POA: Diagnosis not present

## 2020-03-17 DIAGNOSIS — I4891 Unspecified atrial fibrillation: Secondary | ICD-10-CM

## 2020-03-17 LAB — CBC
HCT: 33.1 % — ABNORMAL LOW (ref 39.0–52.0)
Hemoglobin: 10.4 g/dL — ABNORMAL LOW (ref 13.0–17.0)
MCH: 31.6 pg (ref 26.0–34.0)
MCHC: 31.4 g/dL (ref 30.0–36.0)
MCV: 100.6 fL — ABNORMAL HIGH (ref 80.0–100.0)
Platelets: 239 10*3/uL (ref 150–400)
RBC: 3.29 MIL/uL — ABNORMAL LOW (ref 4.22–5.81)
RDW: 23.4 % — ABNORMAL HIGH (ref 11.5–15.5)
WBC: 15.4 10*3/uL — ABNORMAL HIGH (ref 4.0–10.5)
nRBC: 0.8 % — ABNORMAL HIGH (ref 0.0–0.2)

## 2020-03-17 LAB — BASIC METABOLIC PANEL
Anion gap: 14 (ref 5–15)
BUN: 24 mg/dL — ABNORMAL HIGH (ref 8–23)
CO2: 27 mmol/L (ref 22–32)
Calcium: 9.1 mg/dL (ref 8.9–10.3)
Chloride: 94 mmol/L — ABNORMAL LOW (ref 98–111)
Creatinine, Ser: 0.88 mg/dL (ref 0.61–1.24)
GFR, Estimated: >60 mL/min (ref >60)
Glucose, Bld: 111 mg/dL — ABNORMAL HIGH (ref 70–99)
Potassium: 4.1 mmol/L (ref 3.5–5.1)
Sodium: 135 mmol/L (ref 135–145)

## 2020-03-17 LAB — PROTIME-INR
INR: 1.2 (ref 0.8–1.2)
Prothrombin Time: 14.5 seconds (ref 11.4–15.2)

## 2020-03-17 LAB — ECHOCARDIOGRAM COMPLETE
AR max vel: 3.19 cm2
AV Area VTI: 4.48 cm2
AV Area mean vel: 3.26 cm2
AV Mean grad: 2.5 mmHg
AV Peak grad: 4.7 mmHg
Ao pk vel: 1.09 m/s
Area-P 1/2: 4.06 cm2
Height: 70 in
S' Lateral: 3.07 cm
Weight: 3280 oz

## 2020-03-17 LAB — CORTISOL-AM, BLOOD: Cortisol - AM: 54.2 ug/dL — ABNORMAL HIGH (ref 6.7–22.6)

## 2020-03-17 LAB — LACTIC ACID, PLASMA
Lactic Acid, Venous: 2.2 mmol/L (ref 0.5–1.9)
Lactic Acid, Venous: 2.4 mmol/L (ref 0.5–1.9)

## 2020-03-17 LAB — PROCALCITONIN: Procalcitonin: 0.22 ng/mL

## 2020-03-17 LAB — HEPARIN LEVEL (UNFRACTIONATED): Heparin Unfractionated: 0.68 IU/mL (ref 0.30–0.70)

## 2020-03-17 LAB — MRSA PCR SCREENING: MRSA by PCR: NEGATIVE

## 2020-03-17 MED ORDER — DILTIAZEM LOAD VIA INFUSION
10.0000 mg | Freq: Once | INTRAVENOUS | Status: AC
  Administered 2020-03-17: 10 mg via INTRAVENOUS
  Filled 2020-03-17: qty 10

## 2020-03-17 MED ORDER — CHLORHEXIDINE GLUCONATE CLOTH 2 % EX PADS
6.0000 | MEDICATED_PAD | Freq: Every day | CUTANEOUS | Status: DC
  Administered 2020-03-17 – 2020-03-24 (×8): 6 via TOPICAL

## 2020-03-17 MED ORDER — HEPARIN (PORCINE) 25000 UT/250ML-% IV SOLN
1050.0000 [IU]/h | INTRAVENOUS | Status: DC
  Administered 2020-03-17 – 2020-03-19 (×4): 1300 [IU]/h via INTRAVENOUS
  Administered 2020-03-20: 1050 [IU]/h via INTRAVENOUS
  Filled 2020-03-17 (×5): qty 250

## 2020-03-17 MED ORDER — HEPARIN BOLUS VIA INFUSION
4000.0000 [IU] | Freq: Once | INTRAVENOUS | Status: AC
  Administered 2020-03-17: 4000 [IU] via INTRAVENOUS
  Filled 2020-03-17: qty 4000

## 2020-03-17 MED ORDER — DILTIAZEM HCL-DEXTROSE 125-5 MG/125ML-% IV SOLN (PREMIX)
5.0000 mg/h | INTRAVENOUS | Status: DC
  Administered 2020-03-17: 5 mg/h via INTRAVENOUS
  Administered 2020-03-17: 10 mg/h via INTRAVENOUS
  Filled 2020-03-17 (×2): qty 125

## 2020-03-17 NOTE — Progress Notes (Addendum)
[facility] [place] NAME: Raymond Barnett    MR#:  [id number]  DATE OF BIRTH:  1929-07-19  SUBJECTIVE:  patient came in from [facility] with increasing shortness of breath and hypoxia. He was discharged on 16 November after hip fracture surgery.   Patient was found to have sats 82% on room air at [facility]. He was tachycardic.  Arrived in the emergency room on not rebreather mask. Found to be in congestive heart failure with acute hypoxic respiratory failure patient currently is on heated high flow nasal cannula oxygen 40% Fio2 ,45 liters/min REVIEW OF SYSTEMS:   Review of Systems  Constitutional: Positive for malaise/fatigue. Negative for chills, fever and weight loss.  HENT: Negative for ear discharge, ear pain and nosebleeds.   Eyes: Negative for blurred vision, pain and discharge.  Respiratory: Positive for shortness of breath. Negative for sputum production, wheezing and stridor.   Cardiovascular: Negative for chest pain, palpitations, orthopnea and PND.  Gastrointestinal: Negative for abdominal pain, diarrhea, nausea and vomiting.  Genitourinary: Negative for frequency and urgency.  Musculoskeletal: Negative for back pain and joint pain.  Neurological: Positive for weakness. Negative for sensory change, speech change and focal weakness.  Psychiatric/Behavioral: Negative for depression and hallucinations. The patient is not nervous/anxious.    Tolerating Diet:yes Tolerating PT: pending  DRUG ALLERGIES:  No Known Allergies  VITALS:  Blood pressure 99/70, pulse (!) 118, temperature 97.9 F (36.6 C), temperature source Oral, resp. rate 19, height 5\' 10"  (1.778 m), weight 93 kg, SpO2 92 %.  PHYSICAL EXAMINATION:   Physical Exam  GENERAL:  86 y.o.-year-old patient lying in the bed with mild acute resp  distress.  HEENT: Head atraumatic, normocephalic. Oropharynx and nasopharynx clear.  HFNC +  LUNGS: decreasedbreath  sounds bilaterally, no wheezing, bibasilar rales, no rhonchi. No use of accessory muscles of respiration.  CARDIOVASCULAR: S1, S2 normal. No murmurs, rubs, or gallops. tachycardia ABDOMEN: Soft, nontender, nondistended. Bowel sounds present. No organomegaly or mass.  EXTREMITIES: No cyanosis, clubbing or edema b/l.    NEUROLOGIC: Cranial nerves II through XII are intact. No focal Motor or sensory deficits b/l.  weak PSYCHIATRIC:  patient is alert and oriented x 3.  SKIN: No obvious rash, lesion, or ulcer.        LABORATORY PANEL:  CBC Recent Labs  Lab 03/17/20 0624  WBC 15.4*  HGB 10.4*  HCT 33.1*  PLT 239    Chemistries  Recent Labs  Lab 03/15/2020 0920 03/03/2020 0920 03/17/20 0624  NA 132*   < > 135  K 4.2   < > 4.1  CL 94*   < > 94*  CO2 26   < > 27  GLUCOSE 128*   < > 111*  BUN 18   < > 24*  CREATININE 0.82   < > 0.88  CALCIUM 9.0   < > 9.1  AST 57*  --   --   ALT 35  --   --   ALKPHOS 88  --   --   BILITOT 3.2*  --   --    < > = values in this interval not displayed.   Cardiac Enzymes No results for input(s): TROPONINI in the last 168 hours. RADIOLOGY:  CT Angio Chest PE W and/or Wo Contrast  Result Date: 03/14/2020 CLINICAL DATA:  Clinical high probability for pulmonary embolus. Shortness of breath. Abdominal distension and jaundice. EXAM: CT ANGIOGRAPHY CHEST CT ABDOMEN AND PELVIS WITH CONTRAST  TECHNIQUE: Multidetector CT imaging of the chest was performed using the standard protocol during bolus administration of intravenous contrast. Multiplanar CT image reconstructions and MIPs were obtained to evaluate the vascular anatomy. Multidetector CT imaging of the abdomen and pelvis was performed using the standard protocol during bolus administration of intravenous contrast. CONTRAST:  12mL OMNIPAQUE IOHEXOL 350 MG/ML SOLN COMPARISON:  CT chest 05/19/2017 abdominal sonogram 11/12/2011 FINDINGS: CTA CHEST FINDINGS Cardiovascular: Satisfactory opacification of the  pulmonary arteries to the segmental level. Respiratory motion artifact however diminishes exam detail within the lower lobe pulmonary arteries at the segmental and subsegmental levels. The main pulmonary artery is patent. No central obstructing embolus. No lobar or segmental pulmonary artery filling defects identified bilaterally. The ascending thoracic aorta appears increased in caliber measuring 4.2 cm. Aortic atherosclerosis and coronary artery calcifications. Mild cardiac enlargement. No pericardial effusion. Mediastinum/Nodes: Normal appearance of the thyroid gland. The trachea appears patent and is midline. Normal appearance of the esophagus. No enlarged axillary, supraclavicular or mediastinal lymph nodes. Prominent prevascular and right paratracheal nodes are identified measuring up to 1.3 cm. Lungs/Pleura: Moderate bilateral pleural effusions are identified. Extensive interstitial and airspace disease is noted within the right upper lobe, left upper lobe, lingula and right middle lobe. Subsegmental atelectasis and volume loss noted within both lower lung zones. Musculoskeletal: The bones appear diffusely osteopenic. No acute or suspicious osseous findings. Review of the MIP images confirms the above findings. CT ABDOMEN and PELVIS FINDINGS Hepatobiliary: No suspicious liver abnormality. Mural calcifications are identified within the gallbladder fundus, image 27/4. No bile duct dilatation. Pancreas: Unremarkable. No pancreatic ductal dilatation or surrounding inflammatory changes. Spleen: Normal in size without focal abnormality. Adrenals/Urinary Tract: Normal adrenal glands. No hydronephrosis identified bilaterally. Bilateral kidney cysts are identified. The largest arises from the inferior pole of right kidney measuring 8.5 cm. There is a hyper dense lesion arising from the anterior cortex of the upper pole of right kidney measuring 0.7 by 0.9 cm, image 28/4. Additional, subcentimeter kidney lesions are  noted bilaterally which are too small to reliably characterize. No hydronephrosis identified bilaterally. No hydroureter or ureteral lithiasis. Stomach/Bowel: Stomach is nondistended. The appendix is visualized and appears normal. No abnormal bowel dilatation, inflammation or distension. Distal colonic diverticula identified without acute inflammation. Moderate stool burden noted throughout the colon Vascular/Lymphatic: Aortic atherosclerosis. No aneurysm. No abdominopelvic adenopathy. Reproductive: Prostate gland is obscured by streak artifact from right hip arthroplasty. Other: No significant free fluid or fluid collections. Musculoskeletal: Bilateral sacroiliitis noted, right greater than left. Previous right hip arthroplasty. Multi level degenerative disc disease is noted throughout the lumbar spine. This is most advanced at L5-S1. Acute appearing superior endplate deformity is noted at the L2 level. With loss of approximately 15% of the vertebral body height centrally. Review of the MIP images confirms the above findings. IMPRESSION: 1. No evidence for acute pulmonary embolus. 2. Bilateral pleural effusions with extensive bilateral interstitial and airspace disease identified. Findings are concerning for congestive heart failure. Superimposed multifocal pneumonia not excluded. 3. No acute findings within the abdomen or pelvis. 4. Mild acute appearing superior endplate deformity at L2. 5. There is a hyperdense lesion arising off the anterior cortex of the left kidney which does not meet criteria for simple cyst. This may represent a hemorrhagic cyst or solid enhancing lesion. When the patient is clinically stable and able to follow directions and hold their breath (preferably as an outpatient) further evaluation with dedicated abdominal MRI should be considered. 6. Bilateral kidney cysts. Aortic Atherosclerosis (ICD10-I70.0).  Electronically Signed   By: Kerby Moors M.D.   On: 02/21/2020 12:23   CT ABDOMEN  PELVIS W CONTRAST  Result Date: 02/22/2020 CLINICAL DATA:  Clinical high probability for pulmonary embolus. Shortness of breath. Abdominal distension and jaundice. EXAM: CT ANGIOGRAPHY CHEST CT ABDOMEN AND PELVIS WITH CONTRAST TECHNIQUE: Multidetector CT imaging of the chest was performed using the standard protocol during bolus administration of intravenous contrast. Multiplanar CT image reconstructions and MIPs were obtained to evaluate the vascular anatomy. Multidetector CT imaging of the abdomen and pelvis was performed using the standard protocol during bolus administration of intravenous contrast. CONTRAST:  50mL OMNIPAQUE IOHEXOL 350 MG/ML SOLN COMPARISON:  CT chest 05/19/2017 abdominal sonogram 11/12/2011 FINDINGS: CTA CHEST FINDINGS Cardiovascular: Satisfactory opacification of the pulmonary arteries to the segmental level. Respiratory motion artifact however diminishes exam detail within the lower lobe pulmonary arteries at the segmental and subsegmental levels. The main pulmonary artery is patent. No central obstructing embolus. No lobar or segmental pulmonary artery filling defects identified bilaterally. The ascending thoracic aorta appears increased in caliber measuring 4.2 cm. Aortic atherosclerosis and coronary artery calcifications. Mild cardiac enlargement. No pericardial effusion. Mediastinum/Nodes: Normal appearance of the thyroid gland. The trachea appears patent and is midline. Normal appearance of the esophagus. No enlarged axillary, supraclavicular or mediastinal lymph nodes. Prominent prevascular and right paratracheal nodes are identified measuring up to 1.3 cm. Lungs/Pleura: Moderate bilateral pleural effusions are identified. Extensive interstitial and airspace disease is noted within the right upper lobe, left upper lobe, lingula and right middle lobe. Subsegmental atelectasis and volume loss noted within both lower lung zones. Musculoskeletal: The bones appear diffusely osteopenic.  No acute or suspicious osseous findings. Review of the MIP images confirms the above findings. CT ABDOMEN and PELVIS FINDINGS Hepatobiliary: No suspicious liver abnormality. Mural calcifications are identified within the gallbladder fundus, image 27/4. No bile duct dilatation. Pancreas: Unremarkable. No pancreatic ductal dilatation or surrounding inflammatory changes. Spleen: Normal in size without focal abnormality. Adrenals/Urinary Tract: Normal adrenal glands. No hydronephrosis identified bilaterally. Bilateral kidney cysts are identified. The largest arises from the inferior pole of right kidney measuring 8.5 cm. There is a hyper dense lesion arising from the anterior cortex of the upper pole of right kidney measuring 0.7 by 0.9 cm, image 28/4. Additional, subcentimeter kidney lesions are noted bilaterally which are too small to reliably characterize. No hydronephrosis identified bilaterally. No hydroureter or ureteral lithiasis. Stomach/Bowel: Stomach is nondistended. The appendix is visualized and appears normal. No abnormal bowel dilatation, inflammation or distension. Distal colonic diverticula identified without acute inflammation. Moderate stool burden noted throughout the colon Vascular/Lymphatic: Aortic atherosclerosis. No aneurysm. No abdominopelvic adenopathy. Reproductive: Prostate gland is obscured by streak artifact from right hip arthroplasty. Other: No significant free fluid or fluid collections. Musculoskeletal: Bilateral sacroiliitis noted, right greater than left. Previous right hip arthroplasty. Multi level degenerative disc disease is noted throughout the lumbar spine. This is most advanced at L5-S1. Acute appearing superior endplate deformity is noted at the L2 level. With loss of approximately 15% of the vertebral body height centrally. Review of the MIP images confirms the above findings. IMPRESSION: 1. No evidence for acute pulmonary embolus. 2. Bilateral pleural effusions with extensive  bilateral interstitial and airspace disease identified. Findings are concerning for congestive heart failure. Superimposed multifocal pneumonia not excluded. 3. No acute findings within the abdomen or pelvis. 4. Mild acute appearing superior endplate deformity at L2. 5. There is a hyperdense lesion arising off the anterior cortex of the left kidney which  does not meet criteria for simple cyst. This may represent a hemorrhagic cyst or solid enhancing lesion. When the patient is clinically stable and able to follow directions and hold their breath (preferably as an outpatient) further evaluation with dedicated abdominal MRI should be considered. 6. Bilateral kidney cysts. Aortic Atherosclerosis (ICD10-I70.0). Electronically Signed   By: Kerby Moors M.D.   On: 03/15/2020 12:23   DG Chest Portable 1 View  Result Date: 03/02/2020 CLINICAL DATA:  Short of breath. EXAM: PORTABLE CHEST 1 VIEW COMPARISON:  03/02/20 FINDINGS: Stable cardiomediastinal contours. Aortic atherosclerosis. Increase in bilateral pleural effusions and interstitial edema. New bilateral airspace opacities are noted particularly involving the upper lobes. IMPRESSION: 1. Worsening CHF pattern. 2. New bilateral airspace opacities concerning for multifocal infection. Electronically Signed   By: Kerby Moors M.D.   On: 03/03/2020 08:24   ECHOCARDIOGRAM COMPLETE  Result Date: 03/17/2020    ECHOCARDIOGRAM REPORT   Patient Name:   Raymond Barnett [facility] Date of Exam: 03/17/2020 Medical Rec #:  [id number]      Height:       70.0 in Accession #:    [id number]     Weight:       205.0 lb Date of Birth:  10-24-1929      BSA:          2.109 m Patient Age:    21 years       BP:           133/74 mmHg Patient Gender: M              HR:           109 bpm. Exam Location:  [facility] Procedure: 2D Echo, Cardiac Doppler and Color Doppler Indications:     CHF- acute diastolic 761.95  History:         Patient has prior history of Echocardiogram examinations, most                   recent 11/11/2019. COPD and PAD; Risk Factors:Hypertension.                  Heart failure with improved ejection fraction Moderate                  mitral regurgitation.  Sonographer:     Sherrie Sport RDCS (AE) Referring Phys:  [phone] CHRISTOPHER END Diagnosing Phys: Kate Sable MD IMPRESSIONS  1. Left ventricular ejection fraction, by estimation, is 50 to 55%. The left ventricle has low normal function. The left ventricle has no regional wall motion abnormalities. There is moderate left ventricular hypertrophy. Left ventricular diastolic function could not be evaluated.  2. Right ventricular systolic function is low normal. The right ventricular size is mildly enlarged.  3. Left atrial size was mildly dilated.  4. Right atrial size was moderately dilated.  5. The mitral valve is degenerative. Trivial mitral valve regurgitation.  6. The aortic valve is tricuspid. Aortic valve regurgitation is mild. FINDINGS  Left Ventricle: Left ventricular ejection fraction, by estimation, is 50 to 55%. The left ventricle has low normal function. The left ventricle has no regional wall motion abnormalities. The left ventricular internal cavity size was normal in size. There is moderate left ventricular hypertrophy. Left ventricular diastolic function could not be evaluated. Right Ventricle: The right ventricular size is mildly enlarged. No increase in right ventricular wall thickness. Right ventricular systolic function is low normal. Left Atrium: Left atrial size was mildly dilated. Right Atrium: Right atrial  size was moderately dilated. Pericardium: There is no evidence of pericardial effusion. Mitral Valve: The mitral valve is degenerative in appearance. Mild mitral annular calcification. Trivial mitral valve regurgitation. Tricuspid Valve: The tricuspid valve is normal in structure. Tricuspid valve regurgitation is not demonstrated. Aortic Valve: The aortic valve is tricuspid. Aortic valve regurgitation is mild. Aortic  valve mean gradient measures 2.5 mmHg. Aortic valve peak gradient measures 4.7 mmHg. Aortic valve area, by VTI measures 4.48 cm. Pulmonic Valve: The pulmonic valve was normal in structure. Pulmonic valve regurgitation is not visualized. Aorta: The aortic root is normal in size and structure. IAS/Shunts: No atrial level shunt detected by color flow Doppler.  LEFT VENTRICLE PLAX 2D LVIDd:         4.11 cm LVIDs:         3.07 cm LV PW:         1.65 cm LV IVS:        1.77 cm LVOT diam:     2.10 cm LV SV:         68 LV SV Index:   32 LVOT Area:     3.46 cm  RIGHT VENTRICLE RV Basal diam:  4.51 cm LEFT ATRIUM           Index       RIGHT ATRIUM           Index LA diam:      4.00 cm 1.90 cm/m  RA Area:     30.40 cm LA Vol (A2C): 48.3 ml 22.90 ml/m RA Volume:   109.00 ml 51.67 ml/m LA Vol (A4C): 71.8 ml 34.04 ml/m  AORTIC VALVE                   PULMONIC VALVE AV Area (Vmax):    3.19 cm    PV Vmax:        0.71 m/s AV Area (Vmean):   3.26 cm    PV Peak grad:   2.0 mmHg AV Area (VTI):     4.48 cm    RVOT Peak grad: 4 mmHg AV Vmax:           108.50 cm/s AV Vmean:          74.250 cm/s AV VTI:            0.152 m AV Peak Grad:      4.7 mmHg AV Mean Grad:      2.5 mmHg LVOT Vmax:         99.80 cm/s LVOT Vmean:        69.800 cm/s LVOT VTI:          0.196 m LVOT/AV VTI ratio: 1.29  AORTA Ao Root diam: 3.70 cm MITRAL VALVE                TRICUSPID VALVE MV Area (PHT): 4.06 cm     TR Peak grad:   19.5 mmHg MV Decel Time: 187 msec     TR Vmax:        221.00 cm/s MV E velocity: 104.00 cm/s                             SHUNTS                             Systemic VTI:  0.20 m  Systemic Diam: 2.10 cm Kate Sable MD Electronically signed by Kate Sable MD Signature Date/Time: 03/17/2020/2:43:01 PM    Final    ASSESSMENT AND PLAN:  Raymond Barnett is a 86 y.o. male with medical history significant for chronic diastolic dysfunction CHF, COPD, status post recent right total hip arthroplasty and  peripheral arterial disease who presents from the skilled nursing facility for evaluation of shortness of breath.  Shortness of breath is worse with any form of exertion and is associated with a cough productive of clear phlegm.  He has no lower extremity swelling. He denies having any orthopnea.  Patient states that his movement has been limited due to shortness of breath as well as pain in his hip.  Acute on chronic hypoxic respiratory failure secondary to CHF acute on chronic diastolic congestive heart failure -- patient came in from [facility] with sats 82% on room air. He was placed on not rebreather -- currently on heated high flow nasal cannula -- IV Lasix-- urine output 3 L -- appreciate [facility] cardiology input -- elevated BNP --  daily weight, input output -- repeat Echo pending --BP soft --Hold Ace inhibitor (lisinopril was held at d/c on nov 16th due to low BP)  Mild leukocytosis with questionable pneumonia on x-ray -- clinically does not appear pneumonia -M RSA PCR negative-- DC vancomycin -- Pro calcitonin normal -will-you to monitor one more day if remains afebrile and white count stable will discontinue antibiotics -- patient does not have fever, no productive yellow cough.  Atrial fibrillation with RVR new onset in the setting of CHF -- on IV diltiazem drip per cardiology -- continue heparin drip given elevated CHADSVAC2 score  Recent right hip fracture status post surgery -- patient went to [facility] November 16  COPD chronic -PRN nebs  BPH continue Proscar  history of peripheral arterial disease continue aspirin  Glaucoma continue Timolol eye drops  Procedures:none Family communication : Consults :cardiology [facility] CODE STATUS: DNR prior to arrival DVT Prophylaxis : heparin drip  Status is: Inpatient  Remains inpatient appropriate because:IV treatments appropriate due to intensity of illness or inability to take PO   Dispo: The patient is  from: SNF              Anticipated d/c is to: SNF              Anticipated d/c date is: 3 days              Patient currently is not medically stable to d/c.  Patient admitted with acute on chronic hypoxic respiratory failure currently requiring heated high flow nasal cannula. Getting aggressive IV Lasix for CHF treatment.      TOTAL TIME TAKING CARE OF THIS PATIENT: 35 minutes.  >50% time spent on counselling and coordination of care  Note: This dictation was prepared with Dragon dictation along with smaller phrase technology. Any transcriptional errors that result from this process are unintentional.  Fritzi Mandes M.D    [facility]   CC: Primary care physician; [name], DOPatient ID: Raymond Barnett, male   DOB: 12/01/1929, 86 y.o.   MRN: [id number]

## 2020-03-17 NOTE — Progress Notes (Signed)
PT Cancellation Note  Patient Details Name: Raymond Barnett MRN: [id number] DOB: 1930/02/22   Cancelled Treatment:    Reason Eval/Treat Not Completed:  (Consult received and chart reviewed.  Patient politely declines session this date due to fatigue, constipation; also noted with HR fluctuating 120-140s during interview.  Will re-attempt next date as medically appropriate and available.)   Of note, has recently been at [facility] for [place] since R hip fracture repair (s/p R hip hemiarthroplasty 03/03/20, WBAT, ant THPs); actively working with therapy, endorses short-distance gait with RW in recent therapy sessions.   Ai Sonnenfeld H. Owens Shark, PT, DPT, NCS 03/17/20, 3:20 PM [phone]

## 2020-03-17 NOTE — Consult Note (Signed)
ANTICOAGULATION CONSULT NOTE - Consult  Pharmacy Consult for Heparin gtt Indication: atrial fibrillation  No Known Allergies  Patient Measurements: Height: 5\' 10"  (177.8 cm) Weight: 93 kg (205 lb) IBW/kg (Calculated) : 73 Heparin Dosing Weight: 91.8kg  Vital Signs: BP: 133/74 (11/26 0800) Pulse Rate: 109 (11/26 0800)  Labs: Recent Labs    03/08/2020 0753 03/03/2020 0920 02/21/2020 1046 03/01/2020 1242 03/17/20 0624  HGB 10.4*  --   --   --  10.4*  HCT 33.0*  --   --   --  33.1*  PLT 275  --   --   --  239  LABPROT  --   --   --   --  14.5  INR  --   --   --   --  1.2  CREATININE  --  0.82  --   --  0.88  TROPONINIHS 159*  --  244* 294*  --     Estimated Creatinine Clearance: 66.5 mL/min (by C-G formula based on SCr of 0.88 mg/dL).   Medical History: Past Medical History:  Diagnosis Date  . (HFpEF) heart failure with improved ejection fraction ([place])    a. 08/2017 Echo: EF 45-50%; b. 10/2019 Echo: EF 55-60%. No rwma, mild LVH, Gr2 DD, RVSP 47.33mmHg. Mod dil LA. Mod MR. Asc Ao 40mm.  . Arthritis   . Benign prostate hyperplasia    lower urinary tract symptoms  . Cataracts, bilateral   . COPD (chronic obstructive pulmonary disease) ([place])   . DDD (degenerative disc disease), lumbar   . Glaucoma   . H/O carpal tunnel syndrome   . History of osteomyelitis 1953   right leg  . Hypertension   . Moderate mitral regurgitation    a. 10/2019 Echo: Mod MR.  Marland Kitchen PAD (peripheral artery disease) ([place])    a. 08/2017 LE ABI & Duplex: ABI R 0.60, L 1.07. 30-49% R popliteal and 50-74% R PT stenoses.  . Thrombocytopenia ([facility])     Medications:  ASA 81mg  QD  Assessment: 86yo M with h/o HFpEF, COPD, PAD, & HTN who presents due to a 2-day history of worsening shortness of breath and cough.  Recent admit to hospital for hip surgery. Currently indicated for anticoagulation for atrial fibrillation and profile reviewed for medication interactions. INR 1.2; H/H 10.4/33.1; Plts 275>239 at admit. Trop  294.  Goal of Therapy:  Heparin level 0.3-0.7 units/ml Monitor platelets by anticoagulation protocol: Yes   Plan:  Give 4000 units bolus x 1 Start heparin infusion at 1300 units/hr Check anti-Xa level in 8 hours and daily while on heparin Continue to monitor H&H and platelets  Erlene Quan D Baylen Buckner 03/17/2020,9:25 AM

## 2020-03-17 NOTE — Consult Note (Signed)
[place] for Heparin gtt Indication: atrial fibrillation  Patient Measurements: Heparin Dosing Weight: 91.8kg  Labs: Recent Labs    02/26/2020 0753 02/28/2020 0920 03/06/2020 1046 02/28/2020 1242 03/17/20 0624 03/17/20 1743  HGB 10.4*  --   --   --  10.4*  --   HCT 33.0*  --   --   --  33.1*  --   PLT 275  --   --   --  239  --   LABPROT  --   --   --   --  14.5  --   INR  --   --   --   --  1.2  --   HEPARINUNFRC  --   --   --   --   --  0.68  CREATININE  --  0.82  --   --  0.88  --   TROPONINIHS 159*  --  244* 294*  --   --     Estimated Creatinine Clearance: 66.5 mL/min (by C-G formula based on SCr of 0.88 mg/dL).   Medical History: Past Medical History:  Diagnosis Date  . (HFpEF) heart failure with improved ejection fraction ([place])    a. 08/2017 Echo: EF 45-50%; b. 10/2019 Echo: EF 55-60%. No rwma, mild LVH, Gr2 DD, RVSP 47.32mmHg. Mod dil LA. Mod MR. Asc Ao 71mm.  . Arthritis   . Benign prostate hyperplasia    lower urinary tract symptoms  . Cataracts, bilateral   . COPD (chronic obstructive pulmonary disease) ([place])   . DDD (degenerative disc disease), lumbar   . Glaucoma   . H/O carpal tunnel syndrome   . History of osteomyelitis 1953   right leg  . Hypertension   . Moderate mitral regurgitation    a. 10/2019 Echo: Mod MR.  Marland Kitchen PAD (peripheral artery disease) ([place])    a. 08/2017 LE ABI & Duplex: ABI R 0.60, L 1.07. 30-49% R popliteal and 50-74% R PT stenoses.  . Thrombocytopenia (HCC)     Medications:  ASA 81mg  QD  Assessment: 86yo M with h/o HFpEF, COPD, PAD, & HTN who presents due to a 2-day history of worsening shortness of breath and cough.  Recent admit to hospital for hip surgery. Currently indicated for anticoagulation for atrial fibrillation and profile reviewed for medication interactions. INR 1.2; H/H 10.4/33.1; Plts 275>239 at admit. Trop 294.  Goal of Therapy:  Heparin level 0.3-0.7 units/ml Monitor platelets by  anticoagulation protocol: Yes   Plan:  --11/26 at 1743 HL = 0.68, therapeutic x 1 at upper limit of goal range. Will maintain heparin infusion at 1300 units/hr --Re-check confirmatory HL in 8 hours --Daily CBC per protocol  Benita Gutter 03/17/2020,6:15 PM

## 2020-03-17 NOTE — Consult Note (Signed)
Pharmacy Antibiotic Note  Raymond Barnett is a 86 y.o. male admitted on 03/17/2020 with HCAP.  Pharmacy has been consulted for Cefepime dosing.  Plan: - MRSA PCR 11/26 returned negative and per discussion with Dr. Posey Pronto will discontinue vancomycin and continue cefepime as below. - Continue Cefepime 2g q8h  (per CrCL >29ml/min)  Height: 5\' 10"  (177.8 cm) Weight: 93 kg (205 lb) IBW/kg (Calculated) : 73  Temp (24hrs), Avg:97.9 F (36.6 C), Min:97.9 F (36.6 C), Max:97.9 F (36.6 C)  Recent Labs  Lab 03/08/2020 0753 02/24/2020 0920 02/26/2020 1038 03/17/20 0624 03/17/20 1013  WBC 15.8*  --   --  15.4*  --   CREATININE  --  0.82  --  0.88  --   LATICACIDVEN 3.3*  --  2.9*  --  2.2*    Estimated Creatinine Clearance: 66.5 mL/min (by C-G formula based on SCr of 0.88 mg/dL).    No Known Allergies  Antimicrobials this admission: ongoing Cefepime  11/25 >> Completed Vancomycin 2g x1; 1.25g q12h (11/25 >> 11/26)   Microbiology results: 11/25 BCx: NGx1d 11/26  MRSA PCR: Negative  Thank you for allowing pharmacy to be a part of this patient's care.  Raymond Barnett 03/17/2020 12:07 PM

## 2020-03-17 NOTE — Progress Notes (Signed)
Progress Note  Patient Name: Raymond Barnett Date of Encounter: 03/17/2020  [facility] Cardiologist: Nelva Bush, MD   Subjective   Patient continues to complain of shortness of breath and some lower abdominal pain/distention, though overall he feels better than yesterday. He denies chest pain and palpitations.  Inpatient Medications    Scheduled Meds: . aspirin  81 mg Oral Daily  . calcium-vitamin D  1 tablet Oral Daily  . cholecalciferol  1,000 Units Oral Daily  . diltiazem  10 mg Intravenous Once  . docusate sodium  100 mg Oral BID  . finasteride  5 mg Oral Daily  . furosemide  40 mg Intravenous Q12H  . pantoprazole  40 mg Oral Daily  . polyethylene glycol  17 g Oral QHS  . timolol  1 drop Both Eyes BID  . traZODone  100 mg Oral QHS   Continuous Infusions: . ceFEPime (MAXIPIME) IV 2 g (03/17/20 0603)  . diltiazem (CARDIZEM) infusion    . vancomycin 1,250 mg (03/17/20 0917)   PRN Meds: acetaminophen **OR** acetaminophen, alum & mag hydroxide-simeth, bisacodyl, fluticasone, ipratropium-albuterol, methocarbamol, ondansetron **OR** ondansetron (ZOFRAN) IV, polyvinyl alcohol   Vital Signs    Vitals:   03/17/20 0300 03/17/20 0600 03/17/20 0800 03/17/20 0816  BP: 138/85 139/68 133/74   Pulse: (!) 102 (!) 110 (!) 109   Resp: 20 (!) 25 (!) 28   Temp:      TempSrc:      SpO2: 96% 95% 93% 94%  Weight:      Height:        Intake/Output Summary (Last 24 hours) at 03/17/2020 0923 Last data filed at 03/17/2020 0800 Gross per 24 hour  Intake 1130.73 ml  Output 3050 ml  Net -1919.27 ml   Last 3 Weights 03/06/2020 03/06/2020 03/05/2020  Weight (lbs) 205 lb 225 lb 12 oz 222 lb 7.1 oz  Weight (kg) 92.987 kg 102.4 kg 100.9 kg      Telemetry    Predominately sinus tachycardia with PACs and PVCs. However, over the last 30 minutes, telemetry is most consistent with atrial fibrillation with rapid ventricular response. - Personally Reviewed  ECG    No new  tracing.  Physical Exam   GEN: No acute distress. High flow nasal cannula in place. Neck:  JVP approximately 8 cm with positive HJR. Cardiac:  Tachycardic and irregularly irregular without murmurs. Respiratory:  Diminished breath sounds at both lung bases, left worse than right. No wheezes or crackles. GI: Soft, nontender, non-distended  MS:  Trace pretibial edema bilaterally. Neuro:  Nonfocal  Psych: Normal affect   Labs    High Sensitivity Troponin:   Recent Labs  Lab 03/03/20 0020 03/21/2020 0753 03/12/2020 1046 02/21/2020 1242  TROPONINIHS 49* 159* 244* 294*      Chemistry Recent Labs  Lab 03/01/2020 0920 03/17/20 0624  NA 132* 135  K 4.2 4.1  CL 94* 94*  CO2 26 27  GLUCOSE 128* 111*  BUN 18 24*  CREATININE 0.82 0.88  CALCIUM 9.0 9.1  PROT 6.3*  --   ALBUMIN 3.1*  --   AST 57*  --   ALT 35  --   ALKPHOS 88  --   BILITOT 3.2*  --   GFRNONAA >60 >60  ANIONGAP 12 14     Hematology Recent Labs  Lab 02/22/2020 0753 03/17/20 0624  WBC 15.8* 15.4*  RBC 3.29* 3.29*  HGB 10.4* 10.4*  HCT 33.0* 33.1*  MCV 100.3* 100.6*  MCH 31.6 31.6  MCHC 31.5 31.4  RDW 23.8* 23.4*  PLT 275 239    BNP Recent Labs  Lab 03/20/2020 0753  BNP 1,276.9*     DDimer No results for input(s): DDIMER in the last 168 hours.   Radiology    CT Angio Chest PE W and/or Wo Contrast  Result Date: 03/11/2020 CLINICAL DATA:  Clinical high probability for pulmonary embolus. Shortness of breath. Abdominal distension and jaundice. EXAM: CT ANGIOGRAPHY CHEST CT ABDOMEN AND PELVIS WITH CONTRAST TECHNIQUE: Multidetector CT imaging of the chest was performed using the standard protocol during bolus administration of intravenous contrast. Multiplanar CT image reconstructions and MIPs were obtained to evaluate the vascular anatomy. Multidetector CT imaging of the abdomen and pelvis was performed using the standard protocol during bolus administration of intravenous contrast. CONTRAST:  20mL OMNIPAQUE  IOHEXOL 350 MG/ML SOLN COMPARISON:  CT chest 05/19/2017 abdominal sonogram 11/12/2011 FINDINGS: CTA CHEST FINDINGS Cardiovascular: Satisfactory opacification of the pulmonary arteries to the segmental level. Respiratory motion artifact however diminishes exam detail within the lower lobe pulmonary arteries at the segmental and subsegmental levels. The main pulmonary artery is patent. No central obstructing embolus. No lobar or segmental pulmonary artery filling defects identified bilaterally. The ascending thoracic aorta appears increased in caliber measuring 4.2 cm. Aortic atherosclerosis and coronary artery calcifications. Mild cardiac enlargement. No pericardial effusion. Mediastinum/Nodes: Normal appearance of the thyroid gland. The trachea appears patent and is midline. Normal appearance of the esophagus. No enlarged axillary, supraclavicular or mediastinal lymph nodes. Prominent prevascular and right paratracheal nodes are identified measuring up to 1.3 cm. Lungs/Pleura: Moderate bilateral pleural effusions are identified. Extensive interstitial and airspace disease is noted within the right upper lobe, left upper lobe, lingula and right middle lobe. Subsegmental atelectasis and volume loss noted within both lower lung zones. Musculoskeletal: The bones appear diffusely osteopenic. No acute or suspicious osseous findings. Review of the MIP images confirms the above findings. CT ABDOMEN and PELVIS FINDINGS Hepatobiliary: No suspicious liver abnormality. Mural calcifications are identified within the gallbladder fundus, image 27/4. No bile duct dilatation. Pancreas: Unremarkable. No pancreatic ductal dilatation or surrounding inflammatory changes. Spleen: Normal in size without focal abnormality. Adrenals/Urinary Tract: Normal adrenal glands. No hydronephrosis identified bilaterally. Bilateral kidney cysts are identified. The largest arises from the inferior pole of right kidney measuring 8.5 cm. There is a hyper  dense lesion arising from the anterior cortex of the upper pole of right kidney measuring 0.7 by 0.9 cm, image 28/4. Additional, subcentimeter kidney lesions are noted bilaterally which are too small to reliably characterize. No hydronephrosis identified bilaterally. No hydroureter or ureteral lithiasis. Stomach/Bowel: Stomach is nondistended. The appendix is visualized and appears normal. No abnormal bowel dilatation, inflammation or distension. Distal colonic diverticula identified without acute inflammation. Moderate stool burden noted throughout the colon Vascular/Lymphatic: Aortic atherosclerosis. No aneurysm. No abdominopelvic adenopathy. Reproductive: Prostate gland is obscured by streak artifact from right hip arthroplasty. Other: No significant free fluid or fluid collections. Musculoskeletal: Bilateral sacroiliitis noted, right greater than left. Previous right hip arthroplasty. Multi level degenerative disc disease is noted throughout the lumbar spine. This is most advanced at L5-S1. Acute appearing superior endplate deformity is noted at the L2 level. With loss of approximately 15% of the vertebral body height centrally. Review of the MIP images confirms the above findings. IMPRESSION: 1. No evidence for acute pulmonary embolus. 2. Bilateral pleural effusions with extensive bilateral interstitial and airspace disease identified. Findings are concerning for congestive heart failure. Superimposed multifocal pneumonia not excluded. 3. No acute  findings within the abdomen or pelvis. 4. Mild acute appearing superior endplate deformity at L2. 5. There is a hyperdense lesion arising off the anterior cortex of the left kidney which does not meet criteria for simple cyst. This may represent a hemorrhagic cyst or solid enhancing lesion. When the patient is clinically stable and able to follow directions and hold their breath (preferably as an outpatient) further evaluation with dedicated abdominal MRI should be  considered. 6. Bilateral kidney cysts. Aortic Atherosclerosis (ICD10-I70.0). Electronically Signed   By: Kerby Moors M.D.   On: 03/02/2020 12:23   CT ABDOMEN PELVIS W CONTRAST  Result Date: 03/14/2020 CLINICAL DATA:  Clinical high probability for pulmonary embolus. Shortness of breath. Abdominal distension and jaundice. EXAM: CT ANGIOGRAPHY CHEST CT ABDOMEN AND PELVIS WITH CONTRAST TECHNIQUE: Multidetector CT imaging of the chest was performed using the standard protocol during bolus administration of intravenous contrast. Multiplanar CT image reconstructions and MIPs were obtained to evaluate the vascular anatomy. Multidetector CT imaging of the abdomen and pelvis was performed using the standard protocol during bolus administration of intravenous contrast. CONTRAST:  44mL OMNIPAQUE IOHEXOL 350 MG/ML SOLN COMPARISON:  CT chest 05/19/2017 abdominal sonogram 11/12/2011 FINDINGS: CTA CHEST FINDINGS Cardiovascular: Satisfactory opacification of the pulmonary arteries to the segmental level. Respiratory motion artifact however diminishes exam detail within the lower lobe pulmonary arteries at the segmental and subsegmental levels. The main pulmonary artery is patent. No central obstructing embolus. No lobar or segmental pulmonary artery filling defects identified bilaterally. The ascending thoracic aorta appears increased in caliber measuring 4.2 cm. Aortic atherosclerosis and coronary artery calcifications. Mild cardiac enlargement. No pericardial effusion. Mediastinum/Nodes: Normal appearance of the thyroid gland. The trachea appears patent and is midline. Normal appearance of the esophagus. No enlarged axillary, supraclavicular or mediastinal lymph nodes. Prominent prevascular and right paratracheal nodes are identified measuring up to 1.3 cm. Lungs/Pleura: Moderate bilateral pleural effusions are identified. Extensive interstitial and airspace disease is noted within the right upper lobe, left upper lobe,  lingula and right middle lobe. Subsegmental atelectasis and volume loss noted within both lower lung zones. Musculoskeletal: The bones appear diffusely osteopenic. No acute or suspicious osseous findings. Review of the MIP images confirms the above findings. CT ABDOMEN and PELVIS FINDINGS Hepatobiliary: No suspicious liver abnormality. Mural calcifications are identified within the gallbladder fundus, image 27/4. No bile duct dilatation. Pancreas: Unremarkable. No pancreatic ductal dilatation or surrounding inflammatory changes. Spleen: Normal in size without focal abnormality. Adrenals/Urinary Tract: Normal adrenal glands. No hydronephrosis identified bilaterally. Bilateral kidney cysts are identified. The largest arises from the inferior pole of right kidney measuring 8.5 cm. There is a hyper dense lesion arising from the anterior cortex of the upper pole of right kidney measuring 0.7 by 0.9 cm, image 28/4. Additional, subcentimeter kidney lesions are noted bilaterally which are too small to reliably characterize. No hydronephrosis identified bilaterally. No hydroureter or ureteral lithiasis. Stomach/Bowel: Stomach is nondistended. The appendix is visualized and appears normal. No abnormal bowel dilatation, inflammation or distension. Distal colonic diverticula identified without acute inflammation. Moderate stool burden noted throughout the colon Vascular/Lymphatic: Aortic atherosclerosis. No aneurysm. No abdominopelvic adenopathy. Reproductive: Prostate gland is obscured by streak artifact from right hip arthroplasty. Other: No significant free fluid or fluid collections. Musculoskeletal: Bilateral sacroiliitis noted, right greater than left. Previous right hip arthroplasty. Multi level degenerative disc disease is noted throughout the lumbar spine. This is most advanced at L5-S1. Acute appearing superior endplate deformity is noted at the L2 level. With loss of approximately  15% of the vertebral body height  centrally. Review of the MIP images confirms the above findings. IMPRESSION: 1. No evidence for acute pulmonary embolus. 2. Bilateral pleural effusions with extensive bilateral interstitial and airspace disease identified. Findings are concerning for congestive heart failure. Superimposed multifocal pneumonia not excluded. 3. No acute findings within the abdomen or pelvis. 4. Mild acute appearing superior endplate deformity at L2. 5. There is a hyperdense lesion arising off the anterior cortex of the left kidney which does not meet criteria for simple cyst. This may represent a hemorrhagic cyst or solid enhancing lesion. When the patient is clinically stable and able to follow directions and hold their breath (preferably as an outpatient) further evaluation with dedicated abdominal MRI should be considered. 6. Bilateral kidney cysts. Aortic Atherosclerosis (ICD10-I70.0). Electronically Signed   By: Kerby Moors M.D.   On: 03/12/2020 12:23   DG Chest Portable 1 View  Result Date: 03/09/2020 CLINICAL DATA:  Short of breath. EXAM: PORTABLE CHEST 1 VIEW COMPARISON:  03/02/20 FINDINGS: Stable cardiomediastinal contours. Aortic atherosclerosis. Increase in bilateral pleural effusions and interstitial edema. New bilateral airspace opacities are noted particularly involving the upper lobes. IMPRESSION: 1. Worsening CHF pattern. 2. New bilateral airspace opacities concerning for multifocal infection. Electronically Signed   By: Kerby Moors M.D.   On: 03/12/2020 08:24    Cardiac Studies   TTE (11/11/2019): 1. Left ventricular ejection fraction, by estimation, is 55 to 60%. The  left ventricle has normal function. The left ventricle has no regional  wall motion abnormalities. There is mild left ventricular hypertrophy.  Left ventricular diastolic parameters  are consistent with Grade II diastolic dysfunction (pseudonormalization).  2. Right ventricular systolic function is normal. The right ventricular    size is normal. There is moderately elevated pulmonary artery systolic  pressure. The estimated right ventricular systolic pressure is 16.1 mmHg.  3. Left atrial size was moderately dilated.  4. Moderate mitral valve regurgitation.  5. There is mild dilatation of the ascending aorta measuring 41 mm.   Patient Profile     86 y.o. male with history of chronic HFpEF and COPD, whom we have been asked to see due to acute respiratory failure with hypoxia secondary to a combination of acute on chronic HFpEF and pneumonia. Hospital course has been complicated by atrial fibrillation with rapid ventricular response (new).  Assessment & Plan    Acute on chronic HFpEF: Mr. Nevills does not have gross volume overload, though his JVP is mildly elevated and he has diminished breath sounds at the lung bases (left greater than right) consistent with pleural effusions noted on CTA of the chest. BNP was also quite elevated on admission yesterday. He is net -1.1 L over the last 24 hours.  Continue furosemide 40 mg IV twice daily for goal net negative fluid balance of 1 to 2 L in 24 hours.  Repeat echocardiogram to assess for interval decline in LVEF.  Atrial fibrillation with rapid ventricular response: New diagnosis for Mr. Piscitello, likely precipitated by his acute illness. TSH noted to be normal earlier this month. Calcium also normal this morning.  Initiate diltiazem infusion with starting bolus for target resting heart rate less than 110 bpm.  Initiate heparin infusion per pharmacy, given CHA2DS2-VASc score of at least 4 (age x2, hypertension, and HFpEF).  Acute on chronic respiratory failure with hypoxia: This is likely multifactorial, driven by volume overload and possible pneumonia superimposed on baseline COPD.  Continue diuresis, as above.  Antimicrobial and COPD therapy per  internal medicine.  Wean supplemental oxygen as tolerated.  For questions or updates, please contact [place] Please consult [email or web address] for contact info under [facility].     Signed, Nelva Bush, MD  03/17/2020, 9:23 AM

## 2020-03-17 NOTE — Progress Notes (Signed)
*  PRELIMINARY RESULTS* Echocardiogram 2D Echocardiogram has been performed.  Sherrie Sport 03/17/2020, 2:05 PM

## 2020-03-18 DIAGNOSIS — I10 Essential (primary) hypertension: Secondary | ICD-10-CM

## 2020-03-18 DIAGNOSIS — J441 Chronic obstructive pulmonary disease with (acute) exacerbation: Secondary | ICD-10-CM | POA: Diagnosis not present

## 2020-03-18 DIAGNOSIS — I5031 Acute diastolic (congestive) heart failure: Secondary | ICD-10-CM | POA: Diagnosis not present

## 2020-03-18 DIAGNOSIS — J9601 Acute respiratory failure with hypoxia: Secondary | ICD-10-CM | POA: Diagnosis not present

## 2020-03-18 DIAGNOSIS — I480 Paroxysmal atrial fibrillation: Secondary | ICD-10-CM

## 2020-03-18 DIAGNOSIS — I4891 Unspecified atrial fibrillation: Secondary | ICD-10-CM | POA: Diagnosis not present

## 2020-03-18 DIAGNOSIS — J189 Pneumonia, unspecified organism: Secondary | ICD-10-CM

## 2020-03-18 LAB — BASIC METABOLIC PANEL
Anion gap: 13 (ref 5–15)
BUN: 34 mg/dL — ABNORMAL HIGH (ref 8–23)
CO2: 28 mmol/L (ref 22–32)
Calcium: 8.9 mg/dL (ref 8.9–10.3)
Chloride: 93 mmol/L — ABNORMAL LOW (ref 98–111)
Creatinine, Ser: 1.12 mg/dL (ref 0.61–1.24)
GFR, Estimated: >60 mL/min (ref >60)
Glucose, Bld: 140 mg/dL — ABNORMAL HIGH (ref 70–99)
Potassium: 3 mmol/L — ABNORMAL LOW (ref 3.5–5.1)
Sodium: 134 mmol/L — ABNORMAL LOW (ref 135–145)

## 2020-03-18 LAB — HEPARIN LEVEL (UNFRACTIONATED): Heparin Unfractionated: 0.63 IU/mL (ref 0.30–0.70)

## 2020-03-18 LAB — CBC
HCT: 30.7 % — ABNORMAL LOW (ref 39.0–52.0)
Hemoglobin: 9.9 g/dL — ABNORMAL LOW (ref 13.0–17.0)
MCH: 31.7 pg (ref 26.0–34.0)
MCHC: 32.2 g/dL (ref 30.0–36.0)
MCV: 98.4 fL (ref 80.0–100.0)
Platelets: 247 10*3/uL (ref 150–400)
RBC: 3.12 MIL/uL — ABNORMAL LOW (ref 4.22–5.81)
RDW: 23.8 % — ABNORMAL HIGH (ref 11.5–15.5)
WBC: 13.9 10*3/uL — ABNORMAL HIGH (ref 4.0–10.5)
nRBC: 1.5 % — ABNORMAL HIGH (ref 0.0–0.2)

## 2020-03-18 MED ORDER — AMIODARONE HCL IN DEXTROSE 360-4.14 MG/200ML-% IV SOLN
30.0000 mg/h | INTRAVENOUS | Status: DC
  Administered 2020-03-18 – 2020-03-20 (×4): 30 mg/h via INTRAVENOUS
  Filled 2020-03-18 (×3): qty 200

## 2020-03-18 MED ORDER — AMIODARONE HCL IN DEXTROSE 360-4.14 MG/200ML-% IV SOLN
60.0000 mg/h | INTRAVENOUS | Status: DC
  Administered 2020-03-18 (×2): 60 mg/h via INTRAVENOUS
  Filled 2020-03-18 (×2): qty 200

## 2020-03-18 MED ORDER — ALPRAZOLAM 0.25 MG PO TABS
0.2500 mg | ORAL_TABLET | Freq: Two times a day (BID) | ORAL | Status: DC | PRN
  Administered 2020-03-18 – 2020-03-19 (×2): 0.25 mg via ORAL
  Filled 2020-03-18 (×2): qty 1

## 2020-03-18 MED ORDER — AMIODARONE LOAD VIA INFUSION
150.0000 mg | Freq: Once | INTRAVENOUS | Status: AC
  Administered 2020-03-18: 150 mg via INTRAVENOUS
  Filled 2020-03-18: qty 83.34

## 2020-03-18 MED ORDER — DIPHENHYDRAMINE HCL 50 MG/ML IJ SOLN
25.0000 mg | Freq: Every evening | INTRAMUSCULAR | Status: DC | PRN
  Administered 2020-03-18: 25 mg via INTRAVENOUS
  Filled 2020-03-18: qty 1

## 2020-03-18 MED ORDER — POTASSIUM CHLORIDE 20 MEQ PO PACK
40.0000 meq | PACK | ORAL | Status: AC
  Administered 2020-03-18 (×2): 40 meq via ORAL
  Filled 2020-03-18 (×2): qty 2

## 2020-03-18 MED ORDER — ZOLPIDEM TARTRATE 5 MG PO TABS: 5.0000 mg | ORAL_TABLET | Freq: Every evening | ORAL | Status: DC | PRN

## 2020-03-18 NOTE — Consult Note (Signed)
ANTICOAGULATION CONSULT NOTE  Pharmacy Consult for Heparin gtt Indication: atrial fibrillation  Patient Measurements: Heparin Dosing Weight: 91.8kg  Labs: Recent Labs    03/07/2020 0753 03/11/2020 0753 03/11/2020 0920 03/03/2020 1046 03/05/2020 1242 03/17/20 0624 03/17/20 1743 03/18/20 0205  HGB 10.4*   < >  --   --   --  10.4*  --  9.9*  HCT 33.0*  --   --   --   --  33.1*  --  30.7*  PLT 275  --   --   --   --  239  --  247  LABPROT  --   --   --   --   --  14.5  --   --   INR  --   --   --   --   --  1.2  --   --   HEPARINUNFRC  --   --   --   --   --   --  0.68 0.63  CREATININE  --   --  0.82  --   --  0.88  --  1.12  TROPONINIHS 159*  --   --  244* 294*  --   --   --    < > = values in this interval not displayed.    Estimated Creatinine Clearance: 52.2 mL/min (by C-G formula based on SCr of 1.12 mg/dL).   Medical History: Past Medical History:  Diagnosis Date  . (HFpEF) heart failure with improved ejection fraction ([place])    a. 08/2017 Echo: EF 45-50%; b. 10/2019 Echo: EF 55-60%. No rwma, mild LVH, Gr2 DD, RVSP 47.36mmHg. Mod dil LA. Mod MR. Asc Ao 54mm.  . Arthritis   . Benign prostate hyperplasia    lower urinary tract symptoms  . Cataracts, bilateral   . COPD (chronic obstructive pulmonary disease) ([place])   . DDD (degenerative disc disease), lumbar   . Glaucoma   . H/O carpal tunnel syndrome   . History of osteomyelitis 1953   right leg  . Hypertension   . Moderate mitral regurgitation    a. 10/2019 Echo: Mod MR.  Marland Kitchen PAD (peripheral artery disease) ([place])    a. 08/2017 LE ABI & Duplex: ABI R 0.60, L 1.07. 30-49% R popliteal and 50-74% R PT stenoses.  . Thrombocytopenia (HCC)     Medications:  ASA 81mg  QD  Assessment: 86yo M with h/o HFpEF, COPD, PAD, & HTN who presents due to a 2-day history of worsening shortness of breath and cough.  Recent admit to hospital for hip surgery. Currently indicated for anticoagulation for atrial fibrillation and profile reviewed for  medication interactions. INR 1.2; H/H 10.4/33.1; Plts 275>239 at admit. Trop 294.  Goal of Therapy:  Heparin level 0.3-0.7 units/ml Monitor platelets by anticoagulation protocol: Yes   Plan:  --11/27 at 0205 HL = 0.63, therapeutic x 2. Will maintain heparin infusion at 1300 units/hr --Re-check HL daily --Daily CBC per protocol  Hart Robinsons A 03/18/2020,2:50 AM

## 2020-03-18 NOTE — Progress Notes (Signed)
Pt has intermittent confusion and hallucinations. Complains of gas pains, refuses medications for it. MD made aware of pts anxiety and prn xanax administered. VSS. Pt appears symmetrical and grips symmetrical. Surgical site dressing changed for drainage. Amiodarone gtt started, holding Cardizem gtt at this time per Cardiology.

## 2020-03-18 NOTE — Evaluation (Signed)
Physical Therapy Evaluation Patient Details Name: Raymond Barnett MRN: [id number] DOB: Dec 12, 1929 Today's Date: 03/18/2020   History of Present Illness  Pt admitted 2/2 SOB and hypoxia from [facility]. Found to hace CHF with acute hypoxic respiratory failure. Pt was recently d/c on 03/07/20 following operative repair of R hip fx (s/p R hip hemiarthroplasty). He has PMHx significant for HTN, DDD, and arthritis.  Clinical Impression  The pt presents this session with complaints of lack of sleep, poor positioning, and inability to move. He is agreeable to sit EOB and perform lateral scoots in order to improve positioning in bed. During this session with pt presents with poor tolerance to mobility demonstrating by increased anxiety, inability to maintain the upright position, and desaturation. At this time although presenting with overall good muscle strength his respiratory status greatly limits his functional mobility. Skilled PT services are required in order to optimize patient's tolerance to mobility, and also to improve his independence with bed level and OOB activity. At this time the pt would require return to SNF in order to optimize functional mobility.     Follow Up Recommendations SNF;Supervision for mobility/OOB    Equipment Recommendations       Recommendations for Other Services       Precautions / Restrictions Precautions Precautions: Fall Restrictions Weight Bearing Restrictions: No      Mobility  Bed Mobility Overal bed mobility: Needs Assistance Bed Mobility: Supine to Sit     Supine to sit: Mod assist;HOB elevated Sit to supine: Mod assist   General bed mobility comments: Pt requiring assitance for BLE management in order to achieve EOB. Once EOB the pt requires Mod A in order to square hips to the EOB. He is dependent for lateral scooting at EOB for positioning. Mod A for LE management to return to supine.    Transfers                     Ambulation/Gait                Stairs            Wheelchair Mobility    Modified Rankin (Stroke Patients Only)       Balance Overall balance assessment: Needs assistance Sitting-balance support: Bilateral upper extremity supported Sitting balance-Leahy Scale: Fair Sitting balance - Comments: Pt only able to tolerate sitting at EOB x 2 min prior to transiton back to supine.                                     Pertinent Vitals/Pain Pain Assessment: No/denies pain    Home Living Family/patient expects to be discharged to:: Skilled nursing facility Living Arrangements: Spouse/significant other Available Help at Discharge: Family Type of Home: House Home Access: Level entry     Home Layout: One level Home Equipment: Cane - single point;Walker - standard;Wheelchair - power      Prior Function Level of Independence: Independent         Comments: Prior to this admission pt was at [facility]. He states that only ambulated short distances 2/2 increased hip pain. He reports mostly utilizing w/c for mobility.     Hand Dominance        Extremity/Trunk Assessment   Upper Extremity Assessment Upper Extremity Assessment: Overall WFL for tasks assessed    Lower Extremity Assessment Lower Extremity Assessment: Overall WFL for tasks assessed  Communication      Cognition Arousal/Alertness: Awake/alert Behavior During Therapy: WFL for tasks assessed/performed Overall Cognitive Status: Within Functional Limits for tasks assessed                                 General Comments: pt self limiting this session, only achieving EOB x 2 min prior to stating that he had to return to supine.      General Comments      Exercises     Assessment/Plan    PT Assessment Patient needs continued PT services  PT Problem List         PT Treatment Interventions      PT Goals (Current goals can be found in the Care Plan section)   Acute Rehab PT Goals Patient Stated Goal: to get better PT Goal Formulation: With patient Time For Goal Achievement: 04/01/20 Potential to Achieve Goals: Fair    Frequency Min 2X/week   Barriers to discharge        Co-evaluation               AM-PAC PT "6 Clicks" Mobility  Outcome Measure Help needed turning from your back to your side while in a flat bed without using bedrails?: A Lot Help needed moving from lying on your back to sitting on the side of a flat bed without using bedrails?: A Lot Help needed moving to and from a bed to a chair (including a wheelchair)?: Total Help needed standing up from a chair using your arms (e.g., wheelchair or bedside chair)?: A Lot Help needed to walk in hospital room?: A Lot Help needed climbing 3-5 steps with a railing? : Total 6 Click Score: 10    End of Session Equipment Utilized During Treatment: Oxygen Activity Tolerance: Treatment limited secondary to medical complications (Comment) Patient left: in bed;with call bell/phone within reach Nurse Communication: Mobility status PT Visit Diagnosis: Dizziness and giddiness (R42);Other abnormalities of gait and mobility (R26.89)    Time: 8325-4982 PT Time Calculation (min) (ACUTE ONLY): 43 min   Charges:   PT Evaluation $PT Eval Moderate Complexity: 1 Mod PT Treatments $Therapeutic Activity: 23-37 mins        10:53 AM, 03/18/20 Devora Tortorella A. Saverio Danker PT, DPT Physical Therapist - [facility]   Kycen Spalla A Briana Newman 03/18/2020, 10:48 AM

## 2020-03-18 NOTE — Progress Notes (Addendum)
[place] [facility] [place] NAME: Raymond Barnett    MR#:  [id number]  DATE OF BIRTH:  1929-12-16  SUBJECTIVE:  patient came in from [facility] with increasing shortness of breath and hypoxia. He was discharged on 16 November after hip fracture surgery.  Some intermittent confusion--not slept well in 2 days per RN On HFNC 40/45 sats 88-93% REVIEW OF SYSTEMS:   Review of Systems  Constitutional: Positive for malaise/fatigue. Negative for chills, fever and weight loss.  HENT: Negative for ear discharge, ear pain and nosebleeds.   Eyes: Negative for blurred vision, pain and discharge.  Respiratory: Positive for shortness of breath. Negative for sputum production, wheezing and stridor.   Cardiovascular: Negative for chest pain, palpitations, orthopnea and PND.  Gastrointestinal: Negative for abdominal pain, diarrhea, nausea and vomiting.  Genitourinary: Negative for frequency and urgency.  Musculoskeletal: Negative for back pain and joint pain.  Neurological: Positive for weakness. Negative for sensory change, speech change and focal weakness.  Psychiatric/Behavioral: Negative for depression and hallucinations. The patient is not nervous/anxious.    Tolerating Diet:yes Tolerating PT: SNF  DRUG ALLERGIES:  No Known Allergies  VITALS:  Blood pressure 106/60, pulse 94, temperature 97.6 F (36.4 C), temperature source Oral, resp. rate (!) 30, height 5\' 10"  (1.778 m), weight 93 kg, SpO2 93 %.  PHYSICAL EXAMINATION:   Physical Exam  GENERAL:  86 y.o.-year-old patient lying in the bed with mild acute resp  distress. Appears chrnically ill HEENT: Head atraumatic, normocephalic. Oropharynx and nasopharynx clear.  HFNC +  LUNGS: decreasedbreath sounds bilaterally, no wheezing, bibasilar rales, no rhonchi. No use of accessory muscles of respiration.  CARDIOVASCULAR: S1, S2 normal. No murmurs, rubs, or gallops. tachycardia ABDOMEN: Soft, nontender,  nondistended. Bowel sounds present. No organomegaly or mass.  EXTREMITIES: No cyanosis, clubbing or edema b/l.    NEUROLOGIC: Cranial nerves II through XII are intact. No focal Motor or sensory deficits b/l.  weak PSYCHIATRIC:  patient is alert and oriented x 3.  SKIN: No obvious rash, lesion, or ulcer.     LABORATORY PANEL:  CBC Recent Labs  Lab 03/18/20 0205  WBC 13.9*  HGB 9.9*  HCT 30.7*  PLT 247    Chemistries  Recent Labs  Lab 03/21/2020 0920 03/17/20 0624 03/18/20 0205  NA 132*   < > 134*  K 4.2   < > 3.0*  CL 94*   < > 93*  CO2 26   < > 28  GLUCOSE 128*   < > 140*  BUN 18   < > 34*  CREATININE 0.82   < > 1.12  CALCIUM 9.0   < > 8.9  AST 57*  --   --   ALT 35  --   --   ALKPHOS 88  --   --   BILITOT 3.2*  --   --    < > = values in this interval not displayed.   Cardiac Enzymes No results for input(s): TROPONINI in the last 168 hours. RADIOLOGY:  ECHOCARDIOGRAM COMPLETE  Result Date: 03/17/2020    ECHOCARDIOGRAM REPORT   Patient Name:   Raymond Barnett Date of Exam: 03/17/2020 Medical Rec #:  [id number]      Height:       70.0 in Accession #:    [id number]     Weight:       205.0 lb Date of Birth:  April 27, 1929      BSA:  2.109 m Patient Age:    47 years       BP:           133/74 mmHg Patient Gender: M              HR:           109 bpm. Exam Location:  [facility] Procedure: 2D Echo, Cardiac Doppler and Color Doppler Indications:     CHF- acute diastolic 509.32  History:         Patient has prior history of Echocardiogram examinations, most                  recent 11/11/2019. COPD and PAD; Risk Factors:Hypertension.                  Heart failure with improved ejection fraction Moderate                  mitral regurgitation.  Sonographer:     Sherrie Sport RDCS (AE) Referring Phys:  [id number] CHRISTOPHER END Diagnosing Phys: Kate Sable MD IMPRESSIONS  1. Left ventricular ejection fraction, by estimation, is 50 to 55%. The left ventricle has low normal function. The left  ventricle has no regional wall motion abnormalities. There is moderate left ventricular hypertrophy. Left ventricular diastolic function could not be evaluated.  2. Right ventricular systolic function is low normal. The right ventricular size is mildly enlarged.  3. Left atrial size was mildly dilated.  4. Right atrial size was moderately dilated.  5. The mitral valve is degenerative. Trivial mitral valve regurgitation.  6. The aortic valve is tricuspid. Aortic valve regurgitation is mild. FINDINGS  Left Ventricle: Left ventricular ejection fraction, by estimation, is 50 to 55%. The left ventricle has low normal function. The left ventricle has no regional wall motion abnormalities. The left ventricular internal cavity size was normal in size. There is moderate left ventricular hypertrophy. Left ventricular diastolic function could not be evaluated. Right Ventricle: The right ventricular size is mildly enlarged. No increase in right ventricular wall thickness. Right ventricular systolic function is low normal. Left Atrium: Left atrial size was mildly dilated. Right Atrium: Right atrial size was moderately dilated. Pericardium: There is no evidence of pericardial effusion. Mitral Valve: The mitral valve is degenerative in appearance. Mild mitral annular calcification. Trivial mitral valve regurgitation. Tricuspid Valve: The tricuspid valve is normal in structure. Tricuspid valve regurgitation is not demonstrated. Aortic Valve: The aortic valve is tricuspid. Aortic valve regurgitation is mild. Aortic valve mean gradient measures 2.5 mmHg. Aortic valve peak gradient measures 4.7 mmHg. Aortic valve area, by VTI measures 4.48 cm. Pulmonic Valve: The pulmonic valve was normal in structure. Pulmonic valve regurgitation is not visualized. Aorta: The aortic root is normal in size and structure. IAS/Shunts: No atrial level shunt detected by color flow Doppler.  LEFT VENTRICLE PLAX 2D LVIDd:         4.11 cm LVIDs:          3.07 cm LV PW:         1.65 cm LV IVS:        1.77 cm LVOT diam:     2.10 cm LV SV:         68 LV SV Index:   32 LVOT Area:     3.46 cm  RIGHT VENTRICLE RV Basal diam:  4.51 cm LEFT ATRIUM           Index       RIGHT ATRIUM  Index LA diam:      4.00 cm 1.90 cm/m  RA Area:     30.40 cm LA Vol (A2C): 48.3 ml 22.90 ml/m RA Volume:   109.00 ml 51.67 ml/m LA Vol (A4C): 71.8 ml 34.04 ml/m  AORTIC VALVE                   PULMONIC VALVE AV Area (Vmax):    3.19 cm    PV Vmax:        0.71 m/s AV Area (Vmean):   3.26 cm    PV Peak grad:   2.0 mmHg AV Area (VTI):     4.48 cm    RVOT Peak grad: 4 mmHg AV Vmax:           108.50 cm/s AV Vmean:          74.250 cm/s AV VTI:            0.152 m AV Peak Grad:      4.7 mmHg AV Mean Grad:      2.5 mmHg LVOT Vmax:         99.80 cm/s LVOT Vmean:        69.800 cm/s LVOT VTI:          0.196 m LVOT/AV VTI ratio: 1.29  AORTA Ao Root diam: 3.70 cm MITRAL VALVE                TRICUSPID VALVE MV Area (PHT): 4.06 cm     TR Peak grad:   19.5 mmHg MV Decel Time: 187 msec     TR Vmax:        221.00 cm/s MV E velocity: 104.00 cm/s                             SHUNTS                             Systemic VTI:  0.20 m                             Systemic Diam: 2.10 cm Kate Sable MD Electronically signed by Kate Sable MD Signature Date/Time: 03/17/2020/2:43:01 PM    Final    ASSESSMENT AND PLAN:  ARRIE ZUERCHER is a 86 y.o. male with medical history significant for chronic diastolic dysfunction CHF, COPD, status post recent right total hip arthroplasty and peripheral arterial disease who presents from the skilled nursing facility for evaluation of shortness of breath.  Shortness of breath is worse with any form of exertion and is associated with a cough productive of clear phlegm.  He has no lower extremity swelling. He denies having any orthopnea.  Patient states that his movement has been limited due to shortness of breath as well as pain in his hip.  Acute on  chronic hypoxic respiratory failure secondary to CHF acute on chronic diastolic congestive heart failure -- patient came in from [facility] with sats 82% on room air. He was placed on not rebreather -- currently on heated high flow nasal cannula -- IV Lasix-- urine output 5.5 L -- appreciate [facility] cardiology input -- elevated BNP --  daily weight, input output -- repeat Echo shows EF 50-55% --BP soft --Hold Ace inhibitor (lisinopril was held at d/c on nov 16th due to low BP)  Mild leukocytosis with questionable  pneumonia on x-ray --clinically does not appear pneumonia -M RSA PCR negative-- DC vancomycin -- Pro calcitonin normal -pt remains afebrile and white count stable will discontinue antibiotics today -- patient does not have fever, no productive yellow cough.  Atrial fibrillation with RVR new onset in the setting of CHF -- on IV diltiazem drip per cardiology -- continue heparin drip given elevated CHADSVAC2 score --11/27--Per Dr Rockey Situ wean cardizem down and start IV amiodarone Bolus + gtt to see if pt converts to SR  Recent right hip fracture status post surgery -- patient went to [facility] November 16  COPD chronic -PRN nebs  BPH continue Proscar  history of peripheral arterial disease continue aspirin  Glaucoma continue Timolol eye drops  Procedures:none Family communication : Consults :cardiology [facility] CODE STATUS: DNR prior to arrival DVT Prophylaxis : heparin drip  Status is: Inpatient  Remains inpatient appropriate because:IV treatments appropriate due to intensity of illness or inability to take PO   Dispo: The patient is from: SNF              Anticipated d/c is to: SNF              Anticipated d/c date is: 3 days              Patient currently is not medically stable to d/c.  Patient admitted with acute on chronic hypoxic respiratory failure currently requiring heated high flow nasal cannula. Getting aggressive IV Lasix for CHF  treatment. IV amiodarone gtt today  TOC for d/c planning--per dter pt does not want to return to [place] THIS PATIENT: 25 minutes.  >50% time spent on counselling and coordination of care  Note: This dictation was prepared with Dragon dictation along with smaller phrase technology. Any transcriptional errors that result from this process are unintentional.  Fritzi Mandes M.D    [facility]   CC: Primary care physician; [name], DOPatient ID: [name], male   DOB: May 02, 1929, 86 y.o.   MRN: [id number]

## 2020-03-18 NOTE — Progress Notes (Signed)
Progress Note  Patient Name: Raymond Barnett Date of Encounter: 03/18/2020  Primary Cardiologist: Nelva Bush, MD  Subjective   Continues to require O2 via HFNC.  Notes dyspnea and drops in O2 sats w/ any mvmt in bed.  Trending low 90's @ rest.  No chest pain or palpitations.  Remains in Afib w/ rates hovering around 100.  Inpatient Medications    Scheduled Meds: . aspirin  81 mg Oral Daily  . calcium-vitamin D  1 tablet Oral Daily  . Chlorhexidine Gluconate Cloth  6 each Topical Daily  . cholecalciferol  1,000 Units Oral Daily  . docusate sodium  100 mg Oral BID  . finasteride  5 mg Oral Daily  . furosemide  40 mg Intravenous Q12H  . pantoprazole  40 mg Oral Daily  . polyethylene glycol  17 g Oral QHS  . potassium chloride  40 mEq Oral Q2H  . timolol  1 drop Both Eyes BID  . traZODone  100 mg Oral QHS   Continuous Infusions: . ceFEPime (MAXIPIME) IV 2 g (03/18/20 0544)  . diltiazem (CARDIZEM) infusion 10 mg/hr (03/17/20 2300)  . heparin 1,300 Units/hr (03/18/20 0142)   PRN Meds: acetaminophen **OR** acetaminophen, alum & mag hydroxide-simeth, bisacodyl, fluticasone, ipratropium-albuterol, methocarbamol, ondansetron **OR** ondansetron (ZOFRAN) IV, polyvinyl alcohol, zolpidem   Vital Signs    Vitals:   03/18/20 0700 03/18/20 0800 03/18/20 0900 03/18/20 1023  BP: 120/66 (!) 121/58 112/72 (!) 104/59  Pulse: 96 94 98 (!) 102  Resp: (!) 23 (!) 24 (!) 33 (!) 26  Temp:      TempSrc:      SpO2: 94% 94% 93% 92%  Weight:      Height:        Intake/Output Summary (Last 24 hours) at 03/18/2020 1035 Last data filed at 03/18/2020 1003 Gross per 24 hour  Intake 1398.01 ml  Output 2050 ml  Net -651.99 ml   Filed Weights   03/12/2020 0735  Weight: 93 kg    Physical Exam   GEN: Well nourished, well developed, in no acute distress.  HEENT: Grossly normal.  Neck: Supple, no JVD, carotid bruits, or masses. Cardiac: IR, IR, distant, no murmurs, rubs, or gallops. No  clubbing, cyanosis, trace RLE edema.  Radials 2+, DP/PT 1+ and equal bilaterally.  Respiratory:  Respirations regular and unlabored, clear to auscultation on R w/ Left basilar crackles and diminished breath sounds. GI: Soft, nontender, nondistended, BS + x 4. MS: no deformity or atrophy. Skin: warm and dry, no rash. Neuro:  Strength and sensation are intact. Psych: AAOx3.  Normal affect.  Labs    Chemistry Recent Labs  Lab 03/21/2020 0920 03/17/20 0624 03/18/20 0205  NA 132* 135 134*  K 4.2 4.1 3.0*  CL 94* 94* 93*  CO2 26 27 28   GLUCOSE 128* 111* 140*  BUN 18 24* 34*  CREATININE 0.82 0.88 1.12  CALCIUM 9.0 9.1 8.9  PROT 6.3*  --   --   ALBUMIN 3.1*  --   --   AST 57*  --   --   ALT 35  --   --   ALKPHOS 88  --   --   BILITOT 3.2*  --   --   GFRNONAA >60 >60 >60  ANIONGAP 12 14 13      Hematology Recent Labs  Lab 03/14/2020 0753 03/17/20 0624 03/18/20 0205  WBC 15.8* 15.4* 13.9*  RBC 3.29* 3.29* 3.12*  HGB 10.4* 10.4* 9.9*  HCT 33.0* 33.1*  30.7*  MCV 100.3* 100.6* 98.4  MCH 31.6 31.6 31.7  MCHC 31.5 31.4 32.2  RDW 23.8* 23.4* 23.8*  PLT 275 239 247    Cardiac Enzymes  Recent Labs  Lab 03/03/20 0020 03/10/2020 0753 03/15/2020 1046 02/23/2020 1242  TROPONINIHS 49* 159* 244* 294*      BNP Recent Labs  Lab 02/29/2020 0753  BNP 1,276.9*      Lipids  Lab Results  Component Value Date   CHOL 136 06/29/2019   HDL 41 06/29/2019   LDLCALC 79 06/29/2019   TRIG 82 06/29/2019   CHOLHDL 3.3 06/29/2019    HbA1c  Lab Results  Component Value Date   HGBA1C 5.0 06/29/2019    Radiology    CT Angio Chest PE W and/or Wo Contrast/CT Abd Pelvis w/ contrast  Result Date: 03/15/2020  IMPRESSION: 1. No evidence for acute pulmonary embolus. 2. Bilateral pleural effusions with extensive bilateral interstitial and airspace disease identified. Findings are concerning for congestive heart failure. Superimposed multifocal pneumonia not excluded. 3. No acute findings within  the abdomen or pelvis. 4. Mild acute appearing superior endplate deformity at L2. 5. There is a hyperdense lesion arising off the anterior cortex of the left kidney which does not meet criteria for simple cyst. This may represent a hemorrhagic cyst or solid enhancing lesion. When the patient is clinically stable and able to follow directions and hold their breath (preferably as an outpatient) further evaluation with dedicated abdominal MRI should be considered. 6. Bilateral kidney cysts. Aortic Atherosclerosis (ICD10-I70.0). Electronically Signed   By: Kerby Moors M.D.   On: 03/04/2020 12:23   DG Chest Portable 1 View  Result Date: 03/12/2020 CLINICAL DATA:  Short of breath. EXAM: PORTABLE CHEST 1 VIEW COMPARISON:  03/02/20 FINDINGS: Stable cardiomediastinal contours. Aortic atherosclerosis. Increase in bilateral pleural effusions and interstitial edema. New bilateral airspace opacities are noted particularly involving the upper lobes. IMPRESSION: 1. Worsening CHF pattern. 2. New bilateral airspace opacities concerning for multifocal infection. Electronically Signed   By: Kerby Moors M.D.   On: 03/09/2020 08:24   Telemetry    Afib, low 90's to 1-teens - Personally Reviewed  Cardiac Studies   2D Echocardiogram 11.26.2021 1. Left ventricular ejection fraction, by estimation, is 50 to 55%. The  left ventricle has low normal function. The left ventricle has no regional  wall motion abnormalities. There is moderate left ventricular hypertrophy.  Left ventricular diastolic  function could not be evaluated.  2. Right ventricular systolic function is low normal. The right  ventricular size is mildly enlarged.  3. Left atrial size was mildly dilated.  4. Right atrial size was moderately dilated.  5. The mitral valve is degenerative. Trivial mitral valve regurgitation.  6. The aortic valve is tricuspid. Aortic valve regurgitation is mild.  _____________   Patient Profile     86 y.o.  male with history of chronic HFpEF and COPD, whom we have been asked to see due to acute respiratory failure with hypoxia secondary to a combination of acute on chronic HFpEF and pneumonia. Hospital course has been complicated by atrial fibrillation with rapid ventricular response (new).   Assessment & Plan    1.  Acute on chronic HFpEF:  Admitted 11/25 w/ resp failure in setting of PNA/COPD and volume overload.  He has responded well to IV lasix and is minus 742 ml overnight and minus 2.5 L since admission.  Wt not recorded, though prev trended 90-92 kg earlier this year.  BUN/Creat bumping slightly this  AM.  On exam, volume looks pretty good. Will cont IV lasix today but suspect we can transition to PO tomorrow.  Was on lasix 20 daily at home previously.  Unclear at this point what role afib will play in ongoing dyspnea and volume.  2.  Acute on chronic hypoxic resp failure/AECOPD/PNA:  Cont to req O2 via HFNC.  Abx/nebs per IM.  3.  Afib w/ RVR:  Developed Afib on 11/26.  Rates trending 90's to 1-teens on IV dilt gtt.  CHA2DS2VASc = 5 (age x 2, HTN, HFpEF, Ao atherosclerosis/Cor Ca2+ noted on CT).  Denies palpitations.  As above, unclear what role this is playing in ongoing dyspnea.  As onset was witnessed, would have a low threshold to change from dilt to [facility] in an effort to convert him.  Will d/w Dr. Rockey Situ.  Cont heparin for now w/ plan to transition to eliquis once it's clear that he will not require invasive procedures.  4.  Hypokalemia:  Supplementation ordered.  5.  Macrocytic anemia:  Relatively stable. Follow on anticoagulation.  6.  Elevated HsTroponin/Demand Ischemia:  HsT up to 294 on 11/25.  No chest pain. Echo this admission w/ EF of 50-55% and w/o regional wall motion abnormalities. No plan for ischemic eval @ this time.   Signed, Murray Hodgkins, NP  03/18/2020, 10:35 AM    For questions or updates, please contact   Please consult [email or web address] for contact info under  Cardiology/STEMI.

## 2020-03-19 DIAGNOSIS — I5031 Acute diastolic (congestive) heart failure: Secondary | ICD-10-CM | POA: Diagnosis not present

## 2020-03-19 DIAGNOSIS — I4891 Unspecified atrial fibrillation: Secondary | ICD-10-CM | POA: Diagnosis not present

## 2020-03-19 DIAGNOSIS — I10 Essential (primary) hypertension: Secondary | ICD-10-CM | POA: Diagnosis not present

## 2020-03-19 DIAGNOSIS — I5023 Acute on chronic systolic (congestive) heart failure: Secondary | ICD-10-CM

## 2020-03-19 DIAGNOSIS — J441 Chronic obstructive pulmonary disease with (acute) exacerbation: Secondary | ICD-10-CM | POA: Diagnosis not present

## 2020-03-19 DIAGNOSIS — J9601 Acute respiratory failure with hypoxia: Secondary | ICD-10-CM | POA: Diagnosis not present

## 2020-03-19 DIAGNOSIS — I4819 Other persistent atrial fibrillation: Secondary | ICD-10-CM | POA: Diagnosis not present

## 2020-03-19 LAB — BASIC METABOLIC PANEL
Anion gap: 13 (ref 5–15)
BUN: 45 mg/dL — ABNORMAL HIGH (ref 8–23)
CO2: 28 mmol/L (ref 22–32)
Calcium: 8.6 mg/dL — ABNORMAL LOW (ref 8.9–10.3)
Chloride: 93 mmol/L — ABNORMAL LOW (ref 98–111)
Creatinine, Ser: 1.17 mg/dL (ref 0.61–1.24)
GFR, Estimated: 60 mL/min — ABNORMAL LOW (ref >60)
Glucose, Bld: 128 mg/dL — ABNORMAL HIGH (ref 70–99)
Potassium: 3.1 mmol/L — ABNORMAL LOW (ref 3.5–5.1)
Sodium: 134 mmol/L — ABNORMAL LOW (ref 135–145)

## 2020-03-19 LAB — CBC
HCT: 29 % — ABNORMAL LOW (ref 39.0–52.0)
Hemoglobin: 9.6 g/dL — ABNORMAL LOW (ref 13.0–17.0)
MCH: 32.4 pg (ref 26.0–34.0)
MCHC: 33.1 g/dL (ref 30.0–36.0)
MCV: 98 fL (ref 80.0–100.0)
Platelets: 225 10*3/uL (ref 150–400)
RBC: 2.96 MIL/uL — ABNORMAL LOW (ref 4.22–5.81)
RDW: 24 % — ABNORMAL HIGH (ref 11.5–15.5)
WBC: 15.8 10*3/uL — ABNORMAL HIGH (ref 4.0–10.5)
nRBC: 1.5 % — ABNORMAL HIGH (ref 0.0–0.2)

## 2020-03-19 LAB — HEPARIN LEVEL (UNFRACTIONATED): Heparin Unfractionated: 0.69 IU/mL (ref 0.30–0.70)

## 2020-03-19 MED ORDER — FLEET ENEMA 7-19 GM/118ML RE ENEM
1.0000 | ENEMA | Freq: Once | RECTAL | Status: AC
  Administered 2020-03-19: 1 via RECTAL

## 2020-03-19 MED ORDER — POTASSIUM CHLORIDE 20 MEQ PO PACK
40.0000 meq | PACK | Freq: Three times a day (TID) | ORAL | Status: AC
  Administered 2020-03-19 (×3): 40 meq via ORAL
  Filled 2020-03-19 (×3): qty 2

## 2020-03-19 NOTE — Consult Note (Signed)
ANTICOAGULATION CONSULT NOTE  Pharmacy Consult for Heparin gtt Indication: atrial fibrillation  Patient Measurements: Heparin Dosing Weight: 91.8kg  Labs: Recent Labs    02/26/2020 0753 03/18/2020 0753 03/07/2020 0920 03/21/2020 1046 03/13/2020 1242 03/17/20 0624 03/17/20 1743 03/18/20 0205 03/19/20 0512  HGB 10.4*   < >  --   --   --  10.4*  --  9.9*  --   HCT 33.0*  --   --   --   --  33.1*  --  30.7*  --   PLT 275  --   --   --   --  239  --  247  --   LABPROT  --   --   --   --   --  14.5  --   --   --   INR  --   --   --   --   --  1.2  --   --   --   HEPARINUNFRC  --   --   --   --   --   --  0.68 0.63 0.69  CREATININE  --    < >   < >  --   --  0.88  --  1.12 1.17  TROPONINIHS 159*  --   --  244* 294*  --   --   --   --    < > = values in this interval not displayed.    Estimated Creatinine Clearance: 50 mL/min (by C-G formula based on SCr of 1.17 mg/dL).   Medical History: Past Medical History:  Diagnosis Date  . (HFpEF) heart failure with improved ejection fraction ([place])    a. 08/2017 Echo: EF 45-50%; b. 10/2019 Echo: EF 55-60%. No rwma, mild LVH, Gr2 DD, RVSP 47.52mmHg. Mod dil LA. Mod MR. Asc Ao 20mm.  . Arthritis   . Benign prostate hyperplasia    lower urinary tract symptoms  . Cataracts, bilateral   . COPD (chronic obstructive pulmonary disease) ([place])   . DDD (degenerative disc disease), lumbar   . Glaucoma   . H/O carpal tunnel syndrome   . History of osteomyelitis 1953   right leg  . Hypertension   . Moderate mitral regurgitation    a. 10/2019 Echo: Mod MR.  Marland Kitchen PAD (peripheral artery disease) ([place])    a. 08/2017 LE ABI & Duplex: ABI R 0.60, L 1.07. 30-49% R popliteal and 50-74% R PT stenoses.  . Thrombocytopenia (H[facility])     Medications:  ASA 81mg  QD  Assessment: 86yo M with h/o HFpEF, COPD, PAD, & HTN who presents due to a 2-day history of worsening shortness of breath and cough.  Recent admit to hospital for hip surgery. Currently indicated for  anticoagulation for atrial fibrillation and profile reviewed for medication interactions. INR 1.2; H/H 10.4/33.1; Plts 275>239 at admit. Trop 294.  Goal of Therapy:  Heparin level 0.3-0.7 units/ml Monitor platelets by anticoagulation protocol: Yes   Plan:  --11/28 at 0512 HL = 0.69, therapeutic x 3. Will maintain heparin infusion at 1300 units/hr --Re-check HL daily --Daily CBC per protocol  Hart Robinsons A 03/19/2020,5:53 AM

## 2020-03-19 NOTE — Progress Notes (Signed)
Progress Note  Patient Name: Raymond Barnett Date of Encounter: 03/19/2020  [facility] Cardiologist: Nelva Bush, MD   Subjective    No complaints this morning, Reports still has mild shortness of breath, on high flow nasal cannula oxygen Receiving IV Lasix twice daily, -3 L Telemetry, remains in atrial fibrillation rates in the 90s, blood pressure 696-789 systolic, diltiazem off since yesterday secondary to hypotension  Inpatient Medications    Scheduled Meds: . aspirin  81 mg Oral Daily  . calcium-vitamin D  1 tablet Oral Daily  . Chlorhexidine Gluconate Cloth  6 each Topical Daily  . cholecalciferol  1,000 Units Oral Daily  . docusate sodium  100 mg Oral BID  . finasteride  5 mg Oral Daily  . furosemide  40 mg Intravenous Q12H  . pantoprazole  40 mg Oral Daily  . polyethylene glycol  17 g Oral QHS  . potassium chloride  40 mEq Oral TID  . timolol  1 drop Both Eyes BID  . traZODone  100 mg Oral QHS   Continuous Infusions: . amiodarone 30 mg/hr (03/19/20 1347)  . heparin 1,300 Units/hr (03/19/20 1153)   PRN Meds: acetaminophen **OR** acetaminophen, ALPRAZolam, alum & mag hydroxide-simeth, bisacodyl, diphenhydrAMINE, fluticasone, ipratropium-albuterol, methocarbamol, ondansetron **OR** ondansetron (ZOFRAN) IV, polyvinyl alcohol, zolpidem   Vital Signs    Vitals:   03/19/20 0800 03/19/20 0900 03/19/20 1000 03/19/20 1113  BP: (!) 144/52 105/86 109/86 110/69  Pulse:  94    Resp: (!) 26 (!) 26 (!) 26 (!) 29  Temp:    (!) 97.5 F (36.4 C)  TempSrc:    Oral  SpO2: 90% 91% 97% 96%  Weight:      Height:        Intake/Output Summary (Last 24 hours) at 03/19/2020 1354 Last data filed at 03/19/2020 1153 Gross per 24 hour  Intake 1328.77 ml  Output 1700 ml  Net -371.23 ml   Last 3 Weights 02/26/2020 03/06/2020 03/05/2020  Weight (lbs) 205 lb 225 lb 12 oz 222 lb 7.1 oz  Weight (kg) 92.987 kg 102.4 kg 100.9 kg      Telemetry    Atrial fibrillation rate  90- Personally Reviewed  ECG     - Personally Reviewed  Physical Exam   GEN: No acute distress.  Mild confusion Neck:  Unable to estimate JVD Cardiac:  Irregularly irregular no murmurs, rubs, or gallops.  Respiratory: Scattered Rales bilaterally GI: Soft, nontender, non-distended  MS: No edema; No deformity. Neuro:  Nonfocal  Psych: Mild confusion, alert  Labs    High Sensitivity Troponin:   Recent Labs  Lab 03/03/20 0020 03/04/2020 0753 03/05/2020 1046 02/25/2020 1242  TROPONINIHS 49* 159* 244* 294*      Chemistry Recent Labs  Lab 03/08/2020 0920 03/21/2020 0920 03/17/20 0624 03/18/20 0205 03/19/20 0512  NA 132*   < > 135 134* 134*  K 4.2   < > 4.1 3.0* 3.1*  CL 94*   < > 94* 93* 93*  CO2 26   < > 27 28 28   GLUCOSE 128*   < > 111* 140* 128*  BUN 18   < > 24* 34* 45*  CREATININE 0.82   < > 0.88 1.12 1.17  CALCIUM 9.0   < > 9.1 8.9 8.6*  PROT 6.3*  --   --   --   --   ALBUMIN 3.1*  --   --   --   --   AST 57*  --   --   --   --  ALT 35  --   --   --   --   ALKPHOS 88  --   --   --   --   BILITOT 3.2*  --   --   --   --   GFRNONAA >60   < > >60 >60 60*  ANIONGAP 12   < > 14 13 13    < > = values in this interval not displayed.     Hematology Recent Labs  Lab 03/17/20 0624 03/18/20 0205 03/19/20 0512  WBC 15.4* 13.9* 15.8*  RBC 3.29* 3.12* 2.96*  HGB 10.4* 9.9* 9.6*  HCT 33.1* 30.7* 29.0*  MCV 100.6* 98.4 98.0  MCH 31.6 31.7 32.4  MCHC 31.4 32.2 33.1  RDW 23.4* 23.8* 24.0*  PLT 239 247 225    BNP Recent Labs  Lab 03/06/2020 0753  BNP 1,276.9*     DDimer No results for input(s): DDIMER in the last 168 hours.   Radiology      Cardiac Studies     Patient Profile     86 y.o.malewith history of chronic HFpEF and COPD, whom we have been asked to see due to acute respiratory failure with hypoxia secondary to a combination of acute on chronic HFpEF and pneumonia. Hospital course has been complicated by atrial fibrillation with rapid ventricular  response (new).   Assessment & Plan   Paroxysmal atrial fibrillation Possible contributor to his respiratory distress presentation -Has been on anticoagulation since start of his atrial fibrillation this admission -Started on amiodarone infusion yesterday in effort to restore normal sinus rhythm and given challenging rate control on diltiazem in the setting of hypotension -Would continue amiodarone, continue IV Lasix, continue anticoagulation -Left atrium only mildly dilated,moderate LVH with low normal LV function -He may require cardioversion this admission if respiratory distress persists versus outpatient option  Acute on chronic respiratory failure Reported in clinic 1 month ago having worsening shortness of breath -Bilateral pleural effusions noted on CT scan this admission May need thoracentesis if unable to get off high flow nasal cannula oxygen -Continue Lasix as renal function tolerates, of note BUN slowly climbing  Elevated troponin Demand ischemia in the setting of hypoxic respiratory failure, atrial fibrillation No plan for ischemic work-up at this time  Anemia Stable on anticoagulation   Total encounter time more than 25 minutes  Greater than 50% was spent in counseling and coordination of care with the patient   For questions or updates, please contact [facility] Please consult [email or web address] for contact info under        Signed, Ida Rogue, MD  03/19/2020, 1:54 PM

## 2020-03-19 NOTE — Progress Notes (Signed)
[facility] [place] NAME: Raymond Barnett    MR#:  [id number]  DATE OF BIRTH:  12/01/1929  SUBJECTIVE:  patient came in from [facility] with increasing shortness of breath and hypoxia. He was discharged on 16 November after hip fracture surgery.  Some intermittent confusion-- slept few hours per pt Cannot get comfortable in the bed Had BM today after enema On HFNC 40/45 sats 88-93% REVIEW OF SYSTEMS:   Review of Systems  Constitutional: Positive for malaise/fatigue. Negative for chills, fever and weight loss.  HENT: Negative for ear discharge, ear pain and nosebleeds.   Eyes: Negative for blurred vision, pain and discharge.  Respiratory: Positive for shortness of breath. Negative for sputum production, wheezing and stridor.   Cardiovascular: Negative for chest pain, palpitations, orthopnea and PND.  Gastrointestinal: Negative for abdominal pain, diarrhea, nausea and vomiting.  Genitourinary: Negative for frequency and urgency.  Musculoskeletal: Negative for back pain and joint pain.  Neurological: Positive for weakness. Negative for sensory change, speech change and focal weakness.  Psychiatric/Behavioral: Negative for depression and hallucinations. The patient is not nervous/anxious.    Tolerating Diet:yes Tolerating PT: SNF  DRUG ALLERGIES:  No Known Allergies  VITALS:  Blood pressure 110/69, pulse 94, temperature (!) 97.5 F (36.4 C), temperature source Oral, resp. rate (!) 29, height 5\' 10"  (1.778 m), weight 93 kg, SpO2 96 %.  PHYSICAL EXAMINATION:   Physical Exam  GENERAL:  86 y.o.-year-old patient lying in the bed with mild acute resp  distress. Appears chrnically ill HEENT: Head atraumatic, normocephalic. Oropharynx and nasopharynx clear.  HFNC +  LUNGS: decreasedbreath sounds bilaterally, no wheezing, bibasilar rales, no rhonchi. No use of accessory muscles of respiration.  CARDIOVASCULAR: S1, S2 normal. No murmurs,  rubs, or gallops. tachycardia ABDOMEN: Soft, nontender, nondistended. Bowel sounds present. No organomegaly or mass.  EXTREMITIES: No cyanosis, clubbing or edema b/l.    NEUROLOGIC: Cranial nerves II through XII are intact. No focal Motor or sensory deficits b/l.  weak PSYCHIATRIC:  patient is alert and oriented x 2 SKIN: No obvious rash, lesion, or ulcer.     LABORATORY PANEL:  CBC Recent Labs  Lab 03/19/20 0512  WBC 15.8*  HGB 9.6*  HCT 29.0*  PLT 225    Chemistries  Recent Labs  Lab 02/27/2020 0920 03/17/20 0624 03/19/20 0512  NA 132*   < > 134*  K 4.2   < > 3.1*  CL 94*   < > 93*  CO2 26   < > 28  GLUCOSE 128*   < > 128*  BUN 18   < > 45*  CREATININE 0.82   < > 1.17  CALCIUM 9.0   < > 8.6*  AST 57*  --   --   ALT 35  --   --   ALKPHOS 88  --   --   BILITOT 3.2*  --   --    < > = values in this interval not displayed.   Cardiac Enzymes No results for input(s): TROPONINI in the last 168 hours. RADIOLOGY:  ECHOCARDIOGRAM COMPLETE  Result Date: 03/17/2020    ECHOCARDIOGRAM REPORT   Patient Name:   Raymond Barnett Date of Exam: 03/17/2020 Medical Rec #:  [id number]      Height:       70.0 in Accession #:    [id number]     Weight:       205.0 lb Date of Birth:  03-09-1930  BSA:          2.109 m Patient Age:    86 years       BP:           133/74 mmHg Patient Gender: M              HR:           109 bpm. Exam Location:  [facility] Procedure: 2D Echo, Cardiac Doppler and Color Doppler Indications:     CHF- acute diastolic 132.44  History:         Patient has prior history of Echocardiogram examinations, most                  recent 11/11/2019. COPD and PAD; Risk Factors:Hypertension.                  Heart failure with improved ejection fraction Moderate                  mitral regurgitation.  Sonographer:     Sherrie Sport RDCS (AE) Referring Phys:  [id number] CHRISTOPHER END Diagnosing Phys: Kate Sable MD IMPRESSIONS  1. Left ventricular ejection fraction, by estimation, is 50 to  55%. The left ventricle has low normal function. The left ventricle has no regional wall motion abnormalities. There is moderate left ventricular hypertrophy. Left ventricular diastolic function could not be evaluated.  2. Right ventricular systolic function is low normal. The right ventricular size is mildly enlarged.  3. Left atrial size was mildly dilated.  4. Right atrial size was moderately dilated.  5. The mitral valve is degenerative. Trivial mitral valve regurgitation.  6. The aortic valve is tricuspid. Aortic valve regurgitation is mild. FINDINGS  Left Ventricle: Left ventricular ejection fraction, by estimation, is 50 to 55%. The left ventricle has low normal function. The left ventricle has no regional wall motion abnormalities. The left ventricular internal cavity size was normal in size. There is moderate left ventricular hypertrophy. Left ventricular diastolic function could not be evaluated. Right Ventricle: The right ventricular size is mildly enlarged. No increase in right ventricular wall thickness. Right ventricular systolic function is low normal. Left Atrium: Left atrial size was mildly dilated. Right Atrium: Right atrial size was moderately dilated. Pericardium: There is no evidence of pericardial effusion. Mitral Valve: The mitral valve is degenerative in appearance. Mild mitral annular calcification. Trivial mitral valve regurgitation. Tricuspid Valve: The tricuspid valve is normal in structure. Tricuspid valve regurgitation is not demonstrated. Aortic Valve: The aortic valve is tricuspid. Aortic valve regurgitation is mild. Aortic valve mean gradient measures 2.5 mmHg. Aortic valve peak gradient measures 4.7 mmHg. Aortic valve area, by VTI measures 4.48 cm. Pulmonic Valve: The pulmonic valve was normal in structure. Pulmonic valve regurgitation is not visualized. Aorta: The aortic root is normal in size and structure. IAS/Shunts: No atrial level shunt detected by color flow Doppler.  LEFT  VENTRICLE PLAX 2D LVIDd:         4.11 cm LVIDs:         3.07 cm LV PW:         1.65 cm LV IVS:        1.77 cm LVOT diam:     2.10 cm LV SV:         68 LV SV Index:   32 LVOT Area:     3.46 cm  RIGHT VENTRICLE RV Basal diam:  4.51 cm LEFT ATRIUM  Index       RIGHT ATRIUM           Index LA diam:      4.00 cm 1.90 cm/m  RA Area:     30.40 cm LA Vol (A2C): 48.3 ml 22.90 ml/m RA Volume:   109.00 ml 51.67 ml/m LA Vol (A4C): 71.8 ml 34.04 ml/m  AORTIC VALVE                   PULMONIC VALVE AV Area (Vmax):    3.19 cm    PV Vmax:        0.71 m/s AV Area (Vmean):   3.26 cm    PV Peak grad:   2.0 mmHg AV Area (VTI):     4.48 cm    RVOT Peak grad: 4 mmHg AV Vmax:           108.50 cm/s AV Vmean:          74.250 cm/s AV VTI:            0.152 m AV Peak Grad:      4.7 mmHg AV Mean Grad:      2.5 mmHg LVOT Vmax:         99.80 cm/s LVOT Vmean:        69.800 cm/s LVOT VTI:          0.196 m LVOT/AV VTI ratio: 1.29  AORTA Ao Root diam: 3.70 cm MITRAL VALVE                TRICUSPID VALVE MV Area (PHT): 4.06 cm     TR Peak grad:   19.5 mmHg MV Decel Time: 187 msec     TR Vmax:        221.00 cm/s MV E velocity: 104.00 cm/s                             SHUNTS                             Systemic VTI:  0.20 m                             Systemic Diam: 2.10 cm Kate Sable MD Electronically signed by Kate Sable MD Signature Date/Time: 03/17/2020/2:43:01 PM    Final    ASSESSMENT AND PLAN:  JAFAR POFFENBERGER is a 86 y.o. male with medical history significant for chronic diastolic dysfunction CHF, COPD, status post recent right total hip arthroplasty and peripheral arterial disease who presents from the skilled nursing facility for evaluation of shortness of breath.  Shortness of breath is worse with any form of exertion and is associated with a cough productive of clear phlegm.  He has no lower extremity swelling. He denies having any orthopnea.  Patient states that his movement has been limited due to shortness  of breath as well as pain in his hip.  Acute on chronic hypoxic respiratory failure secondary to CHF acute on chronic diastolic congestive heart failure -- patient came in from [facility] with sats 82% on room air. He was placed on not rebreather -- currently on heated high flow nasal cannula -- IV Lasix-- urine output 7.2 L -- appreciate [facility] cardiology input -- elevated BNP --  daily weight, input output -- repeat Echo shows EF 50-55% --BP soft --Hold  Ace inhibitor (lisinopril was held at d/c on nov 16th due to low BP)  Mild leukocytosis with questionable pneumonia on x-ray --clinically does not appear pneumonia -MRSA PCR negative-- DC vancomycin -- Pro calcitonin normal -pt remains afebrile and white count stable will discontinue antibiotics today -- patient does not have fever, no productive yellow cough.  Atrial fibrillation with RVR new onset in the setting of CHF -- on IV diltiazem drip per cardiology -- continue heparin drip given elevated CHADSVAC2 score --11/27--Per Dr Rockey Situ wean cardizem down and start IV amiodarone Bolus + gtt to see if pt converts to SR --11/28--cont amiodarone gtt and heparin gtt  Recent right hip fracture status post surgery -- patient went to [facility] November 16 --Pt recs rehab  COPD chronic -PRN nebs  BPH continue Proscar  History of peripheral arterial disease continue aspirin  Glaucoma continue Timolol eye drops  Procedures:none Family communication : Consults :cardiology [facility] CODE STATUS: DNR prior to arrival DVT Prophylaxis : heparin drip  Status is: Inpatient  Remains inpatient appropriate because:IV treatments appropriate due to intensity of illness or inability to take PO   Dispo: The patient is from: SNF              Anticipated d/c is to: SNF              Anticipated d/c date is: 3 days              Patient currently is not medically stable to d/c.  Patient admitted with acute on chronic hypoxic  respiratory failure currently requiring heated high flow nasal cannula. Getting aggressive IV Lasix for CHF treatment. IV amiodarone gtt and heparin gtt  TOC for d/c planning--per dter pt does not want to return to [facility] THIS PATIENT: 25 minutes.  >50% time spent on counselling and coordination of care  Note: This dictation was prepared with Dragon dictation along with smaller phrase technology. Any transcriptional errors that result from this process are unintentional.  Fritzi Mandes M.D    [facility]   CC: Primary care physician; [name], DOPatient ID: [name], male   DOB: 02/21/1930, 86 y.o.   MRN: [id number]

## 2020-03-19 NOTE — Progress Notes (Signed)
PHARMACY CONSULT NOTE - FOLLOW UP  Pharmacy Consult for Electrolyte Monitoring and Replacement   Recent Labs: Potassium (mmol/L)  Date Value  03/19/2020 3.1 (L)   Calcium (mg/dL)  Date Value  03/19/2020 8.6 (L)   Albumin (g/dL)  Date Value  02/21/2020 3.1 (L)  02/16/2015 3.8   Phosphorus (mg/dL)  Date Value  11/25/2019 3.3   Sodium (mmol/L)  Date Value  03/19/2020 134 (L)  12/29/2019 136     Assessment: Pt K+ did not respond to 66mEq q2h x2 doses yesterday (36mEq total). K+ @3 -3.1 & remains on lasix 40 IV BID.  Goal of Therapy:  Electrolytes WNL  Plan:  Will replete with total 169mEq in divided doses today. 42mEq PO TID x3 doses.  Lorna Dibble ,PharmD Clinical Pharmacist 03/19/2020 8:52 AM

## 2020-03-20 ENCOUNTER — Inpatient Hospital Stay: Payer: Medicare Other

## 2020-03-20 DIAGNOSIS — I5031 Acute diastolic (congestive) heart failure: Secondary | ICD-10-CM | POA: Diagnosis not present

## 2020-03-20 DIAGNOSIS — J9601 Acute respiratory failure with hypoxia: Secondary | ICD-10-CM | POA: Diagnosis not present

## 2020-03-20 DIAGNOSIS — I4891 Unspecified atrial fibrillation: Secondary | ICD-10-CM | POA: Diagnosis not present

## 2020-03-20 DIAGNOSIS — I4819 Other persistent atrial fibrillation: Secondary | ICD-10-CM | POA: Diagnosis not present

## 2020-03-20 DIAGNOSIS — I10 Essential (primary) hypertension: Secondary | ICD-10-CM | POA: Diagnosis not present

## 2020-03-20 LAB — HEPATIC FUNCTION PANEL
ALT: 34 U/L (ref 0–44)
AST: 48 U/L — ABNORMAL HIGH (ref 15–41)
Albumin: 2.7 g/dL — ABNORMAL LOW (ref 3.5–5.0)
Alkaline Phosphatase: 93 U/L (ref 38–126)
Bilirubin, Direct: 0.7 mg/dL — ABNORMAL HIGH (ref 0.0–0.2)
Indirect Bilirubin: 1.3 mg/dL — ABNORMAL HIGH (ref 0.3–0.9)
Total Bilirubin: 2 mg/dL — ABNORMAL HIGH (ref 0.3–1.2)
Total Protein: 5.7 g/dL — ABNORMAL LOW (ref 6.5–8.1)

## 2020-03-20 LAB — CBC
HCT: 29.1 % — ABNORMAL LOW (ref 39.0–52.0)
Hemoglobin: 8.9 g/dL — ABNORMAL LOW (ref 13.0–17.0)
MCH: 31 pg (ref 26.0–34.0)
MCHC: 30.6 g/dL (ref 30.0–36.0)
MCV: 101.4 fL — ABNORMAL HIGH (ref 80.0–100.0)
Platelets: 209 10*3/uL (ref 150–400)
RBC: 2.87 MIL/uL — ABNORMAL LOW (ref 4.22–5.81)
RDW: 24.2 % — ABNORMAL HIGH (ref 11.5–15.5)
WBC: 12.9 10*3/uL — ABNORMAL HIGH (ref 4.0–10.5)
nRBC: 2.2 % — ABNORMAL HIGH (ref 0.0–0.2)

## 2020-03-20 LAB — EXPECTORATED SPUTUM ASSESSMENT W GRAM STAIN, RFLX TO RESP C

## 2020-03-20 LAB — AMYLASE, PLEURAL OR PERITONEAL FLUID: Amylase, Fluid: 18 U/L

## 2020-03-20 LAB — LACTATE DEHYDROGENASE, PLEURAL OR PERITONEAL FLUID: LD, Fluid: 133 U/L — ABNORMAL HIGH (ref 3–23)

## 2020-03-20 LAB — GLUCOSE, PLEURAL OR PERITONEAL FLUID: Glucose, Fluid: 153 mg/dL

## 2020-03-20 LAB — BODY FLUID CELL COUNT WITH DIFFERENTIAL
Eos, Fluid: 1 %
Lymphs, Fluid: 56 %
Monocyte-Macrophage-Serous Fluid: 3 %
Neutrophil Count, Fluid: 40 %
Total Nucleated Cell Count, Fluid: 133 cu mm

## 2020-03-20 LAB — HEPARIN LEVEL (UNFRACTIONATED)
Heparin Unfractionated: 0.76 IU/mL — ABNORMAL HIGH (ref 0.30–0.70)
Heparin Unfractionated: 0.74 IU/mL — ABNORMAL HIGH (ref 0.30–0.70)

## 2020-03-20 LAB — PROTIME-INR
INR: 1.2 (ref 0.8–1.2)
Prothrombin Time: 15.1 seconds (ref 11.4–15.2)

## 2020-03-20 LAB — POTASSIUM: Potassium: 3.7 mmol/L (ref 3.5–5.1)

## 2020-03-20 LAB — HEMOGLOBIN: Hemoglobin: 8.9 g/dL — ABNORMAL LOW (ref 13.0–17.0)

## 2020-03-20 LAB — STREP PNEUMONIAE URINARY ANTIGEN: Strep Pneumo Urinary Antigen: NEGATIVE

## 2020-03-20 LAB — FIBRINOGEN: Fibrinogen: 513 mg/dL — ABNORMAL HIGH (ref 210–475)

## 2020-03-20 LAB — MAGNESIUM: Magnesium: 2 mg/dL (ref 1.7–2.4)

## 2020-03-20 MED ORDER — SODIUM CHLORIDE 0.9 % IV SOLN
2.0000 g | INTRAVENOUS | Status: AC
  Administered 2020-03-20 – 2020-03-24 (×5): 2 g via INTRAVENOUS
  Filled 2020-03-20: qty 20
  Filled 2020-03-20 (×3): qty 2
  Filled 2020-03-20: qty 20

## 2020-03-20 MED ORDER — ADULT MULTIVITAMIN W/MINERALS CH
1.0000 | ORAL_TABLET | Freq: Every day | ORAL | Status: DC
  Administered 2020-03-21 – 2020-03-25 (×5): 1 via ORAL
  Filled 2020-03-20 (×5): qty 1

## 2020-03-20 MED ORDER — FUROSEMIDE 10 MG/ML IJ SOLN
60.0000 mg | Freq: Two times a day (BID) | INTRAMUSCULAR | Status: DC
  Administered 2020-03-20 – 2020-03-21 (×2): 60 mg via INTRAVENOUS
  Filled 2020-03-20 (×2): qty 6

## 2020-03-20 MED ORDER — SODIUM CHLORIDE 0.9 % IV SOLN
1.0000 g | INTRAVENOUS | Status: DC
  Filled 2020-03-20: qty 10

## 2020-03-20 MED ORDER — POTASSIUM CHLORIDE 20 MEQ PO PACK
40.0000 meq | PACK | Freq: Once | ORAL | Status: AC
  Administered 2020-03-20: 40 meq via ORAL
  Filled 2020-03-20: qty 2

## 2020-03-20 MED ORDER — ENSURE ENLIVE PO LIQD
237.0000 mL | Freq: Three times a day (TID) | ORAL | Status: DC
  Administered 2020-03-20 – 2020-03-24 (×10): 237 mL via ORAL

## 2020-03-20 MED ORDER — AZITHROMYCIN 500 MG PO TABS
500.0000 mg | ORAL_TABLET | Freq: Every day | ORAL | Status: DC
  Administered 2020-03-20 – 2020-03-24 (×5): 500 mg via ORAL
  Filled 2020-03-20 (×6): qty 1

## 2020-03-20 MED ORDER — AMIODARONE HCL 200 MG PO TABS
400.0000 mg | ORAL_TABLET | Freq: Two times a day (BID) | ORAL | Status: DC
  Administered 2020-03-20 – 2020-03-25 (×12): 400 mg via ORAL
  Filled 2020-03-20 (×12): qty 2

## 2020-03-20 MED ORDER — POTASSIUM CHLORIDE CRYS ER 20 MEQ PO TBCR
40.0000 meq | EXTENDED_RELEASE_TABLET | Freq: Once | ORAL | Status: AC
  Administered 2020-03-20: 40 meq via ORAL
  Filled 2020-03-20: qty 2

## 2020-03-20 MED ORDER — POTASSIUM CHLORIDE 20 MEQ PO PACK: 40.0000 meq | PACK | Freq: Once | ORAL | Status: DC

## 2020-03-20 NOTE — Consult Note (Addendum)
CRITICAL CARE CONSULT      Referring physician: Dr Posey Pronto  CHIEF COMPLAINT:   Acute hypoxemic respiratory failure   SUBJECTIVE FINDINGS & SIGNIFICANT EVENTS   75M from [facility] with hx of CHF EF 45, COPD, Right Hip fracture s/p surg coming in for worsening dyspnea and hypoxemia. Per daughter Mechele Claude patient has been declining for appx several months. He was found to have moderate pleural effusions bilaterally and multifocal pneumonia suggestive of CHF with possible overlying infection. BNP was >1200.    03/20/20- Met with daughter, reviewed findings and care plan. Discussed short term plan with cardiology and hospitalist team.   PAST MEDICAL HISTORY   Past Medical History:  Diagnosis Date  . (HFpEF) heart failure with improved ejection fraction ([place])    a. 08/2017 Echo: EF 45-50%; b. 10/2019 Echo: EF 55-60%. No rwma, mild LVH, Gr2 DD, RVSP 47.44mHg. Mod dil LA. Mod MR. Asc Ao 442m  . Arthritis   . Benign prostate hyperplasia    lower urinary tract symptoms  . Cataracts, bilateral   . COPD (chronic obstructive pulmonary disease) ([place]  . DDD (degenerative disc disease), lumbar   . Glaucoma   . H/O carpal tunnel syndrome   . History of osteomyelitis 1953   right leg  . Hypertension   . Moderate mitral regurgitation    a. 10/2019 Echo: Mod MR.  . Marland KitchenAD (peripheral artery disease) ([place]   a. 08/2017 LE ABI & Duplex: ABI R 0.60, L 1.07. 30-49% R popliteal and 50-74% R PT stenoses.  . Thrombocytopenia ([place]     SURGICAL HISTORY   Past Surgical History:  Procedure Laterality Date  . CARPAL TUNNEL RELEASE Left   . CARPAL TUNNEL RELEASE Right   . COLONOSCOPY  2015   Dr. [name]. HIP ARTHROPLASTY Right 03/03/2020   Procedure: ARTHROPLASTY BIPOLAR HIP (HEMIARTHROPLASTY);  Surgeon: MeHessie KnowsMD;  Location:  [facility] ORS;  Service: Orthopedics;  Laterality: Right;  . LEG SURGERY Right 1953  . SKIN GRAFT Right 05/01/2017   [facility] TOTAL KNEE ARTHROPLASTY Right      FAMILY HISTORY   Family History  Problem Relation Age of Onset  . Colon cancer Mother 5147. Thrombosis Father 6449. Heart disease Father        Heart valve problem  . AAA (abdominal aortic aneurysm) Father   . Hypertension Sister   . Hyperlipidemia Sister   . Hypertension Sister   . Hyperlipidemia Sister   . Hypertension Sister   . Hyperlipidemia Sister      SOCIAL HISTORY   Social History   Tobacco Use  . Smoking status: Former Smoker    Packs/day: 1.00    Years: 30.00    Pack years: 30.00    Types: Cigarettes    Quit date: 1995    Years since quitting: 26.9  . Smokeless tobacco: Never Used  Vaping Use  . Vaping Use: Never used  Substance Use Topics  . Alcohol use: Not Currently    Comment: occasionally wine   . Drug use: No     MEDICATIONS   Current Medication:  Current Facility-Administered Medications:  .  acetaminophen (TYLENOL) tablet 650 mg, 650 mg, Oral, Q6H PRN, 650 mg at 03/20/20 0044 **OR** acetaminophen (TYLENOL) suppository 650 mg, 650 mg, Rectal, Q6H PRN, Agbata, Tochukwu, MD .  alum & mag hydroxide-simeth (MAALOX/MYLANTA) 200-200-20 MG/5ML suspension 30 mL, 30 mL, Oral, Q4H PRN, Agbata, Tochukwu, MD .  amiodarone (  PACERONE) tablet 400 mg, 400 mg, Oral, BID, Arida, Muhammad A, MD, 400 mg at 03/20/20 1408 .  azithromycin (ZITHROMAX) tablet 500 mg, 500 mg, Oral, Daily, Fritzi Mandes, MD, 500 mg at 03/20/20 1504 .  bisacodyl (DULCOLAX) suppository 10 mg, 10 mg, Rectal, Daily PRN, Agbata, Tochukwu, MD, 10 mg at 03/17/20 1436 .  calcium-vitamin D (OSCAL WITH D) 500-200 MG-UNIT per tablet 1 tablet, 1 tablet, Oral, Daily, Agbata, Tochukwu, MD, 1 tablet at 03/20/20 0936 .  cefTRIAXone (ROCEPHIN) 2 g in sodium chloride 0.9 % 100 mL IVPB, 2 g, Intravenous, Q24H, Fritzi Mandes, MD, Last Rate:  200 mL/hr at 03/20/20 1415, 2 g at 03/20/20 1415 .  Chlorhexidine Gluconate Cloth 2 % PADS 6 each, 6 each, Topical, Daily, Fritzi Mandes, MD, 6 each at 03/19/20 1650 .  cholecalciferol (VITAMIN D) tablet 1,000 Units, 1,000 Units, Oral, Daily, Agbata, Tochukwu, MD, 1,000 Units at 03/20/20 0936 .  docusate sodium (COLACE) capsule 100 mg, 100 mg, Oral, BID, Agbata, Tochukwu, MD, 100 mg at 03/20/20 0936 .  feeding supplement (ENSURE ENLIVE / ENSURE PLUS) liquid 237 mL, 237 mL, Oral, TID BM, Fritzi Mandes, MD, 237 mL at 03/20/20 1506 .  finasteride (PROSCAR) tablet 5 mg, 5 mg, Oral, Daily, Agbata, Tochukwu, MD, 5 mg at 03/20/20 0936 .  fluticasone (FLONASE) 50 MCG/ACT nasal spray 2 spray, 2 spray, Each Nare, Daily PRN, Agbata, Tochukwu, MD .  furosemide (LASIX) injection 60 mg, 60 mg, Intravenous, Q12H, Arida, Muhammad A, MD .  heparin ADULT infusion 100 units/mL (25000 units/242m sodium chloride 0.45%), 1,050 Units/hr, Intravenous, Continuous, GDallie Piles RPH, Last Rate: 10.5 mL/hr at 03/20/20 1501, 1,050 Units/hr at 03/20/20 1501 .  ipratropium-albuterol (DUONEB) 0.5-2.5 (3) MG/3ML nebulizer solution 3 mL, 3 mL, Nebulization, Q6H PRN, Agbata, Tochukwu, MD .  methocarbamol (ROBAXIN) tablet 500 mg, 500 mg, Oral, Q6H PRN, Agbata, Tochukwu, MD .  [Derrill MemoON 03/21/2020] multivitamin with minerals tablet 1 tablet, 1 tablet, Oral, Daily, PPosey Pronto Sona, MD .  ondansetron (ZOFRAN) tablet 4 mg, 4 mg, Oral, Q6H PRN **OR** ondansetron (ZOFRAN) injection 4 mg, 4 mg, Intravenous, Q6H PRN, Agbata, Tochukwu, MD, 4 mg at 03/20/20 0742 .  pantoprazole (PROTONIX) EC tablet 40 mg, 40 mg, Oral, Daily, Agbata, Tochukwu, MD, 40 mg at 03/20/20 0936 .  polyethylene glycol (MIRALAX / GLYCOLAX) packet 17 g, 17 g, Oral, QHS, Agbata, Tochukwu, MD, 17 g at 03/19/20 2226 .  polyvinyl alcohol (LIQUIFILM TEARS) 1.4 % ophthalmic solution 1 drop, 1 drop, Both Eyes, PRN, Agbata, Tochukwu, MD .  potassium chloride SA (KLOR-CON) CR tablet 40  mEq, 40 mEq, Oral, Once, GDallie Piles RPH .  timolol (TIMOPTIC) 0.5 % ophthalmic solution 1 drop, 1 drop, Both Eyes, BID, Agbata, Tochukwu, MD, 1 drop at 03/20/20 0938 .  traZODone (DESYREL) tablet 100 mg, 100 mg, Oral, QHS, Agbata, Tochukwu, MD, 100 mg at 03/19/20 2226 .  zolpidem (AMBIEN) tablet 5 mg, 5 mg, Oral, QHS PRN, PFritzi Mandes MD    ALLERGIES   Patient has no known allergies.    REVIEW OF SYSTEMS     10 point ROS done and is negative except for dyspnea  PHYSICAL EXAMINATION   Vitals:   03/20/20 1200 03/20/20 1300  BP: 97/76 105/64  Pulse: 92 95  Resp: (!) 24 (!) 23  Temp: 97.6 F (36.4 C)   SpO2: 97% 93%    GENERAL:age appropirate HEAD: Normocephalic, atraumatic.  EYES: Pupils equal, round, reactive to light.  No scleral icterus.  MOUTH: Moist mucosal  membrane. NECK: Supple. No thyromegaly. No nodules. No JVD.  PULMONARY: bilateral rhonchi CARDIOVASCULAR: S1 and S2. Regular rate and rhythm. No murmurs, rubs, or gallops.  GASTROINTESTINAL: Soft, nontender, non-distended. No masses. Positive bowel sounds. No hepatosplenomegaly.  MUSCULOSKELETAL: No swelling, clubbing, or edema.  NEUROLOGIC: Mild distress due to acute illness SKIN:intact,warm,dry   PERTINENT DATA      Lines / Drains: PIV x 2  Cultures / Sepsis markers: -Blood cultures -negative x 4d -COIVID -negative -FLUa&b - negative -RSV- negative  Antibiotics: Rocephin and Zithromax - started 11/29   Protocols / Consultants: Cardio, pccm, hospitalist, palliative  Tests / Events: Thoracentesis - 11/29 US abdomen -11/29 -RVP-11/29 -respiratory culture -11/29     Infusions: . cefTRIAXone (ROCEPHIN)  IV 2 g (03/20/20 1415)  . heparin 1,050 Units/hr (03/20/20 1501)   Scheduled Medications: . amiodarone  400 mg Oral BID  . azithromycin  500 mg Oral Daily  . calcium-vitamin D  1 tablet Oral Daily  . Chlorhexidine Gluconate Cloth  6 each Topical Daily  . cholecalciferol  1,000  Units Oral Daily  . docusate sodium  100 mg Oral BID  . feeding supplement  237 mL Oral TID BM  . finasteride  5 mg Oral Daily  . furosemide  60 mg Intravenous Q12H  . [START ON 03/21/2020] multivitamin with minerals  1 tablet Oral Daily  . pantoprazole  40 mg Oral Daily  . polyethylene glycol  17 g Oral QHS  . potassium chloride  40 mEq Oral Once  . timolol  1 drop Both Eyes BID  . traZODone  100 mg Oral QHS   PRN Medications: acetaminophen **OR** acetaminophen, alum & mag hydroxide-simeth, bisacodyl, fluticasone, ipratropium-albuterol, methocarbamol, ondansetron **OR** ondansetron (ZOFRAN) IV, polyvinyl alcohol, zolpidem Hemodynamic parameters:   Intake/Output: 11/28 0701 - 11/29 0700 In: 949.8 [P.O.:240; I.V.:709.8] Out: 1350 [ZYSAY:3016]  Ventilator  Settings: FiO2 (%):  [70 %-75 %] 70 %   Other Labs:    LAB RESULTS:  Basic Metabolic Panel: Recent Labs  Lab 02/24/2020 0920 03/11/2020 0920 03/17/20 0109 03/17/20 3235 03/18/20 0205 03/18/20 0205 03/19/20 0512 03/20/20 0531  NA 132*  --  135  --  134*  --  134*  --   K 4.2   < > 4.1   < > 3.0*   < > 3.1* 3.7  CL 94*  --  94*  --  93*  --  93*  --   CO2 26  --  27  --  28  --  28  --   GLUCOSE 128*  --  111*  --  140*  --  128*  --   BUN 18  --  24*  --  34*  --  45*  --   CREATININE 0.82  --  0.88  --  1.12  --  1.17  --   CALCIUM 9.0  --  9.1  --  8.9  --  8.6*  --   MG  --   --   --   --   --   --   --  2.0   < > = values in this interval not displayed.   Liver Function Tests: Recent Labs  Lab 03/17/2020 0920 03/20/20 0716  AST 57* 48*  ALT 35 34  ALKPHOS 88 93  BILITOT 3.2* 2.0*  PROT 6.3* 5.7*  ALBUMIN 3.1* 2.7*   No results for input(s): LIPASE, AMYLASE in the last 168 hours. No results for input(s): AMMONIA in the last 168  hours. CBC: Recent Labs  Lab 02/22/2020 0753 03/17/20 0624 03/18/20 0205 03/19/20 0512 03/20/20 0531  WBC 15.8* 15.4* 13.9* 15.8* 12.9*  NEUTROABS 13.6*  --   --   --   --     HGB 10.4* 10.4* 9.9* 9.6* 8.9*  8.9*  HCT 33.0* 33.1* 30.7* 29.0* 29.1*  MCV 100.3* 100.6* 98.4 98.0 101.4*  PLT 275 239 247 225 209   Cardiac Enzymes: No results for input(s): CKTOTAL, CKMB, CKMBINDEX, TROPONINI in the last 168 hours. BNP: Invalid input(s): POCBNP CBG: Recent Labs  Lab 03/15/2020 1202  GLUCAP 116*     IMAGING RESULTS:  Imaging: DG Chest Port 1 View  Result Date: 03/20/2020 CLINICAL DATA:  Respiratory distress. EXAM: PORTABLE CHEST 1 VIEW COMPARISON:  02/29/2020 and chest CT 03/20/2020 FINDINGS: A single view of the chest demonstrates patchy bilateral lung densities. Evidence for bilateral pleural effusions. Airspace densities in the right mid lung has slightly worsened but slightly decreased in the left mid lung. Heart size is slightly prominent but accentuated by technique. Heart size is grossly stable. Negative for pneumothorax. IMPRESSION: 1. Persistent bilateral lung densities that have slightly changed as described. Findings are suggestive for asymmetric pulmonary edema. Infection cannot be excluded. 2. Bilateral pleural effusions. Electronically Signed   By: Markus Daft M.D.   On: 03/20/2020 10:40      ASSESSMENT AND PLAN    -Multidisciplinary rounds held today  Acute Hypoxic Respiratory Failure -due to acute decompensated systolic CHF with EF <87% -complicated by pneumonia of yet unclear etiology, bilateral atelectasis and bilateral pleural effusions -continue diuresis - plan to increase loop dose for now, patient with mild pre-renal azotemia  -thoracentesis - IR consult -RVP -resp culture -empiric CAP regimen -RHC plan per cardiology - appreciate input   New onset Atrial fibrillation   -TSH is normal  - possible due to infection/sepsis vs worsening heart failure  -on heparin gtt  - on amio and digoxin  -cardiology on case - appreciate input   Chronic COPD  - no phelgm production on examination  - no signs of acute exacerbation on exam     Jaundice   - daughter at bedside also confirms that patient appears orange compared to his normal. Will continue to monitor LFTs and obtain US Liver    ID -continue IV abx as prescibed -follow up cultures  GI/Nutrition GI PROPHYLAXIS as indicated DIET-->TF's as tolerated Constipation protocol as indicated  ENDO - ICU hypoglycemic\Hyperglycemia protocol -check FSBS per protocol   ELECTROLYTES -follow labs as needed -replace as needed -pharmacy consultation   DVT/GI PRX ordered -SCDs  TRANSFUSIONS AS NEEDED MONITOR FSBS ASSESS the need for LABS as needed   Critical care provider statement:    Critical care time (minutes):  109   Critical care time was exclusive of:  Separately billable procedures and treating other patients   Critical care was necessary to treat or prevent imminent or life-threatening deterioration of the following conditions:  acute hypoxemic respiratory failure, acute decompensated CHF, new onset Atrial fibrillation, multple comorbid conditions   Critical care was time spent personally by me on the following activities:  Development of treatment plan with patient or surrogate, discussions with consultants, evaluation of patient's response to treatment, examination of patient, obtaining history from patient or surrogate, ordering and performing treatments and interventions, ordering and review of laboratory studies and re-evaluation of patient's condition.  I assumed direction of critical care for this patient from another provider in my specialty: no    This  document was prepared using Systems analyst and may include unintentional dictation errors.    Ottie Glazier, M.D.  Division of [place]

## 2020-03-20 NOTE — Progress Notes (Addendum)
Progress Note  Patient Name: Raymond Barnett Date of Encounter: 03/20/2020  Primary Cardiologist: Nelva Bush, MD   Subjective   Feels uncomfortable.  At times states that he is not short of breath, though at other times states that he is still short of breath.  No chest pain.  He cannot feel his heart racing or skipping beats.  Inpatient Medications    Scheduled Meds: . aspirin  81 mg Oral Daily  . calcium-vitamin D  1 tablet Oral Daily  . Chlorhexidine Gluconate Cloth  6 each Topical Daily  . cholecalciferol  1,000 Units Oral Daily  . docusate sodium  100 mg Oral BID  . finasteride  5 mg Oral Daily  . furosemide  40 mg Intravenous Q12H  . pantoprazole  40 mg Oral Daily  . polyethylene glycol  17 g Oral QHS  . potassium chloride  40 mEq Oral Once  . timolol  1 drop Both Eyes BID  . traZODone  100 mg Oral QHS   Continuous Infusions: . amiodarone 30 mg/hr (03/20/20 0600)  . heparin 1,200 Units/hr (03/20/20 0708)   PRN Meds: acetaminophen **OR** acetaminophen, ALPRAZolam, alum & mag hydroxide-simeth, bisacodyl, diphenhydrAMINE, fluticasone, ipratropium-albuterol, methocarbamol, ondansetron **OR** ondansetron (ZOFRAN) IV, polyvinyl alcohol, zolpidem   Vital Signs    Vitals:   03/20/20 0400 03/20/20 0500 03/20/20 0600 03/20/20 0707  BP: (!) 99/54 101/63 106/61 104/66  Pulse: 83 88 97 (!) 101  Resp: 13 14 17 17   Temp: (!) 97.5 F (36.4 C)     TempSrc: Oral     SpO2: 90% 94% 97% 95%  Weight:      Height:        Intake/Output Summary (Last 24 hours) at 03/20/2020 0807 Last data filed at 03/20/2020 0708 Gross per 24 hour  Intake 898.36 ml  Output 1100 ml  Net -201.64 ml   Last 3 Weights 03/07/2020 03/06/2020 03/05/2020  Weight (lbs) 205 lb 225 lb 12 oz 222 lb 7.1 oz  Weight (kg) 92.987 kg 102.4 kg 100.9 kg      Telemetry    Atrial fibrillation, ventricular rates 90s to 110s, PVCs- Personally Reviewed  ECG    No new tracings- Personally  Reviewed  Physical Exam   GEN: No acute distress.   Neck: Elevated JVP ~10cm Cardiac:  IRIR and tachycardic, no murmurs, rubs, or gallops.  Respiratory: Bibasilar reduced breath sounds. GI: Soft, nontender, non-distended  MS: Minimal to trace bilateral nonpitting edema; No deformity. Neuro:  Nonfocal  Psych: Normal affect   Labs    High Sensitivity Troponin:   Recent Labs  Lab 03/03/20 0020 03/09/2020 0753 03/17/2020 1046 03/18/2020 1242  TROPONINIHS 49* 159* 244* 294*      Chemistry Recent Labs  Lab 02/29/2020 0920 03/17/2020 0920 03/17/20 6962 03/17/20 0624 03/18/20 0205 03/19/20 0512 03/20/20 0531  NA 132*   < > 135  --  134* 134*  --   K 4.2   < > 4.1   < > 3.0* 3.1* 3.7  CL 94*   < > 94*  --  93* 93*  --   CO2 26   < > 27  --  28 28  --   GLUCOSE 128*   < > 111*  --  140* 128*  --   BUN 18   < > 24*  --  34* 45*  --   CREATININE 0.82   < > 0.88  --  1.12 1.17  --   CALCIUM 9.0   < >  9.1  --  8.9 8.6*  --   PROT 6.3*  --   --   --   --   --   --   ALBUMIN 3.1*  --   --   --   --   --   --   AST 57*  --   --   --   --   --   --   ALT 35  --   --   --   --   --   --   ALKPHOS 88  --   --   --   --   --   --   BILITOT 3.2*  --   --   --   --   --   --   GFRNONAA >60   < > >60  --  >60 60*  --   ANIONGAP 12   < > 14  --  13 13  --    < > = values in this interval not displayed.     Hematology Recent Labs  Lab 03/17/20 0624 03/17/20 0624 03/18/20 0205 03/19/20 0512 03/20/20 0531  WBC 15.4*  --  13.9* 15.8*  --   RBC 3.29*  --  3.12* 2.96*  --   HGB 10.4*   < > 9.9* 9.6* 8.9*  HCT 33.1*  --  30.7* 29.0*  --   MCV 100.6*  --  98.4 98.0  --   MCH 31.6  --  31.7 32.4  --   MCHC 31.4  --  32.2 33.1  --   RDW 23.4*  --  23.8* 24.0*  --   PLT 239  --  247 225  --    < > = values in this interval not displayed.    BNP Recent Labs  Lab 02/26/2020 0753  BNP 1,276.9*     DDimer No results for input(s): DDIMER in the last 168 hours.   Radiology    No  results found.  Cardiac Studies   Echo 03/17/20  1. Left ventricular ejection fraction, by estimation, is 50 to 55%. The  left ventricle has low normal function. The left ventricle has no regional  wall motion abnormalities. There is moderate left ventricular hypertrophy.  Left ventricular diastolic  function could not be evaluated.  2. Right ventricular systolic function is low normal. The right  ventricular size is mildly enlarged.  3. Left atrial size was mildly dilated.  4. Right atrial size was moderately dilated.  5. The mitral valve is degenerative. Trivial mitral valve regurgitation.  6. The aortic valve is tricuspid. Aortic valve regurgitation is mild.   Echo 11/11/2019 1. Left ventricular ejection fraction, by estimation, is 55 to 60%. The  left ventricle has normal function. The left ventricle has no regional  wall motion abnormalities. There is mild left ventricular hypertrophy.  Left ventricular diastolic parameters  are consistent with Grade II diastolic dysfunction (pseudonormalization).  2. Right ventricular systolic function is normal. The right ventricular  size is normal. There is moderately elevated pulmonary artery systolic  pressure. The estimated right ventricular systolic pressure is 79.0 mmHg.  3. Left atrial size was moderately dilated.  4. Moderate mitral valve regurgitation.  5. There is mild dilatation of the ascending aorta measuring 41 mm.   Patient Profile     86 y.o. male hx of chronic dyspnea, heart failure with improved ejection fraction (55-60 10/2019), peripheral arterial disease with abnormal right lower extremity ABIs, remote osteomyelitis, COPD,  thrombocytopenia, BPH, arthritis, prior right TKA, hypertension, and who is being seen today for the evaluation of acute respiratory failure with hypoxia secondary to combination of HFpEF, pna, and complicated by Afib with RVR (new).   Assessment & Plan    Newly diagnosed Paroxysmal Afib  with RVR -- Rate control has been limited by BP. Current ventricular rate 90-110. Goal ventricular rate below 110bpm.  --CHA2DS2VASc score of at least 5 (CHF, HTN, agex2, vascular) with recommendation for indefinite anticoagulation.  Continue amiodarone for rate control.   Ideally, we would wean him off of this medication in the future; however, will continue for now given limited in options and do not recommend BB given soft BP.  Continue anticoagulation with IV heparin.  Transition to oral anticoagulation before discharge.  Consider holding ASA to reduce risk of bleeding.   No current plan for inpatient DCCV.  DCCV could be considered if remains in Afib and rates difficult to control. Per rounding team, he has been on anticoagulation since onset of Afib. At this time, however, breathing status not ideal for cardioversion / sedation. Will continue to reassess.  Acute on chronic HFpEF --Admitted 11/25 with respiratory failure in setting of pna and COPD with volume overload. SOB/DOE improved with restart of diuresis.  As previously noted, Afib could have contributed to his respiratory status as well.  Echo this admission with EF 50 to 55%.   Continue IV lasix with plan to transition to oral before discharge.   Thoracentesis a consideration for symptomatic relief and if unable to get off HF [place] O2.  Daily BMET. Cr 1.12  1.17; BUN 34  45.  Monitor I's/O's.   Net -400.2 cc yesterday with total output 1.350 L.  Daily weights.  Wts do not appear well documented this admission.  CHF education.  Hypokalemia --K 3.7.  Replete with goal 4.0. --Check Mg with goal 2.0.  Acute on chronic hypoxic respiratory failure/AE COPD/PNA --Continue HF O2. --Continue breathing treatments.  Elevated high-sensitivity troponin  H/o of coronary artery calcium on CT --No current chest pain. Previously declined outpatient ischemic work-up.  CAC by CT. EKG without acute ST/T changes. HS Tn minimally  elevated and flat trending.  Echo this admission 50 to 55% and NRWMA. Not consistent with ACS. --No plan for ischemic evaluation at this time. If chest pain in the outpatient setting, consider further ischemic work-up at that time and once recovered from hip surgery. Continue medical management.   Anemia --Hgb 8.9. --Monitor very closely on anticoagulation. --Daily CBC.  PAD --Previously noted reduced right ABIs.   --Not on a statin, per pt preference.   For questions or updates, please contact [place] Please consult [email or web address] for contact info under        Signed, Arvil Chaco, PA-C  03/20/2020, 8:07 AM

## 2020-03-20 NOTE — Progress Notes (Signed)
[facility] [place] NAME: Raymond Barnett    MR#:  [id number]  DATE OF BIRTH:  1929-12-22  SUBJECTIVE:  patient came in from [facility] with increasing shortness of breath and hypoxia. He was discharged on 16 November after hip fracture surgery.  Some intermittent confusion-- slept few hours per pt Cannot get comfortable in the bed Had BM today after enema On HFNC 70/40 sats 88-93% (increased fio2)  dter Joann in the room REVIEW OF SYSTEMS:   Review of Systems  Constitutional: Positive for malaise/fatigue. Negative for chills, fever and weight loss.  HENT: Negative for ear discharge, ear pain and nosebleeds.   Eyes: Negative for blurred vision, pain and discharge.  Respiratory: Positive for shortness of breath. Negative for sputum production, wheezing and stridor.   Cardiovascular: Negative for chest pain, palpitations, orthopnea and PND.  Gastrointestinal: Positive for nausea. Negative for abdominal pain, diarrhea and vomiting.  Genitourinary: Negative for frequency and urgency.  Musculoskeletal: Negative for back pain and joint pain.  Neurological: Positive for weakness. Negative for sensory change, speech change and focal weakness.  Psychiatric/Behavioral: Negative for depression and hallucinations. The patient is not nervous/anxious.    Tolerating Diet:yes Tolerating PT: SNF  DRUG ALLERGIES:  No Known Allergies  VITALS:  Blood pressure 105/64, pulse 95, temperature 97.6 F (36.4 C), temperature source Axillary, resp. rate (!) 23, height 5\' 10"  (1.778 m), weight 93 kg, SpO2 93 %.  PHYSICAL EXAMINATION:   Physical Exam  GENERAL:  86 y.o.-year-old patient lying in the bed with mild acute resp  distress. Appears chronically ill, deconditioned HEENT: Head atraumatic, normocephalic. Oropharynx and nasopharynx clear.  HFNC +  LUNGS: decreased breath sounds bilaterally, no wheezing, bibasilar rales, no [email or web address] intermittent  resp distress CARDIOVASCULAR: S1, S2 normal. No murmurs, rubs, or gallops. tachycardia ABDOMEN: Soft, nontender, nondistended. Bowel sounds present. No organomegaly or mass.  EXTREMITIES: No cyanosis, clubbing or edema b/l.    NEUROLOGIC: Cranial nerves II through XII are intact. No focal Motor or sensory deficits b/l.  weak PSYCHIATRIC:  patient is alert and oriented x 2 SKIN: No obvious rash, lesion, or ulcer.     LABORATORY PANEL:  CBC Recent Labs  Lab 03/20/20 0531  WBC 12.9*  HGB 8.9*  8.9*  HCT 29.1*  PLT 209    Chemistries  Recent Labs  Lab 03/15/2020 0920 03/19/20 0512 03/19/20 0512 03/20/20 0531 03/20/20 0716  NA  --  134*  --   --   --   K  --  3.1*   < > 3.7  --   CL  --  93*  --   --   --   CO2  --  28  --   --   --   GLUCOSE  --  128*  --   --   --   BUN  --  45*  --   --   --   CREATININE  --  1.17  --   --   --   CALCIUM  --  8.6*  --   --   --   MG  --   --   --  2.0  --   AST   < >  --   --   --  48*  ALT   < >  --   --   --  34  ALKPHOS   < >  --   --   --  93  BILITOT   < >  --   --   --  2.0*   < > = values in this interval not displayed.   Cardiac Enzymes No results for input(s): TROPONINI in the last 168 hours. RADIOLOGY:  DG Chest Port 1 View  Result Date: 03/20/2020 CLINICAL DATA:  Respiratory distress. EXAM: PORTABLE CHEST 1 VIEW COMPARISON:  03/21/2020 and chest CT 03/04/2020 FINDINGS: A single view of the chest demonstrates patchy bilateral lung densities. Evidence for bilateral pleural effusions. Airspace densities in the right mid lung has slightly worsened but slightly decreased in the left mid lung. Heart size is slightly prominent but accentuated by technique. Heart size is grossly stable. Negative for pneumothorax. IMPRESSION: 1. Persistent bilateral lung densities that have slightly changed as described. Findings are suggestive for asymmetric pulmonary edema. Infection cannot be excluded. 2. Bilateral pleural effusions. Electronically  Signed   By: Markus Daft M.D.   On: 03/20/2020 10:40   ASSESSMENT AND PLAN:  Raymond Barnett is a 86 y.o. male with medical history significant for chronic diastolic dysfunction CHF, COPD, status post recent right total hip arthroplasty and peripheral arterial disease who presents from the skilled nursing facility for evaluation of shortness of breath.  Shortness of breath is worse with any form of exertion and is associated with a cough productive of clear phlegm.  He has no lower extremity swelling. He denies having any orthopnea.  Patient states that his movement has been limited due to shortness of breath as well as pain in his hip.  Acute on chronic hypoxic respiratory failure secondary to CHF acute on chronic diastolic congestive heart failure -- patient came in from [facility] with sats 82% on room air. He was placed on not rebreather -- currently on heated high flow nasal cannula Fio2 70%  -- IV Lasix 40 mg BID- urine output 7.9 L -- appreciate [facility] cardiology input -- elevated BNP --  daily weight, input output -- repeat Echo shows EF 50-55% --BP soft --Hold Ace inhibitor (lisinopril was held at d/c on nov 16th due to low BP) -- pulmonary consultation. Case discussed with Dr.Aleskerov. -- Patient to get thoracentesis by IR at bedside.  Mild leukocytosis with questionable pneumonia on x-ray --clinically does not appear pneumonia -MRSA PCR negative-- DC vancomycin -- Pro calcitonin normal -pt remains afebrile and white count stable will discontinue antibiotics today -- patient does not have fever, no productive yellow cough. -11/29-- chest x-ray not much improvement. Discussed with pulmonary. Empiric antibiotic  Atrial fibrillation with RVR new onset in the setting of CHF -- was on IV diltiazem drip per cardiology--d/ced -- continue heparin drip given elevated CHADSVAC2 score --11/27--Per Dr Rockey Situ wean cardizem down and start IV amiodarone Bolus + gtt to see if pt converts to  SR --11/28--cont amiodarone gtt and heparin gtt --11/29-- change to oral amiodarone and continue IV heparin drip. Possible plans for right heart catheterization if oxygen requirement goes down  Recent right hip fracture status post surgery -- patient went to [facility] November 16 --Pt recs rehab  COPD chronic -PRN nebs  BPH continue Proscar  History of peripheral arterial disease continue aspirin  Glaucoma continue Timolol eye drops  Procedures:none Family communication :dter Joann at bedside 11/29 Consults :cardiology [facility] CODE STATUS: DNR prior to arrival DVT Prophylaxis : heparin drip  Status is: Inpatient  Remains inpatient appropriate because:IV treatments appropriate due to intensity of illness or inability to take PO   Dispo: The patient is from: SNF  Anticipated d/c is to: SNF              Anticipated d/c date is: 3 days              Patient currently is not medically stable to d/c.  Patient admitted with acute on chronic hypoxic respiratory failure currently requiring heated high flow nasal cannula. Getting aggressive IV Lasix for CHF treatment. IV amiodarone gtt and heparin gtt patient to get thoracentesis at bedside. His Fio2% to 70 requirement has gone up.  TOC for d/c planning--per dter pt does not want to return to [facility] THIS PATIENT: 25 minutes.  >50% time spent on counselling and coordination of care  Note: This dictation was prepared with Dragon dictation along with smaller phrase technology. Any transcriptional errors that result from this process are unintentional.  Fritzi Mandes M.D    [facility]   CC: Primary care physician; [name], DOPatient ID: [name], male   DOB: 1929/10/04, 86 y.o.   MRN: [id number]

## 2020-03-20 NOTE — Plan of Care (Signed)
Pt remains on HFNC at 40L, 70% fiO2. Attempt to wean but pt not tolerating lower fiO2 at this time. Thoracentesis today, 46mL off. Multiple tests sent out including sputum, urine, and fluid from procedure as part of workup to find source of infection. Antibiotics restarted today. Continuing to diurese with lasix. Pt sat at edge of bed and stood with walker with PT this afternoon. Appetite poor, however is willing to drink Ensure and eat ice cream. Daughter visited for several hours and was updated.   Problem: Education: Goal: Knowledge of General Education information will improve Description: Including pain rating scale, medication(s)/side effects and non-pharmacologic comfort measures Outcome: Progressing   Problem: Clinical Measurements: Goal: Ability to maintain clinical measurements within normal limits will improve Outcome: Progressing Goal: Cardiovascular complication will be avoided Outcome: Progressing   Problem: Elimination: Goal: Will not experience complications related to bowel motility Outcome: Progressing   Problem: Safety: Goal: Ability to remain free from injury will improve Outcome: Progressing   Problem: Skin Integrity: Goal: Risk for impaired skin integrity will decrease Outcome: Progressing   Problem: Clinical Measurements: Goal: Ability to maintain a body temperature in the normal range will improve Outcome: Progressing   Problem: Respiratory: Goal: Ability to maintain adequate ventilation will improve Outcome: Progressing Goal: Ability to maintain a clear airway will improve Outcome: Progressing

## 2020-03-20 NOTE — Progress Notes (Signed)
PHARMACY CONSULT NOTE  Pharmacy Consult for Electrolyte Monitoring and Replacement   Recent Labs: Potassium (mmol/L)  Date Value  03/20/2020 3.7   Calcium (mg/dL)  Date Value  03/19/2020 8.6 (L)   Albumin (g/dL)  Date Value  03/10/2020 3.1 (L)  02/16/2015 3.8   Phosphorus (mg/dL)  Date Value  11/25/2019 3.3   Sodium (mmol/L)  Date Value  03/19/2020 134 (L)  12/29/2019 136   Add-On magnesium: 2.0 mg/dL  Assessment: 86 year old male with PMHx of arthritis, lumbar degenerative disc disease, glaucoma, HTN, COPD, HFpEF, moderate mitral regurgitation, PAD, recent admission 11/11-11/16 for right fracture of femoral neck s/p right arthroplasty on 11/12 admitted with acute on chronic hypoxic respiratory failure secondary to CHF exacerbation, A-fib with RVR  Diuretics: furosemide 60 mg IV q 12h (increased today)  Goal of Therapy:  Potassium 4.0 - 5.1 mmol/L Magnesium 2.0 - 2.4 mg/dL All Other Electrolytes WNL  Plan:   Oral KCl 40 mEq x 2  Re-check electrolytes in am and replace as needed  Dallie Piles ,PharmD Clinical Pharmacist 03/20/2020 7:13 AM

## 2020-03-20 NOTE — Progress Notes (Signed)
Physical Therapy Treatment Patient Details Name: Raymond Barnett MRN: [id number] DOB: [birth year] Today's Date: 03/20/2020    History of Present Illness Pt admitted 2/2 SOB and hypoxia from [facility]. Found to hace CHF with acute hypoxic respiratory failure. Pt was recently d/c on 03/07/20 following operative repair of R hip fx (s/p R hip hemiarthroplasty). He has PMHx significant for HTN, DDD, and arthritis.    PT Comments    Pt was pleasant but required encouragement to participate during the session.  Pt's SpO2 on 40L remained in the low to mid 90s during supine therex and in the low 90s during seated therex.  Pt was able to come to standing after two attempts and while in standing was able to take several very small shuffle steps towards the head of bed but with SpO2 dropping to 78-79%.  Pt returned to supine with nurse present during the session and aware of pt response to activity.  Pt's SpO2 increased back to the mid 80s after around 2 minutes in supine with nurse remaining in the room working with patient at end of session.  Pt will benefit from PT services in a SNF setting upon discharge to safely address deficits listed in patient problem list for decreased caregiver assistance and eventual return to PLOF.     Follow Up Recommendations  SNF;Supervision for mobility/OOB     Equipment Recommendations  Other (comment) (TBD at next venue of care)    Recommendations for Other Services       Precautions / Restrictions Precautions Precautions: Fall;Anterior Hip Precaution Comments: anterior hip (no precautions) Restrictions Weight Bearing Restrictions: Yes RLE Weight Bearing: Weight bearing as tolerated Other Position/Activity Restrictions: Anterior R hip hemiarthroplasty 03/03/20 WBAT per prior admission notes    Mobility  Bed Mobility Overal bed mobility: Needs Assistance Bed Mobility: Supine to Sit;Sit to Supine     Supine to sit: Mod assist Sit to supine: Mod  assist   General bed mobility comments: Mod A for BLE and trunk control  Transfers Overall transfer level: Needs assistance Equipment used: Rolling walker (2 wheeled) Transfers: Sit to/from Stand   Stand pivot transfers: From elevated surface;Min assist       General transfer comment: Pt required two attempts to come to standing with flexed trunk posture once up and unable to come to full upright standing  Ambulation/Gait Ambulation/Gait assistance: Min assist;+2 safety/equipment Gait Distance (Feet): 1 Feet Assistive device: Rolling walker (2 wheeled) Gait Pattern/deviations: Decreased step length - right;Decreased step length - left;Step-to pattern;Trunk flexed;Shuffle Gait velocity: decreased   General Gait Details: Pt able to take several very small, shuffling side steps at the EOB with min A to advance the RW   Stairs             Wheelchair Mobility    Modified Rankin (Stroke Patients Only)       Balance Overall balance assessment: Needs assistance Sitting-balance support: Bilateral upper extremity supported Sitting balance-Leahy Scale: Good     Standing balance support: Bilateral upper extremity supported;During functional activity Standing balance-Leahy Scale: Fair Standing balance comment: Heavy lean on the RW for support                            Cognition Arousal/Alertness: Awake/alert Behavior During Therapy: WFL for tasks assessed/performed Overall Cognitive Status: Within Functional Limits for tasks assessed  Exercises Total Joint Exercises Ankle Circles/Pumps: AROM;Strengthening;Left;10 reps Quad Sets: Strengthening;Both;10 reps Gluteal Sets: Strengthening;Both;10 reps Heel Slides: AAROM;Strengthening;Both;5 reps Hip ABduction/ADduction: AAROM;Strengthening;Both;10 reps Straight Leg Raises: AAROM;Strengthening;Both;10 reps Long Arc Quad: AROM;Strengthening;Both;10  reps Other Exercises Other Exercises: Static sitting at EOB x 5 min for core strengthening and improved activity tolerance    General Comments        Pertinent Vitals/Pain Pain Assessment: No/denies pain    Home Living                      Prior Function            PT Goals (current goals can now be found in the care plan section) Progress towards PT goals: Progressing toward goals    Frequency    Min 2X/week      PT Plan Current plan remains appropriate    Co-evaluation              AM-PAC PT "6 Clicks" Mobility   Outcome Measure  Help needed turning from your back to your side while in a flat bed without using bedrails?: A Lot Help needed moving from lying on your back to sitting on the side of a flat bed without using bedrails?: A Lot Help needed moving to and from a bed to a chair (including a wheelchair)?: Total Help needed standing up from a chair using your arms (e.g., wheelchair or bedside chair)?: A Lot Help needed to walk in hospital room?: Total Help needed climbing 3-5 steps with a railing? : Total 6 Click Score: 9    End of Session Equipment Utilized During Treatment: Oxygen;Gait belt Activity Tolerance: Treatment limited secondary to medical complications (Comment) (SpO2 down to 78-79% after limited amb) Patient left: in bed;with nursing/sitter in room;with call bell/phone within reach Nurse Communication: Mobility status;Other (comment) (Nurse present during session and aware of pt SpO2 response to activity) PT Visit Diagnosis: Other abnormalities of gait and mobility (R26.89);Muscle weakness (generalized) (M62.81)     Time: 2060-1561 PT Time Calculation (min) (ACUTE ONLY): 27 min  Charges:  $Therapeutic Exercise: 8-22 mins $Therapeutic Activity: 8-22 mins                     D. Royetta Asal PT, DPT 03/20/20, 5:37 PM

## 2020-03-20 NOTE — Procedures (Signed)
Ultrasound-guided diagnostic and therapeutic right sided thoracentesis performed yielding 450 liters of straw colored fluid. No immediate complications.   Diagnostic fluid was sent to the lab for further analysis. Follow-up chest x-ray pending. EBL is < 2 ml.

## 2020-03-20 NOTE — Progress Notes (Signed)
Initial Nutrition Assessment  DOCUMENTATION CODES:   Not applicable  INTERVENTION:  Provide Ensure Enlive po TID, each supplement provides 350 kcal and 20 grams of protein. Patient prefers chocolate.  Provide Magic cup TID with meals, each supplement provides 290 kcal and 9 grams of protein. Patient prefers chocolate.  Provide MVI po daily.  If patient's PO intake does not improve will need to consider placement of small-bore feeding tube for initiation of tube feeds if this aligns with goals of care.  NUTRITION DIAGNOSIS:   Inadequate oral intake related to decreased appetite as evidenced by per patient/family report.  GOAL:   Patient will meet greater than or equal to 90% of their needs  MONITOR:   PO intake, Supplement acceptance, Labs, Weight trends, I & O's, Skin  REASON FOR ASSESSMENT:   Consult Poor PO  ASSESSMENT:   86 year old male with PMHx of arthritis, lumbar degenerative disc disease, glaucoma, HTN, COPD, HFpEF, moderate mitral regurgitation, PAD, recent admission 11/11-11/16 for right fracture of femoral neck s/p right arthroplasty on 11/12 admitted with acute on chronic hypoxic respiratory failure secondary to CHF exacerbation, A-fib with RVR.   Met with patient at bedside. He reports his appetite has been decreased since his surgery but worsened over the last few days. He had previously reported that appetite was decreased for 3 months before recent admission. This admission patient only documented to be eating 0-40% of his meals. He is unable to provide details on intake at [facility]. He is amenable to drinking oral nutrition supplements to help meet calorie/protein needs.   Patient reports his UBW was 220 lbs and he has lost weight over the last 3 months. Patient has been fairly weight stable for >1 year. He is currently documented to be 93 kg (205 lbs).  Medications reviewed and include: azithromycin, Oscal with D 1 tablet daily, vitamin D 1000 units  daily, Colace 100 mg BID, asix 60 mg Q12hrs IV, Protonix, Miralax, ceftriaxone, heparin infusion.  Labs reviewed: On 11/28 Sodium 134, Potassium 3.1, Chloride 93, BUN 45.  Discussed with RN.  NUTRITION - FOCUSED PHYSICAL EXAM:    Most Recent Value  Orbital Region No depletion  Upper Arm Region Mild depletion  Thoracic and Lumbar Region No depletion  Buccal Region No depletion  Temple Region No depletion  Clavicle Bone Region Mild depletion  Clavicle and Acromion Bone Region No depletion  Scapular Bone Region No depletion  Dorsal Hand No depletion  Patellar Region No depletion  Anterior Thigh Region No depletion  Posterior Calf Region Moderate depletion  Edema (RD Assessment) Mild  Hair Reviewed  Eyes Reviewed  Mouth Reviewed  Skin Reviewed  Nails Reviewed     Diet Order:   Diet Order            Diet Heart Room service appropriate? Yes; Fluid consistency: Thin  Diet effective now                EDUCATION NEEDS:   No education needs have been identified at this time  Skin:  Skin Assessment: Skin Integrity Issues: Skin Integrity Issues:: Incisions, Other (Comment) Incisions: closed incision right hip Other: wound right ankle (2cm x 2cm)  Last BM:  03/19/2020 per chart  Height:   Ht Readings from Last 1 Encounters:  02/26/2020 '5\' 10"'  (1.778 m)   Weight:   Wt Readings from Last 1 Encounters:  03/10/2020 93 kg   BMI:  Body mass index is 29.41 kg/m.  Estimated Nutritional Needs:  Kcal:  2200-2400  Protein:  110-120 grams  Fluid:  2 L/day  Jacklynn Barnacle, MS, RD, LDN Pager number available on Amion

## 2020-03-20 NOTE — Consult Note (Signed)
ANTICOAGULATION CONSULT NOTE  Pharmacy Consult for Heparin gtt Indication: atrial fibrillation  Patient Measurements: Heparin Dosing Weight: 91.8kg  Labs: Recent Labs    03/18/20 0205 03/18/20 0205 03/19/20 0512 03/20/20 0531  HGB 9.9*   < > 9.6* 8.9*  HCT 30.7*  --  29.0*  --   PLT 247  --  225  --   HEPARINUNFRC 0.63  --  0.69 0.76*  CREATININE 1.12  --  1.17  --    < > = values in this interval not displayed.    Estimated Creatinine Clearance: 50 mL/min (by C-G formula based on SCr of 1.17 mg/dL).   Medical History: Past Medical History:  Diagnosis Date  . (HFpEF) heart failure with improved ejection fraction ([place])    a. 08/2017 Echo: EF 45-50%; b. 10/2019 Echo: EF 55-60%. No rwma, mild LVH, Gr2 DD, RVSP 47.53mmHg. Mod dil LA. Mod MR. Asc Ao 43mm.  . Arthritis   . Benign prostate hyperplasia    lower urinary tract symptoms  . Cataracts, bilateral   . COPD (chronic obstructive pulmonary disease) ([place])   . DDD (degenerative disc disease), lumbar   . Glaucoma   . H/O carpal tunnel syndrome   . History of osteomyelitis 1953   right leg  . Hypertension   . Moderate mitral regurgitation    a. 10/2019 Echo: Mod MR.  Marland Kitchen PAD (peripheral artery disease) ([place])    a. 08/2017 LE ABI & Duplex: ABI R 0.60, L 1.07. 30-49% R popliteal and 50-74% R PT stenoses.  . Thrombocytopenia ([place])     Assessment: 86yo M with h/o HFpEF, COPD, PAD, & HTN who presents due to a 2-day history of worsening shortness of breath and cough.  Recent admit to hospital for hip surgery. Currently indicated for anticoagulation for atrial fibrillation and profile reviewed for medication interactions. H&H currently trending down, platelets WNL  Goal of Therapy:  Heparin level 0.3-0.7 units/ml Monitor platelets by anticoagulation protocol: Yes   Plan:   Anti-Xa levels remains SUPRAtherapeutic despite rate reduction  Reduce heparin infusion to 1050 units/hr  Re-check anti-Xa level 6 hours after rate  change --Daily CBC per protocol  Dallie Piles 03/20/2020,7:19 AM

## 2020-03-20 NOTE — Consult Note (Signed)
ANTICOAGULATION CONSULT NOTE  Pharmacy Consult for Heparin gtt Indication: atrial fibrillation  Patient Measurements: Heparin Dosing Weight: 91.8kg  Labs: Recent Labs    03/17/20 0624 03/17/20 1743 03/18/20 0205 03/18/20 0205 03/19/20 0512 03/20/20 0531  HGB 10.4*  --  9.9*   < > 9.6* 8.9*  HCT 33.1*  --  30.7*  --  29.0*  --   PLT 239  --  247  --  225  --   LABPROT 14.5  --   --   --   --   --   INR 1.2  --   --   --   --   --   HEPARINUNFRC  --    < > 0.63  --  0.69 0.76*  CREATININE 0.88  --  1.12  --  1.17  --    < > = values in this interval not displayed.    Estimated Creatinine Clearance: 50 mL/min (by C-G formula based on SCr of 1.17 mg/dL).   Medical History: Past Medical History:  Diagnosis Date  . (HFpEF) heart failure with improved ejection fraction ([place])    a. 08/2017 Echo: EF 45-50%; b. 10/2019 Echo: EF 55-60%. No rwma, mild LVH, Gr2 DD, RVSP 47.26mmHg. Mod dil LA. Mod MR. Asc Ao 35mm.  . Arthritis   . Benign prostate hyperplasia    lower urinary tract symptoms  . Cataracts, bilateral   . COPD (chronic obstructive pulmonary disease) ([place])   . DDD (degenerative disc disease), lumbar   . Glaucoma   . H/O carpal tunnel syndrome   . History of osteomyelitis 1953   right leg  . Hypertension   . Moderate mitral regurgitation    a. 10/2019 Echo: Mod MR.  Marland Kitchen PAD (peripheral artery disease) ([place])    a. 08/2017 LE ABI & Duplex: ABI R 0.60, L 1.07. 30-49% R popliteal and 50-74% R PT stenoses.  . Thrombocytopenia ([facility])     Medications:  ASA 81mg  QD  Assessment: 86yo M with h/o HFpEF, COPD, PAD, & HTN who presents due to a 2-day history of worsening shortness of breath and cough.  Recent admit to hospital for hip surgery. Currently indicated for anticoagulation for atrial fibrillation and profile reviewed for medication interactions. INR 1.2; H/H 10.4/33.1; Plts 275>239 at admit. Trop 294.  Goal of Therapy:  Heparin level 0.3-0.7 units/ml Monitor platelets by  anticoagulation protocol: Yes   Plan:  --11/29 at 0531 HL = 0.76, SUPRAtherapeutic. Will REDUCE heparin infusion to 1200 units/hr --Re-check HL 6 hours after rate decrease --Daily CBC per protocol  Hart Robinsons A 03/20/2020,6:18 AM

## 2020-03-21 ENCOUNTER — Inpatient Hospital Stay: Payer: Medicare Other

## 2020-03-21 ENCOUNTER — Inpatient Hospital Stay: Payer: Medicare Other | Admitting: Internal Medicine

## 2020-03-21 DIAGNOSIS — I4891 Unspecified atrial fibrillation: Secondary | ICD-10-CM | POA: Diagnosis not present

## 2020-03-21 DIAGNOSIS — I4819 Other persistent atrial fibrillation: Secondary | ICD-10-CM

## 2020-03-21 DIAGNOSIS — J9601 Acute respiratory failure with hypoxia: Secondary | ICD-10-CM | POA: Diagnosis not present

## 2020-03-21 DIAGNOSIS — J90 Pleural effusion, not elsewhere classified: Secondary | ICD-10-CM

## 2020-03-21 DIAGNOSIS — I5031 Acute diastolic (congestive) heart failure: Secondary | ICD-10-CM | POA: Diagnosis not present

## 2020-03-21 DIAGNOSIS — I10 Essential (primary) hypertension: Secondary | ICD-10-CM | POA: Diagnosis not present

## 2020-03-21 LAB — RENAL FUNCTION PANEL
Albumin: 2.8 g/dL — ABNORMAL LOW (ref 3.5–5.0)
Anion gap: 12 (ref 5–15)
BUN: 60 mg/dL — ABNORMAL HIGH (ref 8–23)
CO2: 29 mmol/L (ref 22–32)
Calcium: 8.6 mg/dL — ABNORMAL LOW (ref 8.9–10.3)
Chloride: 93 mmol/L — ABNORMAL LOW (ref 98–111)
Creatinine, Ser: 1.7 mg/dL — ABNORMAL HIGH (ref 0.61–1.24)
GFR, Estimated: 38 mL/min — ABNORMAL LOW (ref >60)
Glucose, Bld: 121 mg/dL — ABNORMAL HIGH (ref 70–99)
Phosphorus: 2.7 mg/dL (ref 2.5–4.6)
Potassium: 4.1 mmol/L (ref 3.5–5.1)
Sodium: 134 mmol/L — ABNORMAL LOW (ref 135–145)

## 2020-03-21 LAB — GLUCOSE, PLEURAL OR PERITONEAL FLUID: Glucose, Fluid: 142 mg/dL

## 2020-03-21 LAB — CULTURE, BLOOD (ROUTINE X 2)
Culture: NO GROWTH
Culture: NO GROWTH
Special Requests: ADEQUATE

## 2020-03-21 LAB — CBC
HCT: 30 % — ABNORMAL LOW (ref 39.0–52.0)
Hemoglobin: 9.4 g/dL — ABNORMAL LOW (ref 13.0–17.0)
MCH: 31.3 pg (ref 26.0–34.0)
MCHC: 31.3 g/dL (ref 30.0–36.0)
MCV: 100 fL (ref 80.0–100.0)
Platelets: 188 10*3/uL (ref 150–400)
RBC: 3 MIL/uL — ABNORMAL LOW (ref 4.22–5.81)
RDW: 23.7 % — ABNORMAL HIGH (ref 11.5–15.5)
WBC: 17.4 10*3/uL — ABNORMAL HIGH (ref 4.0–10.5)
nRBC: 1.7 % — ABNORMAL HIGH (ref 0.0–0.2)

## 2020-03-21 LAB — BODY FLUID CELL COUNT WITH DIFFERENTIAL
Eos, Fluid: 0 %
Lymphs, Fluid: 58 %
Monocyte-Macrophage-Serous Fluid: 4 %
Neutrophil Count, Fluid: 38 %
Total Nucleated Cell Count, Fluid: 452 cu mm

## 2020-03-21 LAB — MAGNESIUM: Magnesium: 2 mg/dL (ref 1.7–2.4)

## 2020-03-21 LAB — LACTATE DEHYDROGENASE, PLEURAL OR PERITONEAL FLUID: LD, Fluid: 163 U/L — ABNORMAL HIGH (ref 3–23)

## 2020-03-21 LAB — HEPARIN LEVEL (UNFRACTIONATED)
Heparin Unfractionated: 0.53 IU/mL (ref 0.30–0.70)
Heparin Unfractionated: 0.62 IU/mL (ref 0.30–0.70)

## 2020-03-21 LAB — PATHOLOGIST SMEAR REVIEW

## 2020-03-21 MED ORDER — APIXABAN 2.5 MG PO TABS
2.5000 mg | ORAL_TABLET | Freq: Two times a day (BID) | ORAL | Status: DC
  Administered 2020-03-21 – 2020-03-22 (×4): 2.5 mg via ORAL
  Filled 2020-03-21 (×4): qty 1

## 2020-03-21 NOTE — Consult Note (Signed)
ANTICOAGULATION CONSULT NOTE  Pharmacy Consult for Heparin gtt Indication: atrial fibrillation  Patient Measurements: Heparin Dosing Weight: 91.8kg  Labs: Recent Labs    03/18/20 0205 03/18/20 0205 03/19/20 0512 03/19/20 0512 03/20/20 0531 03/20/20 1348 03/20/20 2347  HGB 9.9*   < > 9.6*  --  8.9*  8.9*  --   --   HCT 30.7*  --  29.0*  --  29.1*  --   --   PLT 247  --  225  --  209  --   --   LABPROT  --   --   --   --   --  15.1  --   INR  --   --   --   --   --  1.2  --   HEPARINUNFRC 0.63   < > 0.69   < > 0.76* 0.74* 0.53  CREATININE 1.12  --  1.17  --   --   --   --    < > = values in this interval not displayed.    Estimated Creatinine Clearance: 50 mL/min (by C-G formula based on SCr of 1.17 mg/dL).   Medical History: Past Medical History:  Diagnosis Date  . (HFpEF) heart failure with improved ejection fraction ([place])    a. 08/2017 Echo: EF 45-50%; b. 10/2019 Echo: EF 55-60%. No rwma, mild LVH, Gr2 DD, RVSP 47.65mmHg. Mod dil LA. Mod MR. Asc Ao 22mm.  . Arthritis   . Benign prostate hyperplasia    lower urinary tract symptoms  . Cataracts, bilateral   . COPD (chronic obstructive pulmonary disease) ([place])   . DDD (degenerative disc disease), lumbar   . Glaucoma   . H/O carpal tunnel syndrome   . History of osteomyelitis 1953   right leg  . Hypertension   . Moderate mitral regurgitation    a. 10/2019 Echo: Mod MR.  Marland Kitchen PAD (peripheral artery disease) ([place])    a. 08/2017 LE ABI & Duplex: ABI R 0.60, L 1.07. 30-49% R popliteal and 50-74% R PT stenoses.  . Thrombocytopenia ([place])     Assessment: 86yo M with h/o HFpEF, COPD, PAD, & HTN who presents due to a 2-day history of worsening shortness of breath and cough.  Recent admit to hospital for hip surgery. Currently indicated for anticoagulation for atrial fibrillation and profile reviewed for medication interactions. H&H currently trending down, platelets WNL  Goal of Therapy:  Heparin level 0.3-0.7 units/ml Monitor  platelets by anticoagulation protocol: Yes   Plan:  11/29:  HL @ 2347 = 0.53 Will recheck HL on 11/30 @ 0800.  Darlin Stenseth D 03/21/2020,1:49 AM

## 2020-03-21 NOTE — Progress Notes (Signed)
PHARMACY CONSULT NOTE  Pharmacy Consult for Electrolyte Monitoring and Replacement   Recent Labs: Potassium (mmol/L)  Date Value  03/21/2020 4.1   Magnesium (mg/dL)  Date Value  03/21/2020 2.0   Calcium (mg/dL)  Date Value  03/21/2020 8.6 (L)   Albumin (g/dL)  Date Value  03/21/2020 2.8 (L)  02/16/2015 3.8   Phosphorus (mg/dL)  Date Value  03/21/2020 2.7   Sodium (mmol/L)  Date Value  03/21/2020 134 (L)  12/29/2019 136    Assessment: 86 year old male with PMHx of arthritis, lumbar degenerative disc disease, glaucoma, HTN, COPD, HFpEF, moderate mitral regurgitation, PAD, recent admission 11/11-11/16 for right fracture of femoral neck s/p right arthroplasty on 11/12 admitted with acute on chronic hypoxic respiratory failure secondary to CHF exacerbation, A-fib with RVR  Diuretics: furosemide 60 mg IV q12h  Goal of Therapy:  Potassium 4.0 - 5.1 mmol/L Magnesium 2.0 - 2.4 mg/dL All Other Electrolytes WNL  Plan:   Renal function worse today. No electrolyte replacement indicated  Re-check electrolytes in AM and replace as needed  Benita Gutter 03/21/2020 8:41 AM

## 2020-03-21 NOTE — Consult Note (Signed)
ANTICOAGULATION CONSULT NOTE  Pharmacy Consult for Heparin gtt Indication: atrial fibrillation  Patient Measurements: Heparin Dosing Weight: 91.8kg  Labs: Recent Labs    03/19/20 0512 03/19/20 0512 03/20/20 0531 03/20/20 0531 03/20/20 1348 03/20/20 2347 03/21/20 0533 03/21/20 0808  HGB 9.6*   < > 8.9*  8.9*  --   --   --  9.4*  --   HCT 29.0*  --  29.1*  --   --   --  30.0*  --   PLT 225  --  209  --   --   --  188  --   LABPROT  --   --   --   --  15.1  --   --   --   INR  --   --   --   --  1.2  --   --   --   HEPARINUNFRC 0.69   < > 0.76*   < > 0.74* 0.53  --  0.62  CREATININE 1.17  --   --   --   --   --  1.70*  --    < > = values in this interval not displayed.    Estimated Creatinine Clearance: 34.4 mL/min (A) (by C-G formula based on SCr of 1.7 mg/dL (H)).   Medical History: Past Medical History:  Diagnosis Date  . (HFpEF) heart failure with improved ejection fraction ([place])    a. 08/2017 Echo: EF 45-50%; b. 10/2019 Echo: EF 55-60%. No rwma, mild LVH, Gr2 DD, RVSP 47.58mmHg. Mod dil LA. Mod MR. Asc Ao 109mm.  . Arthritis   . Benign prostate hyperplasia    lower urinary tract symptoms  . Cataracts, bilateral   . COPD (chronic obstructive pulmonary disease) ([place])   . DDD (degenerative disc disease), lumbar   . Glaucoma   . H/O carpal tunnel syndrome   . History of osteomyelitis 1953   right leg  . Hypertension   . Moderate mitral regurgitation    a. 10/2019 Echo: Mod MR.  Marland Kitchen PAD (peripheral artery disease) ([place])    a. 08/2017 LE ABI & Duplex: ABI R 0.60, L 1.07. 30-49% R popliteal and 50-74% R PT stenoses.  . Thrombocytopenia ([place])     Assessment: 86yo M with h/o HFpEF, COPD, PAD, & HTN who presents due to a 2-day history of worsening shortness of breath and cough.  Recent admit to hospital for hip surgery. Currently indicated for anticoagulation for atrial fibrillation and profile reviewed for medication interactions. H&H currently trending down, platelets  WNL  Goal of Therapy:  Heparin level 0.3-0.7 units/ml Monitor platelets by anticoagulation protocol: Yes   Plan:  --11/30 at 0808 HL = 0.62, therapeutic x 2. Maintain heparin infusion at 1050 units/hr --Re-check HL 12/1 AM --Daily CBC per protocol while on heparin infusion  Benita Gutter 03/21/2020,8:38 AM

## 2020-03-21 NOTE — Progress Notes (Deleted)
[facility] OFFICE PROGRESS NOTE  Patient Care Team: [name], DO as PCP - General (Family Medicine) End, [name], MD as PCP - Cardiology (Cardiology) Casimer Lanius, DPM (Podiatry) Felipa Eth, MD (Internal Medicine)   SUMMARY OF ONCOLOGIC HISTORY:  # 2006 CHRONIC LOW THROMBOCYTOPENIA- ITP [clinically more likely] vs MDS [ No BMBx]; [place] 2013- Normal spleen; asymptomatic on surveillance  INTERVAL HISTORY:  A very pleasant 86 year-old male patient who looks much younger than his stated age and is here for follow-up.  Patient denies any unusual cough or shortness of breath on exertion.   Chronic mild bruising not any worse.  Review of Systems  Constitutional: Negative for chills, diaphoresis, fever, malaise/fatigue and weight loss.  HENT: Negative for nosebleeds and sore throat.   Eyes: Negative for double vision.  Respiratory: Negative for cough, hemoptysis, sputum production, shortness of breath and wheezing.   Cardiovascular: Negative for chest pain, palpitations, orthopnea and leg swelling.  Gastrointestinal: Negative for abdominal pain, blood in stool, constipation, diarrhea, heartburn, melena, nausea and vomiting.  Genitourinary: Negative for dysuria, frequency and urgency.  Musculoskeletal: Negative for back pain and joint pain.  Skin: Negative.  Negative for itching and rash.  Neurological: Negative for dizziness, tingling, focal weakness, weakness and headaches.  Endo/Heme/Allergies: Bruises/bleeds easily.  Psychiatric/Behavioral: Negative for depression. The patient is not nervous/anxious and does not have insomnia.      PAST MEDICAL HISTORY :  Past Medical History:  Diagnosis Date  . (HFpEF) heart failure with improved ejection fraction ([place])    a. 08/2017 Echo: EF 45-50%; b. 10/2019 Echo: EF 55-60%. No rwma, mild LVH, Gr2 DD, RVSP 47.61mmHg. Mod dil LA. Mod MR. Asc Ao 57mm.  . Arthritis   . Benign prostate hyperplasia     lower urinary tract symptoms  . Cataracts, bilateral   . COPD (chronic obstructive pulmonary disease) ([place])   . DDD (degenerative disc disease), lumbar   . Glaucoma   . H/O carpal tunnel syndrome   . History of osteomyelitis 1953   right leg  . Hypertension   . Moderate mitral regurgitation    a. 10/2019 Echo: Mod MR.  Marland Kitchen PAD (peripheral artery disease) ([place])    a. 08/2017 LE ABI & Duplex: ABI R 0.60, L 1.07. 30-49% R popliteal and 50-74% R PT stenoses.  . Thrombocytopenia ([place])     PAST SURGICAL HISTORY :   Past Surgical History:  Procedure Laterality Date  . CARPAL TUNNEL RELEASE Left   . CARPAL TUNNEL RELEASE Right   . COLONOSCOPY  2015   Dr. Allen Norris  . HIP ARTHROPLASTY Right 03/03/2020   Procedure: ARTHROPLASTY BIPOLAR HIP (HEMIARTHROPLASTY);  Surgeon: Hessie Knows, MD;  Location: [facility] ORS;  Service: Orthopedics;  Laterality: Right;  . LEG SURGERY Right 1953  . SKIN GRAFT Right 05/01/2017   right foot [facility]  . TOTAL KNEE ARTHROPLASTY Right     FAMILY HISTORY :   Family History  Problem Relation Age of Onset  . Colon cancer Mother 56  . Thrombosis Father 36  . Heart disease Father        Heart valve problem  . AAA (abdominal aortic aneurysm) Father   . Hypertension Sister   . Hyperlipidemia Sister   . Hypertension Sister   . Hyperlipidemia Sister   . Hypertension Sister   . Hyperlipidemia Sister     SOCIAL HISTORY:   Social History   Tobacco Use  . Smoking status: Former Smoker    Packs/day: 1.00  Years: 30.00    Pack years: 30.00    Types: Cigarettes    Quit date: 87    Years since quitting: 26.9  . Smokeless tobacco: Never Used  Vaping Use  . Vaping Use: Never used  Substance Use Topics  . Alcohol use: Not Currently    Comment: occasionally wine   . Drug use: No    ALLERGIES:  has No Known Allergies.  MEDICATIONS:  No current facility-administered medications for this visit.   No current outpatient medications on file.    Facility-Administered Medications Ordered in Other Visits  Medication Dose Route Frequency Provider Last Rate Last Admin  . acetaminophen (TYLENOL) tablet 650 mg  650 mg Oral Q6H PRN Agbata, Tochukwu, MD   650 mg at 03/20/20 0044   Or  . acetaminophen (TYLENOL) suppository 650 mg  650 mg Rectal Q6H PRN Agbata, Tochukwu, MD      . alum & mag hydroxide-simeth (MAALOX/MYLANTA) 200-200-20 MG/5ML suspension 30 mL  30 mL Oral Q4H PRN Agbata, Tochukwu, MD      . amiodarone (PACERONE) tablet 400 mg  400 mg Oral BID Kathlyn Sacramento A, MD   400 mg at 03/20/20 2114  . azithromycin (ZITHROMAX) tablet 500 mg  500 mg Oral Daily Fritzi Mandes, MD   500 mg at 03/20/20 1504  . bisacodyl (DULCOLAX) suppository 10 mg  10 mg Rectal Daily PRN Agbata, Tochukwu, MD   10 mg at 03/17/20 1436  . calcium-vitamin D (OSCAL WITH D) 500-200 MG-UNIT per tablet 1 tablet  1 tablet Oral Daily Agbata, Tochukwu, MD   1 tablet at 03/20/20 0936  . cefTRIAXone (ROCEPHIN) 2 g in sodium chloride 0.9 % 100 mL IVPB  2 g Intravenous Q24H Fritzi Mandes, MD   Stopped at 03/20/20 1446  . Chlorhexidine Gluconate Cloth 2 % PADS 6 each  6 each Topical Daily Fritzi Mandes, MD   6 each at 03/20/20 1730  . cholecalciferol (VITAMIN D) tablet 1,000 Units  1,000 Units Oral Daily Agbata, Tochukwu, MD   1,000 Units at 03/20/20 0936  . docusate sodium (COLACE) capsule 100 mg  100 mg Oral BID Agbata, Tochukwu, MD   100 mg at 03/20/20 2115  . feeding supplement (ENSURE ENLIVE / ENSURE PLUS) liquid 237 mL  237 mL Oral TID BM Fritzi Mandes, MD   237 mL at 03/20/20 2013  . finasteride (PROSCAR) tablet 5 mg  5 mg Oral Daily Agbata, Tochukwu, MD   5 mg at 03/20/20 0936  . fluticasone (FLONASE) 50 MCG/ACT nasal spray 2 spray  2 spray Each Nare Daily PRN Agbata, Tochukwu, MD      . furosemide (LASIX) injection 60 mg  60 mg Intravenous Q12H Kathlyn Sacramento A, MD   60 mg at 03/21/20 0511  . heparin ADULT infusion 100 units/mL (25000 units/263mL sodium chloride 0.45%)   1,050 Units/hr Intravenous Continuous Dallie Piles, RPH 10.5 mL/hr at 03/21/20 0600 1,050 Units/hr at 03/21/20 0600  . ipratropium-albuterol (DUONEB) 0.5-2.5 (3) MG/3ML nebulizer solution 3 mL  3 mL Nebulization Q6H PRN Agbata, Tochukwu, MD      . methocarbamol (ROBAXIN) tablet 500 mg  500 mg Oral Q6H PRN Agbata, Tochukwu, MD   500 mg at 03/21/20 0314  . multivitamin with minerals tablet 1 tablet  1 tablet Oral Daily Fritzi Mandes, MD      . ondansetron [facility]) tablet 4 mg  4 mg Oral Q6H PRN Agbata, Tochukwu, MD       Or  . ondansetron (ZOFRAN) injection 4  mg  4 mg Intravenous Q6H PRN Agbata, Tochukwu, MD   4 mg at 03/20/20 0742  . pantoprazole (PROTONIX) EC tablet 40 mg  40 mg Oral Daily Agbata, Tochukwu, MD   40 mg at 03/20/20 0936  . polyethylene glycol (MIRALAX / GLYCOLAX) packet 17 g  17 g Oral QHS Agbata, Tochukwu, MD   17 g at 03/20/20 2115  . polyvinyl alcohol (LIQUIFILM TEARS) 1.4 % ophthalmic solution 1 drop  1 drop Both Eyes PRN Agbata, Tochukwu, MD      . timolol (TIMOPTIC) 0.5 % ophthalmic solution 1 drop  1 drop Both Eyes BID Agbata, Tochukwu, MD   1 drop at 03/20/20 2115  . traZODone (DESYREL) tablet 100 mg  100 mg Oral QHS Agbata, Tochukwu, MD   100 mg at 03/20/20 2115  . zolpidem (AMBIEN) tablet 5 mg  5 mg Oral QHS PRN Fritzi Mandes, MD        PHYSICAL EXAMINATION: ECOG PERFORMANCE STATUS: 0 - Asymptomatic  There were no vitals taken for this visit.  There were no vitals filed for this visit.  Physical Exam HENT:     Head: Normocephalic and atraumatic.     Mouth/Throat:     Pharynx: No oropharyngeal exudate.  Eyes:     Pupils: Pupils are equal, round, and reactive to light.  Cardiovascular:     Rate and Rhythm: Normal rate and regular rhythm.  Pulmonary:     Effort: Pulmonary effort is normal. No respiratory distress.     Breath sounds: Normal breath sounds. No wheezing.  Abdominal:     General: Bowel sounds are normal. There is no distension.     Palpations:  Abdomen is soft. There is no mass.     Tenderness: There is no abdominal tenderness. There is no guarding or rebound.  Musculoskeletal:        General: No tenderness. Normal range of motion.     Cervical back: Normal range of motion and neck supple.  Skin:    General: Skin is warm.  Neurological:     Mental Status: He is alert and oriented to person, place, and time.  Psychiatric:        Mood and Affect: Affect normal.     LABORATORY DATA:  I have reviewed the data as listed    Component Value Date/Time   NA 134 (L) 03/21/2020 0533   NA 136 12/29/2019 1210   K 4.1 03/21/2020 0533   CL 93 (L) 03/21/2020 0533   CO2 29 03/21/2020 0533   GLUCOSE 121 (H) 03/21/2020 0533   BUN 60 (H) 03/21/2020 0533   BUN 22 12/29/2019 1210   CREATININE 1.70 (H) 03/21/2020 0533   CREATININE 0.83 06/29/2019 0837   CALCIUM 8.6 (L) 03/21/2020 0533   PROT 5.7 (L) 03/20/2020 0716   PROT 6.3 02/16/2015 1001   ALBUMIN 2.8 (L) 03/21/2020 0533   ALBUMIN 3.8 02/16/2015 1001   AST 48 (H) 03/20/2020 0716   ALT 34 03/20/2020 0716   ALKPHOS 93 03/20/2020 0716   BILITOT 2.0 (H) 03/20/2020 0716   BILITOT 0.8 02/16/2015 1001   GFRNONAA 38 (L) 03/21/2020 0533   GFRNONAA 79 06/29/2019 0837   GFRAA 89 12/29/2019 1210   GFRAA 92 06/29/2019 0837    No results found for: SPEP, UPEP  Lab Results  Component Value Date   WBC 17.4 (H) 03/21/2020   NEUTROABS 13.6 (H) 03/15/2020   HGB 9.4 (L) 03/21/2020   HCT 30.0 (L) 03/21/2020   MCV 100.0 03/21/2020  PLT 188 03/21/2020      Chemistry      Component Value Date/Time   NA 134 (L) 03/21/2020 0533   NA 136 12/29/2019 1210   K 4.1 03/21/2020 0533   CL 93 (L) 03/21/2020 0533   CO2 29 03/21/2020 0533   BUN 60 (H) 03/21/2020 0533   BUN 22 12/29/2019 1210   CREATININE 1.70 (H) 03/21/2020 0533   CREATININE 0.83 06/29/2019 0837      Component Value Date/Time   CALCIUM 8.6 (L) 03/21/2020 0533   ALKPHOS 93 03/20/2020 0716   AST 48 (H) 03/20/2020 0716   ALT  34 03/20/2020 0716   BILITOT 2.0 (H) 03/20/2020 0716   BILITOT 0.8 02/16/2015 1001        ASSESSMENT & PLAN:   No problem-specific Assessment & Plan notes found for this encounter.    Cammie Sickle, MD 03/21/2020 6:47 AM

## 2020-03-21 NOTE — Progress Notes (Signed)
CRITICAL CARE CONSULT      Referring physician: Dr Posey Pronto  CHIEF COMPLAINT:   Acute hypoxemic respiratory failure   SUBJECTIVE FINDINGS & SIGNIFICANT EVENTS   17M from SNF with hx of CHF EF 45, COPD, Right Hip fracture s/p surg coming in for worsening dyspnea and hypoxemia. Per daughter Raymond Barnett patient has been declining for appx several months. He was found to have moderate pleural effusions bilaterally and multifocal pneumonia suggestive of CHF with possible overlying infection. BNP was >1200.    03/20/20- Met with daughter, reviewed findings and care plan. Discussed short term plan with cardiology and hospitalist team.   03/21/20- s/p thoracentesis on left with 500cc straw colored fluid.  Patient feels slighly improved was smiling and shook my hand during examination.   PAST MEDICAL HISTORY   Past Medical History:  Diagnosis Date  . (HFpEF) heart failure with improved ejection fraction ([place])    a. 08/2017 Echo: EF 45-50%; b. 10/2019 Echo: EF 55-60%. No rwma, mild LVH, Gr2 DD, RVSP 47.47mHg. Mod dil LA. Mod MR. Asc Ao 439m  . Arthritis   . Benign prostate hyperplasia    lower urinary tract symptoms  . Cataracts, bilateral   . COPD (chronic obstructive pulmonary disease) ([place]  . DDD (degenerative disc disease), lumbar   . Glaucoma   . H/O carpal tunnel syndrome   . History of osteomyelitis 1953   right leg  . Hypertension   . Moderate mitral regurgitation    a. 10/2019 Echo: Mod MR.  . Marland KitchenAD (peripheral artery disease) ([place]   a. 08/2017 LE ABI & Duplex: ABI R 0.60, L 1.07. 30-49% R popliteal and 50-74% R PT stenoses.  . Thrombocytopenia ([place]     SURGICAL HISTORY   Past Surgical History:  Procedure Laterality Date  . CARPAL TUNNEL RELEASE Left   . CARPAL TUNNEL RELEASE Right   . COLONOSCOPY   2015   Dr. [name]. HIP ARTHROPLASTY Right 03/03/2020   Procedure: ARTHROPLASTY BIPOLAR HIP (HEMIARTHROPLASTY);  Surgeon: MeHessie KnowsMD;  Location: [facility] ORS;  Service: Orthopedics;  Laterality: Right;  . LEG SURGERY Right 1953  . SKIN GRAFT Right 05/01/2017   [facility] TOTAL KNEE ARTHROPLASTY Right      FAMILY HISTORY   Family History  Problem Relation Age of Onset  . Colon cancer Mother 5134. Thrombosis Father 643. Heart disease Father        Heart valve problem  . AAA (abdominal aortic aneurysm) Father   . Hypertension Sister   . Hyperlipidemia Sister   . Hypertension Sister   . Hyperlipidemia Sister   . Hypertension Sister   . Hyperlipidemia Sister      SOCIAL HISTORY   Social History   Tobacco Use  . Smoking status: Former Smoker    Packs/day: 1.00    Years: 30.00    Pack years: 30.00    Types: Cigarettes    Quit date: 1995    Years since quitting: 26.9  . Smokeless tobacco: Never Used  Vaping Use  . Vaping Use: Never used  Substance Use Topics  . Alcohol use: Not Currently    Comment: occasionally wine   . Drug use: No     MEDICATIONS   Current Medication:  Current Facility-Administered Medications:  .  acetaminophen (TYLENOL) tablet 650 mg, 650 mg, Oral, Q6H PRN, 650 mg at 03/21/20 1410 **OR** acetaminophen (TYLENOL) suppository 650 mg, 650 mg, Rectal, Q6H PRN, Agbata,  Tochukwu, MD .  alum & mag hydroxide-simeth (MAALOX/MYLANTA) 200-200-20 MG/5ML suspension 30 mL, 30 mL, Oral, Q4H PRN, Agbata, Tochukwu, MD .  amiodarone (PACERONE) tablet 400 mg, 400 mg, Oral, BID, Fletcher Anon, Muhammad A, MD, 400 mg at 03/21/20 0926 .  apixaban (ELIQUIS) tablet 2.5 mg, 2.5 mg, Oral, BID, Lanney Gins, Bethenny Losee, MD, 2.5 mg at 03/21/20 1235 .  azithromycin (ZITHROMAX) tablet 500 mg, 500 mg, Oral, Daily, Fritzi Mandes, MD, 500 mg at 03/21/20 0927 .  bisacodyl (DULCOLAX) suppository 10 mg, 10 mg, Rectal, Daily PRN, Agbata, Tochukwu, MD, 10 mg at 03/17/20 1436 .   calcium-vitamin D (OSCAL WITH D) 500-200 MG-UNIT per tablet 1 tablet, 1 tablet, Oral, Daily, Agbata, Tochukwu, MD, 1 tablet at 03/21/20 0928 .  cefTRIAXone (ROCEPHIN) 2 g in sodium chloride 0.9 % 100 mL IVPB, 2 g, Intravenous, Q24H, Fritzi Mandes, MD, Last Rate: 200 mL/hr at 03/21/20 1414, 2 g at 03/21/20 1414 .  Chlorhexidine Gluconate Cloth 2 % PADS 6 each, 6 each, Topical, Daily, Fritzi Mandes, MD, 6 each at 03/21/20 [phone] .  cholecalciferol (VITAMIN D) tablet 1,000 Units, 1,000 Units, Oral, Daily, Agbata, Tochukwu, MD, 1,000 Units at 03/21/20 0926 .  docusate sodium (COLACE) capsule 100 mg, 100 mg, Oral, BID, Agbata, Tochukwu, MD, 100 mg at 03/21/20 0926 .  feeding supplement (ENSURE ENLIVE / ENSURE PLUS) liquid 237 mL, 237 mL, Oral, TID BM, Fritzi Mandes, MD, 237 mL at 03/21/20 1411 .  finasteride (PROSCAR) tablet 5 mg, 5 mg, Oral, Daily, Agbata, Tochukwu, MD, 5 mg at 03/21/20 0927 .  fluticasone (FLONASE) 50 MCG/ACT nasal spray 2 spray, 2 spray, Each Nare, Daily PRN, Agbata, Tochukwu, MD .  methocarbamol (ROBAXIN) tablet 500 mg, 500 mg, Oral, Q6H PRN, Agbata, Tochukwu, MD, 500 mg at 03/21/20 0314 .  multivitamin with minerals tablet 1 tablet, 1 tablet, Oral, Daily, Fritzi Mandes, MD, 1 tablet at 03/21/20 0926 .  ondansetron (ZOFRAN) tablet 4 mg, 4 mg, Oral, Q6H PRN **OR** ondansetron (ZOFRAN) injection 4 mg, 4 mg, Intravenous, Q6H PRN, Agbata, Tochukwu, MD, 4 mg at 03/20/20 0742 .  pantoprazole (PROTONIX) EC tablet 40 mg, 40 mg, Oral, Daily, Agbata, Tochukwu, MD, 40 mg at 03/21/20 0928 .  polyethylene glycol (MIRALAX / GLYCOLAX) packet 17 g, 17 g, Oral, QHS, Agbata, Tochukwu, MD, 17 g at 03/20/20 2115 .  polyvinyl alcohol (LIQUIFILM TEARS) 1.4 % ophthalmic solution 1 drop, 1 drop, Both Eyes, PRN, Agbata, Tochukwu, MD .  timolol (TIMOPTIC) 0.5 % ophthalmic solution 1 drop, 1 drop, Both Eyes, BID, Agbata, Tochukwu, MD, 1 drop at 03/21/20 0929 .  traZODone (DESYREL) tablet 100 mg, 100 mg, Oral, QHS, Agbata,  Tochukwu, MD, 100 mg at 03/20/20 2115 .  zolpidem (AMBIEN) tablet 5 mg, 5 mg, Oral, QHS PRN, Fritzi Mandes, MD    ALLERGIES   Patient has no known allergies.    REVIEW OF SYSTEMS     10 point ROS done and is negative except for dyspnea  PHYSICAL EXAMINATION   Vitals:   03/21/20 1400 03/21/20 1505  BP: 101/70 90/62  Pulse: 91 99  Resp: (!) 27 (!) 26  Temp:    SpO2: 98% 98%    GENERAL:age appropirate HEAD: Normocephalic, atraumatic.  EYES: Pupils equal, round, reactive to light.  No scleral icterus.  MOUTH: Moist mucosal membrane. NECK: Supple. No thyromegaly. No nodules. No JVD.  PULMONARY: bilateral rhonchi CARDIOVASCULAR: S1 and S2. Regular rate and rhythm. No murmurs, rubs, or gallops.  GASTROINTESTINAL: Soft, nontender, non-distended. No masses. Positive bowel sounds.  No hepatosplenomegaly.  MUSCULOSKELETAL: No swelling, clubbing, or edema.  NEUROLOGIC: Mild distress due to acute illness SKIN:intact,warm,dry   PERTINENT DATA      Lines / Drains: PIV x 2  Cultures / Sepsis markers: -Blood cultures -negative x 4d -COIVID -negative -FLUa&b - negative -RSV- negative  Antibiotics: Rocephin and Zithromax - started 11/29   Protocols / Consultants: Cardio, pccm, hospitalist, palliative  Tests / Events: Thoracentesis - 11/29 US abdomen -11/29 -RVP-11/29 -respiratory culture -11/29     Infusions: . cefTRIAXone (ROCEPHIN)  IV 2 g (03/21/20 1414)   Scheduled Medications: . amiodarone  400 mg Oral BID  . apixaban  2.5 mg Oral BID  . azithromycin  500 mg Oral Daily  . calcium-vitamin D  1 tablet Oral Daily  . Chlorhexidine Gluconate Cloth  6 each Topical Daily  . cholecalciferol  1,000 Units Oral Daily  . docusate sodium  100 mg Oral BID  . feeding supplement  237 mL Oral TID BM  . finasteride  5 mg Oral Daily  . multivitamin with minerals  1 tablet Oral Daily  . pantoprazole  40 mg Oral Daily  . polyethylene glycol  17 g Oral QHS  . timolol   1 drop Both Eyes BID  . traZODone  100 mg Oral QHS   PRN Medications: acetaminophen **OR** acetaminophen, alum & mag hydroxide-simeth, bisacodyl, fluticasone, methocarbamol, ondansetron **OR** ondansetron (ZOFRAN) IV, polyvinyl alcohol, zolpidem Hemodynamic parameters:   Intake/Output: 11/29 0701 - 11/30 0700 In: 1141.8 [P.O.:640; I.V.:401.8; IV Piggyback:100] Out: 1400 [Urine:1400]  Ventilator  Settings: FiO2 (%):  [65 %-85 %] 85 %   Other Labs:    LAB RESULTS:  Basic Metabolic Panel: Recent Labs  Lab 02/23/2020 0920 03/21/2020 0920 03/17/20 0347 03/17/20 4259 03/18/20 0205 03/18/20 0205 03/19/20 5638 03/19/20 0512 03/20/20 0531 03/21/20 0533  NA 132*  --  135  --  134*  --  134*  --   --  134*  K 4.2   < > 4.1   < > 3.0*   < > 3.1*   < > 3.7 4.1  CL 94*  --  94*  --  93*  --  93*  --   --  93*  CO2 26  --  27  --  28  --  28  --   --  29  GLUCOSE 128*  --  111*  --  140*  --  128*  --   --  121*  BUN 18  --  24*  --  34*  --  45*  --   --  60*  CREATININE 0.82  --  0.88  --  1.12  --  1.17  --   --  1.70*  CALCIUM 9.0  --  9.1  --  8.9  --  8.6*  --   --  8.6*  MG  --   --   --   --   --   --   --   --  2.0 2.0  PHOS  --   --   --   --   --   --   --   --   --  2.7   < > = values in this interval not displayed.   Liver Function Tests: Recent Labs  Lab 03/05/2020 0920 03/20/20 0716 03/21/20 0533  AST 57* 48*  --   ALT 35 34  --   ALKPHOS 88 93  --   BILITOT 3.2* 2.0*  --  PROT 6.3* 5.7*  --   ALBUMIN 3.1* 2.7* 2.8*   No results for input(s): LIPASE, AMYLASE in the last 168 hours. No results for input(s): AMMONIA in the last 168 hours. CBC: Recent Labs  Lab 03/09/2020 0753 03/10/2020 0753 03/17/20 0624 03/18/20 0205 03/19/20 0512 03/20/20 0531 03/21/20 0533  WBC 15.8*   < > 15.4* 13.9* 15.8* 12.9* 17.4*  NEUTROABS 13.6*  --   --   --   --   --   --   HGB 10.4*   < > 10.4* 9.9* 9.6* 8.9*  8.9* 9.4*  HCT 33.0*   < > 33.1* 30.7* 29.0* 29.1* 30.0*  MCV  100.3*   < > 100.6* 98.4 98.0 101.4* 100.0  PLT 275   < > 239 247 225 209 188   < > = values in this interval not displayed.   Cardiac Enzymes: No results for input(s): CKTOTAL, CKMB, CKMBINDEX, TROPONINI in the last 168 hours. BNP: Invalid input(s): POCBNP CBG: Recent Labs  Lab 03/17/2020 1202  GLUCAP 116*     IMAGING RESULTS:  Imaging: DG Chest Port 1 View  Result Date: 03/21/2020 CLINICAL DATA:  Post LEFT thoracentesis EXAM: PORTABLE CHEST 1 VIEW COMPARISON:  Portable exam 1155 hours compared to 0855 hours FINDINGS: Enlargement of cardiac silhouette. Diffuse BILATERAL pulmonary infiltrates. Decreased LEFT pleural effusion post thoracentesis. No pneumothorax or acute osseous findings. IMPRESSION: No pneumothorax following LEFT thoracentesis. Persistent diffuse BILATERAL pulmonary infiltrates Electronically Signed   By: Lavonia Dana M.D.   On: 03/21/2020 12:10   DG Chest Port 1 View  Result Date: 03/21/2020 CLINICAL DATA:  Acute on chronic hypoxic respiratory failure, CHF EXAM: PORTABLE CHEST 1 VIEW COMPARISON:  Chest radiograph from one day prior. FINDINGS: Stable cardiomediastinal silhouette with mild cardiomegaly. No pneumothorax. Small bilateral pleural effusions, left greater than right, mildly increased. Diffuse patchy hazy opacities throughout both lungs, slightly worsened. IMPRESSION: 1. Mild cardiomegaly. 2. Small bilateral pleural effusions, left greater than right, mildly increased. 3. Diffuse patchy hazy opacities throughout both lungs, slightly worsened, differential includes cardiogenic pulmonary edema, multifocal pneumonia or ARDS. Electronically Signed   By: Ilona Sorrel M.D.   On: 03/21/2020 09:04   DG Chest Port 1 View  Result Date: 03/20/2020 CLINICAL DATA:  Post thoracentesis EXAM: PORTABLE CHEST 1 VIEW COMPARISON:  Radiograph 03/20/2020 FINDINGS: Interval decrease in the size of a right pleural effusion post thoracentesis. No large pneumothorax. Persistent left  effusion and heterogeneous airspace opacities bilaterally, not significantly changed from comparison. Cardiomediastinal contours are partially obscured by opacity albeit with stable cardiomegaly and a calcified aorta. No acute osseous or soft tissue abnormality. Degenerative changes are present in the imaged spine and shoulders. Telemetry leads overlie the chest. IMPRESSION: 1. Interval decrease in size of a right pleural effusion post thoracentesis. No large pneumothorax. 2. Otherwise unchanged left effusion and bilateral heterogeneous opacities possibly asymmetric edema and/or infection. Electronically Signed   By: Lovena Le M.D.   On: 03/20/2020 16:55   DG Chest Port 1 View  Result Date: 03/20/2020 CLINICAL DATA:  Respiratory distress. EXAM: PORTABLE CHEST 1 VIEW COMPARISON:  02/26/2020 and chest CT 03/05/2020 FINDINGS: A single view of the chest demonstrates patchy bilateral lung densities. Evidence for bilateral pleural effusions. Airspace densities in the right mid lung has slightly worsened but slightly decreased in the left mid lung. Heart size is slightly prominent but accentuated by technique. Heart size is grossly stable. Negative for pneumothorax. IMPRESSION: 1. Persistent bilateral lung densities that have slightly changed as  described. Findings are suggestive for asymmetric pulmonary edema. Infection cannot be excluded. 2. Bilateral pleural effusions. Electronically Signed   By: Markus Daft M.D.   On: 03/20/2020 10:40   US Abdomen Limited RUQ (LIVER/GB)  Result Date: 03/21/2020 CLINICAL DATA:  Hyperbilirubinemia. EXAM: ULTRASOUND ABDOMEN LIMITED RIGHT UPPER QUADRANT COMPARISON:  CT abdomen pelvis 02/21/2020 FINDINGS: Gallbladder: Dense shadowing throughout the gallbladder compatible with calcification. There is peripheral calcification in the gallbladder fundus on the prior CT. This is presumably due to calcified gallstones although gallbladder wall calcification could have a similar  appearance. Negative sonographic Murphy sign. Common bile duct: Diameter: 2.5 mm Liver: Increased echogenicity liver diffusely without focal liver lesion. Portal vein is patent on color Doppler imaging with normal direction of blood flow towards the liver. Other: None. IMPRESSION: Extensive shadowing due to gallbladder calcification. This is presumably a large gallstone although gallbladder wall calcification could also be present. This limits evaluation of the gallbladder lumen. No biliary dilatation Hyperechoic liver suggesting fatty infiltration. Electronically Signed   By: Franchot Gallo M.D.   On: 03/21/2020 09:26   US THORACENTESIS ASP PLEURAL SPACE W/IMG GUIDE  Result Date: 03/21/2020 INDICATION: Patient with history of CHF, COPD, respiratory failure ,atrial fibrillation, bilateral pleural effusions; status post right thoracentesis on 03/20/20; request now received for diagnostic and therapeutic left thoracentesis. EXAM: ULTRASOUND GUIDED DIAGNOSTIC THERAPEUTIC LEFT THORACENTESIS MEDICATIONS: 1% lidocaine to skin and subcutaneous tissue COMPLICATIONS: None immediate. PROCEDURE: An ultrasound guided thoracentesis was thoroughly discussed with the patient and questions answered. The benefits, risks, alternatives and complications were also discussed. The patient understands and wishes to proceed with the procedure. Written consent was obtained. Ultrasound was performed to localize and mark an adequate pocket of fluid in the left chest. The area was then prepped and draped in the normal sterile fashion. 1% Lidocaine was used for local anesthesia. Under ultrasound guidance a 6 Fr Safe-T-Centesis catheter was introduced. Thoracentesis was performed. The catheter was removed and a dressing applied. FINDINGS: A total of approximately 550 cc of yellow fluid was removed. Samples were sent to the laboratory as requested by the clinical team. IMPRESSION: Successful ultrasound guided diagnostic and therapeutic left  thoracentesis yielding 550 cc of pleural fluid. Read by: Rowe Robert, PA-C Electronically Signed   By: Markus Daft M.D.   On: 03/21/2020 11:52   US THORACENTESIS ASP PLEURAL SPACE W/IMG GUIDE  Result Date: 03/20/2020 INDICATION: Patient with history of acute hypoxic respiratory failure with bilateral pleural effusions. Request is for therapeutic and diagnostic thoracentesis EXAM: ULTRASOUND GUIDED THERAPEUTIC AND DIAGNOSTIC THORACENTESIS MEDICATIONS: Lidocaine 1% 10 mL COMPLICATIONS: None immediate. PROCEDURE: An ultrasound guided thoracentesis was thoroughly discussed with the patient and questions answered. The benefits, risks, alternatives and complications were also discussed. The patient understands and wishes to proceed with the procedure. Written consent was obtained. Ultrasound was performed to localize and mark an adequate pocket of fluid in the right chest. The area was then prepped and draped in the normal sterile fashion. 1% Lidocaine was used for local anesthesia. Under ultrasound guidance a 6 Fr Safe-T-Centesis catheter was introduced. Thoracentesis was performed. The catheter was removed and a dressing applied. FINDINGS: A total of approximately 450 mL of straw-colored fluid was removed. Samples were sent to the laboratory as requested by the clinical team. IMPRESSION: Successful ultrasound guided therapeutic and diagnostic right-sided thoracentesis yielding 450 mL of pleural fluid. Read by: Rushie Nyhan, NP Electronically Signed   By: Aletta Edouard M.D.   On: 03/20/2020 16:21  ASSESSMENT AND PLAN    -Multidisciplinary rounds held today  Acute Hypoxic Respiratory Failure -due to acute decompensated systolic CHF with EF <54% -complicated by pneumonia of yet unclear etiology, bilateral atelectasis and bilateral pleural effusions -holding diuretic for now due to AKI -thoracentesis - IR consult -s/p right pleural space tap 11/29 and left thoracentesis on 11/30 -RVP -resp  culture -empiric CAP regimen -RHC plan per cardiology - appreciate input   New onset Atrial fibrillation   -TSH is normal  - possible due to infection/sepsis vs worsening heart failure  -on heparin gtt  - on amio and digoxin  -cardiology on case - appreciate input   Chronic COPD  - no phelgm production on examination  - no signs of acute exacerbation on exam    Jaundice   - daughter at bedside also confirms that patient appears orange compared to his normal. Will continue to monitor LFTs and obtain US Liver    ID -continue IV abx as prescibed -follow up cultures  GI/Nutrition GI PROPHYLAXIS as indicated DIET-->TF's as tolerated Constipation protocol as indicated  ENDO - ICU hypoglycemic\Hyperglycemia protocol -check FSBS per protocol   ELECTROLYTES -follow labs as needed -replace as needed -pharmacy consultation   DVT/GI PRX ordered -SCDs  TRANSFUSIONS AS NEEDED MONITOR FSBS ASSESS the need for LABS as needed   Critical care provider statement:    Critical care time (minutes):  33   Critical care time was exclusive of:  Separately billable procedures and treating other patients   Critical care was necessary to treat or prevent imminent or life-threatening deterioration of the following conditions:  acute hypoxemic respiratory failure, acute decompensated CHF, new onset Atrial fibrillation, multple comorbid conditions   Critical care was time spent personally by me on the following activities:  Development of treatment plan with patient or surrogate, discussions with consultants, evaluation of patient's response to treatment, examination of patient, obtaining history from patient or surrogate, ordering and performing treatments and interventions, ordering and review of laboratory studies and re-evaluation of patient's condition.  I assumed direction of critical care for this patient from another provider in my specialty: no    This document was prepared using  Dragon voice recognition software and may include unintentional dictation errors.    Ottie Glazier, M.D.  Division of [place]

## 2020-03-21 NOTE — Progress Notes (Signed)
[facility] NAME: Raymond Barnett    MR#:  [id number]  DATE OF BIRTH:  [birth year]  SUBJECTIVE:  patient came in from [facility] with increasing shortness of breath and hypoxia. He was discharged on 16 November after hip fracture surgery.  Some intermittent confusion-- slept few hours per pt Cannot get comfortable in the bed  On HHFNC 85/50 sats 88-93% (increased oxygen requirement)  REVIEW OF SYSTEMS:   Review of Systems  Constitutional: Positive for malaise/fatigue. Negative for chills, fever and weight loss.  HENT: Negative for ear discharge, ear pain and nosebleeds.   Eyes: Negative for blurred vision, pain and discharge.  Respiratory: Positive for shortness of breath. Negative for sputum production, wheezing and stridor.   Cardiovascular: Negative for chest pain, palpitations, orthopnea and PND.  Gastrointestinal: Positive for nausea. Negative for abdominal pain, diarrhea and vomiting.  Genitourinary: Negative for frequency and urgency.  Musculoskeletal: Negative for back pain and joint pain.  Neurological: Positive for weakness. Negative for sensory change, speech change and focal weakness.  Psychiatric/Behavioral: Negative for depression and hallucinations. The patient is not nervous/anxious.    Tolerating Diet:yes Tolerating PT: SNF  DRUG ALLERGIES:  No Known Allergies  VITALS:  Blood pressure 123/69, pulse (!) 105, temperature 98.2 F (36.8 C), temperature source Oral, resp. rate (!) 22, height 5\' 10"  (1.778 m), weight 93 kg, SpO2 91 %.  PHYSICAL EXAMINATION:   Physical Exam  GENERAL:  86 y.o.-year-old patient lying in the bed with mild acute resp  distress. Appears chronically ill, deconditioned HEENT: Head atraumatic, normocephalic. Oropharynx and nasopharynx clear.  HHFNC +  LUNGS: decreased breath sounds bilaterally, no wheezing, bibasilar rales, no [email or web address] intermittent resp distress CARDIOVASCULAR:  S1, S2 normal. No murmurs, rubs, or gallops. tachycardia ABDOMEN: Soft, nontender, nondistended. Bowel sounds present. No organomegaly or mass.  EXTREMITIES: No cyanosis, clubbing or edema b/l.    NEUROLOGIC: Cranial nerves II through XII are intact. No focal Motor or sensory deficits b/l.  weak PSYCHIATRIC:  patient is alert and oriented x 2 SKIN: No obvious rash, lesion, or ulcer.     LABORATORY PANEL:  CBC Recent Labs  Lab 03/21/20 0533  WBC 17.4*  HGB 9.4*  HCT 30.0*  PLT 188    Chemistries  Recent Labs  Lab 03/19/20 0512 03/20/20 0716 03/21/20 0533  NA   < >  --  134*  K   < >  --  4.1  CL   < >  --  93*  CO2   < >  --  29  GLUCOSE   < >  --  121*  BUN   < >  --  60*  CREATININE   < >  --  1.70*  CALCIUM   < >  --  8.6*  MG   < >  --  2.0  AST  --  48*  --   ALT  --  34  --   ALKPHOS  --  93  --   BILITOT  --  2.0*  --    < > = values in this interval not displayed.   Cardiac Enzymes No results for input(s): TROPONINI in the last 168 hours. RADIOLOGY:  DG Chest Port 1 View  Result Date: 03/21/2020 CLINICAL DATA:  Post LEFT thoracentesis EXAM: PORTABLE CHEST 1 VIEW COMPARISON:  Portable exam 1155 hours compared to 0855 hours FINDINGS: Enlargement of cardiac silhouette. Diffuse BILATERAL pulmonary infiltrates. Decreased LEFT pleural effusion  post thoracentesis. No pneumothorax or acute osseous findings. IMPRESSION: No pneumothorax following LEFT thoracentesis. Persistent diffuse BILATERAL pulmonary infiltrates Electronically Signed   By: Lavonia Dana M.D.   On: 03/21/2020 12:10   DG Chest Port 1 View  Result Date: 03/21/2020 CLINICAL DATA:  Acute on chronic hypoxic respiratory failure, CHF EXAM: PORTABLE CHEST 1 VIEW COMPARISON:  Chest radiograph from one day prior. FINDINGS: Stable cardiomediastinal silhouette with mild cardiomegaly. No pneumothorax. Small bilateral pleural effusions, left greater than right, mildly increased. Diffuse patchy hazy opacities  throughout both lungs, slightly worsened. IMPRESSION: 1. Mild cardiomegaly. 2. Small bilateral pleural effusions, left greater than right, mildly increased. 3. Diffuse patchy hazy opacities throughout both lungs, slightly worsened, differential includes cardiogenic pulmonary edema, multifocal pneumonia or ARDS. Electronically Signed   By: Ilona Sorrel M.D.   On: 03/21/2020 09:04   DG Chest Port 1 View  Result Date: 03/20/2020 CLINICAL DATA:  Post thoracentesis EXAM: PORTABLE CHEST 1 VIEW COMPARISON:  Radiograph 03/20/2020 FINDINGS: Interval decrease in the size of a right pleural effusion post thoracentesis. No large pneumothorax. Persistent left effusion and heterogeneous airspace opacities bilaterally, not significantly changed from comparison. Cardiomediastinal contours are partially obscured by opacity albeit with stable cardiomegaly and a calcified aorta. No acute osseous or soft tissue abnormality. Degenerative changes are present in the imaged spine and shoulders. Telemetry leads overlie the chest. IMPRESSION: 1. Interval decrease in size of a right pleural effusion post thoracentesis. No large pneumothorax. 2. Otherwise unchanged left effusion and bilateral heterogeneous opacities possibly asymmetric edema and/or infection. Electronically Signed   By: Lovena Le M.D.   On: 03/20/2020 16:55   DG Chest Port 1 View  Result Date: 03/20/2020 CLINICAL DATA:  Respiratory distress. EXAM: PORTABLE CHEST 1 VIEW COMPARISON:  03/04/2020 and chest CT 03/13/2020 FINDINGS: A single view of the chest demonstrates patchy bilateral lung densities. Evidence for bilateral pleural effusions. Airspace densities in the right mid lung has slightly worsened but slightly decreased in the left mid lung. Heart size is slightly prominent but accentuated by technique. Heart size is grossly stable. Negative for pneumothorax. IMPRESSION: 1. Persistent bilateral lung densities that have slightly changed as described. Findings  are suggestive for asymmetric pulmonary edema. Infection cannot be excluded. 2. Bilateral pleural effusions. Electronically Signed   By: Markus Daft M.D.   On: 03/20/2020 10:40   US Abdomen Limited RUQ (LIVER/GB)  Result Date: 03/21/2020 CLINICAL DATA:  Hyperbilirubinemia. EXAM: ULTRASOUND ABDOMEN LIMITED RIGHT UPPER QUADRANT COMPARISON:  CT abdomen pelvis 03/06/2020 FINDINGS: Gallbladder: Dense shadowing throughout the gallbladder compatible with calcification. There is peripheral calcification in the gallbladder fundus on the prior CT. This is presumably due to calcified gallstones although gallbladder wall calcification could have a similar appearance. Negative sonographic Murphy sign. Common bile duct: Diameter: 2.5 mm Liver: Increased echogenicity liver diffusely without focal liver lesion. Portal vein is patent on color Doppler imaging with normal direction of blood flow towards the liver. Other: None. IMPRESSION: Extensive shadowing due to gallbladder calcification. This is presumably a large gallstone although gallbladder wall calcification could also be present. This limits evaluation of the gallbladder lumen. No biliary dilatation Hyperechoic liver suggesting fatty infiltration. Electronically Signed   By: Franchot Gallo M.D.   On: 03/21/2020 09:26   US THORACENTESIS ASP PLEURAL SPACE W/IMG GUIDE  Result Date: 03/21/2020 INDICATION: Patient with history of CHF, COPD, respiratory failure ,atrial fibrillation, bilateral pleural effusions; status post right thoracentesis on 03/20/20; request now received for diagnostic and therapeutic left thoracentesis. EXAM: ULTRASOUND GUIDED  DIAGNOSTIC THERAPEUTIC LEFT THORACENTESIS MEDICATIONS: 1% lidocaine to skin and subcutaneous tissue COMPLICATIONS: None immediate. PROCEDURE: An ultrasound guided thoracentesis was thoroughly discussed with the patient and questions answered. The benefits, risks, alternatives and complications were also discussed. The patient  understands and wishes to proceed with the procedure. Written consent was obtained. Ultrasound was performed to localize and mark an adequate pocket of fluid in the left chest. The area was then prepped and draped in the normal sterile fashion. 1% Lidocaine was used for local anesthesia. Under ultrasound guidance a 6 Fr Safe-T-Centesis catheter was introduced. Thoracentesis was performed. The catheter was removed and a dressing applied. FINDINGS: A total of approximately 550 cc of yellow fluid was removed. Samples were sent to the laboratory as requested by the clinical team. IMPRESSION: Successful ultrasound guided diagnostic and therapeutic left thoracentesis yielding 550 cc of pleural fluid. Read by: Rowe Robert, PA-C Electronically Signed   By: Markus Daft M.D.   On: 03/21/2020 11:52   US THORACENTESIS ASP PLEURAL SPACE W/IMG GUIDE  Result Date: 03/20/2020 INDICATION: Patient with history of acute hypoxic respiratory failure with bilateral pleural effusions. Request is for therapeutic and diagnostic thoracentesis EXAM: ULTRASOUND GUIDED THERAPEUTIC AND DIAGNOSTIC THORACENTESIS MEDICATIONS: Lidocaine 1% 10 mL COMPLICATIONS: None immediate. PROCEDURE: An ultrasound guided thoracentesis was thoroughly discussed with the patient and questions answered. The benefits, risks, alternatives and complications were also discussed. The patient understands and wishes to proceed with the procedure. Written consent was obtained. Ultrasound was performed to localize and mark an adequate pocket of fluid in the right chest. The area was then prepped and draped in the normal sterile fashion. 1% Lidocaine was used for local anesthesia. Under ultrasound guidance a 6 Fr Safe-T-Centesis catheter was introduced. Thoracentesis was performed. The catheter was removed and a dressing applied. FINDINGS: A total of approximately 450 mL of straw-colored fluid was removed. Samples were sent to the laboratory as requested by the clinical  team. IMPRESSION: Successful ultrasound guided therapeutic and diagnostic right-sided thoracentesis yielding 450 mL of pleural fluid. Read by: Rushie Nyhan, NP Electronically Signed   By: Aletta Edouard M.D.   On: 03/20/2020 16:21   ASSESSMENT AND PLAN:  THADDEAUS MONICA is a 86 y.o. male with medical history significant for chronic diastolic dysfunction CHF, COPD, status post recent right total hip arthroplasty and peripheral arterial disease who presents from the skilled nursing facility for evaluation of shortness of breath.  Shortness of breath is worse with any form of exertion and is associated with a cough productive of clear phlegm.  He has no lower extremity swelling. He denies having any orthopnea.  Patient states that his movement has been limited due to shortness of breath as well as pain in his hip.  Acute on chronic hypoxic respiratory failure secondary to CHF acute on chronic diastolic congestive heart failure -- patient came in from [facility] with sats 82% on room air. He was placed on not rebreather -- currently on heated high flow nasal cannula Fio2 70%  -- IV Lasix 40 mg BID- urine output 9.2 L -- appreciate [facility] cardiology input -- elevated BNP -- repeat Echo shows EF 50-55% --BP soft --Hold Ace inhibitor (lisinopril was held at d/c on nov 16th due to low BP) -- pulmonary consultation. Case discussed with Dr.Aleskerov. -- 11/29--Patient s/p right thoracentesis by IR at bedside--450 cc  --11/30 s/p left sided thoracentesis --550 cc  -IV lasix holiday today per cards since creat upto 1.7  Mild leukocytosis with questionable pneumonia  on x-ray --clinically does not appear pneumonia -MRSA PCR negative-- DC vancomycin -- Pro calcitonin normal -11/29-- chest x-ray not much improvement. Discussed with pulmonary. Empiric antibiotic Rocephin and zithromax  Atrial fibrillation with RVR new onset in the setting of CHF -- was on IV diltiazem drip per cardiology--d/ced --  continue heparin drip given elevated CHADSVAC2 score --11/27--Per Dr Rockey Situ wean cardizem down and start IV amiodarone Bolus + gtt to see if pt converts to SR --11/28--cont amiodarone gtt and heparin gtt --11/29-- change to oral amiodarone and continue IV heparin drip.  --11/30--now on po eliquis  Recent right hip fracture status post surgery -- patient went to [facility] November 16 --Pt recs rehab  COPD chronic -PRN nebs  BPH continue Proscar  History of peripheral arterial disease continue aspirin  Glaucoma continue Timolol eye drops  Procedures: Bilateral Thoracentesis Family communication :dter Arville Go at bedside 11/29 Consults :cardiology [facility] CODE STATUS: DNR prior to arrival DVT Prophylaxis :eliquis  Status is: Inpatient  Remains inpatient appropriate because:IV treatments appropriate due to intensity of illness or inability to take PO   Dispo: The patient is from: SNF              Anticipated d/c is to: SNF              Anticipated d/c date is: 3 days              Patient currently is not medically stable to d/c.  Patient admitted with acute on chronic hypoxic respiratory failure currently requiring heated high flow nasal cannula. Getting aggressive IV Lasix for CHF treatment. His Fio2% to 85% requirement has gone up. Started on empiric abxs  TOC for d/c planning--per dter pt does not want to return to [facility] THIS PATIENT: 25 minutes.  >50% time spent on counselling and coordination of care  Note: This dictation was prepared with Dragon dictation along with smaller phrase technology. Any transcriptional errors that result from this process are unintentional.  Fritzi Mandes M.D    [facility]   CC: Primary care physician; [name], DOPatient ID: [name], male   DOB: [birth year], 86 y.o.   MRN: [id number]

## 2020-03-21 NOTE — Assessment & Plan Note (Deleted)
#  Chronic low platelets [~90s to 100] stable at least since 2006. The etiology is unclear- ITP versus MDS [never had bone marrow biopsy]. 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  Today's platelet count is 172 hemoglobin 13.4 white count 7.1.   Patient continues to be asymptomatic without any bleeding episodes- recommend surveillance.  # DISPOSITION: # Patient follow-up with [place] in 3 months- MD;labs- cbc/cmp/LD[facility]- Dr.B

## 2020-03-21 NOTE — Progress Notes (Addendum)
Progress Note  Patient Name: ZAIDE KARDELL Date of Encounter: 03/21/2020  [facility] Cardiologist: Nelva Bush, MD   Subjective   Breathing is about the same compared to yesterday, has high flow nasal oxygen, satting 91%.  Chest x-ray reviewed with intensivist, findings on chest x-ray suggests other etiology contributing to patient's symptoms and not only CHF.   Inpatient Medications    Scheduled Meds: . amiodarone  400 mg Oral BID  . azithromycin  500 mg Oral Daily  . calcium-vitamin D  1 tablet Oral Daily  . Chlorhexidine Gluconate Cloth  6 each Topical Daily  . cholecalciferol  1,000 Units Oral Daily  . docusate sodium  100 mg Oral BID  . feeding supplement  237 mL Oral TID BM  . finasteride  5 mg Oral Daily  . furosemide  60 mg Intravenous Q12H  . multivitamin with minerals  1 tablet Oral Daily  . pantoprazole  40 mg Oral Daily  . polyethylene glycol  17 g Oral QHS  . timolol  1 drop Both Eyes BID  . traZODone  100 mg Oral QHS   Continuous Infusions: . cefTRIAXone (ROCEPHIN)  IV Stopped (03/20/20 1446)  . heparin 1,050 Units/hr (03/21/20 0600)   PRN Meds: acetaminophen **OR** acetaminophen, alum & mag hydroxide-simeth, bisacodyl, fluticasone, ipratropium-albuterol, methocarbamol, ondansetron **OR** ondansetron (ZOFRAN) IV, polyvinyl alcohol, zolpidem   Vital Signs    Vitals:   03/21/20 0500 03/21/20 0600 03/21/20 0700 03/21/20 0800  BP: 115/71 124/71 109/65 112/65  Pulse: 92 (!) 107 (!) 106 68  Resp: (!) 23 (!) 27 18 (!) 29  Temp:    98.2 F (36.8 C)  TempSrc:    Oral  SpO2: 92% 90% 94% (!) 89%  Weight:      Height:        Intake/Output Summary (Last 24 hours) at 03/21/2020 0912 Last data filed at 03/21/2020 0600 Gross per 24 hour  Intake 815.33 ml  Output 1250 ml  Net -434.67 ml   Last 3 Weights 03/20/2020 03/06/2020 03/05/2020  Weight (lbs) 205 lb 225 lb 12 oz 222 lb 7.1 oz  Weight (kg) 92.987 kg 102.4 kg 100.9 kg      Telemetry      Atrial fibrillation heart rate 99-102- Personally Reviewed  ECG    No new tracing reviewed- Personally Reviewed  Physical Exam   GEN: No acute distress.   Neck: No JVD Cardiac:  Irregular irregular Respiratory:  Crackles at bases GI: Soft, nontender, non-distended  MS: No edema; No deformity. Neuro:  Nonfocal  Psych: Normal affect   Labs    High Sensitivity Troponin:   Recent Labs  Lab 03/03/20 0020 03/02/2020 0753 03/19/2020 1046 03/21/2020 1242  TROPONINIHS 49* 159* 244* 294*      Chemistry Recent Labs  Lab 03/06/2020 0920 03/17/20 8921 03/18/20 0205 03/18/20 0205 03/19/20 0512 03/20/20 0531 03/20/20 0716 03/21/20 0533  NA 132*   < > 134*  --  134*  --   --  134*  K 4.2   < > 3.0*   < > 3.1* 3.7  --  4.1  CL 94*   < > 93*  --  93*  --   --  93*  CO2 26   < > 28  --  28  --   --  29  GLUCOSE 128*   < > 140*  --  128*  --   --  121*  BUN 18   < > 34*  --  45*  --   --  60*  CREATININE 0.82   < > 1.12  --  1.17  --   --  1.70*  CALCIUM 9.0   < > 8.9  --  8.6*  --   --  8.6*  PROT 6.3*  --   --   --   --   --  5.7*  --   ALBUMIN 3.1*  --   --   --   --   --  2.7* 2.8*  AST 57*  --   --   --   --   --  48*  --   ALT 35  --   --   --   --   --  34  --   ALKPHOS 88  --   --   --   --   --  93  --   BILITOT 3.2*  --   --   --   --   --  2.0*  --   GFRNONAA >60   < > >60  --  60*  --   --  38*  ANIONGAP 12   < > 13  --  13  --   --  12   < > = values in this interval not displayed.     Hematology Recent Labs  Lab 03/19/20 0512 03/20/20 0531 03/21/20 0533  WBC 15.8* 12.9* 17.4*  RBC 2.96* 2.87* 3.00*  HGB 9.6* 8.9*  8.9* 9.4*  HCT 29.0* 29.1* 30.0*  MCV 98.0 101.4* 100.0  MCH 32.4 31.0 31.3  MCHC 33.1 30.6 31.3  RDW 24.0* 24.2* 23.7*  PLT 225 209 188    BNP Recent Labs  Lab 03/02/2020 0753  BNP 1,276.9*     DDimer No results for input(s): DDIMER in the last 168 hours.   Radiology    DG Chest Port 1 View  Result Date: 03/21/2020 CLINICAL DATA:   Acute on chronic hypoxic respiratory failure, CHF EXAM: PORTABLE CHEST 1 VIEW COMPARISON:  Chest radiograph from one day prior. FINDINGS: Stable cardiomediastinal silhouette with mild cardiomegaly. No pneumothorax. Small bilateral pleural effusions, left greater than right, mildly increased. Diffuse patchy hazy opacities throughout both lungs, slightly worsened. IMPRESSION: 1. Mild cardiomegaly. 2. Small bilateral pleural effusions, left greater than right, mildly increased. 3. Diffuse patchy hazy opacities throughout both lungs, slightly worsened, differential includes cardiogenic pulmonary edema, multifocal pneumonia or ARDS. Electronically Signed   By: Ilona Sorrel M.D.   On: 03/21/2020 09:04   DG Chest Port 1 View  Result Date: 03/20/2020 CLINICAL DATA:  Post thoracentesis EXAM: PORTABLE CHEST 1 VIEW COMPARISON:  Radiograph 03/20/2020 FINDINGS: Interval decrease in the size of a right pleural effusion post thoracentesis. No large pneumothorax. Persistent left effusion and heterogeneous airspace opacities bilaterally, not significantly changed from comparison. Cardiomediastinal contours are partially obscured by opacity albeit with stable cardiomegaly and a calcified aorta. No acute osseous or soft tissue abnormality. Degenerative changes are present in the imaged spine and shoulders. Telemetry leads overlie the chest. IMPRESSION: 1. Interval decrease in size of a right pleural effusion post thoracentesis. No large pneumothorax. 2. Otherwise unchanged left effusion and bilateral heterogeneous opacities possibly asymmetric edema and/or infection. Electronically Signed   By: Lovena Le M.D.   On: 03/20/2020 16:55   DG Chest Port 1 View  Result Date: 03/20/2020 CLINICAL DATA:  Respiratory distress. EXAM: PORTABLE CHEST 1 VIEW COMPARISON:  03/04/2020 and chest CT 03/03/2020 FINDINGS: A single view of the chest  demonstrates patchy bilateral lung densities. Evidence for bilateral pleural effusions.  Airspace densities in the right mid lung has slightly worsened but slightly decreased in the left mid lung. Heart size is slightly prominent but accentuated by technique. Heart size is grossly stable. Negative for pneumothorax. IMPRESSION: 1. Persistent bilateral lung densities that have slightly changed as described. Findings are suggestive for asymmetric pulmonary edema. Infection cannot be excluded. 2. Bilateral pleural effusions. Electronically Signed   By: Markus Daft M.D.   On: 03/20/2020 10:40   US THORACENTESIS ASP PLEURAL SPACE W/IMG GUIDE  Result Date: 03/20/2020 INDICATION: Patient with history of acute hypoxic respiratory failure with bilateral pleural effusions. Request is for therapeutic and diagnostic thoracentesis EXAM: ULTRASOUND GUIDED THERAPEUTIC AND DIAGNOSTIC THORACENTESIS MEDICATIONS: Lidocaine 1% 10 mL COMPLICATIONS: None immediate. PROCEDURE: An ultrasound guided thoracentesis was thoroughly discussed with the patient and questions answered. The benefits, risks, alternatives and complications were also discussed. The patient understands and wishes to proceed with the procedure. Written consent was obtained. Ultrasound was performed to localize and mark an adequate pocket of fluid in the right chest. The area was then prepped and draped in the normal sterile fashion. 1% Lidocaine was used for local anesthesia. Under ultrasound guidance a 6 Fr Safe-T-Centesis catheter was introduced. Thoracentesis was performed. The catheter was removed and a dressing applied. FINDINGS: A total of approximately 450 mL of straw-colored fluid was removed. Samples were sent to the laboratory as requested by the clinical team. IMPRESSION: Successful ultrasound guided therapeutic and diagnostic right-sided thoracentesis yielding 450 mL of pleural fluid. Read by: Rushie Nyhan, NP Electronically Signed   By: Aletta Edouard M.D.   On: 03/20/2020 16:21    Cardiac Studies   Echo 03/17/20  1. Left  ventricular ejection fraction, by estimation, is 50 to 55%. The  left ventricle has low normal function. The left ventricle has no regional  wall motion abnormalities. There is moderate left ventricular hypertrophy.  Left ventricular diastolic  function could not be evaluated.  2. Right ventricular systolic function is low normal. The right  ventricular size is mildly enlarged.  3. Left atrial size was mildly dilated.  4. Right atrial size was moderately dilated.  5. The mitral valve is degenerative. Trivial mitral valve regurgitation.  6. The aortic valve is tricuspid. Aortic valve regurgitation is mild.  Patient Profile     86 y.o. male with history of HFpEF, COPD, PAD, hypertension presenting with shortness of breath and cough, found to have multifocal pneumonia and pulmonary edema.  Hospital course complicated by new onset atrial fibrillation.  Assessment & Plan    1.   Persistent atrial fibrillation, new onset -HR <110 ok.  Pulmonary pathology driving A. fib/heart rates. -Heart rates 99-102 -cont amiodarone 400 bid.  -CHA2DS2-VASc of 5 -Heparin drip.  Okay to transition to oral anticoagulants to decrease fluid intake  2. HFpEF -Patient appears euvolemic -Minimal net negative of 258 cc. - Creatinine 1.7 today.  Hold IV Lasix. -Monitor creatinine -Shortness of breath multifactorial in light of pneumonia and COPD.  3.  PAD -Aspirin -Patient previously self discontinued statin.  4.  Multifocal pneumonia, sepsis, COPD, pleural effusions -On high flow nasal oxygen -IV antibiotics as per ICU team -Inhalers COPD management as per ICU team -Chest x-ray not consistent with CHF picture.??  ARDS.  Management as per ICU team   Total encounter time 35 minutes  Greater than 50% was spent in counseling and coordination of care with the patient     Signed, Aaron Edelman  Ag[place], MD  03/21/2020, 9:12 AM

## 2020-03-21 NOTE — Procedures (Signed)
Ultrasound-guided diagnostic and therapeutic left  thoracentesis performed yielding 550 cc of yellow fluid. No immediate complications. Follow-up chest x-ray pending. A portion of the fluid was sent to the lab for preordered studies. EBL none.

## 2020-03-22 DIAGNOSIS — J90 Pleural effusion, not elsewhere classified: Secondary | ICD-10-CM | POA: Diagnosis not present

## 2020-03-22 DIAGNOSIS — N179 Acute kidney failure, unspecified: Secondary | ICD-10-CM

## 2020-03-22 DIAGNOSIS — D6869 Other thrombophilia: Secondary | ICD-10-CM

## 2020-03-22 DIAGNOSIS — A419 Sepsis, unspecified organism: Secondary | ICD-10-CM | POA: Diagnosis not present

## 2020-03-22 DIAGNOSIS — J9601 Acute respiratory failure with hypoxia: Secondary | ICD-10-CM | POA: Diagnosis not present

## 2020-03-22 DIAGNOSIS — J189 Pneumonia, unspecified organism: Secondary | ICD-10-CM | POA: Diagnosis not present

## 2020-03-22 LAB — BASIC METABOLIC PANEL
Anion gap: 9 (ref 5–15)
BUN: 59 mg/dL — ABNORMAL HIGH (ref 8–23)
CO2: 32 mmol/L (ref 22–32)
Calcium: 8.5 mg/dL — ABNORMAL LOW (ref 8.9–10.3)
Chloride: 93 mmol/L — ABNORMAL LOW (ref 98–111)
Creatinine, Ser: 1.5 mg/dL — ABNORMAL HIGH (ref 0.61–1.24)
GFR, Estimated: 45 mL/min — ABNORMAL LOW (ref >60)
Glucose, Bld: 142 mg/dL — ABNORMAL HIGH (ref 70–99)
Potassium: 3.7 mmol/L (ref 3.5–5.1)
Sodium: 134 mmol/L — ABNORMAL LOW (ref 135–145)

## 2020-03-22 LAB — CBC
HCT: 28.7 % — ABNORMAL LOW (ref 39.0–52.0)
Hemoglobin: 9.2 g/dL — ABNORMAL LOW (ref 13.0–17.0)
MCH: 31.8 pg (ref 26.0–34.0)
MCHC: 32.1 g/dL (ref 30.0–36.0)
MCV: 99.3 fL (ref 80.0–100.0)
Platelets: 183 10*3/uL (ref 150–400)
RBC: 2.89 MIL/uL — ABNORMAL LOW (ref 4.22–5.81)
RDW: 23.9 % — ABNORMAL HIGH (ref 11.5–15.5)
WBC: 15 10*3/uL — ABNORMAL HIGH (ref 4.0–10.5)
nRBC: 1.6 % — ABNORMAL HIGH (ref 0.0–0.2)

## 2020-03-22 LAB — ACID FAST SMEAR (AFB, MYCOBACTERIA): Acid Fast Smear: NEGATIVE

## 2020-03-22 LAB — PH, BODY FLUID: pH, Body Fluid: 7.7

## 2020-03-22 LAB — MAGNESIUM: Magnesium: 2.1 mg/dL (ref 1.7–2.4)

## 2020-03-22 LAB — PHOSPHORUS: Phosphorus: 2.5 mg/dL (ref 2.5–4.6)

## 2020-03-22 LAB — HISTOPLASMA ANTIGEN, URINE: Histoplasma Antigen, urine: <0.5 (ref <0.5)

## 2020-03-22 MED ORDER — POTASSIUM CHLORIDE 20 MEQ PO PACK
40.0000 meq | PACK | Freq: Once | ORAL | Status: AC
  Administered 2020-03-22: 40 meq via ORAL
  Filled 2020-03-22: qty 2

## 2020-03-22 MED ORDER — QUETIAPINE FUMARATE 25 MG PO TABS
25.0000 mg | ORAL_TABLET | Freq: Every day | ORAL | Status: DC
  Administered 2020-03-22 – 2020-03-25 (×4): 25 mg via ORAL
  Filled 2020-03-22 (×4): qty 1

## 2020-03-22 NOTE — Progress Notes (Signed)
PHARMACY CONSULT NOTE  Pharmacy Consult for Electrolyte Monitoring and Replacement   Recent Labs: Potassium (mmol/L)  Date Value  03/22/2020 3.7   Magnesium (mg/dL)  Date Value  03/22/2020 2.1   Calcium (mg/dL)  Date Value  03/22/2020 8.5 (L)   Albumin (g/dL)  Date Value  03/21/2020 2.8 (L)  02/16/2015 3.8   Phosphorus (mg/dL)  Date Value  03/22/2020 2.5   Sodium (mmol/L)  Date Value  03/22/2020 134 (L)  12/29/2019 136    Assessment: 86 year old male with PMHx of arthritis, lumbar degenerative disc disease, glaucoma, HTN, COPD, HFpEF, moderate mitral regurgitation, PAD, recent admission 11/11-11/16 for right fracture of femoral neck s/p right arthroplasty on 11/12 admitted with acute on chronic hypoxic respiratory failure secondary to CHF exacerbation, A-fib with RVR  Diuretics: furosemide 60 mg IV q12h (d/c 11/30 given bump in Scr)  Goal of Therapy:  Potassium 4.0 - 5.1 mmol/L Magnesium 2.0 - 2.4 mg/dL All Other Electrolytes WNL  Plan:   Renal function improved today with diuretics on hold. Will order KCl 40 mEq x 1  Re-check electrolytes in AM and replace as needed  Benita Gutter 03/22/2020 9:24 AM

## 2020-03-22 NOTE — Progress Notes (Signed)
CRITICAL CARE CONSULT      Referring physician: Dr Posey Pronto  CHIEF COMPLAINT:   Acute hypoxemic respiratory failure   SUBJECTIVE FINDINGS & SIGNIFICANT EVENTS   51M from SNF with hx of CHF EF 45, COPD, Right Hip fracture s/p surg coming in for worsening dyspnea and hypoxemia. Per daughter Mechele Claude patient has been declining for appx several months. He was found to have moderate pleural effusions bilaterally and multifocal pneumonia suggestive of CHF with possible overlying infection. BNP was >1200.    03/20/20- Met with daughter, reviewed findings and care plan. Discussed short term plan with cardiology and hospitalist team.   03/21/20- s/p thoracentesis on left with 500cc straw colored fluid.  Patient feels slighly improved was smiling and shook my hand during examination.   12/1-patient improved with weaning of FIO2 , today   PAST MEDICAL HISTORY   Past Medical History:  Diagnosis Date  . (HFpEF) heart failure with improved ejection fraction ([place])    a. 08/2017 Echo: EF 45-50%; b. 10/2019 Echo: EF 55-60%. No rwma, mild LVH, Gr2 DD, RVSP 47.59mHg. Mod dil LA. Mod MR. Asc Ao 478m  . Arthritis   . Benign prostate hyperplasia    lower urinary tract symptoms  . Cataracts, bilateral   . COPD (chronic obstructive pulmonary disease) ([facility] [place]  . DDD (degenerative disc disease), lumbar   . Glaucoma   . H/O carpal tunnel syndrome   . History of osteomyelitis 1953   right leg  . Hypertension   . Moderate mitral regurgitation    a. 10/2019 Echo: Mod MR.  . Marland KitchenAD (peripheral artery disease) ([facility]   a. 08/2017 LE ABI & Duplex: ABI R 0.60, L 1.07. 30-49% R popliteal and 50-74% R PT stenoses.  . Thrombocytopenia ([facility]     SURGICAL HISTORY   Past Surgical History:  Procedure Laterality Date  . CARPAL TUNNEL RELEASE  Left   . CARPAL TUNNEL RELEASE Right   . COLONOSCOPY  2015   Dr. [name]. HIP ARTHROPLASTY Right 03/03/2020   Procedure: ARTHROPLASTY BIPOLAR HIP (HEMIARTHROPLASTY);  Surgeon: MeHessie KnowsMD;  Location: [facility] ORS;  Service: Orthopedics;  Laterality: Right;  . LEG SURGERY Right 1953  . SKIN GRAFT Right 05/01/2017   [facility] TOTAL KNEE ARTHROPLASTY Right      FAMILY HISTORY   Family History  Problem Relation Age of Onset  . Colon cancer Mother 5120. Thrombosis Father 6466. Heart disease Father        Heart valve problem  . AAA (abdominal aortic aneurysm) Father   . Hypertension Sister   . Hyperlipidemia Sister   . Hypertension Sister   . Hyperlipidemia Sister   . Hypertension Sister   . Hyperlipidemia Sister      SOCIAL HISTORY   Social History   Tobacco Use  . Smoking status: Former Smoker    Packs/day: 1.00    Years: 30.00    Pack years: 30.00    Types: Cigarettes    Quit date: 1995    Years since quitting: 26.9  . Smokeless tobacco: Never Used  Vaping Use  . Vaping Use: Never used  Substance Use Topics  . Alcohol use: Not Currently    Comment: occasionally wine   . Drug use: No     MEDICATIONS   Current Medication:  Current Facility-Administered Medications:  .  acetaminophen (TYLENOL) tablet 650 mg, 650 mg, Oral, Q6H PRN, 650 mg at 03/22/20 0405 **OR** acetaminophen (  TYLENOL) suppository 650 mg, 650 mg, Rectal, Q6H PRN, Agbata, Tochukwu, MD .  alum & mag hydroxide-simeth (MAALOX/MYLANTA) 200-200-20 MG/5ML suspension 30 mL, 30 mL, Oral, Q4H PRN, Agbata, Tochukwu, MD .  amiodarone (PACERONE) tablet 400 mg, 400 mg, Oral, BID, Arida, Muhammad A, MD, 400 mg at 03/22/20 1027 .  apixaban (ELIQUIS) tablet 2.5 mg, 2.5 mg, Oral, BID, Lanney Gins, Josias Tomerlin, MD, 2.5 mg at 03/22/20 1032 .  azithromycin (ZITHROMAX) tablet 500 mg, 500 mg, Oral, Daily, Fritzi Mandes, MD, 500 mg at 03/22/20 1032 .  bisacodyl (DULCOLAX) suppository 10 mg, 10 mg, Rectal, Daily PRN,  Agbata, Tochukwu, MD, 10 mg at 03/17/20 1436 .  calcium-vitamin D (OSCAL WITH D) 500-200 MG-UNIT per tablet 1 tablet, 1 tablet, Oral, Daily, Agbata, Tochukwu, MD, 1 tablet at 03/22/20 1028 .  cefTRIAXone (ROCEPHIN) 2 g in sodium chloride 0.9 % 100 mL IVPB, 2 g, Intravenous, Q24H, Fritzi Mandes, MD, Stopped at 03/21/20 1444 .  Chlorhexidine Gluconate Cloth 2 % PADS 6 each, 6 each, Topical, Daily, Fritzi Mandes, MD, 6 each at 03/22/20 1028 .  cholecalciferol (VITAMIN D) tablet 1,000 Units, 1,000 Units, Oral, Daily, Agbata, Tochukwu, MD, 1,000 Units at 03/22/20 1027 .  docusate sodium (COLACE) capsule 100 mg, 100 mg, Oral, BID, Agbata, Tochukwu, MD, 100 mg at 03/22/20 1028 .  feeding supplement (ENSURE ENLIVE / ENSURE PLUS) liquid 237 mL, 237 mL, Oral, TID BM, Fritzi Mandes, MD, 237 mL at 03/22/20 1029 .  finasteride (PROSCAR) tablet 5 mg, 5 mg, Oral, Daily, Agbata, Tochukwu, MD, 5 mg at 03/22/20 1029 .  fluticasone (FLONASE) 50 MCG/ACT nasal spray 2 spray, 2 spray, Each Nare, Daily PRN, Agbata, Tochukwu, MD .  methocarbamol (ROBAXIN) tablet 500 mg, 500 mg, Oral, Q6H PRN, Agbata, Tochukwu, MD, 500 mg at 03/21/20 0314 .  multivitamin with minerals tablet 1 tablet, 1 tablet, Oral, Daily, Fritzi Mandes, MD, 1 tablet at 03/22/20 1028 .  ondansetron (ZOFRAN) tablet 4 mg, 4 mg, Oral, Q6H PRN **OR** ondansetron (ZOFRAN) injection 4 mg, 4 mg, Intravenous, Q6H PRN, Agbata, Tochukwu, MD, 4 mg at 03/20/20 0742 .  pantoprazole (PROTONIX) EC tablet 40 mg, 40 mg, Oral, Daily, Agbata, Tochukwu, MD, 40 mg at 03/22/20 1028 .  polyethylene glycol (MIRALAX / GLYCOLAX) packet 17 g, 17 g, Oral, QHS, Agbata, Tochukwu, MD, 17 g at 03/21/20 2144 .  polyvinyl alcohol (LIQUIFILM TEARS) 1.4 % ophthalmic solution 1 drop, 1 drop, Both Eyes, PRN, Agbata, Tochukwu, MD .  QUEtiapine (SEROQUEL) tablet 25 mg, 25 mg, Oral, QHS, Ouma, Elizabeth Achieng, NP .  timolol (TIMOPTIC) 0.5 % ophthalmic solution 1 drop, 1 drop, Both Eyes, BID, Agbata,  Tochukwu, MD, 1 drop at 03/22/20 1029 .  traZODone (DESYREL) tablet 100 mg, 100 mg, Oral, QHS, Agbata, Tochukwu, MD, 100 mg at 03/21/20 2144    ALLERGIES   Patient has no known allergies.    REVIEW OF SYSTEMS     10 point ROS done and is negative except for dyspnea  PHYSICAL EXAMINATION   Vitals:   03/22/20 0600 03/22/20 0801  BP: (!) 107/55   Pulse: 97   Resp: 19   Temp:    SpO2: 95% 93%    GENERAL:age appropirate HEAD: Normocephalic, atraumatic.  EYES: Pupils equal, round, reactive to light.  No scleral icterus.  MOUTH: Moist mucosal membrane. NECK: Supple. No thyromegaly. No nodules. No JVD.  PULMONARY: bilateral rhonchi CARDIOVASCULAR: S1 and S2. Regular rate and rhythm. No murmurs, rubs, or gallops.  GASTROINTESTINAL: Soft, nontender, non-distended. No masses. Positive bowel  sounds. No hepatosplenomegaly.  MUSCULOSKELETAL: No swelling, clubbing, or edema.  NEUROLOGIC: Mild distress due to acute illness SKIN:intact,warm,dry   PERTINENT DATA      Lines / Drains: PIV x 2  Cultures / Sepsis markers: -Blood cultures -negative x 4d -COIVID -negative -FLUa&b - negative -RSV- negative  Antibiotics: Rocephin and Zithromax - started 11/29   Protocols / Consultants: Cardio, pccm, hospitalist, palliative  Tests / Events: Thoracentesis - 11/29 US abdomen -11/29 -RVP-11/29 -respiratory culture -11/29     Infusions: . cefTRIAXone (ROCEPHIN)  IV Stopped (03/21/20 1444)   Scheduled Medications: . amiodarone  400 mg Oral BID  . apixaban  2.5 mg Oral BID  . azithromycin  500 mg Oral Daily  . calcium-vitamin D  1 tablet Oral Daily  . Chlorhexidine Gluconate Cloth  6 each Topical Daily  . cholecalciferol  1,000 Units Oral Daily  . docusate sodium  100 mg Oral BID  . feeding supplement  237 mL Oral TID BM  . finasteride  5 mg Oral Daily  . multivitamin with minerals  1 tablet Oral Daily  . pantoprazole  40 mg Oral Daily  . polyethylene glycol  17 g  Oral QHS  . QUEtiapine  25 mg Oral QHS  . timolol  1 drop Both Eyes BID  . traZODone  100 mg Oral QHS   PRN Medications: acetaminophen **OR** acetaminophen, alum & mag hydroxide-simeth, bisacodyl, fluticasone, methocarbamol, ondansetron **OR** ondansetron (ZOFRAN) IV, polyvinyl alcohol Hemodynamic parameters:   Intake/Output: 11/30 0701 - 12/01 0700 In: 807.9 [P.O.:540; I.V.:67.9; IV Piggyback:200] Out: 900 [Urine:900]  Ventilator  Settings: FiO2 (%):  [65 %-67 %] 65 %   Other Labs:    LAB RESULTS:  Basic Metabolic Panel: Recent Labs  Lab 03/17/20 0624 03/17/20 9702 03/18/20 0205 03/18/20 0205 03/19/20 6378 03/19/20 0512 03/20/20 0531 03/20/20 0531 03/21/20 0533 03/22/20 0612  NA 135  --  134*  --  134*  --   --   --  134* 134*  K 4.1   < > 3.0*   < > 3.1*   < > 3.7   < > 4.1 3.7  CL 94*  --  93*  --  93*  --   --   --  93* 93*  CO2 27  --  28  --  28  --   --   --  29 32  GLUCOSE 111*  --  140*  --  128*  --   --   --  121* 142*  BUN 24*  --  34*  --  45*  --   --   --  60* 59*  CREATININE 0.88  --  1.12  --  1.17  --   --   --  1.70* 1.50*  CALCIUM 9.1  --  8.9  --  8.6*  --   --   --  8.6* 8.5*  MG  --   --   --   --   --   --  2.0  --  2.0 2.1  PHOS  --   --   --   --   --   --   --   --  2.7 2.5   < > = values in this interval not displayed.   Liver Function Tests: Recent Labs  Lab 03/10/2020 0920 03/20/20 0716 03/21/20 0533  AST 57* 48*  --   ALT 35 34  --   ALKPHOS 88 93  --   BILITOT 3.2*  2.0*  --   PROT 6.3* 5.7*  --   ALBUMIN 3.1* 2.7* 2.8*   No results for input(s): LIPASE, AMYLASE in the last 168 hours. No results for input(s): AMMONIA in the last 168 hours. CBC: Recent Labs  Lab 02/27/2020 0753 03/17/20 0624 03/18/20 0205 03/19/20 0512 03/20/20 0531 03/21/20 0533 03/22/20 0612  WBC 15.8*   < > 13.9* 15.8* 12.9* 17.4* 15.0*  NEUTROABS 13.6*  --   --   --   --   --   --   HGB 10.4*   < > 9.9* 9.6* 8.9*  8.9* 9.4* 9.2*  HCT 33.0*   < >  30.7* 29.0* 29.1* 30.0* 28.7*  MCV 100.3*   < > 98.4 98.0 101.4* 100.0 99.3  PLT 275   < > 247 225 209 188 183   < > = values in this interval not displayed.   Cardiac Enzymes: No results for input(s): CKTOTAL, CKMB, CKMBINDEX, TROPONINI in the last 168 hours. BNP: Invalid input(s): POCBNP CBG: Recent Labs  Lab 03/02/2020 1202  GLUCAP 116*     IMAGING RESULTS:  Imaging: DG Chest Port 1 View  Result Date: 03/21/2020 CLINICAL DATA:  Post LEFT thoracentesis EXAM: PORTABLE CHEST 1 VIEW COMPARISON:  Portable exam 1155 hours compared to 0855 hours FINDINGS: Enlargement of cardiac silhouette. Diffuse BILATERAL pulmonary infiltrates. Decreased LEFT pleural effusion post thoracentesis. No pneumothorax or acute osseous findings. IMPRESSION: No pneumothorax following LEFT thoracentesis. Persistent diffuse BILATERAL pulmonary infiltrates Electronically Signed   By: Lavonia Dana M.D.   On: 03/21/2020 12:10   DG Chest Port 1 View  Result Date: 03/21/2020 CLINICAL DATA:  Acute on chronic hypoxic respiratory failure, CHF EXAM: PORTABLE CHEST 1 VIEW COMPARISON:  Chest radiograph from one day prior. FINDINGS: Stable cardiomediastinal silhouette with mild cardiomegaly. No pneumothorax. Small bilateral pleural effusions, left greater than right, mildly increased. Diffuse patchy hazy opacities throughout both lungs, slightly worsened. IMPRESSION: 1. Mild cardiomegaly. 2. Small bilateral pleural effusions, left greater than right, mildly increased. 3. Diffuse patchy hazy opacities throughout both lungs, slightly worsened, differential includes cardiogenic pulmonary edema, multifocal pneumonia or ARDS. Electronically Signed   By: Ilona Sorrel M.D.   On: 03/21/2020 09:04   DG Chest Port 1 View  Result Date: 03/20/2020 CLINICAL DATA:  Post thoracentesis EXAM: PORTABLE CHEST 1 VIEW COMPARISON:  Radiograph 03/20/2020 FINDINGS: Interval decrease in the size of a right pleural effusion post thoracentesis. No large  pneumothorax. Persistent left effusion and heterogeneous airspace opacities bilaterally, not significantly changed from comparison. Cardiomediastinal contours are partially obscured by opacity albeit with stable cardiomegaly and a calcified aorta. No acute osseous or soft tissue abnormality. Degenerative changes are present in the imaged spine and shoulders. Telemetry leads overlie the chest. IMPRESSION: 1. Interval decrease in size of a right pleural effusion post thoracentesis. No large pneumothorax. 2. Otherwise unchanged left effusion and bilateral heterogeneous opacities possibly asymmetric edema and/or infection. Electronically Signed   By: Lovena Le M.D.   On: 03/20/2020 16:55   US Abdomen Limited RUQ (LIVER/GB)  Result Date: 03/21/2020 CLINICAL DATA:  Hyperbilirubinemia. EXAM: ULTRASOUND ABDOMEN LIMITED RIGHT UPPER QUADRANT COMPARISON:  CT abdomen pelvis 03/08/2020 FINDINGS: Gallbladder: Dense shadowing throughout the gallbladder compatible with calcification. There is peripheral calcification in the gallbladder fundus on the prior CT. This is presumably due to calcified gallstones although gallbladder wall calcification could have a similar appearance. Negative sonographic Murphy sign. Common bile duct: Diameter: 2.5 mm Liver: Increased echogenicity liver diffusely without focal liver lesion. Portal  vein is patent on color Doppler imaging with normal direction of blood flow towards the liver. Other: None. IMPRESSION: Extensive shadowing due to gallbladder calcification. This is presumably a large gallstone although gallbladder wall calcification could also be present. This limits evaluation of the gallbladder lumen. No biliary dilatation Hyperechoic liver suggesting fatty infiltration. Electronically Signed   By: Franchot Gallo M.D.   On: 03/21/2020 09:26   US THORACENTESIS ASP PLEURAL SPACE W/IMG GUIDE  Result Date: 03/21/2020 INDICATION: Patient with history of CHF, COPD, respiratory failure  ,atrial fibrillation, bilateral pleural effusions; status post right thoracentesis on 03/20/20; request now received for diagnostic and therapeutic left thoracentesis. EXAM: ULTRASOUND GUIDED DIAGNOSTIC THERAPEUTIC LEFT THORACENTESIS MEDICATIONS: 1% lidocaine to skin and subcutaneous tissue COMPLICATIONS: None immediate. PROCEDURE: An ultrasound guided thoracentesis was thoroughly discussed with the patient and questions answered. The benefits, risks, alternatives and complications were also discussed. The patient understands and wishes to proceed with the procedure. Written consent was obtained. Ultrasound was performed to localize and mark an adequate pocket of fluid in the left chest. The area was then prepped and draped in the normal sterile fashion. 1% Lidocaine was used for local anesthesia. Under ultrasound guidance a 6 Fr Safe-T-Centesis catheter was introduced. Thoracentesis was performed. The catheter was removed and a dressing applied. FINDINGS: A total of approximately 550 cc of yellow fluid was removed. Samples were sent to the laboratory as requested by the clinical team. IMPRESSION: Successful ultrasound guided diagnostic and therapeutic left thoracentesis yielding 550 cc of pleural fluid. Read by: Rowe Robert, PA-C Electronically Signed   By: Markus Daft M.D.   On: 03/21/2020 11:52   US THORACENTESIS ASP PLEURAL SPACE W/IMG GUIDE  Result Date: 03/20/2020 INDICATION: Patient with history of acute hypoxic respiratory failure with bilateral pleural effusions. Request is for therapeutic and diagnostic thoracentesis EXAM: ULTRASOUND GUIDED THERAPEUTIC AND DIAGNOSTIC THORACENTESIS MEDICATIONS: Lidocaine 1% 10 mL COMPLICATIONS: None immediate. PROCEDURE: An ultrasound guided thoracentesis was thoroughly discussed with the patient and questions answered. The benefits, risks, alternatives and complications were also discussed. The patient understands and wishes to proceed with the procedure. Written  consent was obtained. Ultrasound was performed to localize and mark an adequate pocket of fluid in the right chest. The area was then prepped and draped in the normal sterile fashion. 1% Lidocaine was used for local anesthesia. Under ultrasound guidance a 6 Fr Safe-T-Centesis catheter was introduced. Thoracentesis was performed. The catheter was removed and a dressing applied. FINDINGS: A total of approximately 450 mL of straw-colored fluid was removed. Samples were sent to the laboratory as requested by the clinical team. IMPRESSION: Successful ultrasound guided therapeutic and diagnostic right-sided thoracentesis yielding 450 mL of pleural fluid. Read by: Rushie Nyhan, NP Electronically Signed   By: Aletta Edouard M.D.   On: 03/20/2020 16:21      ASSESSMENT AND PLAN    -Multidisciplinary rounds held today  Acute Hypoxic Respiratory Failure -due to acute decompensated systolic CHF with EF <85% -complicated by pneumonia of yet unclear etiology, bilateral atelectasis and bilateral pleural effusions -holding diuretic for now due to AKI -thoracentesis - IR consult -s/p right pleural space tap 11/29 and left thoracentesis on 11/30 -RVP -resp culture -empiric CAP regimen -RHC plan per cardiology - appreciate input   New onset Atrial fibrillation   -TSH is normal  - possible due to infection/sepsis vs worsening heart failure  -on heparin gtt  - on amio and digoxin  -cardiology on case - appreciate input   Chronic COPD  -  no phelgm production on examination  - no signs of acute exacerbation on exam    Jaundice   - daughter at bedside also confirms that patient appears orange compared to his normal. Will continue to monitor LFTs and obtain US Liver    ID -continue IV abx as prescibed -follow up cultures  GI/Nutrition GI PROPHYLAXIS as indicated DIET-->TF's as tolerated Constipation protocol as indicated  ENDO - ICU hypoglycemic\Hyperglycemia protocol -check FSBS per  protocol   ELECTROLYTES -follow labs as needed -replace as needed -pharmacy consultation   DVT/GI PRX ordered -SCDs  TRANSFUSIONS AS NEEDED MONITOR FSBS ASSESS the need for LABS as needed   Critical care provider statement:    Critical care time (minutes):  33   Critical care time was exclusive of:  Separately billable procedures and treating other patients   Critical care was necessary to treat or prevent imminent or life-threatening deterioration of the following conditions:  acute hypoxemic respiratory failure, acute decompensated CHF, new onset Atrial fibrillation, multple comorbid conditions   Critical care was time spent personally by me on the following activities:  Development of treatment plan with patient or surrogate, discussions with consultants, evaluation of patient's response to treatment, examination of patient, obtaining history from patient or surrogate, ordering and performing treatments and interventions, ordering and review of laboratory studies and re-evaluation of patient's condition.  I assumed direction of critical care for this patient from another provider in my specialty: no    This document was prepared using Dragon voice recognition software and may include unintentional dictation errors.    Ottie Glazier, M.D.  Division of [place]

## 2020-03-22 NOTE — Progress Notes (Signed)
Patient ID: Raymond Barnett, male   DOB: 04-22-1930, 86 y.o.   MRN: [id number] [facility] PROGRESS NOTE  Raymond Barnett MLY:[id number] DOB: [birth year] DOA: 02/25/2020 PCP: [name], DO  HPI/Subjective: Patient sometimes feels good other times not so good.  Breathing better since he had thoracentesis yesterday.  Still on heated high flow nasal cannula 55 L and 64% FiO2.  Admitted 11/25 with shortness of breath.  Objective: Vitals:   03/22/20 1300 03/22/20 1400  BP: 105/64 94/64  Pulse: 99 95  Resp: (!) 29 (!) 28  Temp:    SpO2: 91% 90%    Intake/Output Summary (Last 24 hours) at 03/22/2020 1546 Last data filed at 03/22/2020 0600 Gross per 24 hour  Intake 100 ml  Output 900 ml  Net -800 ml   Filed Weights   03/11/2020 0735  Weight: 93 kg    ROS: Review of Systems  Respiratory: Positive for cough and shortness of breath.   Cardiovascular: Negative for chest pain.  Gastrointestinal: Negative for abdominal pain, nausea and vomiting.   Exam: Physical Exam HENT:     Head: Normocephalic.     Mouth/Throat:     Pharynx: No oropharyngeal exudate.  Eyes:     General: Lids are normal.     Conjunctiva/sclera: Conjunctivae normal.     Pupils: Pupils are equal, round, and reactive to light.  Cardiovascular:     Rate and Rhythm: Normal rate and regular rhythm.     Heart sounds: Normal heart sounds, S1 normal and S2 normal.  Pulmonary:     Breath sounds: Examination of the right-lower field reveals decreased breath sounds and rhonchi. Examination of the left-lower field reveals decreased breath sounds and rhonchi. Decreased breath sounds and rhonchi present. No wheezing or rales.  Abdominal:     Palpations: Abdomen is soft.     Tenderness: There is no abdominal tenderness.  Musculoskeletal:     Right ankle: Swelling present.     Left ankle: Swelling present.  Skin:    General: Skin is warm.     Findings: No rash.  Neurological:     Mental Status: He is alert  and oriented to person, place, and time.       Data Reviewed: Basic Metabolic Panel: Recent Labs  Lab 03/17/20 0624 03/17/20 8127 03/18/20 0205 03/19/20 0512 03/20/20 0531 03/21/20 0533 03/22/20 0612  NA 135  --  134* 134*  --  134* 134*  K 4.1   < > 3.0* 3.1* 3.7 4.1 3.7  CL 94*  --  93* 93*  --  93* 93*  CO2 27  --  28 28  --  29 32  GLUCOSE 111*  --  140* 128*  --  121* 142*  BUN 24*  --  34* 45*  --  60* 59*  CREATININE 0.88  --  1.12 1.17  --  1.70* 1.50*  CALCIUM 9.1  --  8.9 8.6*  --  8.6* 8.5*  MG  --   --   --   --  2.0 2.0 2.1  PHOS  --   --   --   --   --  2.7 2.5   < > = values in this interval not displayed.   Liver Function Tests: Recent Labs  Lab 02/29/2020 0920 03/20/20 0716 03/21/20 0533  AST 57* 48*  --   ALT 35 34  --   ALKPHOS 88 93  --   BILITOT 3.2* 2.0*  --  PROT 6.3* 5.7*  --   ALBUMIN 3.1* 2.7* 2.8*   CBC: Recent Labs  Lab 03/08/2020 0753 03/17/20 0624 03/18/20 0205 03/19/20 0512 03/20/20 0531 03/21/20 0533 03/22/20 0612  WBC 15.8*   < > 13.9* 15.8* 12.9* 17.4* 15.0*  NEUTROABS 13.6*  --   --   --   --   --   --   HGB 10.4*   < > 9.9* 9.6* 8.9*  8.9* 9.4* 9.2*  HCT 33.0*   < > 30.7* 29.0* 29.1* 30.0* 28.7*  MCV 100.3*   < > 98.4 98.0 101.4* 100.0 99.3  PLT 275   < > 247 225 209 188 183   < > = values in this interval not displayed.   BNP (last 3 results) Recent Labs    03/03/20 0020 03/04/20 0400 03/03/2020 0753  BNP 540.9* 409.8* 1,276.9*    CBG: Recent Labs  Lab 02/29/2020 1202  GLUCAP 116*    Recent Results (from the past 240 hour(s))  Blood culture (routine x 2)     Status: None   Collection Time: 02/29/2020  7:53 AM   Specimen: BLOOD  Result Value Ref Range Status   Specimen Description BLOOD RIGHT HAND  Final   Special Requests   Final    BOTTLES DRAWN AEROBIC AND ANAEROBIC Blood Culture adequate volume   Culture   Final    NO GROWTH 5 DAYS Performed at [facility], [address]  [place], [place] [address]    Report Status 03/21/2020 FINAL  Final  Resp Panel by RT-PCR (Flu A&B, Covid) Nasopharyngeal Swab     Status: None   Collection Time: 03/11/2020  7:58 AM   Specimen: Nasopharyngeal Swab; Nasopharyngeal(NP) swabs in vial transport medium  Result Value Ref Range Status   SARS Coronavirus 2 by RT PCR NEGATIVE NEGATIVE Final    Comment: (NOTE) SARS-CoV-2 target nucleic acids are NOT DETECTED.  The SARS-CoV-2 RNA is generally detectable in upper respiratory specimens during the acute phase of infection. The lowest concentration of SARS-CoV-2 viral copies this assay can detect is 138 copies/mL. A negative result does not preclude SARS-Cov-2 infection and should not be used as the sole basis for treatment or other patient management decisions. A negative result may occur with  improper specimen collection/handling, submission of specimen other than nasopharyngeal swab, presence of viral mutation(s) within the areas targeted by this assay, and inadequate number of viral copies(<138 copies/mL). A negative result must be combined with clinical observations, patient history, and epidemiological information. The expected result is Negative.  Fact Sheet for Patients:  EntrepreneurPulse.com.au  Fact Sheet for Healthcare Providers:  IncredibleEmployment.be  This test is no t yet approved or cleared by the [place] FDA and  has been authorized for detection and/or diagnosis of SARS-CoV-2 by FDA under an Emergency Use Authorization (EUA). This EUA will remain  in effect (meaning this test can be used) for the duration of the COVID-19 declaration under Section 564(b)(1) of the Act, 21 U.S.C.section 360bbb-3(b)(1), unless the authorization is terminated  or revoked sooner.       Influenza A by PCR NEGATIVE NEGATIVE Final   Influenza B by PCR NEGATIVE NEGATIVE Final    Comment: (NOTE) The Xpert Xpress SARS-CoV-2/FLU/RSV plus assay is  intended as an aid in the diagnosis of influenza from Nasopharyngeal swab specimens and should not be used as a sole basis for treatment. Nasal washings and aspirates are unacceptable for Xpert Xpress SARS-CoV-2/FLU/RSV testing.  Fact Sheet for Patients: EntrepreneurPulse.com.au  Fact Sheet for Healthcare Providers: IncredibleEmployment.be  This test is not yet approved or cleared by the [place] FDA and has been authorized for detection and/or diagnosis of SARS-CoV-2 by FDA under an Emergency Use Authorization (EUA). This EUA will remain in effect (meaning this test can be used) for the duration of the COVID-19 declaration under Section 564(b)(1) of the Act, 21 U.S.C. section 360bbb-3(b)(1), unless the authorization is terminated or revoked.  Performed at [facility], [address] [place], [place] [address]   Blood culture (routine x 2)     Status: None   Collection Time: 03/12/2020  7:58 AM   Specimen: BLOOD  Result Value Ref Range Status   Specimen Description BLOOD LEFT AC  Final   Special Requests   Final    BOTTLES DRAWN AEROBIC AND ANAEROBIC Blood Culture results may not be optimal due to an inadequate volume of blood received in culture bottles   Culture   Final    NO GROWTH 5 DAYS Performed at [facility], [address] [place], [place] [address]    Report Status 03/21/2020 FINAL  Final  MRSA PCR Screening     Status: None   Collection Time: 03/17/20  9:51 AM   Specimen: Nasal Mucosa; Nasopharyngeal  Result Value Ref Range Status   MRSA by PCR NEGATIVE NEGATIVE Final    Comment:        The GeneXpert MRSA Assay (FDA approved for NASAL specimens only), is one component of a comprehensive MRSA colonization surveillance program. It is not intended to diagnose MRSA infection nor to guide or monitor treatment for MRSA infections. Performed at [facility], [address] [place],  [place] [address]   Expectorated sputum assessment w rflx to resp cult     Status: None   Collection Time: 03/20/20  2:56 PM   Specimen: Expectorated Sputum  Result Value Ref Range Status   Specimen Description EXPECTORATED SPUTUM  Final   Special Requests NONE  Final   Sputum evaluation   Final    THIS SPECIMEN IS ACCEPTABLE FOR SPUTUM CULTURE Performed at [facility], [address], [place], [place] [address]    Report Status 03/20/2020 FINAL  Final  Culture, respiratory     Status: None (Preliminary result)   Collection Time: 03/20/20  2:56 PM  Result Value Ref Range Status   Specimen Description   Final    EXPECTORATED SPUTUM Performed at [facility], [address] [place], [place] [address]    Special Requests   Final    NONE Reflexed from [id number] Performed at  [facility], [address] [place], [place] [address]    Gram Stain   Final    NO WBC SEEN FEW SQUAMOUS EPITHELIAL CELLS PRESENT ABUNDANT GRAM POSITIVE COCCI FEW GRAM NEGATIVE RODS FEW GRAM POSITIVE RODS    Culture   Final    CULTURE REINCUBATED FOR BETTER GROWTH Performed at [facility], [place] [address], [place], [place] [address]    Report Status PENDING  Incomplete  Body fluid culture     Status: None (Preliminary result)   Collection Time: 03/20/20  4:00 PM   Specimen: PATH Cytology Pleural fluid  Result Value Ref Range Status   Specimen Description   Final    PLEURAL Performed at [facility], [address], [place], [place] [address]    Special Requests   Final    NONE Performed at [facility], [address] [place], [place] [address]    Gram Stain   Final    RARE WBC PRESENT,BOTH PMN AND MONONUCLEAR NO ORGANISMS SEEN    Culture   Final    NO GROWTH 2 DAYS Performed at [facility], [place] [address], [place], [place] [address]    Report Status PENDING  Incomplete  Body fluid culture     Status: None (Preliminary result)    Collection Time: 03/21/20 11:49 AM   Specimen: PATH Cytology Pleural fluid  Result Value Ref Range Status   Specimen Description   Final    PLEURAL Performed at [facility], [address], [place], [place] [address]    Special Requests   Final    NONE Performed at [facility], [address] [place], [place] [address]    Gram Stain   Final    RARE WBC PRESENT, PREDOMINANTLY MONONUCLEAR NO ORGANISMS SEEN    Culture   Final    NO GROWTH < 24 HOURS Performed at [facility], [place] [address], [place], [place] [address]    Report Status PENDING  Incomplete     Studies: DG Chest Port 1 View  Result Date: 03/21/2020 CLINICAL DATA:  Post LEFT thoracentesis EXAM: PORTABLE CHEST 1 VIEW COMPARISON:  Portable exam 1155 hours compared to 0855 hours FINDINGS: Enlargement of cardiac silhouette. Diffuse BILATERAL pulmonary infiltrates. Decreased LEFT pleural effusion post thoracentesis. No pneumothorax or acute osseous findings. IMPRESSION: No pneumothorax following LEFT thoracentesis. Persistent diffuse BILATERAL pulmonary infiltrates Electronically Signed   By: Lavonia Dana M.D.   On: 03/21/2020 12:10   DG Chest Port 1 View  Result Date: 03/21/2020 CLINICAL DATA:  Acute on chronic hypoxic respiratory failure, CHF EXAM: PORTABLE CHEST 1 VIEW COMPARISON:  Chest radiograph from one day prior. FINDINGS: Stable cardiomediastinal silhouette with mild cardiomegaly. No pneumothorax. Small bilateral pleural effusions, left greater than right, mildly increased. Diffuse patchy hazy opacities throughout both lungs, slightly worsened. IMPRESSION: 1. Mild cardiomegaly. 2. Small bilateral pleural effusions, left greater than right, mildly increased. 3. Diffuse patchy hazy opacities throughout both lungs, slightly worsened, differential includes cardiogenic pulmonary edema, multifocal pneumonia or ARDS. Electronically Signed   By: Ilona Sorrel M.D.   On: 03/21/2020 09:04   DG Chest  Port 1 View  Result Date: 03/20/2020 CLINICAL DATA:  Post thoracentesis EXAM: PORTABLE CHEST 1 VIEW COMPARISON:  Radiograph 03/20/2020 FINDINGS: Interval decrease in the size of a right pleural effusion post thoracentesis. No large pneumothorax. Persistent left effusion and heterogeneous airspace opacities bilaterally, not significantly changed from comparison. Cardiomediastinal contours are partially obscured by opacity albeit with stable cardiomegaly and a calcified aorta. No acute osseous or soft tissue abnormality. Degenerative changes are present in the imaged spine and shoulders. Telemetry leads overlie the chest. IMPRESSION: 1. Interval decrease in size of a right pleural effusion post thoracentesis. No large pneumothorax. 2. Otherwise unchanged left effusion and bilateral heterogeneous opacities possibly asymmetric edema and/or infection. Electronically Signed   By: Lovena Le M.D.   On: 03/20/2020 16:55   US Abdomen Limited RUQ (LIVER/GB)  Result Date: 03/21/2020 CLINICAL DATA:  Hyperbilirubinemia. EXAM: ULTRASOUND ABDOMEN LIMITED RIGHT UPPER QUADRANT COMPARISON:  CT abdomen pelvis 02/26/2020 FINDINGS: Gallbladder: Dense shadowing throughout the gallbladder compatible with calcification. There is peripheral calcification in the gallbladder fundus on the prior CT. This is presumably due to calcified gallstones although gallbladder wall calcification could have a similar appearance. Negative sonographic Murphy sign. Common bile duct: Diameter: 2.5 mm Liver: Increased echogenicity  liver diffusely without focal liver lesion. Portal vein is patent on color Doppler imaging with normal direction of blood flow towards the liver. Other: None. IMPRESSION: Extensive shadowing due to gallbladder calcification. This is presumably a large gallstone although gallbladder wall calcification could also be present. This limits evaluation of the gallbladder lumen. No biliary dilatation Hyperechoic liver suggesting  fatty infiltration. Electronically Signed   By: Franchot Gallo M.D.   On: 03/21/2020 09:26   US THORACENTESIS ASP PLEURAL SPACE W/IMG GUIDE  Result Date: 03/21/2020 INDICATION: Patient with history of CHF, COPD, respiratory failure ,atrial fibrillation, bilateral pleural effusions; status post right thoracentesis on 03/20/20; request now received for diagnostic and therapeutic left thoracentesis. EXAM: ULTRASOUND GUIDED DIAGNOSTIC THERAPEUTIC LEFT THORACENTESIS MEDICATIONS: 1% lidocaine to skin and subcutaneous tissue COMPLICATIONS: None immediate. PROCEDURE: An ultrasound guided thoracentesis was thoroughly discussed with the patient and questions answered. The benefits, risks, alternatives and complications were also discussed. The patient understands and wishes to proceed with the procedure. Written consent was obtained. Ultrasound was performed to localize and mark an adequate pocket of fluid in the left chest. The area was then prepped and draped in the normal sterile fashion. 1% Lidocaine was used for local anesthesia. Under ultrasound guidance a 6 Fr Safe-T-Centesis catheter was introduced. Thoracentesis was performed. The catheter was removed and a dressing applied. FINDINGS: A total of approximately 550 cc of yellow fluid was removed. Samples were sent to the laboratory as requested by the clinical team. IMPRESSION: Successful ultrasound guided diagnostic and therapeutic left thoracentesis yielding 550 cc of pleural fluid. Read by: Rowe Robert, PA-C Electronically Signed   By: Markus Daft M.D.   On: 03/21/2020 11:52   US THORACENTESIS ASP PLEURAL SPACE W/IMG GUIDE  Result Date: 03/20/2020 INDICATION: Patient with history of acute hypoxic respiratory failure with bilateral pleural effusions. Request is for therapeutic and diagnostic thoracentesis EXAM: ULTRASOUND GUIDED THERAPEUTIC AND DIAGNOSTIC THORACENTESIS MEDICATIONS: Lidocaine 1% 10 mL COMPLICATIONS: None immediate. PROCEDURE: An ultrasound  guided thoracentesis was thoroughly discussed with the patient and questions answered. The benefits, risks, alternatives and complications were also discussed. The patient understands and wishes to proceed with the procedure. Written consent was obtained. Ultrasound was performed to localize and mark an adequate pocket of fluid in the right chest. The area was then prepped and draped in the normal sterile fashion. 1% Lidocaine was used for local anesthesia. Under ultrasound guidance a 6 Fr Safe-T-Centesis catheter was introduced. Thoracentesis was performed. The catheter was removed and a dressing applied. FINDINGS: A total of approximately 450 mL of straw-colored fluid was removed. Samples were sent to the laboratory as requested by the clinical team. IMPRESSION: Successful ultrasound guided therapeutic and diagnostic right-sided thoracentesis yielding 450 mL of pleural fluid. Read by: Rushie Nyhan, NP Electronically Signed   By: Aletta Edouard M.D.   On: 03/20/2020 16:21    Scheduled Meds: . amiodarone  400 mg Oral BID  . apixaban  2.5 mg Oral BID  . azithromycin  500 mg Oral Daily  . calcium-vitamin D  1 tablet Oral Daily  . Chlorhexidine Gluconate Cloth  6 each Topical Daily  . cholecalciferol  1,000 Units Oral Daily  . docusate sodium  100 mg Oral BID  . feeding supplement  237 mL Oral TID BM  . finasteride  5 mg Oral Daily  . multivitamin with minerals  1 tablet Oral Daily  . pantoprazole  40 mg Oral Daily  . polyethylene glycol  17 g Oral QHS  . QUEtiapine  25 mg Oral QHS  . timolol  1 drop Both Eyes BID  . traZODone  100 mg Oral QHS   Continuous Infusions: . cefTRIAXone (ROCEPHIN)  IV Stopped (03/21/20 1444)    Assessment/Plan:  1. Acute hypoxic respiratory failure due to CHF, pneumonia and pleural effusions.  The patient had a room air saturation of 82% (<90%) on presentation.  Currently patient on heated high flow nasal cannula 55 L and 64% FiO2. 2. Severe sepsis, present  on admission with multifocal pneumonia on Rocephin and Zithromax. 3. Bilateral pleural effusions status post bilateral thoracentesis. 4. Acute on chronic diastolic congestive heart failure.  Holding IV Lasix at this point and watching volume status 5. Persistent atrial fibrillation on Eliquis and amiodarone 6. Acquired thrombophilia secondary to atrial fibrillation.  Eliquis to reduce stroke risk. 7. Acute renal failure.  Creatinine increased by .53 (>.3mg /dl within 48hours) from 11/28 to 11/30.  From a creatinine of 1.17 to 1.7.  Creatinine better at 1.5 today.  Continue to hold diuresis.  Check creatinine again tomorrow or increase in creatinine. 8. Weakness.  Physical therapy evaluation      Code Status:     Code Status Orders  (From admission, onward)         Start     Ordered   03/04/2020 1026  Do not attempt resuscitation (DNR)  Continuous       Question Answer Comment  In the event of cardiac or respiratory ARREST Do not call a "code blue"   In the event of cardiac or respiratory ARREST Do not perform Intubation, CPR, defibrillation or ACLS   In the event of cardiac or respiratory ARREST Use medication by any route, position, wound care, and other measures to relive pain and suffering. May use oxygen, suction and manual treatment of airway obstruction as needed for comfort.      02/23/2020 1029        Code Status History    Date Active Date Inactive Code Status Order ID Comments User Context   03/03/2020 0037 03/07/2020 2337 DNR [id number]  Athena Masse, MD ED   03/02/2020 2336 03/03/2020 0037 Full Code [id number]  Athena Masse, MD ED   Advance Care Planning Activity    Advance Directive Documentation     Most Recent Value  Type of Advance Directive Out of facility DNR (pink MOST or yellow form)  Pre-existing out of facility DNR order (yellow form or pink MOST form) Yellow form placed in chart (order not valid for inpatient use)  "MOST" Form in Place? --     Family  Communication: Left message for the patient's wife on the phone Disposition Plan: Status is: Inpatient  Dispo: The patient is from: Home              Anticipated d/c is to: Rehab              Anticipated d/c date is: Unable to anticipate because he is on way too much oxygen at this point              Patient currently being treated for pneumonia, acute hypoxic respiratory failure, bilateral pleural effusions and CHF.  Consultants:  Critical care specialist  Cardiology  Procedures:  Bilateral thoracentesis  Antibiotics:  Rocephin and Zithromax  Time spent: 28 minutes  [place]

## 2020-03-22 NOTE — Progress Notes (Signed)
Progress Note  Patient Name: Raymond Barnett Date of Encounter: 03/22/2020  Primary Cardiologist: Nelva Bush, MD   Subjective   S/p thoracentesis yesterday with improvement in breathing status after this time.  No CP.  Inpatient Medications    Scheduled Meds: . amiodarone  400 mg Oral BID  . apixaban  2.5 mg Oral BID  . azithromycin  500 mg Oral Daily  . calcium-vitamin D  1 tablet Oral Daily  . Chlorhexidine Gluconate Cloth  6 each Topical Daily  . cholecalciferol  1,000 Units Oral Daily  . docusate sodium  100 mg Oral BID  . feeding supplement  237 mL Oral TID BM  . finasteride  5 mg Oral Daily  . multivitamin with minerals  1 tablet Oral Daily  . pantoprazole  40 mg Oral Daily  . polyethylene glycol  17 g Oral QHS  . QUEtiapine  25 mg Oral QHS  . timolol  1 drop Both Eyes BID  . traZODone  100 mg Oral QHS   Continuous Infusions: . cefTRIAXone (ROCEPHIN)  IV Stopped (03/21/20 1444)   PRN Meds: acetaminophen **OR** acetaminophen, alum & mag hydroxide-simeth, bisacodyl, fluticasone, methocarbamol, ondansetron **OR** ondansetron (ZOFRAN) IV, polyvinyl alcohol   Vital Signs    Vitals:   03/22/20 0200 03/22/20 0400 03/22/20 0500 03/22/20 0600  BP: 100/61 110/69 (!) 109/50 (!) 107/55  Pulse: 91 (!) 102 96 97  Resp: 17 (!) 27 20 19   Temp:  97.9 F (36.6 C)    TempSrc:  Axillary    SpO2: 90% (!) 89% 95% 95%  Weight:      Height:        Intake/Output Summary (Last 24 hours) at 03/22/2020 0801 Last data filed at 03/22/2020 0600 Gross per 24 hour  Intake 807.85 ml  Output 900 ml  Net -92.15 ml   Last 3 Weights 03/01/2020 03/06/2020 03/05/2020  Weight (lbs) 205 lb 225 lb 12 oz 222 lb 7.1 oz  Weight (kg) 92.987 kg 102.4 kg 100.9 kg      Telemetry    Atrial fibrillation, ventricular rates 90s to 110s, PVCs- Personally Reviewed  ECG    No new tracings- Personally Reviewed  Physical Exam   GEN: No acute distress.   Neck: No JVD Cardiac:  IRIR and  tachycardic, no murmurs, rubs, or gallops.  Respiratory: Bibasilar reduced breath sounds. GI: Soft, nontender, non-distended  MS: no edema; No deformity. Neuro:  Nonfocal  Psych: Normal affect   Labs    High Sensitivity Troponin:   Recent Labs  Lab 03/03/20 0020 03/10/2020 0753 03/14/2020 1046 03/02/2020 1242  TROPONINIHS 49* 159* 244* 294*      Chemistry Recent Labs  Lab 03/11/2020 0920 03/17/20 4287 03/19/20 0512 03/19/20 0512 03/20/20 0531 03/20/20 0716 03/21/20 0533 03/22/20 0612  NA 132*   < > 134*  --   --   --  134* 134*  K 4.2   < > 3.1*   < > 3.7  --  4.1 3.7  CL 94*   < > 93*  --   --   --  93* 93*  CO2 26   < > 28  --   --   --  29 32  GLUCOSE 128*   < > 128*  --   --   --  121* 142*  BUN 18   < > 45*  --   --   --  60* 59*  CREATININE 0.82   < > 1.17  --   --   --  1.70* 1.50*  CALCIUM 9.0   < > 8.6*  --   --   --  8.6* 8.5*  PROT 6.3*  --   --   --   --  5.7*  --   --   ALBUMIN 3.1*  --   --   --   --  2.7* 2.8*  --   AST 57*  --   --   --   --  48*  --   --   ALT 35  --   --   --   --  34  --   --   ALKPHOS 88  --   --   --   --  93  --   --   BILITOT 3.2*  --   --   --   --  2.0*  --   --   GFRNONAA >60   < > 60*  --   --   --  38* 45*  ANIONGAP 12   < > 13  --   --   --  12 9   < > = values in this interval not displayed.     Hematology Recent Labs  Lab 03/20/20 0531 03/21/20 0533 03/22/20 0612  WBC 12.9* 17.4* 15.0*  RBC 2.87* 3.00* 2.89*  HGB 8.9*  8.9* 9.4* 9.2*  HCT 29.1* 30.0* 28.7*  MCV 101.4* 100.0 99.3  MCH 31.0 31.3 31.8  MCHC 30.6 31.3 32.1  RDW 24.2* 23.7* 23.9*  PLT 209 188 183    BNP Recent Labs  Lab 03/03/2020 0753  BNP 1,276.9*     DDimer No results for input(s): DDIMER in the last 168 hours.   Radiology    DG Chest Port 1 View  Result Date: 03/21/2020 CLINICAL DATA:  Post LEFT thoracentesis EXAM: PORTABLE CHEST 1 VIEW COMPARISON:  Portable exam 1155 hours compared to 0855 hours FINDINGS: Enlargement of cardiac  silhouette. Diffuse BILATERAL pulmonary infiltrates. Decreased LEFT pleural effusion post thoracentesis. No pneumothorax or acute osseous findings. IMPRESSION: No pneumothorax following LEFT thoracentesis. Persistent diffuse BILATERAL pulmonary infiltrates Electronically Signed   By: Lavonia Dana M.D.   On: 03/21/2020 12:10   DG Chest Port 1 View  Result Date: 03/21/2020 CLINICAL DATA:  Acute on chronic hypoxic respiratory failure, CHF EXAM: PORTABLE CHEST 1 VIEW COMPARISON:  Chest radiograph from one day prior. FINDINGS: Stable cardiomediastinal silhouette with mild cardiomegaly. No pneumothorax. Small bilateral pleural effusions, left greater than right, mildly increased. Diffuse patchy hazy opacities throughout both lungs, slightly worsened. IMPRESSION: 1. Mild cardiomegaly. 2. Small bilateral pleural effusions, left greater than right, mildly increased. 3. Diffuse patchy hazy opacities throughout both lungs, slightly worsened, differential includes cardiogenic pulmonary edema, multifocal pneumonia or ARDS. Electronically Signed   By: Ilona Sorrel M.D.   On: 03/21/2020 09:04   DG Chest Port 1 View  Result Date: 03/20/2020 CLINICAL DATA:  Post thoracentesis EXAM: PORTABLE CHEST 1 VIEW COMPARISON:  Radiograph 03/20/2020 FINDINGS: Interval decrease in the size of a right pleural effusion post thoracentesis. No large pneumothorax. Persistent left effusion and heterogeneous airspace opacities bilaterally, not significantly changed from comparison. Cardiomediastinal contours are partially obscured by opacity albeit with stable cardiomegaly and a calcified aorta. No acute osseous or soft tissue abnormality. Degenerative changes are present in the imaged spine and shoulders. Telemetry leads overlie the chest. IMPRESSION: 1. Interval decrease in size of a right pleural effusion post thoracentesis. No large pneumothorax. 2. Otherwise unchanged left effusion and bilateral heterogeneous opacities possibly  asymmetric  edema and/or infection. Electronically Signed   By: Lovena Le M.D.   On: 03/20/2020 16:55   DG Chest Port 1 View  Result Date: 03/20/2020 CLINICAL DATA:  Respiratory distress. EXAM: PORTABLE CHEST 1 VIEW COMPARISON:  03/20/2020 and chest CT 03/10/2020 FINDINGS: A single view of the chest demonstrates patchy bilateral lung densities. Evidence for bilateral pleural effusions. Airspace densities in the right mid lung has slightly worsened but slightly decreased in the left mid lung. Heart size is slightly prominent but accentuated by technique. Heart size is grossly stable. Negative for pneumothorax. IMPRESSION: 1. Persistent bilateral lung densities that have slightly changed as described. Findings are suggestive for asymmetric pulmonary edema. Infection cannot be excluded. 2. Bilateral pleural effusions. Electronically Signed   By: Markus Daft M.D.   On: 03/20/2020 10:40   US Abdomen Limited RUQ (LIVER/GB)  Result Date: 03/21/2020 CLINICAL DATA:  Hyperbilirubinemia. EXAM: ULTRASOUND ABDOMEN LIMITED RIGHT UPPER QUADRANT COMPARISON:  CT abdomen pelvis 03/17/2020 FINDINGS: Gallbladder: Dense shadowing throughout the gallbladder compatible with calcification. There is peripheral calcification in the gallbladder fundus on the prior CT. This is presumably due to calcified gallstones although gallbladder wall calcification could have a similar appearance. Negative sonographic Murphy sign. Common bile duct: Diameter: 2.5 mm Liver: Increased echogenicity liver diffusely without focal liver lesion. Portal vein is patent on color Doppler imaging with normal direction of blood flow towards the liver. Other: None. IMPRESSION: Extensive shadowing due to gallbladder calcification. This is presumably a large gallstone although gallbladder wall calcification could also be present. This limits evaluation of the gallbladder lumen. No biliary dilatation Hyperechoic liver suggesting fatty infiltration. Electronically Signed    By: Franchot Gallo M.D.   On: 03/21/2020 09:26   US THORACENTESIS ASP PLEURAL SPACE W/IMG GUIDE  Result Date: 03/21/2020 INDICATION: Patient with history of CHF, COPD, respiratory failure ,atrial fibrillation, bilateral pleural effusions; status post right thoracentesis on 03/20/20; request now received for diagnostic and therapeutic left thoracentesis. EXAM: ULTRASOUND GUIDED DIAGNOSTIC THERAPEUTIC LEFT THORACENTESIS MEDICATIONS: 1% lidocaine to skin and subcutaneous tissue COMPLICATIONS: None immediate. PROCEDURE: An ultrasound guided thoracentesis was thoroughly discussed with the patient and questions answered. The benefits, risks, alternatives and complications were also discussed. The patient understands and wishes to proceed with the procedure. Written consent was obtained. Ultrasound was performed to localize and mark an adequate pocket of fluid in the left chest. The area was then prepped and draped in the normal sterile fashion. 1% Lidocaine was used for local anesthesia. Under ultrasound guidance a 6 Fr Safe-T-Centesis catheter was introduced. Thoracentesis was performed. The catheter was removed and a dressing applied. FINDINGS: A total of approximately 550 cc of yellow fluid was removed. Samples were sent to the laboratory as requested by the clinical team. IMPRESSION: Successful ultrasound guided diagnostic and therapeutic left thoracentesis yielding 550 cc of pleural fluid. Read by: Rowe Robert, PA-C Electronically Signed   By: Markus Daft M.D.   On: 03/21/2020 11:52   US THORACENTESIS ASP PLEURAL SPACE W/IMG GUIDE  Result Date: 03/20/2020 INDICATION: Patient with history of acute hypoxic respiratory failure with bilateral pleural effusions. Request is for therapeutic and diagnostic thoracentesis EXAM: ULTRASOUND GUIDED THERAPEUTIC AND DIAGNOSTIC THORACENTESIS MEDICATIONS: Lidocaine 1% 10 mL COMPLICATIONS: None immediate. PROCEDURE: An ultrasound guided thoracentesis was thoroughly discussed  with the patient and questions answered. The benefits, risks, alternatives and complications were also discussed. The patient understands and wishes to proceed with the procedure. Written consent was obtained. Ultrasound was performed to localize and mark an  adequate pocket of fluid in the right chest. The area was then prepped and draped in the normal sterile fashion. 1% Lidocaine was used for local anesthesia. Under ultrasound guidance a 6 Fr Safe-T-Centesis catheter was introduced. Thoracentesis was performed. The catheter was removed and a dressing applied. FINDINGS: A total of approximately 450 mL of straw-colored fluid was removed. Samples were sent to the laboratory as requested by the clinical team. IMPRESSION: Successful ultrasound guided therapeutic and diagnostic right-sided thoracentesis yielding 450 mL of pleural fluid. Read by: Rushie Nyhan, NP Electronically Signed   By: Aletta Edouard M.D.   On: 03/20/2020 16:21    Cardiac Studies   Echo 03/17/20  1. Left ventricular ejection fraction, by estimation, is 50 to 55%. The  left ventricle has low normal function. The left ventricle has no regional  wall motion abnormalities. There is moderate left ventricular hypertrophy.  Left ventricular diastolic  function could not be evaluated.  2. Right ventricular systolic function is low normal. The right  ventricular size is mildly enlarged.  3. Left atrial size was mildly dilated.  4. Right atrial size was moderately dilated.  5. The mitral valve is degenerative. Trivial mitral valve regurgitation.  6. The aortic valve is tricuspid. Aortic valve regurgitation is mild.   Echo 11/11/2019 1. Left ventricular ejection fraction, by estimation, is 55 to 60%. The  left ventricle has normal function. The left ventricle has no regional  wall motion abnormalities. There is mild left ventricular hypertrophy.  Left ventricular diastolic parameters  are consistent with Grade II diastolic  dysfunction (pseudonormalization).  2. Right ventricular systolic function is normal. The right ventricular  size is normal. There is moderately elevated pulmonary artery systolic  pressure. The estimated right ventricular systolic pressure is 92.1 mmHg.  3. Left atrial size was moderately dilated.  4. Moderate mitral valve regurgitation.  5. There is mild dilatation of the ascending aorta measuring 41 mm.   Patient Profile     86 y.o. male hx of chronic dyspnea, heart failure with improved ejection fraction (55-60 10/2019), peripheral arterial disease with abnormal right lower extremity ABIs, remote osteomyelitis, COPD, thrombocytopenia, BPH, arthritis, prior right TKA, hypertension, and who is being seen today for the evaluation of acute respiratory failure with hypoxia secondary to combination of HFpEF, pna, and complicated by Afib with RVR (new).   Assessment & Plan    Newly diagnosed Paroxysmal Afib with RVR --Rate control has been limited by BP. Current ventricular rate 90-110. Goal ventricular rate below 110bpm. CHA2DS2VASc score of at least 5 (CHF, HTN, agex2, vascular) with recommendation for indefinite anticoagulation.  Continue amiodarone 400mg  BID for rate control.   Ideally, wean as tolerated in the future and once able. Continue for now.  Continue reduced dose Eliquis 2.5mg  BID, due to renal function.  Increase back to Eliquis 5mg  BID once renal function improves with Cr below 1.5mg , as he will no longer meet criteria for reduced dosing at that time.  No current plan for inpatient DCCV.  DCCV could be considered if remains in Afib and rates difficult to control. At this time, however, breathing status not ideal for cardioversion / sedation.   Acute on chronic HFpEF --Admitted 11/25 with respiratory failure in setting of pna and COPD with volume overload. SOB/DOE improved with restart of diuresis.  As previously noted, Afib could have contributed to his respiratory  status as well.  Echo this admission with EF 50 to 55%.   SOB multifactorial -suspect underlying pulmonary process / disease  as contributing to his current sx.  Continue abx.  S/p L thoracentesis with 550cc yellow fluids.   IV lasix has been discontinued. Continue to hold, given improved renal function since holding diuresis.  Monitor volume status closely during this time.  Daily BMET.   Cr 1.70  1.50; BUN 60  59.  Replete electrolytes. K goal 4.0. Mg goal 2.0.  Monitor I's/O's.     Net -92.1 cc yesterday with total output -900cc.  Daily weights.  Wt not well documented. Previously 93kg.  CHF education.  Hypokalemia --K 3.7.  Replete with goal 4.0.  Acute on chronic hypoxic respiratory failure/AE COPD/PNA --Continue breathing treatments, abx, thoracentesis as needed.  Elevated high-sensitivity troponin  H/o of coronary artery calcium on CT --No CP. Previously declined outpatient ischemic work-up.  CAC by CT. EKG without acute ST/T changes. HS Tn minimally elevated and flat trending.  Echo this admission 50 to 55% and NRWMA. Not consistent with ACS. No plan for ischemic evaluation at this time. If chest pain in the outpatient setting, consider further ischemic work-up at that time and once recovered from hip surgery. Continue medical management.   Anemia --Monitor very closely on anticoagulation. --Daily CBC.  PAD --Previously noted reduced right ABIs.   --Not on a statin, per pt preference.   For questions or updates, please contact [facility] Please consult [email or web address] for contact info under        Signed, Arvil Chaco, PA-C  03/22/2020, 8:01 AM

## 2020-03-22 DEATH — deceased

## 2020-03-23 ENCOUNTER — Inpatient Hospital Stay: Payer: Medicare Other

## 2020-03-23 DIAGNOSIS — N170 Acute kidney failure with tubular necrosis: Secondary | ICD-10-CM

## 2020-03-23 DIAGNOSIS — Z515 Encounter for palliative care: Secondary | ICD-10-CM

## 2020-03-23 DIAGNOSIS — A419 Sepsis, unspecified organism: Secondary | ICD-10-CM | POA: Diagnosis not present

## 2020-03-23 DIAGNOSIS — I5033 Acute on chronic diastolic (congestive) heart failure: Secondary | ICD-10-CM | POA: Diagnosis not present

## 2020-03-23 DIAGNOSIS — Z7189 Other specified counseling: Secondary | ICD-10-CM

## 2020-03-23 DIAGNOSIS — R0489 Hemorrhage from other sites in respiratory passages: Secondary | ICD-10-CM | POA: Diagnosis not present

## 2020-03-23 DIAGNOSIS — J90 Pleural effusion, not elsewhere classified: Secondary | ICD-10-CM

## 2020-03-23 DIAGNOSIS — Z87891 Personal history of nicotine dependence: Secondary | ICD-10-CM

## 2020-03-23 DIAGNOSIS — D7589 Other specified diseases of blood and blood-forming organs: Secondary | ICD-10-CM

## 2020-03-23 DIAGNOSIS — D696 Thrombocytopenia, unspecified: Secondary | ICD-10-CM

## 2020-03-23 DIAGNOSIS — R918 Other nonspecific abnormal finding of lung field: Secondary | ICD-10-CM

## 2020-03-23 DIAGNOSIS — J449 Chronic obstructive pulmonary disease, unspecified: Secondary | ICD-10-CM

## 2020-03-23 DIAGNOSIS — J9601 Acute respiratory failure with hypoxia: Secondary | ICD-10-CM | POA: Diagnosis not present

## 2020-03-23 DIAGNOSIS — J189 Pneumonia, unspecified organism: Secondary | ICD-10-CM | POA: Diagnosis not present

## 2020-03-23 LAB — COMPREHENSIVE METABOLIC PANEL
ALT: 30 U/L (ref 0–44)
AST: 46 U/L — ABNORMAL HIGH (ref 15–41)
Albumin: 2.5 g/dL — ABNORMAL LOW (ref 3.5–5.0)
Alkaline Phosphatase: 107 U/L (ref 38–126)
Anion gap: 8 (ref 5–15)
BUN: 49 mg/dL — ABNORMAL HIGH (ref 8–23)
CO2: 31 mmol/L (ref 22–32)
Calcium: 8.7 mg/dL — ABNORMAL LOW (ref 8.9–10.3)
Chloride: 93 mmol/L — ABNORMAL LOW (ref 98–111)
Creatinine, Ser: 1.38 mg/dL — ABNORMAL HIGH (ref 0.61–1.24)
GFR, Estimated: 49 mL/min — ABNORMAL LOW (ref >60)
Glucose, Bld: 119 mg/dL — ABNORMAL HIGH (ref 70–99)
Potassium: 3.9 mmol/L (ref 3.5–5.1)
Sodium: 132 mmol/L — ABNORMAL LOW (ref 135–145)
Total Bilirubin: 1.7 mg/dL — ABNORMAL HIGH (ref 0.3–1.2)
Total Protein: 5.7 g/dL — ABNORMAL LOW (ref 6.5–8.1)

## 2020-03-23 LAB — COMP PANEL: LEUKEMIA/LYMPHOMA

## 2020-03-23 LAB — RESP PANEL BY RT-PCR (FLU A&B, COVID) ARPGX2
Influenza A by PCR: NEGATIVE
Influenza B by PCR: NEGATIVE
SARS Coronavirus 2 by RT PCR: NEGATIVE

## 2020-03-23 LAB — CULTURE, RESPIRATORY W GRAM STAIN
Culture: NORMAL
Gram Stain: NONE SEEN

## 2020-03-23 LAB — LACTATE DEHYDROGENASE: LDH: 367 U/L — ABNORMAL HIGH (ref 98–192)

## 2020-03-23 LAB — MAGNESIUM: Magnesium: 2.2 mg/dL (ref 1.7–2.4)

## 2020-03-23 LAB — CBC
HCT: 30.1 % — ABNORMAL LOW (ref 39.0–52.0)
Hemoglobin: 9.7 g/dL — ABNORMAL LOW (ref 13.0–17.0)
MCH: 32.1 pg (ref 26.0–34.0)
MCHC: 32.2 g/dL (ref 30.0–36.0)
MCV: 99.7 fL (ref 80.0–100.0)
Platelets: 165 10*3/uL (ref 150–400)
RBC: 3.02 MIL/uL — ABNORMAL LOW (ref 4.22–5.81)
RDW: 24.4 % — ABNORMAL HIGH (ref 11.5–15.5)
WBC: 15 10*3/uL — ABNORMAL HIGH (ref 4.0–10.5)
nRBC: 0.9 % — ABNORMAL HIGH (ref 0.0–0.2)

## 2020-03-23 LAB — PHOSPHORUS: Phosphorus: 2.1 mg/dL — ABNORMAL LOW (ref 2.5–4.6)

## 2020-03-23 LAB — TRIGLYCERIDES, BODY FLUIDS: Triglycerides, Fluid: 9 mg/dL

## 2020-03-23 LAB — PROCALCITONIN: Procalcitonin: <0.1 ng/mL

## 2020-03-23 MED ORDER — POTASSIUM & SODIUM PHOSPHATES 280-160-250 MG PO PACK
1.0000 | PACK | Freq: Three times a day (TID) | ORAL | Status: AC
  Administered 2020-03-23 (×2): 1 via ORAL
  Filled 2020-03-23 (×2): qty 1

## 2020-03-23 MED ORDER — APIXABAN 5 MG PO TABS
5.0000 mg | ORAL_TABLET | Freq: Two times a day (BID) | ORAL | Status: DC
  Administered 2020-03-23 – 2020-03-25 (×6): 5 mg via ORAL
  Filled 2020-03-23 (×6): qty 1

## 2020-03-23 MED ORDER — METHYLPREDNISOLONE SODIUM SUCC 40 MG IJ SOLR
40.0000 mg | INTRAMUSCULAR | Status: DC
  Administered 2020-03-24 – 2020-03-25 (×2): 40 mg via INTRAVENOUS
  Filled 2020-03-23 (×2): qty 1

## 2020-03-23 MED ORDER — METHYLPREDNISOLONE SODIUM SUCC 40 MG IJ SOLR
40.0000 mg | Freq: Two times a day (BID) | INTRAMUSCULAR | Status: DC
  Administered 2020-03-23: 40 mg via INTRAVENOUS
  Filled 2020-03-23: qty 1

## 2020-03-23 NOTE — Consult Note (Signed)
NAME: Raymond Barnett  DOB: 06/28/1929  MRN: [id number]  Date/Time: 03/23/2020 6:27 PM  REQUESTING PROVIDER: Deniece Portela Subjective:  REASON FOR CONSULT: respiratory failure? covid vaccine related ? Raymond Barnett is a 86 y.o. with a history of COPD, paraseptal emphysema chronic declining resp status,on home oxygen  followed by pulmonary  , cardiomyopathy, CHF PVD, HTN,  Recent hospitalization 03/02/20-03/07/20 for fracture of femoral neck Rt , underwent rt arthroplasty on 03/03/20 and was sent to rehab on lovenox for 14 days presented to the ED via EMS [facility] on 03/17/2020  with SOB and pulse ox of 82% He had a cough with white sputum but no fever In the ED vitals  RR 30, Temp 97.6, BP 150.78, HR 121 Labs showed procal < 0.10, WBC 15.8, Hb 10.4, Na 132, K 4.2, cr 0.82, alb 3.1ast 57, and bili 3.2 Twelve-lead EKG shows sinus tachycardia with a right bundle branch block and left anterior fascicular block CXR showed b/l infilrates and b/l Pleural effusion  BNP > 1200 Started vanco /cefepime Seen by pulmonologist on 03/20/20 and diagnosis was decompensated chf and recommended throacentesis which was done on 11/29( rt 450 cc) and 11/30 ( left 550cc) and did not show profile of infection  I am asked to see patient to r/o any covid vaccine related issues PT received primary series in Jan/feb and booster in Oct Pt has had SOB since June and is followed by [facility] He has been diagnosed with paraseptal emphysema and ILD by CT scan from 2019 and cxrs , and because of  worsening sob since May/June on home oxygen HE was having pain left hip and worsening SOB and came to the ED on 03/02/20 CXR 03/02/20 shown below was quite okay with small b/l pleural effusion and some interstitial markings and basal atelectasis Xray left hip no fracture CT pelvis No definite acute osseous abnormality of left hip. Chronic appearing osseous deformity of left iliac crest without evidence for bony  destructive change or associated soft tissue mass. 2. Findings are suspicious for an acute nondisplaced right femoral neck fracture HE had no h/o fall He underwent rt bipolar hemiarthroplasty and was sent to rehab on 11/16 on lovenox HE presented with worsening sob without any fever He also c/o coughing up purple colored sputum last week He has been followed by Dr.Brahmandy heme onc for chronic thrombocytopenia and macrocytic anemia and concern for MDS_ but no bone marrow biopsy  Past Medical History:  Diagnosis Date  . (HFpEF) heart failure with improved ejection fraction ([place])    a. 08/2017 Echo: EF 45-50%; b. 10/2019 Echo: EF 55-60%. No rwma, mild LVH, Gr2 DD, RVSP 47.69mHg. Mod dil LA. Mod MR. Asc Ao 447m  . Arthritis   . Benign prostate hyperplasia    lower urinary tract symptoms  . Cataracts, bilateral   . COPD (ch[place] obstructive pulmonary disease) ([place]  . DDD (degenerative disc disease), lumbar   . Glaucoma   . H/O carpal tunnel syndrome   . History of osteomyelitis 1953   right leg  . Hypertension   . Moderate mitral regurgitation    a. 10/2019 Echo: Mod MR.  . Marland KitchenAD (peripheral artery disease) ([place]   a. 08/2017 LE ABI & Duplex: ABI R 0.60, L 1.07. 30-49% R popliteal and 50-74% R PT stenoses.  . Thrombocytopenia (H[facility])     Compression fracture L4 Chronic ulcer rt ankle in 2018 at the site of a distant  pilon type fracture H/o osteomyelitis at  the fracture site 60 yes ago  Fracture sustained while stationed in [place] 60 yr sago Ankle fusion rt   Past Surgical History:  Procedure Laterality Date  . CARPAL TUNNEL RELEASE Left   . CARPAL TUNNEL RELEASE Right   . COLONOSCOPY  2015   Dr. Allen Norris  . HIP ARTHROPLASTY Right 03/03/2020   Procedure: ARTHROPLASTY BIPOLAR HIP (HEMIARTHROPLASTY);  Surgeon: Hessie Knows, MD;  Location: [facility] ORS;  Service: Orthopedics;  Laterality: Right;  . LEG SURGERY Right 1953  . SKIN GRAFT Right 05/01/2017   right foot [facility]  . TOTAL KNEE  ARTHROPLASTY Right     Social History   Socioeconomic History  . Marital status: Married    Spouse name: Not on file  . Number of children: Not on file  . Years of education: Not on file  . Highest education level: High school graduate  Occupational History  . Not on file  Tobacco Use  . Smoking status: Former Smoker    Packs/day: 1.00    Years: 30.00    Pack years: 30.00    Types: Cigarettes    Quit date: 1995    Years since quitting: 26.9  . Smokeless tobacco: Never Used  Vaping Use  . Vaping Use: Never used  Substance and Sexual Activity  . Alcohol use: Not Currently    Comment: occasionally wine   . Drug use: No  . Sexual activity: Not Currently  Other Topics Concern  . Not on file  Social History Narrative  . Not on file   Social Determinants of Health   Financial Resource Strain:   . Difficulty of Paying Living Expenses: Not on file  Food Insecurity:   . Worried About Charity fundraiser in the Last Year: Not on file  . Ran Out of Food in the Last Year: Not on file  Transportation Needs:   . Lack of Transportation (Medical): Not on file  . Lack of Transportation (Non-Medical): Not on file  Physical Activity:   . Days of Exercise per Week: Not on file  . Minutes of Exercise per Session: Not on file  Stress:   . Feeling of Stress : Not on file  Social Connections:   . Frequency of Communication with Friends and Family: Not on file  . Frequency of Social Gatherings with Friends and Family: Not on file  . Attends Religious Services: Not on file  . Active Member of Clubs or Organizations: Not on file  . Attends Archivist Meetings: Not on file  . Marital Status: Not on file  Intimate Partner Violence:   . Fear of Current or Ex-Partner: Not on file  . Emotionally Abused: Not on file  . Physically Abused: Not on file  . Sexually Abused: Not on file    Family History  Problem Relation Age of Onset  . Colon cancer Mother 79  . Thrombosis Father  26  . Heart disease Father        Heart valve problem  . AAA (abdominal aortic aneurysm) Father   . Hypertension Sister   . Hyperlipidemia Sister   . Hypertension Sister   . Hyperlipidemia Sister   . Hypertension Sister   . Hyperlipidemia Sister    No Known Allergies  ? Current Facility-Administered Medications  Medication Dose Route Frequency Provider Last Rate Last Admin  . acetaminophen (TYLENOL) tablet 650 mg  650 mg Oral Q6H PRN Agbata, Tochukwu, MD   650 mg at 03/22/20 0405   Or  .  acetaminophen (TYLENOL) suppository 650 mg  650 mg Rectal Q6H PRN Agbata, Tochukwu, MD      . alum & mag hydroxide-simeth (MAALOX/MYLANTA) 200-200-20 MG/5ML suspension 30 mL  30 mL Oral Q4H PRN Agbata, Tochukwu, MD   30 mL at 03/23/20 1237  . amiodarone (PACERONE) tablet 400 mg  400 mg Oral BID Kathlyn Sacramento A, MD   400 mg at 03/23/20 7121  . apixaban (ELIQUIS) tablet 5 mg  5 mg Oral BID Ottie Glazier, MD   5 mg at 03/23/20 0952  . azithromycin (ZITHROMAX) tablet 500 mg  500 mg Oral Daily Fritzi Mandes, MD   500 mg at 03/23/20 9758  . bisacodyl (DULCOLAX) suppository 10 mg  10 mg Rectal Daily PRN Agbata, Tochukwu, MD   10 mg at 03/17/20 1436  . calcium-vitamin D (OSCAL WITH D) 500-200 MG-UNIT per tablet 1 tablet  1 tablet Oral Daily Agbata, Tochukwu, MD   1 tablet at 03/23/20 0952  . cefTRIAXone (ROCEPHIN) 2 g in sodium chloride 0.9 % 100 mL IVPB  2 g Intravenous Q24H Fritzi Mandes, MD 200 mL/hr at 03/23/20 1339 2 g at 03/23/20 1339  . Chlorhexidine Gluconate Cloth 2 % PADS 6 each  6 each Topical Daily Fritzi Mandes, MD   6 each at 03/23/20 1721  . cholecalciferol (VITAMIN D) tablet 1,000 Units  1,000 Units Oral Daily Agbata, Tochukwu, MD   1,000 Units at 03/23/20 0951  . docusate sodium (COLACE) capsule 100 mg  100 mg Oral BID Agbata, Tochukwu, MD   100 mg at 03/23/20 0952  . feeding supplement (ENSURE ENLIVE / ENSURE PLUS) liquid 237 mL  237 mL Oral TID BM Fritzi Mandes, MD   237 mL at 03/23/20 1001  .  finasteride (PROSCAR) tablet 5 mg  5 mg Oral Daily Agbata, Tochukwu, MD   5 mg at 03/23/20 0952  . fluticasone (FLONASE) 50 MCG/ACT nasal spray 2 spray  2 spray Each Nare Daily PRN Agbata, Tochukwu, MD      . methocarbamol (ROBAXIN) tablet 500 mg  500 mg Oral Q6H PRN Agbata, Tochukwu, MD   500 mg at 03/22/20 2138  . methylPREDNISolone sodium succinate (SOLU-MEDROL) 40 mg/mL injection 40 mg  40 mg Intravenous BID Loletha Grayer, MD   40 mg at 03/23/20 1335  . multivitamin with minerals tablet 1 tablet  1 tablet Oral Daily Fritzi Mandes, MD   1 tablet at 03/23/20 [id number]  . ondansetron (ZOFRAN) tablet 4 mg  4 mg Oral Q6H PRN Agbata, Tochukwu, MD       Or  . ondansetron (ZOFRAN) injection 4 mg  4 mg Intravenous Q6H PRN Agbata, Tochukwu, MD   4 mg at 03/20/20 0742  . pantoprazole (PROTONIX) EC tablet 40 mg  40 mg Oral Daily Agbata, Tochukwu, MD   40 mg at 03/23/20 0952  . polyethylene glycol (MIRALAX / GLYCOLAX) packet 17 g  17 g Oral QHS Agbata, Tochukwu, MD   17 g at 03/21/20 2144  . polyvinyl alcohol (LIQUIFILM TEARS) 1.4 % ophthalmic solution 1 drop  1 drop Both Eyes PRN Agbata, Tochukwu, MD      . QUEtiapine (SEROQUEL) tablet 25 mg  25 mg Oral QHS Lang Snow, NP   25 mg at 03/22/20 2115  . timolol (TIMOPTIC) 0.5 % ophthalmic solution 1 drop  1 drop Both Eyes BID Agbata, Tochukwu, MD   1 drop at 03/23/20 0953     Abtx:  Anti-infectives (From admission, onward)   Start     Dose/Rate  Route Frequency Ordered Stop   03/20/20 1415  cefTRIAXone (ROCEPHIN) 1 g in sodium chloride 0.9 % 100 mL IVPB  Status:  Discontinued        1 g 200 mL/hr over 30 Minutes Intravenous Every 24 hours 03/20/20 1322 03/20/20 1329   03/20/20 1415  azithromycin (ZITHROMAX) tablet 500 mg        500 mg Oral Daily 03/20/20 1322     03/20/20 1415  cefTRIAXone (ROCEPHIN) 2 g in sodium chloride 0.9 % 100 mL IVPB        2 g 200 mL/hr over 30 Minutes Intravenous Every 24 hours 03/20/20 1329     03/04/2020 2200   vancomycin (VANCOREADY) IVPB 1250 mg/250 mL  Status:  Discontinued        1,250 mg 166.7 mL/hr over 90 Minutes Intravenous Every 12 hours 03/18/2020 1029 03/17/20 1152   03/13/2020 1400  ceFEPIme (MAXIPIME) 2 g in sodium chloride 0.9 % 100 mL IVPB  Status:  Discontinued        2 g 200 mL/hr over 30 Minutes Intravenous Every 8 hours 03/15/2020 1029 03/18/20 1300   03/19/2020 0930  vancomycin (VANCOREADY) IVPB 2000 mg/400 mL        2,000 mg 200 mL/hr over 120 Minutes Intravenous  Once 03/10/2020 0846 03/15/2020 1157   03/12/2020 0900  ceFEPIme (MAXIPIME) 2 g in sodium chloride 0.9 % 100 mL IVPB        2 g 200 mL/hr over 30 Minutes Intravenous  Once 03/09/2020 0846 03/20/2020 0951      REVIEW OF SYSTEMS:  Const: negative fever, negative chills, negative weight loss Eyes: negative diplopia or visual changes, negative eye pain ENT: negative coryza, negative sore throat Resp: negative cough, hemoptysis, dyspnea Cards: negative for chest pain, palpitations, lower extremity edema GU: negative for frequency, dysuria and hematuria GI: Negative for abdominal pain, diarrhea, bleeding, constipation Skin: negative for rash and pruritus Heme: negative for easy bruising and gum/nose bleeding YP:[id number] weakness, pain hips Neurolo:negative for headaches, dizziness, vertigo, memory problems  Psych: negative for feelings of anxiety, depression  Endocrine: negative for thyroid, diabetes Allergy/Immunology- negative for any medication or food allergies ?  Objective:  VITALS:  BP 115/67   Pulse (!) 109   Temp (!) 97.4 F (36.3 C) (Axillary)   Resp (!) 31   Ht _0  (1.778 m)   Wt 93 kg   SpO2 91%   BMI 29.41 kg/m  PHYSICAL EXAM:  General: Alert, cooperative, no distress,on HI flo- young for age.  Head: Normocephalic, without obvious abnormality, atraumatic. Eyes: Conjunctivae clear, anicteric sclerae. Pupils are equal ENT Nares normal. No drainage or sinus tenderness. Lips, mucosa, and tongue normal. No  Thrush Neck: Supple, symmetrical, no adenopathy, thyroid: non tender no carotid bruit and no JVD. Back:did not examine. Lungs:b./l air entry- crepts both lungs Heart: tachycardia Abdomen: Soft, non-tender,not distended. Bowel sounds normal. No masses Extremities: rt hip area- surrgicall site clean- staples have been removed- healed well no erythema.,  edem alegs Rt Knee scar Rt shin scar Lateral ankle scab      Skin: No rashes or lesions. Or bruising Lymph: Cervical, supraclavicular normal. Neurologic: Grossly non-focal Pertinent Labs Lab Results CBC    Component Value Date/Time   WBC 15.0 (H) 03/23/2020 0654   RBC 3.02 (L) 03/23/2020 0654   HGB 9.7 (L) 03/23/2020 0654   HGB 14.1 02/16/2015 1001   HCT 30.1 (L) 03/23/2020 0654   HCT 41.4 02/16/2015 1001   PLT 165 03/23/2020 0654  PLT 102 (L) 02/16/2015 1001   MCV 99.7 03/23/2020 0654   MCV 96 02/16/2015 1001   MCV 96 08/03/2014 1013   MCH 32.1 03/23/2020 0654   MCHC 32.2 03/23/2020 0654   RDW 24.4 (H) 03/23/2020 0654   RDW 14.6 02/16/2015 1001   RDW 14.6 (H) 08/03/2014 1013   LYMPHSABS 0.7 03/02/2020 0753   LYMPHSABS 1.7 02/16/2015 1001   LYMPHSABS 1.6 08/03/2014 1013   MONOABS 1.1 (H) 02/24/2020 0753   MONOABS 0.5 08/03/2014 1013   EOSABS 0.0 03/13/2020 0753   EOSABS 0.2 02/16/2015 1001   EOSABS 0.1 08/03/2014 1013   BASOSABS 0.1 03/12/2020 0753   BASOSABS 0.0 02/16/2015 1001   BASOSABS 0.0 08/03/2014 1013    CMP Latest Ref Rng & Units 03/23/2020 03/22/2020 03/21/2020  Glucose 70 - 99 mg/dL 119(H) 142(H) 121(H)  BUN 8 - 23 mg/dL 49(H) 59(H) 60(H)  Creatinine 0.61 - 1.24 mg/dL 1.38(H) 1.50(H) 1.70(H)  Sodium 135 - 145 mmol/L 132(L) 134(L) 134(L)  Potassium 3.5 - 5.1 mmol/L 3.9 3.7 4.1  Chloride 98 - 111 mmol/L 93(L) 93(L) 93(L)  CO2 22 - 32 mmol/L 31 32 29  Calcium 8.9 - 10.3 mg/dL 8.7(L) 8.5(L) 8.6(L)  Total Protein 6.5 - 8.1 g/dL 5.7(L) - -  Total Bilirubin 0.3 - 1.2 mg/dL 1.7(H) - -  Alkaline Phos 38  - 126 U/L 107 - -  AST 15 - 41 U/L 46(H) - -  ALT 0 - 44 U/L 30 - -      Microbiology: Recent Results (from the past 240 hour(s))  Blood culture (routine x 2)     Status: None   Collection Time: 03/07/2020  7:53 AM   Specimen: BLOOD  Result Value Ref Range Status   Specimen Description BLOOD RIGHT HAND  Final   Special Requests   Final    BOTTLES DRAWN AEROBIC AND ANAEROBIC Blood Culture adequate volume   Culture   Final    NO GROWTH 5 DAYS Performed at [facility], [address] [place], [place] [address]    Report Status 03/21/2020 FINAL  Final  Resp Panel by RT-PCR (Flu A&B, Covid) Nasopharyngeal Swab     Status: None   Collection Time: 03/07/2020  7:58 AM   Specimen: Nasopharyngeal Swab; Nasopharyngeal(NP) swabs in vial transport medium  Result Value Ref Range Status   SARS Coronavirus 2 by RT PCR NEGATIVE NEGATIVE Final    Comment: (NOTE) SARS-CoV-2 target nucleic acids are NOT DETECTED.  The SARS-CoV-2 RNA is generally detectable in upper respiratory specimens during the acute phase of infection. The lowest concentration of SARS-CoV-2 viral copies this assay can detect is 138 copies/mL. A negative result does not preclude SARS-Cov-2 infection and should not be used as the sole basis for treatment or other patient management decisions. A negative result may occur with  improper specimen collection/handling, submission of specimen other than nasopharyngeal swab, presence of viral mutation(s) within the areas targeted by this assay, and inadequate number of viral copies(<138 copies/mL). A negative result must be combined with clinical observations, patient history, and epidemiological information. The expected result is Negative.  Fact Sheet for Patients:  EntrepreneurPulse.com.au  Fact Sheet for Healthcare Providers:  IncredibleEmployment.be  This test is no t yet approved or cleared by the [place] FDA and  has  been authorized for detection and/or diagnosis of SARS-CoV-2 by FDA under an Emergency Use Authorization (EUA). This EUA will remain  in effect (meaning this test can be used) for the duration of  the COVID-19 declaration under Section 564(b)(1) of the Act, 21 U.S.C.section 360bbb-3(b)(1), unless the authorization is terminated  or revoked sooner.       Influenza A by PCR NEGATIVE NEGATIVE Final   Influenza B by PCR NEGATIVE NEGATIVE Final    Comment: (NOTE) The Xpert Xpress SARS-CoV-2/FLU/RSV plus assay is intended as an aid in the diagnosis of influenza from Nasopharyngeal swab specimens and should not be used as a sole basis for treatment. Nasal washings and aspirates are unacceptable for Xpert Xpress SARS-CoV-2/FLU/RSV testing.  Fact Sheet for Patients: EntrepreneurPulse.com.au  Fact Sheet for Healthcare Providers: IncredibleEmployment.be  This test is not yet approved or cleared by the [place] FDA and has been authorized for detection and/or diagnosis of SARS-CoV-2 by FDA under an Emergency Use Authorization (EUA). This EUA will remain in effect (meaning this test can be used) for the duration of the COVID-19 declaration under Section 564(b)(1) of the Act, 21 U.S.C. section 360bbb-3(b)(1), unless the authorization is terminated or revoked.  Performed at [facility], [address] [place], [place] [address]   Blood culture (routine x 2)     Status: None   Collection Time: 02/22/2020  7:58 AM   Specimen: BLOOD  Result Value Ref Range Status   Specimen Description BLOOD LEFT AC  Final   Special Requests   Final    BOTTLES DRAWN AEROBIC AND ANAEROBIC Blood Culture results may not be optimal due to an inadequate volume of blood received in culture bottles   Culture   Final    NO GROWTH 5 DAYS Performed at [facility], [address] [place], [place] [address]    Report Status 03/21/2020 FINAL  Final  MRSA  PCR Screening     Status: None   Collection Time: 03/17/20  9:51 AM   Specimen: Nasal Mucosa; Nasopharyngeal  Result Value Ref Range Status   MRSA by PCR NEGATIVE NEGATIVE Final    Comment:        The GeneXpert MRSA Assay (FDA approved for NASAL specimens only), is one component of a comprehensive MRSA colonization surveillance program. It is not intended to diagnose MRSA infection nor to guide or monitor treatment for MRSA infections. Performed at [facility], [address] [place], [place] [address]   Expectorated sputum assessment w rflx to resp cult     Status: None   Collection Time: 03/20/20  2:56 PM   Specimen: Expectorated Sputum  Result Value Ref Range Status   Specimen Description EXPECTORATED SPUTUM  Final   Special Requests NONE  Final   Sputum evaluation   Final    THIS SPECIMEN IS ACCEPTABLE FOR SPUTUM CULTURE Performed at [facility], [address] [place], [place] [address]    Report Status 03/20/2020 FINAL  Final  Culture, respiratory     Status: None   Collection Time: 03/20/20  2:56 PM  Result Value Ref Range Status   Specimen Description   Final    EXPECTORATED SPUTUM Performed at [facility], [address] [place], [place] [address]    Special Requests   Final    NONE Reflexed from [id number] Performed at [facility], [place], [place] [address]    Gram Stain   Final    NO WBC SEEN FEW SQUAMOUS EPITHELIAL CELLS PRESENT ABUNDANT GRAM POSITIVE COCCI FEW GRAM NEGATIVE RODS FEW GRAM POSITIVE RODS    Culture   Final    Normal respiratory flora-no Staph aureus or Pseudomonas  seen Performed at [facility], [place] [address] [place], [place] [address]    Report Status 03/23/2020 FINAL  Final  Body fluid culture     Status: None (Preliminary result)   Collection Time: 03/20/20  4:00 PM   Specimen: PATH Cytology Pleural fluid  Result Value Ref Range Status   Specimen Description   Final     PLEURAL Performed at [facility], [address] [place], [place] [address]    Special Requests   Final    NONE Performed at [facility], [address] [place], [place] [address]    Gram Stain   Final    RARE WBC PRESENT,BOTH PMN AND MONONUCLEAR NO ORGANISMS SEEN    Culture   Final    NO GROWTH 3 DAYS Performed at [facility], [place] [address] [place], [place] [address]    Report Status PENDING  Incomplete  Acid Fast Smear (AFB)     Status: None   Collection Time: 03/20/20  4:00 PM   Specimen: PATH Cytology Pleural fluid  Result Value Ref Range Status   AFB Specimen Processing Concentration  Final   Acid Fast Smear Negative  Final    Comment: (NOTE) Performed At: [facility] [place], [place] [id number] Rush Farmer MD RJ:[id number]    Source (AFB) PLEURAL  Final    Comment: Performed at [facility], [address] [place], [place] [address]  Body fluid culture     Status: None (Preliminary result)   Collection Time: 03/21/20 11:49 AM   Specimen: PATH Cytology Pleural fluid  Result Value Ref Range Status   Specimen Description   Final    PLEURAL Performed at [facility], [address] [place], [place] [address]    Special Requests   Final    NONE Performed at [facility], [address] [place], [place] [address]    Gram Stain   Final    RARE WBC PRESENT, PREDOMINANTLY MONONUCLEAR NO ORGANISMS SEEN    Culture   Final    NO GROWTH 2 DAYS Performed at [facility], [place] [address] [place], [place] [address]    Report Status PENDING  Incomplete  Resp Panel by RT-PCR (Flu A&B, Covid) Nasopharyngeal Swab     Status: None   Collection Time: 03/23/20 10:34 AM   Specimen: Nasopharyngeal Swab; Nasopharyngeal(NP) swabs in vial transport medium  Result Value Ref Range Status   SARS Coronavirus 2 by RT PCR NEGATIVE NEGATIVE Final    Comment: (NOTE) SARS-CoV-2 target  nucleic acids are NOT DETECTED.  The SARS-CoV-2 RNA is generally detectable in upper respiratory specimens during the acute phase of infection. The lowest concentration of SARS-CoV-2 viral copies this assay can detect is 138 copies/mL. A negative result does not preclude SARS-Cov-2 infection and should not be used as the sole basis for treatment or other patient management decisions. A negative result may occur with  improper specimen collection/handling, submission of specimen other than nasopharyngeal swab, presence of viral mutation(s) within the areas targeted by this assay, and inadequate number of viral copies(<138 copies/mL). A negative result must be combined with clinical observations, patient history, and epidemiological information. The expected result is Negative.  Fact Sheet for Patients:  EntrepreneurPulse.com.au  Fact Sheet for Healthcare Providers:  IncredibleEmployment.be  This test is no t yet approved or cleared by the [place] FDA and  has been authorized for detection and/or diagnosis of SARS-CoV-2 by FDA  under an Emergency Use Authorization (EUA). This EUA will remain  in effect (meaning this test can be used) for the duration of the COVID-19 declaration under Section 564(b)(1) of the Act, 21 U.S.C.section 360bbb-3(b)(1), unless the authorization is terminated  or revoked sooner.       Influenza A by PCR NEGATIVE NEGATIVE Final   Influenza B by PCR NEGATIVE NEGATIVE Final    Comment: (NOTE) The Xpert Xpress SARS-CoV-2/FLU/RSV plus assay is intended as an aid in the diagnosis of influenza from Nasopharyngeal swab specimens and should not be used as a sole basis for treatment. Nasal washings and aspirates are unacceptable for Xpert Xpress SARS-CoV-2/FLU/RSV testing.  Fact Sheet for Patients: EntrepreneurPulse.com.au  Fact Sheet for Healthcare  Providers: IncredibleEmployment.be  This test is not yet approved or cleared by the [place] FDA and has been authorized for detection and/or diagnosis of SARS-CoV-2 by FDA under an Emergency Use Authorization (EUA). This EUA will remain in effect (meaning this test can be used) for the duration of the COVID-19 declaration under Section 564(b)(1) of the Act, 21 U.S.C. section 360bbb-3(b)(1), unless the authorization is terminated or revoked.  Performed at [facility], [address] [place], [place] [address]     IMAGING RESULTS: 03/07/2020   03/20/20   03/21/20   12/2 ? Impression/Recommendation ?Acute hypoxic respiratory failure with b/l pulmonary infiltrates with procal < 0.10 and BNP > 1200 with underling COPD, ILD/Pulmonary fibrosis on home oxygen  Has b/l infiltrates in the CXR from 11/25 and the one on 11/11/ was pretty norma D.D pulmonary edema pulmonary hemorrhage ( had coughed up purple colored sputum last week) CAP/HCAP less likely with normal procal but is being treated  COVID neg This is not due to vaccine which he got in late sept/oct Also PJP, fungal infections are unlikely-  ARDS? Fat/emboli? ???  HE will need bronchoscopy to see forany old blood and for studies  Recent rt femoral head fracture without a known fall- s/p hemiarthroplasty Site healed well  H/O thrombocytopenia and Macrocytosis- followed by oncology Now with pleural fluid cytometry showing polytypic plasma cell population we need to investigate this further  Leucocytosis - stress reaction, infection, steroids New onset Afib- on amio/dig  Hyperbilirubinemia - could be from hemolysis from blood in lungs?? /LDH high/    ? ___________________________________________________ Discussed with patient,daughter and Dr.Aleskerov Note:  This document was prepared using Systems analyst and may include unintentional dictation errors.

## 2020-03-23 NOTE — Progress Notes (Signed)
CRITICAL CARE CONSULT      Referring physician: Dr Posey Pronto  CHIEF COMPLAINT:   Acute hypoxemic respiratory failure   SUBJECTIVE FINDINGS & SIGNIFICANT EVENTS   70M from SNF with hx of CHF EF 45, COPD, Right Hip fracture s/p surg coming in for worsening dyspnea and hypoxemia. Per daughter Mechele Claude patient has been declining for appx several months. He was found to have moderate pleural effusions bilaterally and multifocal pneumonia suggestive of CHF with possible overlying infection. BNP was >1200.    03/20/20- Met with daughter, reviewed findings and care plan. Discussed short term plan with cardiology and hospitalist team.   03/21/20- s/p thoracentesis on left with 500cc straw colored fluid.  Patient feels slighly improved was smiling and shook my hand during examination.   12/1-patient improved with weaning of FIO2 , today   03/23/20- patient remains with elevated settings on HFNC. I spoke with daughter today.  Seems post booster patient got severe bilateral pneumonia. Will ask ID to evaluate due to little change despite aggressive medical management.   PAST MEDICAL HISTORY   Past Medical History:  Diagnosis Date  . (HFpEF) heart failure with improved ejection fraction ([place])    a. 08/2017 Echo: EF 45-50%; b. 10/2019 Echo: EF 55-60%. No rwma, mild LVH, Gr2 DD, RVSP 47.78mHg. Mod dil LA. Mod MR. Asc Ao 453m  . Arthritis   . Benign prostate hyperplasia    lower urinary tract symptoms  . Cataracts, bilateral   . COPD [place] obstructive [place] disease) [place]  . DDD (degenerative disc disease), lumbar   . Glaucoma   . H/O carpal tunnel syndrome   . History of osteomyelitis 1953   right leg  . Hypertension   . Moderate mitral regurgitation    a. 10/2019 Echo: Mod MR.  . Marland KitchenAD (peripheral artery disease) ([place]    a. 08/2017 LE ABI & Duplex: ABI R 0.60, L 1.07. 30-49% R popliteal and 50-74% R PT stenoses.  . Thrombocytopenia ([facility] [place]     SURGICAL HISTORY   Past Surgical History:  Procedure Laterality Date  . CARPAL TUNNEL RELEASE Left   . CARPAL TUNNEL RELEASE Right   . COLONOSCOPY  2015   Dr. [name]. HIP ARTHROPLASTY Right 03/03/2020   Procedure: ARTHROPLASTY BIPOLAR HIP (HEMIARTHROPLASTY);  Surgeon: MeHessie KnowsMD;  Location: [facility] ORS;  Service: Orthopedics;  Laterality: Right;  . LEG SURGERY Right 1953  . SKIN GRAFT Right 05/01/2017   [facility]. TOTAL KNEE ARTHROPLASTY Right      FAMILY HISTORY   Family History  Problem Relation Age of Onset  . Colon cancer Mother 5168. Thrombosis Father 6474. Heart disease Father        Heart valve problem  . AAA (abdominal aortic aneurysm) Father   . Hypertension Sister   . Hyperlipidemia Sister   . Hypertension Sister   . Hyperlipidemia Sister   . Hypertension Sister   . Hyperlipidemia Sister      SOCIAL HISTORY   Social History   Tobacco Use  . Smoking status: Former Smoker    Packs/day: 1.00    Years: 30.00    Pack years: 30.00    Types: Cigarettes    Quit date: 1995    Years since quitting: 26.9  . Smokeless tobacco: Never Used  Vaping Use  . Vaping Use: Never used  Substance Use Topics  . Alcohol use: Not Currently    Comment: occasionally wine   .  Drug use: No     MEDICATIONS   Current Medication:  Current Facility-Administered Medications:  .  acetaminophen (TYLENOL) tablet 650 mg, 650 mg, Oral, Q6H PRN, 650 mg at 03/22/20 0405 **OR** acetaminophen (TYLENOL) suppository 650 mg, 650 mg, Rectal, Q6H PRN, Agbata, Tochukwu, MD .  alum & mag hydroxide-simeth (MAALOX/MYLANTA) 200-200-20 MG/5ML suspension 30 mL, 30 mL, Oral, Q4H PRN, Agbata, Tochukwu, MD, 30 mL at 03/23/20 1237 .  amiodarone (PACERONE) tablet 400 mg, 400 mg, Oral, BID, Kathlyn Sacramento A, MD, 400 mg at 03/23/20 0952 .  apixaban (ELIQUIS)  tablet 5 mg, 5 mg, Oral, BID, Lanney Gins, Morissa Obeirne, MD, 5 mg at 03/23/20 0952 .  azithromycin (ZITHROMAX) tablet 500 mg, 500 mg, Oral, Daily, Fritzi Mandes, MD, 500 mg at 03/23/20 0952 .  bisacodyl (DULCOLAX) suppository 10 mg, 10 mg, Rectal, Daily PRN, Agbata, Tochukwu, MD, 10 mg at 03/17/20 1436 .  calcium-vitamin D (OSCAL WITH D) 500-200 MG-UNIT per tablet 1 tablet, 1 tablet, Oral, Daily, Agbata, Tochukwu, MD, 1 tablet at 03/23/20 0952 .  cefTRIAXone (ROCEPHIN) 2 g in sodium chloride 0.9 % 100 mL IVPB, 2 g, Intravenous, Q24H, Fritzi Mandes, MD, Last Rate: 200 mL/hr at 03/23/20 1339, 2 g at 03/23/20 1339 .  Chlorhexidine Gluconate Cloth 2 % PADS 6 each, 6 each, Topical, Daily, Fritzi Mandes, MD, 6 each at 03/22/20 1028 .  cholecalciferol (VITAMIN D) tablet 1,000 Units, 1,000 Units, Oral, Daily, Agbata, Tochukwu, MD, 1,000 Units at 03/23/20 0951 .  docusate sodium (COLACE) capsule 100 mg, 100 mg, Oral, BID, Agbata, Tochukwu, MD, 100 mg at 03/23/20 0952 .  feeding supplement (ENSURE ENLIVE / ENSURE PLUS) liquid 237 mL, 237 mL, Oral, TID BM, Fritzi Mandes, MD, 237 mL at 03/23/20 1001 .  finasteride (PROSCAR) tablet 5 mg, 5 mg, Oral, Daily, Agbata, Tochukwu, MD, 5 mg at 03/23/20 0952 .  fluticasone (FLONASE) 50 MCG/ACT nasal spray 2 spray, 2 spray, Each Nare, Daily PRN, Agbata, Tochukwu, MD .  methocarbamol (ROBAXIN) tablet 500 mg, 500 mg, Oral, Q6H PRN, Agbata, Tochukwu, MD, 500 mg at 03/22/20 2138 .  methylPREDNISolone sodium succinate (SOLU-MEDROL) 40 mg/mL injection 40 mg, 40 mg, Intravenous, BID, Leslye Peer, Richard, MD, 40 mg at 03/23/20 1335 .  multivitamin with minerals tablet 1 tablet, 1 tablet, Oral, Daily, Fritzi Mandes, MD, 1 tablet at 03/23/20 [id number] .  ondansetron (ZOFRAN) tablet 4 mg, 4 mg, Oral, Q6H PRN **OR** ondansetron (ZOFRAN) injection 4 mg, 4 mg, Intravenous, Q6H PRN, Agbata, Tochukwu, MD, 4 mg at 03/20/20 0742 .  pantoprazole (PROTONIX) EC tablet 40 mg, 40 mg, Oral, Daily, Agbata, Tochukwu, MD, 40 mg  at 03/23/20 0952 .  polyethylene glycol (MIRALAX / GLYCOLAX) packet 17 g, 17 g, Oral, QHS, Agbata, Tochukwu, MD, 17 g at 03/21/20 2144 .  polyvinyl alcohol (LIQUIFILM TEARS) 1.4 % ophthalmic solution 1 drop, 1 drop, Both Eyes, PRN, Agbata, Tochukwu, MD .  potassium & sodium phosphates (PHOS-NAK) 280-160-250 MG packet 1 packet, 1 packet, Oral, TID WC & HS, Benita Gutter, RPH, 1 packet at 03/23/20 1143 .  QUEtiapine (SEROQUEL) tablet 25 mg, 25 mg, Oral, QHS, [place], Bing Neighbors, NP, 25 mg at 03/22/20 2115 .  timolol (TIMOPTIC) 0.5 % ophthalmic solution 1 drop, 1 drop, Both Eyes, BID, Agbata, Tochukwu, MD, 1 drop at 03/23/20 0953    ALLERGIES   Patient has no known allergies.    REVIEW OF SYSTEMS     10 point ROS done and is negative except for dyspnea  PHYSICAL EXAMINATION   Vitals:  03/23/20 1300 03/23/20 1454  BP: (!) 129/99   Pulse: (!) 109   Resp: (!) 32   Temp:    SpO2: 94% 94%    GENERAL:age appropirate HEAD: Normocephalic, atraumatic.  EYES: Pupils equal, round, reactive to light.  No scleral icterus.  MOUTH: Moist mucosal membrane. NECK: Supple. No thyromegaly. No nodules. No JVD.  PULMONARY: bilateral rhonchi CARDIOVASCULAR: S1 and S2. Regular rate and rhythm. No murmurs, rubs, or gallops.  GASTROINTESTINAL: Soft, nontender, non-distended. No masses. Positive bowel sounds. No hepatosplenomegaly.  MUSCULOSKELETAL: No swelling, clubbing, or edema.  NEUROLOGIC: Mild distress due to acute illness SKIN:intact,warm,dry   PERTINENT DATA      Lines / Drains: PIV x 2  Cultures / Sepsis markers: -Blood cultures -negative x 4d -COIVID -negative -FLUa&b - negative -RSV- negative -repeat COVID - 12/2- negative  Antibiotics Rocephin and Zithromax - started 11/29   Protocols / Consultants: Cardio, pccm, hospitalist, palliative  Tests / Events: Thoracentesis - 11/29 US abdomen -11/29 -RVP-11/29 -respiratory culture -11/29     Infusions: .  cefTRIAXone (ROCEPHIN)  IV 2 g (03/23/20 1339)   Scheduled Medications: . amiodarone  400 mg Oral BID  . apixaban  5 mg Oral BID  . azithromycin  500 mg Oral Daily  . calcium-vitamin D  1 tablet Oral Daily  . Chlorhexidine Gluconate Cloth  6 each Topical Daily  . cholecalciferol  1,000 Units Oral Daily  . docusate sodium  100 mg Oral BID  . feeding supplement  237 mL Oral TID BM  . finasteride  5 mg Oral Daily  . methylPREDNISolone (SOLU-MEDROL) injection  40 mg Intravenous BID  . multivitamin with minerals  1 tablet Oral Daily  . pantoprazole  40 mg Oral Daily  . polyethylene glycol  17 g Oral QHS  . potassium & sodium phosphates  1 packet Oral TID WC & HS  . QUEtiapine  25 mg Oral QHS  . timolol  1 drop Both Eyes BID   PRN Medications: acetaminophen **OR** acetaminophen, alum & mag hydroxide-simeth, bisacodyl, fluticasone, methocarbamol, ondansetron **OR** ondansetron (ZOFRAN) IV, polyvinyl alcohol Hemodynamic parameters:   Intake/Output: 12/01 0701 - 12/02 0700 In: -  Out: 1001 [Urine:1000; Stool:1]  Ventilator  Settings: FiO2 (%):  [65 %-100 %] 100 %   Other Labs:    LAB RESULTS:  Basic Metabolic Panel: Recent Labs  Lab 03/18/20 0205 03/18/20 0205 03/19/20 5400 03/19/20 8676 03/20/20 0531 03/20/20 0531 03/21/20 0533 03/21/20 0533 03/22/20 0612 03/23/20 0654  NA 134*  --  134*  --   --   --  134*  --  134* 132*  K 3.0*   < > 3.1*   < > 3.7   < > 4.1   < > 3.7 3.9  CL 93*  --  93*  --   --   --  93*  --  93* 93*  CO2 28  --  28  --   --   --  29  --  32 31  GLUCOSE 140*  --  128*  --   --   --  121*  --  142* 119*  BUN 34*  --  45*  --   --   --  60*  --  59* 49*  CREATININE 1.12  --  1.17  --   --   --  1.70*  --  1.50* 1.38*  CALCIUM 8.9  --  8.6*  --   --   --  8.6*  --  8.5* 8.7*  MG  --   --   --   --  2.0  --  2.0  --  2.1 2.2  PHOS  --   --   --   --   --   --  2.7  --  2.5 2.1*   < > = values in this interval not displayed.   Liver Function  Tests: Recent Labs  Lab 03/20/20 0716 03/21/20 0533 03/23/20 0654  AST 48*  --  46*  ALT 34  --  30  ALKPHOS 93  --  107  BILITOT 2.0*  --  1.7*  PROT 5.7*  --  5.7*  ALBUMIN 2.7* 2.8* 2.5*   No results for input(s): LIPASE, AMYLASE in the last 168 hours. No results for input(s): AMMONIA in the last 168 hours. CBC: Recent Labs  Lab 03/19/20 0512 03/20/20 0531 03/21/20 0533 03/22/20 0612 03/23/20 0654  WBC 15.8* 12.9* 17.4* 15.0* 15.0*  HGB 9.6* 8.9*  8.9* 9.4* 9.2* 9.7*  HCT 29.0* 29.1* 30.0* 28.7* 30.1*  MCV 98.0 101.4* 100.0 99.3 99.7  PLT 225 209 188 183 165   Cardiac Enzymes: No results for input(s): CKTOTAL, CKMB, CKMBINDEX, TROPONINI in the last 168 hours. BNP: Invalid input(s): POCBNP CBG: No results for input(s): GLUCAP in the last 168 hours.   IMAGING RESULTS:  Imaging: DG Chest Port 1 View  Result Date: 03/23/2020 CLINICAL DATA:  Follow-up for abnormal chest radiograph. Respiratory distress. History of COPD and hypertension. EXAM: PORTABLE CHEST 1 VIEW COMPARISON:  03/21/2020 and older exams. FINDINGS: Cardiac silhouette is mildly enlarged but stable. Bilateral interstitial and airspace lung opacities are unchanged. Bilateral pleural effusions, more apparent on the left, also stable. No pneumothorax. IMPRESSION: 1. No change in lung aeration since the most recent prior exam. Findings may reflect pulmonary edema due to congestive heart failure, multifocal infection or a combination. Electronically Signed   By: Lajean Manes M.D.   On: 03/23/2020 10:14      ASSESSMENT AND PLAN    -Multidisciplinary rounds held today  Acute Hypoxic Respiratory Failure -due to acute decompensated systolic CHF with EF <16% -complicated by pneumonia of yet unclear etiology, bilateral atelectasis and bilateral pleural effusions -holding diuretic for now due to AKI -thoracentesis - IR consult -s/p right pleural space tap 11/29 and left thoracentesis on 11/30 -RVP-negative    -resp culture-abundant GPCs  -empiric CAP regimen -RHC plan per cardiology - appreciate input   New onset Atrial fibrillation   -TSH is normal  - possible due to infection/sepsis vs worsening heart failure  -on heparin gtt  - on amio and digoxin  -cardiology on case - appreciate input   Chronic COPD  - no phelgm production on examination  - no signs of acute exacerbation on exam    Jaundice   - daughter at bedside also confirms that patient appears orange compared to his normal. Will continue to monitor LFTs and obtain US Liver    ID -continue IV abx as prescibed -follow up cultures  GI/Nutrition GI PROPHYLAXIS as indicated DIET-->TF's as tolerated Constipation protocol as indicated  ENDO - ICU hypoglycemic\Hyperglycemia protocol -check FSBS per protocol   ELECTROLYTES -follow labs as needed -replace as needed -pharmacy consultation   DVT/GI PRX ordered -SCDs  TRANSFUSIONS AS NEEDED MONITOR FSBS ASSESS the need for LABS as needed   Critical care provider statement:    Critical care time (minutes):  33   Critical care time was exclusive of:  Separately billable procedures and  treating other patients   Critical care was necessary to treat or prevent imminent or life-threatening deterioration of the following conditions:  acute hypoxemic respiratory failure, acute decompensated CHF, new onset Atrial fibrillation, multple comorbid conditions   Critical care was time spent personally by me on the following activities:  Development of treatment plan with patient or surrogate, discussions with consultants, evaluation of patient's response to treatment, examination of patient, obtaining history from patient or surrogate, ordering and performing treatments and interventions, ordering and review of laboratory studies and re-evaluation of patient's condition.  I assumed direction of critical care for this patient from another provider in my specialty: no    This document  was prepared using Dragon voice recognition software and may include unintentional dictation errors.    Ottie Glazier, M.D.  Division of [place]

## 2020-03-23 NOTE — Progress Notes (Signed)
Pt refused metaneb at this time stated "I want to sleep" This RT will attampt again later

## 2020-03-23 NOTE — Plan of Care (Signed)
  Problem: Education: Goal: Knowledge of General Education information will improve Description: Including pain rating scale, medication(s)/side effects and non-pharmacologic comfort measures Outcome: Progressing   Problem: Health Behavior/Discharge Planning: Goal: Ability to manage health-related needs will improve Outcome: Progressing   Problem: Clinical Measurements: Goal: Ability to maintain clinical measurements within normal limits will improve Outcome: Progressing Goal: Will remain free from infection Outcome: Progressing Goal: Diagnostic test results will improve Outcome: Progressing Goal: Respiratory complications will improve Outcome: Progressing Goal: Cardiovascular complication will be avoided Outcome: Progressing   Problem: Activity: Goal: Risk for activity intolerance will decrease Outcome: Progressing   Problem: Nutrition: Goal: Adequate nutrition will be maintained Outcome: Progressing   Problem: Coping: Goal: Level of anxiety will decrease Outcome: Progressing   Problem: Elimination: Goal: Will not experience complications related to bowel motility Outcome: Progressing   Problem: Pain Managment: Goal: General experience of comfort will improve Outcome: Progressing   Problem: Safety: Goal: Ability to remain free from injury will improve Outcome: Progressing   Problem: Skin Integrity: Goal: Risk for impaired skin integrity will decrease Outcome: Progressing   Problem: Activity: Goal: Ability to tolerate increased activity will improve Outcome: Progressing   Problem: Clinical Measurements: Goal: Ability to maintain a body temperature in the normal range will improve Outcome: Progressing   Problem: Respiratory: Goal: Ability to maintain adequate ventilation will improve Outcome: Progressing Goal: Ability to maintain a clear airway will improve Outcome: Progressing

## 2020-03-23 NOTE — Progress Notes (Addendum)
Patient ID: Raymond Barnett, male   DOB: November 06, 1929, 86 y.o.   MRN: [id number] [facility] PROGRESS NOTE  Aengus Sauceda Hortman MEQ:[id number] DOB: 1929/06/20 DOA: 03/06/2020 PCP: [name], DO  HPI/Subjective: Patient does not feel great.  Still having shortness of breath and cough.  Still requiring a lot of oxygen.  Admitted on 03/15/2020 with shortness of breath.  Objective: Vitals:   03/23/20 0700 03/23/20 0830  BP: (!) 104/59   Pulse: (!) 110   Resp: (!) 24   Temp:    SpO2: 95% 93%    Intake/Output Summary (Last 24 hours) at 03/23/2020 1323 Last data filed at 03/22/2020 2000 Gross per 24 hour  Intake --  Output 1001 ml  Net -1001 ml   Filed Weights   03/14/2020 0735  Weight: 93 kg    ROS: Review of Systems  Respiratory: Positive for cough and shortness of breath.   Cardiovascular: Negative for chest pain.  Gastrointestinal: Negative for abdominal pain, nausea and vomiting.   Exam: Physical Exam HENT:     Head: Normocephalic.     Mouth/Throat:     Pharynx: No oropharyngeal exudate.  Eyes:     General: Lids are normal.     Conjunctiva/sclera: Conjunctivae normal.     Pupils: Pupils are equal, round, and reactive to light.  Cardiovascular:     Rate and Rhythm: Normal rate. Rhythm irregularly irregular.     Heart sounds: Normal heart sounds, S1 normal and S2 normal.  Pulmonary:     Breath sounds: Examination of the right-middle field reveals decreased breath sounds. Examination of the left-middle field reveals decreased breath sounds. Examination of the right-lower field reveals decreased breath sounds and rhonchi. Examination of the left-lower field reveals decreased breath sounds and rhonchi. Decreased breath sounds and rhonchi present. No wheezing or rales.  Abdominal:     Palpations: Abdomen is soft.     Tenderness: There is no abdominal tenderness.  Musculoskeletal:     Right lower leg: Swelling present.     Left lower leg: Swelling present.   Skin:    General: Skin is warm.     Findings: No rash.  Neurological:     Mental Status: He is alert and oriented to person, place, and time.       Data Reviewed: Basic Metabolic Panel: Recent Labs  Lab 03/18/20 0205 03/18/20 0205 03/19/20 2979 03/20/20 0531 03/21/20 0533 03/22/20 0612 03/23/20 0654  NA 134*  --  134*  --  134* 134* 132*  K 3.0*   < > 3.1* 3.7 4.1 3.7 3.9  CL 93*  --  93*  --  93* 93* 93*  CO2 28  --  28  --  29 32 31  GLUCOSE 140*  --  128*  --  121* 142* 119*  BUN 34*  --  45*  --  60* 59* 49*  CREATININE 1.12  --  1.17  --  1.70* 1.50* 1.38*  CALCIUM 8.9  --  8.6*  --  8.6* 8.5* 8.7*  MG  --   --   --  2.0 2.0 2.1 2.2  PHOS  --   --   --   --  2.7 2.5 2.1*   < > = values in this interval not displayed.   Liver Function Tests: Recent Labs  Lab 03/20/20 0716 03/21/20 0533 03/23/20 0654  AST 48*  --  46*  ALT 34  --  30  ALKPHOS 93  --  107  BILITOT 2.0*  --  1.7*  PROT 5.7*  --  5.7*  ALBUMIN 2.7* 2.8* 2.5*   CBC: Recent Labs  Lab 03/19/20 0512 03/20/20 0531 03/21/20 0533 03/22/20 0612 03/23/20 0654  WBC 15.8* 12.9* 17.4* 15.0* 15.0*  HGB 9.6* 8.9*  8.9* 9.4* 9.2* 9.7*  HCT 29.0* 29.1* 30.0* 28.7* 30.1*  MCV 98.0 101.4* 100.0 99.3 99.7  PLT 225 209 188 183 165   BNP (last 3 results) Recent Labs    03/03/20 0020 03/04/20 0400 03/02/2020 0753  BNP 540.9* 409.8* 1,276.9*     Recent Results (from the past 240 hour(s))  Blood culture (routine x 2)     Status: None   Collection Time: 03/18/2020  7:53 AM   Specimen: BLOOD  Result Value Ref Range Status   Specimen Description BLOOD RIGHT HAND  Final   Special Requests   Final    BOTTLES DRAWN AEROBIC AND ANAEROBIC Blood Culture adequate volume   Culture   Final    NO GROWTH 5 DAYS Performed at [facility], [address] [place], [place] [address]    Report Status 03/21/2020 FINAL  Final  Resp Panel by RT-PCR (Flu A&B, Covid) Nasopharyngeal Swab     Status: None    Collection Time: 02/27/2020  7:58 AM   Specimen: Nasopharyngeal Swab; Nasopharyngeal(NP) swabs in vial transport medium  Result Value Ref Range Status   SARS Coronavirus 2 by RT PCR NEGATIVE NEGATIVE Final    Comment: (NOTE) SARS-CoV-2 target nucleic acids are NOT DETECTED.  The SARS-CoV-2 RNA is generally detectable in upper respiratory specimens during the acute phase of infection. The lowest concentration of SARS-CoV-2 viral copies this assay can detect is 138 copies/mL. A negative result does not preclude SARS-Cov-2 infection and should not be used as the sole basis for treatment or other patient management decisions. A negative result may occur with  improper specimen collection/handling, submission of specimen other than nasopharyngeal swab, presence of viral mutation(s) within the areas targeted by this assay, and inadequate number of viral copies(<138 copies/mL). A negative result must be combined with clinical observations, patient history, and epidemiological information. The expected result is Negative.  Fact Sheet for Patients:  EntrepreneurPulse.com.au  Fact Sheet for Healthcare Providers:  IncredibleEmployment.be  This test is no t yet approved or cleared by the [place] FDA and  has been authorized for detection and/or diagnosis of SARS-CoV-2 by FDA under an Emergency Use Authorization (EUA). This EUA will remain  in effect (meaning this test can be used) for the duration of the COVID-19 declaration under Section 564(b)(1) of the Act, 21 U.S.C.section 360bbb-3(b)(1), unless the authorization is terminated  or revoked sooner.       Influenza A by PCR NEGATIVE NEGATIVE Final   Influenza B by PCR NEGATIVE NEGATIVE Final    Comment: (NOTE) The Xpert Xpress SARS-CoV-2/FLU/RSV plus assay is intended as an aid in the diagnosis of influenza from Nasopharyngeal swab specimens and should not be used as a sole basis for treatment.  Nasal washings and aspirates are unacceptable for Xpert Xpress SARS-CoV-2/FLU/RSV testing.  Fact Sheet for Patients: EntrepreneurPulse.com.au  Fact Sheet for Healthcare Providers: IncredibleEmployment.be  This test is not yet approved or cleared by the [place] FDA and has been authorized for detection and/or diagnosis of SARS-CoV-2 by FDA under an Emergency Use Authorization (EUA). This EUA will remain in effect (meaning this test can be used) for the duration of the COVID-19 declaration under Section 564(b)(1) of the Act, 21 U.S.C.  section 360bbb-3(b)(1), unless the authorization is terminated or revoked.  Performed at [facility], [address] [place], [place] [address]   Blood culture (routine x 2)     Status: None   Collection Time: 03/15/2020  7:58 AM   Specimen: BLOOD  Result Value Ref Range Status   Specimen Description BLOOD LEFT AC  Final   Special Requests   Final    BOTTLES DRAWN AEROBIC AND ANAEROBIC Blood Culture results may not be optimal due to an inadequate volume of blood received in culture bottles   Culture   Final    NO GROWTH 5 DAYS Performed at [facility], [address] [place], [place] [address]    Report Status 03/21/2020 FINAL  Final  MRSA PCR Screening     Status: None   Collection Time: 03/17/20  9:51 AM   Specimen: Nasal Mucosa; Nasopharyngeal  Result Value Ref Range Status   MRSA by PCR NEGATIVE NEGATIVE Final    Comment:        The GeneXpert MRSA Assay (FDA approved for NASAL specimens only), is one component of a comprehensive MRSA colonization surveillance program. It is not intended to diagnose MRSA infection nor to guide or monitor treatment for MRSA infections. Performed at [facility], [address] [place], [place] [address]   Expectorated sputum assessment w rflx to resp cult     Status: None   Collection Time: 03/20/20  2:56 PM   Specimen:  Expectorated Sputum  Result Value Ref Range Status   Specimen Description EXPECTORATED SPUTUM  Final   Special Requests NONE  Final   Sputum evaluation   Final    THIS SPECIMEN IS ACCEPTABLE FOR SPUTUM CULTURE Performed at [facility], [address], [place], [place] [address]    Report Status 03/20/2020 FINAL  Final  Culture, respiratory     Status: None   Collection Time: 03/20/20  2:56 PM  Result Value Ref Range Status   Specimen Description   Final    EXPECTORATED SPUTUM Performed at [facility], [address] [place], [place] [address]    Special Requests   Final    NONE Reflexed from [id number] Performed at [facility], [address] [place], [place] [address]    Gram Stain   Final    NO WBC SEEN FEW SQUAMOUS EPITHELIAL CELLS PRESENT ABUNDANT GRAM POSITIVE COCCI FEW GRAM NEGATIVE RODS FEW GRAM POSITIVE RODS    Culture   Final    Normal respiratory flora-no Staph aureus or Pseudomonas seen Performed at [facility], [place] [address] [place], [place] [address]    Report Status 03/23/2020 FINAL  Final  Body fluid culture     Status: None (Preliminary result)   Collection Time: 03/20/20  4:00 PM   Specimen: PATH Cytology Pleural fluid  Result Value Ref Range Status   Specimen Description   Final    PLEURAL Performed at [facility], [address] [place], [place] [address]    Special Requests   Final    NONE Performed at [facility], [address] [place], [place] [address]    Gram Stain   Final    RARE WBC PRESENT,BOTH PMN AND MONONUCLEAR NO ORGANISMS SEEN    Culture   Final    NO GROWTH 3 DAYS Performed at [facility], [place] [address], [place],  [address]    Report Status PENDING  Incomplete  Acid Fast Smear (  AFB)     Status: None   Collection Time: 03/20/20  4:00 PM   Specimen: PATH Cytology Pleural fluid  Result Value Ref Range Status   AFB Specimen Processing Concentration   Final   Acid Fast Smear Negative  Final    Comment: (NOTE) Performed At: [facility] [place], [place] [id number] Rush Farmer MD DG:[id number]    Source (AFB) PLEURAL  Final    Comment: Performed at [facility], [address] [place], [place] [address]  Body fluid culture     Status: None (Preliminary result)   Collection Time: 03/21/20 11:49 AM   Specimen: PATH Cytology Pleural fluid  Result Value Ref Range Status   Specimen Description   Final    PLEURAL Performed at [facility], [address] [place], [place] [address]    Special Requests   Final    NONE Performed at [facility], [address] [place], [place] [address]    Gram Stain   Final    RARE WBC PRESENT, PREDOMINANTLY MONONUCLEAR NO ORGANISMS SEEN    Culture   Final    NO GROWTH 2 DAYS Performed at [facility], [place] [address] [place],  [address]    Report Status PENDING  Incomplete  Resp Panel by RT-PCR (Flu A&B, Covid) Nasopharyngeal Swab     Status: None   Collection Time: 03/23/20 10:34 AM   Specimen: Nasopharyngeal Swab; Nasopharyngeal(NP) swabs in vial transport medium  Result Value Ref Range Status   SARS Coronavirus 2 by RT PCR NEGATIVE NEGATIVE Final    Comment: (NOTE) SARS-CoV-2 target nucleic acids are NOT DETECTED.  The SARS-CoV-2 RNA is generally detectable in upper respiratory specimens during the acute phase of infection. The lowest concentration of SARS-CoV-2 viral copies this assay can detect is 138 copies/mL. A negative result does not preclude SARS-Cov-2 infection and should not be used as the sole basis for treatment or other patient management decisions. A negative result may occur with  improper specimen collection/handling, submission of specimen other than nasopharyngeal swab, presence of viral mutation(s) within the areas targeted by this assay, and inadequate number of viral copies(<138 copies/mL). A  negative result must be combined with clinical observations, patient history, and epidemiological information. The expected result is Negative.  Fact Sheet for Patients:  EntrepreneurPulse.com.au  Fact Sheet for Healthcare Providers:  IncredibleEmployment.be  This test is no t yet approved or cleared by the [place] FDA and  has been authorized for detection and/or diagnosis of SARS-CoV-2 by FDA under an Emergency Use Authorization (EUA). This EUA will remain  in effect (meaning this test can be used) for the duration of the COVID-19 declaration under Section 564(b)(1) of the Act, 21 U.S.C.section 360bbb-3(b)(1), unless the authorization is terminated  or revoked sooner.       Influenza A by PCR NEGATIVE NEGATIVE Final   Influenza B by PCR NEGATIVE NEGATIVE Final    Comment: (NOTE) The Xpert Xpress SARS-CoV-2/FLU/RSV plus assay is intended as an aid in the diagnosis of influenza from Nasopharyngeal swab specimens and should not be used as a sole basis for treatment. Nasal washings and aspirates are unacceptable for Xpert Xpress SARS-CoV-2/FLU/RSV testing.  Fact Sheet for Patients: EntrepreneurPulse.com.au  Fact Sheet for Healthcare Providers: IncredibleEmployment.be  This test is not yet approved or cleared by the [place] FDA and has been authorized for detection and/or diagnosis of SARS-CoV-2 by FDA under an Emergency Use Authorization (EUA). This EUA will remain  in effect (meaning this test can be used) for the duration of the COVID-19 declaration under Section 564(b)(1) of the Act, 21 U.S.C. section 360bbb-3(b)(1), unless the authorization is terminated or revoked.  Performed at [facility], [address], [place], [place] [address]      Studies: [facility] Chest [place] 1 View  Result Date: 03/23/2020 CLINICAL DATA:  Follow-up for abnormal chest radiograph. Respiratory distress.  History of COPD and hypertension. EXAM: PORTABLE CHEST 1 VIEW COMPARISON:  03/21/2020 and older exams. FINDINGS: Cardiac silhouette is mildly enlarged but stable. Bilateral interstitial and airspace lung opacities are unchanged. Bilateral pleural effusions, more apparent on the left, also stable. No pneumothorax. IMPRESSION: 1. No change in lung aeration since the most recent prior exam. Findings may reflect pulmonary edema due to congestive heart failure, multifocal infection or a combination. Electronically Signed   By: Lajean Manes M.D.   On: 03/23/2020 10:14    Scheduled Meds: . amiodarone  400 mg Oral BID  . apixaban  5 mg Oral BID  . azithromycin  500 mg Oral Daily  . calcium-vitamin D  1 tablet Oral Daily  . Chlorhexidine Gluconate Cloth  6 each Topical Daily  . cholecalciferol  1,000 Units Oral Daily  . docusate sodium  100 mg Oral BID  . feeding supplement  237 mL Oral TID BM  . finasteride  5 mg Oral Daily  . methylPREDNISolone (SOLU-MEDROL) injection  40 mg Intravenous BID  . multivitamin with minerals  1 tablet Oral Daily  . pantoprazole  40 mg Oral Daily  . polyethylene glycol  17 g Oral QHS  . potassium & sodium phosphates  1 packet Oral TID WC & HS  . QUEtiapine  25 mg Oral QHS  . timolol  1 drop Both Eyes BID   Continuous Infusions: . cefTRIAXone (ROCEPHIN)  IV 2 g (03/22/20 1652)    Assessment/Plan:  1. Acute hypoxic respiratory failure secondary to CHF, pneumonia and pleural effusions.  Patient had room air saturation of 82% (less than 90%) on presentation.  Patient's respiratory status has worsened overnight and now on heated high flow nasal cannula 55 L and 100% FiO2.  Will empirically start steroids.  Currently on antibiotics.  Cardiology to decide whether or not to restart Lasix.  Repeat Covid test today negative. 2. Severe sepsis, present on admission with multifocal pneumonia on Rocephin and Zithromax.  Blood cultures and pleural fluid bilaterally negative for  infection.  Sputum culture normal flora 3. Acute on chronic diastolic congestive heart failure with bilateral pleural effusions status post bilateral thoracentesis. 4. Persistent atrial fibrillation on Eliquis for anticoagulation and amiodarone. 5. Acquired thrombophilia secondary to atrial fibrillation.  Eliquis to reduce stroke risk. 6. Acute renal failure.  Creatinine increased by 0.53 (greater than 0.3 mg/dL within 48 hours) from 11/28 to 11/30.  Creatinine better at 1.38 today. 7. Weakness.  Continue physical therapy evaluation    Code Status:     Code Status Orders  (From admission, onward)         Start     Ordered   03/05/2020 1026  Do not attempt resuscitation (DNR)  Continuous       Question Answer Comment  In the event of cardiac or respiratory ARREST Do not call a "code blue"   In the event of cardiac or respiratory ARREST Do not perform Intubation, CPR, defibrillation or ACLS   In the event of cardiac or respiratory ARREST Use medication by any route, position, wound care, and other  measures to relive pain and suffering. May use oxygen, suction and manual treatment of airway obstruction as needed for comfort.      03/15/2020 1029        Code Status History    Date Active Date Inactive Code Status Order ID Comments User Context   03/03/2020 0037 03/07/2020 2337 DNR [id number]  Athena Masse, MD ED   03/02/2020 2336 03/03/2020 0037 Full Code [id number]  Athena Masse, MD ED   Advance Care Planning Activity    Advance Directive Documentation     Most Recent Value  Type of Advance Directive Out of facility DNR (pink MOST or yellow form)  Pre-existing out of facility DNR order (yellow form or pink MOST form) Yellow form placed in chart (order not valid for inpatient use)  "MOST" Form in Place? --     Family Communication: Spoke with patient's daughter on the phone Disposition Plan: Status is: Inpatient  Dispo: The patient is from: Home              Anticipated d/c is  to: Rehab              Anticipated d/c date is: With worsening respiratory status and 100% FiO2 on heated high flow nasal cannula, unable to determine discharge date at this point              Patient currently not medically stable for discharge  Consultants:  Cardiology  Critical care specialist  Antibiotics:  Rocephin  Zithromax  Time spent: 28 minutes, case discussed with critical care specialist and nursing staff.  [place]  [facility] [employer]

## 2020-03-23 NOTE — Progress Notes (Signed)
Patient 3 weeks out from hip hemiarthroplasty.  Wound healed, ok to remove staples and steristrip wound

## 2020-03-23 NOTE — Progress Notes (Signed)
Nutrition Follow-up  DOCUMENTATION CODES:   Not applicable  INTERVENTION:  Continue Ensure Enlive po TID, each supplement provides 350 kcal and 20 grams of protein.  Continue Magic cup TID with meals, each supplement provides 290 kcal and 9 grams of protein.  Continue MVI po daily.  If aligns with patient's goals of care recommend placement of small-bore feeding tube for initiation of tube feeds. Patient asked for time to think about this but reports he is leaning towards declining placement. Discussed with ICU MD. If plan is for initiation of tube feeds recommend:  -Initiate Osmolite 1.5 Cal at 20 ml/hr and advance by 20 mL/hr every 12 hours to goal rate of 60 mL/hr -Provide PROSource TF 45 mL BID per tube -Goal regimen provides 2240 kcal, 112 grams of protein, 1094 mL H2O daily  Monitor magnesium, potassium, and phosphorus daily for at least 3 days, MD to replete as needed, as pt is at risk for refeeding syndrome.  NUTRITION DIAGNOSIS:   Inadequate oral intake related to decreased appetite as evidenced by per patient/family report.  Ongoing.  GOAL:   Patient will meet greater than or equal to 90% of their needs  Not met.  MONITOR:   PO intake, Supplement acceptance, Labs, Weight trends, I & O's, Skin  REASON FOR ASSESSMENT:   Consult Poor PO  ASSESSMENT:   86 year old male with PMHx of arthritis, lumbar degenerative disc disease, glaucoma, HTN, COPD, HFpEF, moderate mitral regurgitation, PAD, recent admission 11/11-11/16 for right fracture of femoral neck s/p right arthroplasty on 11/12 admitted with acute on chronic hypoxic respiratory failure secondary to CHF exacerbation, A-fib with RVR.  Met with patient and his daughter at bedside. Patient continues to have poor PO intake. He is eating only 0-25% of his meals. He reports he does better with liquids than solid foods. He reports difficulty chewing. After discussing options available plan is to downgrade to dysphagia  2 diet for ease of chewing. Daughter also asking about possibility of family paying for someone to come in and help feed patient - discussed this request with MD. Patient agreeable to try to drink more Ensure and eat more Magic Cup. Discussed that he has not been eating well for 7 days now. Patient reports he would like time to think about possibility of feeding tube but is leaning towards declining placement of feeding tube, even with poor PO intake, and would like to just keep doing the best he can do with meals and supplements.  Medications reviewed and include: azithromycin, Oscal with D 1 tablet daily, vitamin D 1000 units daily, Colace 100 mg BID, Solu-Medrol 40 mg Bid IV, MVI daily, Protonix, Miralax, potassium and sodium phosphates BID today, ceftriaxone.  Labs reviewed: Sodium 132, Chloride 93, BUN 49, Creatinine 1.38, Phosphorus 2.1.  I/O: 1000 mL UOP Yesterday  Diet Order:   Diet Order            Diet Heart Room service appropriate? Yes; Fluid consistency: Thin  Diet effective now                EDUCATION NEEDS:   No education needs have been identified at this time  Skin:  Skin Assessment: Skin Integrity Issues: Skin Integrity Issues:: Incisions, Other (Comment) Incisions: closed incision right hip Other: wound right ankle (2cm x 2cm)  Last BM:  Unknown  Height:   Ht Readings from Last 1 Encounters:  03/19/2020 '5\' 10"'  (1.778 m)   Weight:   Wt Readings from Last 1 Encounters:  03/19/2020 93 kg   BMI:  Body mass index is 29.41 kg/m.  Estimated Nutritional Needs:   Kcal:  2200-2400  Protein:  110-120 grams  Fluid:  2 L/day  Jacklynn Barnacle, MS, RD, LDN Pager number available on Amion

## 2020-03-23 NOTE — Progress Notes (Signed)
Physical Therapy Treatment Patient Details Name: Raymond Barnett MRN: [id number] DOB: Jun 13, 1929 Today's Date: 03/23/2020    History of Present Illness Pt admitted 2/2 SOB and hypoxia from [facility]. Found to hace CHF with acute hypoxic respiratory failure. Pt was recently d/c on 03/07/20 following operative repair of R hip fx (s/p R hip hemiarthroplasty). He has PMHx significant for HTN, DDD, and arthritis.    PT Comments    Pt initially refused therapy due to fatigue but when exercises initiated he did not refuse.  Participated in exercises as described below.  Pt putting little effort into assisting and appears generally fatigued.  OOB activities deferred due to motivation and lack of assist for pt and staff safety.   Follow Up Recommendations  SNF;Supervision for mobility/OOB     Equipment Recommendations  Other (comment) (TBD at next venue of care)    Recommendations for Other Services       Precautions / Restrictions Precautions Precautions: Fall;Anterior Hip Restrictions Weight Bearing Restrictions: Yes RLE Weight Bearing: Weight bearing as tolerated Other Position/Activity Restrictions: Anterior R hip hemiarthroplasty 03/03/20 WBAT per prior admission notes    Mobility  Bed Mobility               General bed mobility comments: refused  Transfers                    Ambulation/Gait                 Stairs             Wheelchair Mobility    Modified Rankin (Stroke Patients Only)       Balance Overall balance assessment: Needs assistance Sitting-balance support: Bilateral upper extremity supported Sitting balance-Leahy Scale: Good     Standing balance support: Bilateral upper extremity supported;During functional activity Standing balance-Leahy Scale: Fair Standing balance comment: Heavy lean on the RW for support                            Cognition Arousal/Alertness: Awake/alert Behavior During Therapy:  WFL for tasks assessed/performed Overall Cognitive Status: Within Functional Limits for tasks assessed                                        Exercises Total Joint Exercises Ankle Circles/Pumps: AROM;Strengthening;Left;10 reps Quad Sets: Strengthening;Both;10 reps Gluteal Sets: Strengthening;Both;10 reps Heel Slides: AAROM;Strengthening;Both;5 reps Hip ABduction/ADduction: AAROM;Strengthening;Both;10 reps Straight Leg Raises: AAROM;Strengthening;Both;10 reps Long Arc Quad: AROM;Strengthening;Both;10 reps    General Comments        Pertinent Vitals/Pain Pain Assessment: No/denies pain    Home Living                      Prior Function            PT Goals (current goals can now be found in the care plan section) Progress towards PT goals: Not progressing toward goals - comment    Frequency    Min 2X/week      PT Plan Current plan remains appropriate    Co-evaluation              AM-PAC PT "6 Clicks" Mobility   Outcome Measure  Help needed turning from your back to your side while in a flat bed without using bedrails?: A Lot Help needed moving from lying  on your back to sitting on the side of a flat bed without using bedrails?: A Lot Help needed moving to and from a bed to a chair (including a wheelchair)?: Total Help needed standing up from a chair using your arms (e.g., wheelchair or bedside chair)?: A Lot Help needed to walk in hospital room?: Total Help needed climbing 3-5 steps with a railing? : Total 6 Click Score: 9    End of Session Equipment Utilized During Treatment: Oxygen;Gait belt Activity Tolerance: Treatment limited secondary to medical complications (Comment) (SpO2 down to 78-79% after limited amb) Patient left: in bed;with nursing/sitter in room;with call bell/phone within reach Nurse Communication: Mobility status;Other (comment) (Nurse present during session and aware of pt SpO2 response to activity) PT Visit  Diagnosis: Other abnormalities of gait and mobility (R26.89);Muscle weakness (generalized) (M62.81)     Time: 5465-0354 PT Time Calculation (min) (ACUTE ONLY): 17 min  Charges:  $Therapeutic Exercise: 8-22 mins                    Chesley Noon, PTA 03/23/20, 10:56 AM

## 2020-03-23 NOTE — Progress Notes (Addendum)
Progress Note  Patient Name: Raymond Barnett Date of Encounter: 03/23/2020  Primary Cardiologist: Nelva Bush, MD   Subjective   Feels tired this AM. States he did not sleep well but also in general doesn't feel refreshed after sleeping. States he does not have much of an appetite this AM.  Denies any improvement in his breathing. States he feels about the same. Coughing this AM.   Has not noticed the swelling in his ankles, noted on exam today.  No chest pain.  Inpatient Medications    Scheduled Meds: . amiodarone  400 mg Oral BID  . apixaban  2.5 mg Oral BID  . azithromycin  500 mg Oral Daily  . calcium-vitamin D  1 tablet Oral Daily  . Chlorhexidine Gluconate Cloth  6 each Topical Daily  . cholecalciferol  1,000 Units Oral Daily  . docusate sodium  100 mg Oral BID  . feeding supplement  237 mL Oral TID BM  . finasteride  5 mg Oral Daily  . multivitamin with minerals  1 tablet Oral Daily  . pantoprazole  40 mg Oral Daily  . polyethylene glycol  17 g Oral QHS  . QUEtiapine  25 mg Oral QHS  . timolol  1 drop Both Eyes BID   Continuous Infusions: . cefTRIAXone (ROCEPHIN)  IV 2 g (03/22/20 1652)   PRN Meds: acetaminophen **OR** acetaminophen, alum & mag hydroxide-simeth, bisacodyl, fluticasone, methocarbamol, ondansetron **OR** ondansetron (ZOFRAN) IV, polyvinyl alcohol   Vital Signs    Vitals:   03/23/20 0050 03/23/20 0100 03/23/20 0234 03/23/20 0700  BP:    (!) 104/59  Pulse:    (!) 110  Resp:    (!) 24  Temp:      TempSrc:      SpO2: (S) (!) 55% (S) (!) 84% (!) 87% 95%  Weight:      Height:        Intake/Output Summary (Last 24 hours) at 03/23/2020 0759 Last data filed at 03/22/2020 2000 Gross per 24 hour  Intake --  Output 1001 ml  Net -1001 ml   Last 3 Weights 03/01/2020 03/06/2020 03/05/2020  Weight (lbs) 205 lb 225 lb 12 oz 222 lb 7.1 oz  Weight (kg) 92.987 kg 102.4 kg 100.9 kg      Telemetry    Atrial fibrillation, ventricular rates 90s  to 110s, PVCs- Personally Reviewed  ECG    No new tracings- Personally Reviewed  Physical Exam   GEN: No acute distress.   Neck: CVP ~10cm. HFNC in place. Cardiac:  IRIR and tachycardic, no murmurs, rubs, or gallops.  Respiratory: Bilateral reduced breath sounds, worse at the bases. GI: Soft, nontender, non-distended  MS: moderate to 1+ bilateral edema; No deformity. Neuro:  Nonfocal  Psych: Normal affect   Labs    High Sensitivity Troponin:   Recent Labs  Lab 03/03/20 0020 02/25/2020 0753 03/04/2020 1046 03/10/2020 1242  TROPONINIHS 49* 159* 244* 294*      Chemistry Recent Labs  Lab 03/07/2020 0920 03/17/20 7858 03/19/20 0512 03/20/20 0716 03/21/20 0533 03/22/20 0612 03/23/20 0654  NA 132*   < >   < >  --  134* 134* 132*  K 4.2   < >   < >  --  4.1 3.7 3.9  CL 94*   < >   < >  --  93* 93* 93*  CO2 26   < >   < >  --  29 32 31  GLUCOSE 128*   < >   < >  --  121* 142* 119*  BUN 18   < >   < >  --  60* 59* 49*  CREATININE 0.82   < >   < >  --  1.70* 1.50* 1.38*  CALCIUM 9.0   < >   < >  --  8.6* 8.5* 8.7*  PROT 6.3*  --   --  5.7*  --   --  5.7*  ALBUMIN 3.1*  --   --  2.7* 2.8*  --  2.5*  AST 57*  --   --  48*  --   --  46*  ALT 35  --   --  34  --   --  30  ALKPHOS 88  --   --  93  --   --  107  BILITOT 3.2*  --   --  2.0*  --   --  1.7*  GFRNONAA >60   < >   < >  --  38* 45* 49*  ANIONGAP 12   < >   < >  --  12 9 8    < > = values in this interval not displayed.     Hematology Recent Labs  Lab 03/21/20 0533 03/22/20 0612 03/23/20 0654  WBC 17.4* 15.0* 15.0*  RBC 3.00* 2.89* 3.02*  HGB 9.4* 9.2* 9.7*  HCT 30.0* 28.7* 30.1*  MCV 100.0 99.3 99.7  MCH 31.3 31.8 32.1  MCHC 31.3 32.1 32.2  RDW 23.7* 23.9* 24.4*  PLT 188 183 165    BNP No results for input(s): BNP, PROBNP in the last 168 hours.   DDimer No results for input(s): DDIMER in the last 168 hours.   Radiology    DG Chest Port 1 View  Result Date: 03/21/2020 CLINICAL DATA:  Post LEFT  thoracentesis EXAM: PORTABLE CHEST 1 VIEW COMPARISON:  Portable exam 1155 hours compared to 0855 hours FINDINGS: Enlargement of cardiac silhouette. Diffuse BILATERAL pulmonary infiltrates. Decreased LEFT pleural effusion post thoracentesis. No pneumothorax or acute osseous findings. IMPRESSION: No pneumothorax following LEFT thoracentesis. Persistent diffuse BILATERAL pulmonary infiltrates Electronically Signed   By: Lavonia Dana M.D.   On: 03/21/2020 12:10   DG Chest Port 1 View  Result Date: 03/21/2020 CLINICAL DATA:  Acute on chronic hypoxic respiratory failure, CHF EXAM: PORTABLE CHEST 1 VIEW COMPARISON:  Chest radiograph from one day prior. FINDINGS: Stable cardiomediastinal silhouette with mild cardiomegaly. No pneumothorax. Small bilateral pleural effusions, left greater than right, mildly increased. Diffuse patchy hazy opacities throughout both lungs, slightly worsened. IMPRESSION: 1. Mild cardiomegaly. 2. Small bilateral pleural effusions, left greater than right, mildly increased. 3. Diffuse patchy hazy opacities throughout both lungs, slightly worsened, differential includes cardiogenic pulmonary edema, multifocal pneumonia or ARDS. Electronically Signed   By: Ilona Sorrel M.D.   On: 03/21/2020 09:04   US Abdomen Limited RUQ (LIVER/GB)  Result Date: 03/21/2020 CLINICAL DATA:  Hyperbilirubinemia. EXAM: ULTRASOUND ABDOMEN LIMITED RIGHT UPPER QUADRANT COMPARISON:  CT abdomen pelvis 03/03/2020 FINDINGS: Gallbladder: Dense shadowing throughout the gallbladder compatible with calcification. There is peripheral calcification in the gallbladder fundus on the prior CT. This is presumably due to calcified gallstones although gallbladder wall calcification could have a similar appearance. Negative sonographic Murphy sign. Common bile duct: Diameter: 2.5 mm Liver: Increased echogenicity liver diffusely without focal liver lesion. Portal vein is patent on color Doppler imaging with normal direction of blood  flow towards the liver. Other: None. IMPRESSION: Extensive shadowing due to gallbladder calcification. This is presumably a large gallstone although gallbladder  wall calcification could also be present. This limits evaluation of the gallbladder lumen. No biliary dilatation Hyperechoic liver suggesting fatty infiltration. Electronically Signed   By: Franchot Gallo M.D.   On: 03/21/2020 09:26   US THORACENTESIS ASP PLEURAL SPACE W/IMG GUIDE  Result Date: 03/21/2020 INDICATION: Patient with history of CHF, COPD, respiratory failure ,atrial fibrillation, bilateral pleural effusions; status post right thoracentesis on 03/20/20; request now received for diagnostic and therapeutic left thoracentesis. EXAM: ULTRASOUND GUIDED DIAGNOSTIC THERAPEUTIC LEFT THORACENTESIS MEDICATIONS: 1% lidocaine to skin and subcutaneous tissue COMPLICATIONS: None immediate. PROCEDURE: An ultrasound guided thoracentesis was thoroughly discussed with the patient and questions answered. The benefits, risks, alternatives and complications were also discussed. The patient understands and wishes to proceed with the procedure. Written consent was obtained. Ultrasound was performed to localize and mark an adequate pocket of fluid in the left chest. The area was then prepped and draped in the normal sterile fashion. 1% Lidocaine was used for local anesthesia. Under ultrasound guidance a 6 Fr Safe-T-Centesis catheter was introduced. Thoracentesis was performed. The catheter was removed and a dressing applied. FINDINGS: A total of approximately 550 cc of yellow fluid was removed. Samples were sent to the laboratory as requested by the clinical team. IMPRESSION: Successful ultrasound guided diagnostic and therapeutic left thoracentesis yielding 550 cc of pleural fluid. Read by: Rowe Robert, PA-C Electronically Signed   By: Markus Daft M.D.   On: 03/21/2020 11:52    Cardiac Studies   Echo 03/17/20 1. Left ventricular ejection fraction, by  estimation, is 50 to 55%. The  left ventricle has low normal function. The left ventricle has no regional  wall motion abnormalities. There is moderate left ventricular hypertrophy.  Left ventricular diastolic  function could not be evaluated.  2. Right ventricular systolic function is low normal. The right  ventricular size is mildly enlarged.  3. Left atrial size was mildly dilated.  4. Right atrial size was moderately dilated.  5. The mitral valve is degenerative. Trivial mitral valve regurgitation.  6. The aortic valve is tricuspid. Aortic valve regurgitation is mild.   Echo 11/11/2019 1. Left ventricular ejection fraction, by estimation, is 55 to 60%. The  left ventricle has normal function. The left ventricle has no regional  wall motion abnormalities. There is mild left ventricular hypertrophy.  Left ventricular diastolic parameters  are consistent with Grade II diastolic dysfunction (pseudonormalization).  2. Right ventricular systolic function is normal. The right ventricular  size is normal. There is moderately elevated pulmonary artery systolic  pressure. The estimated right ventricular systolic pressure is 54.5 mmHg.  3. Left atrial size was moderately dilated.  4. Moderate mitral valve regurgitation.  5. There is mild dilatation of the ascending aorta measuring 41 mm.   Patient Profile     86 y.o. male hx of chronic dyspnea, heart failure with improved ejection fraction (55-60 10/2019), peripheral arterial disease with abnormal right lower extremity ABIs, remote osteomyelitis, COPD, thrombocytopenia, BPH, arthritis, prior right TKA, hypertension, and who is being seen today for the evaluation of acute respiratory failure with hypoxia secondary to combination of HFpEF, pna, and complicated by Afib with RVR (new).   Assessment & Plan    Newly diagnosed Paroxysmal Afib with RVR --Rate control has been limited by BP. Current ventricular rate 90-110. Goal ventricular  rate below 110bpm. CHA2DS2VASc score of at least 5 (CHF, HTN, agex2, vascular) with recommendation for indefinite anticoagulation.  Continue amiodarone 400mg  BID for rate control.   Ideally, wean as tolerated in  the future and once able. Continue for now.  Increased back to Eliquis 5mg  BID today given Cr below 1.5.   No current plan for inpatient DCCV.  DCCV could be considered if remains in Afib and rates difficult to control. At this time, however, breathing status not ideal for cardioversion / sedation.   Acute on chronic HFpEF --Admitted 11/25 with respiratory failure in setting of PNA and COPD with volume overload. SOB/DOE improved with restart of diuresis, later held due to AKI and as he was on the dry side. SOB likely multifactorial in the setting of PNA, COPD, Afib with RVR, and underlying pulmonary process. Echo this admission with EF 50 to 55%.   Volume up on exam when compared with yesterday.  IV lasix discontinued earlier in the admission due to AKI.  Restart gentle diuresis with lasix 20mg  daily as tolerated by renal function.  Daily BMET. Cr 1.50  1.38; BUN 59  49. Replete electrolytes. K goal 4.0. Mg goal 2.0.  Monitor I's/O's.     Net -1.00L yesterday.  Daily weights.  Wt not well documented. Previously 93kg.  CHF education.  Continue to treat PNA with abx, thoracentesis as needed for sx relief.  S/p 11/30 L thoracentesis with 550cc yellow fluids. Pathologist smear without malignancy and with recommendation for clinical correlation of inflammatory cells.  Acute on chronic hypoxic respiratory failure/AE COPD/PNA --Continue breathing treatments, abx, thoracentesis as needed.  Elevated high-sensitivity troponin  H/o of coronary artery calcium on CT --No CP. Previously declined outpatient ischemic work-up.  CAC by CT. EKG without acute ST/T changes. HS Tn minimally elevated and flat trending.  Echo this admission 50 to 55% and NRWMA. Not consistent with ACS. No  plan for ischemic evaluation at this time. If chest pain in the outpatient setting, consider further ischemic work-up at that time and once recovered from hip surgery. Continue medical management.   Anemia --Monitor very closely on anticoagulation. --Daily CBC.  PAD --Previously noted reduced right ABIs.   --Not on a statin, per pt preference.   For questions or updates, please contact [place] Please consult [email or web address] for contact info under        Signed, Arvil Chaco, PA-C  03/23/2020, 7:59 AM

## 2020-03-23 NOTE — Progress Notes (Signed)
PHARMACY CONSULT NOTE  Pharmacy Consult for Electrolyte Monitoring and Replacement   Recent Labs: Potassium (mmol/L)  Date Value  03/23/2020 3.9   Magnesium (mg/dL)  Date Value  03/23/2020 2.2   Calcium (mg/dL)  Date Value  03/23/2020 8.7 (L)   Albumin (g/dL)  Date Value  03/23/2020 2.5 (L)  02/16/2015 3.8   Phosphorus (mg/dL)  Date Value  03/23/2020 2.1 (L)   Sodium (mmol/L)  Date Value  03/23/2020 132 (L)  12/29/2019 136    Assessment: 86 year old male with PMHx of arthritis, lumbar degenerative disc disease, glaucoma, HTN, COPD, HFpEF, moderate mitral regurgitation, PAD, recent admission 11/11-11/16 for right fracture of femoral neck s/p right arthroplasty on 11/12 admitted with acute on chronic hypoxic respiratory failure secondary to CHF exacerbation, A-fib with RVR  Diuretics: furosemide 60 mg IV q12h (d/c 11/30 given bump in Scr)  Goal of Therapy:  Potassium 4.0 - 5.1 mmol/L Magnesium 2.0 - 2.4 mg/dL All Other Electrolytes WNL  Plan:   Renal function improved today with diuretics on hold  Phosphorous 2.1, will order Phos-Nak x 2 doses  Re-check electrolytes in AM and replace as needed  Benita Gutter 03/23/2020 8:40 AM

## 2020-03-24 DIAGNOSIS — J90 Pleural effusion, not elsewhere classified: Secondary | ICD-10-CM | POA: Diagnosis not present

## 2020-03-24 DIAGNOSIS — R0603 Acute respiratory distress: Secondary | ICD-10-CM | POA: Diagnosis not present

## 2020-03-24 DIAGNOSIS — I5031 Acute diastolic (congestive) heart failure: Secondary | ICD-10-CM | POA: Diagnosis not present

## 2020-03-24 DIAGNOSIS — N170 Acute kidney failure with tubular necrosis: Secondary | ICD-10-CM | POA: Diagnosis not present

## 2020-03-24 DIAGNOSIS — J9601 Acute respiratory failure with hypoxia: Secondary | ICD-10-CM | POA: Diagnosis not present

## 2020-03-24 DIAGNOSIS — J849 Interstitial pulmonary disease, unspecified: Secondary | ICD-10-CM | POA: Diagnosis not present

## 2020-03-24 DIAGNOSIS — A419 Sepsis, unspecified organism: Secondary | ICD-10-CM | POA: Diagnosis not present

## 2020-03-24 DIAGNOSIS — I5033 Acute on chronic diastolic (congestive) heart failure: Secondary | ICD-10-CM | POA: Diagnosis not present

## 2020-03-24 DIAGNOSIS — I4819 Other persistent atrial fibrillation: Secondary | ICD-10-CM | POA: Diagnosis not present

## 2020-03-24 LAB — BODY FLUID CULTURE
Culture: NO GROWTH
Culture: NO GROWTH

## 2020-03-24 LAB — COMPREHENSIVE METABOLIC PANEL
ALT: 39 U/L (ref 0–44)
AST: 57 U/L — ABNORMAL HIGH (ref 15–41)
Albumin: 2.5 g/dL — ABNORMAL LOW (ref 3.5–5.0)
Alkaline Phosphatase: 121 U/L (ref 38–126)
Anion gap: 11 (ref 5–15)
BUN: 44 mg/dL — ABNORMAL HIGH (ref 8–23)
CO2: 32 mmol/L (ref 22–32)
Calcium: 8.8 mg/dL — ABNORMAL LOW (ref 8.9–10.3)
Chloride: 91 mmol/L — ABNORMAL LOW (ref 98–111)
Creatinine, Ser: 1.14 mg/dL (ref 0.61–1.24)
GFR, Estimated: >60 mL/min (ref >60)
Glucose, Bld: 122 mg/dL — ABNORMAL HIGH (ref 70–99)
Potassium: 4.5 mmol/L (ref 3.5–5.1)
Sodium: 134 mmol/L — ABNORMAL LOW (ref 135–145)
Total Bilirubin: 1.5 mg/dL — ABNORMAL HIGH (ref 0.3–1.2)
Total Protein: 6 g/dL — ABNORMAL LOW (ref 6.5–8.1)

## 2020-03-24 LAB — CBC
HCT: 29.7 % — ABNORMAL LOW (ref 39.0–52.0)
Hemoglobin: 9.5 g/dL — ABNORMAL LOW (ref 13.0–17.0)
MCH: 32.3 pg (ref 26.0–34.0)
MCHC: 32 g/dL (ref 30.0–36.0)
MCV: 101 fL — ABNORMAL HIGH (ref 80.0–100.0)
Platelets: 180 10*3/uL (ref 150–400)
RBC: 2.94 MIL/uL — ABNORMAL LOW (ref 4.22–5.81)
RDW: 24.1 % — ABNORMAL HIGH (ref 11.5–15.5)
WBC: 15.2 10*3/uL — ABNORMAL HIGH (ref 4.0–10.5)
nRBC: 0.7 % — ABNORMAL HIGH (ref 0.0–0.2)

## 2020-03-24 LAB — CHOLESTEROL, BODY FLUID: Cholesterol, Fluid: 18 mg/dL

## 2020-03-24 LAB — MAGNESIUM: Magnesium: 2.4 mg/dL (ref 1.7–2.4)

## 2020-03-24 LAB — PHOSPHORUS: Phosphorus: 2.3 mg/dL — ABNORMAL LOW (ref 2.5–4.6)

## 2020-03-24 LAB — SEDIMENTATION RATE: Sed Rate: 66 mm/hr — ABNORMAL HIGH (ref 0–20)

## 2020-03-24 MED ORDER — MORPHINE SULFATE (PF) 2 MG/ML IV SOLN
1.0000 mg | INTRAVENOUS | Status: DC | PRN
  Administered 2020-03-24 – 2020-03-25 (×8): 1 mg via INTRAVENOUS
  Filled 2020-03-24 (×8): qty 1

## 2020-03-24 MED ORDER — IPRATROPIUM-ALBUTEROL 0.5-2.5 (3) MG/3ML IN SOLN: 3.0000 mL | RESPIRATORY_TRACT | Status: DC | PRN

## 2020-03-24 MED ORDER — MELATONIN 5 MG PO TABS
5.0000 mg | ORAL_TABLET | Freq: Every day | ORAL | Status: DC
  Administered 2020-03-24: 5 mg via ORAL
  Filled 2020-03-24: qty 1

## 2020-03-24 MED ORDER — FUROSEMIDE 10 MG/ML IJ SOLN
20.0000 mg | Freq: Once | INTRAMUSCULAR | Status: AC
  Administered 2020-03-24: 20 mg via INTRAVENOUS
  Filled 2020-03-24: qty 2

## 2020-03-24 MED ORDER — LORAZEPAM 1 MG PO TABS
0.5000 mg | ORAL_TABLET | Freq: Three times a day (TID) | ORAL | Status: DC | PRN
  Administered 2020-03-24: 0.5 mg via ORAL
  Filled 2020-03-24: qty 1

## 2020-03-24 MED ORDER — POTASSIUM & SODIUM PHOSPHATES 280-160-250 MG PO PACK
1.0000 | PACK | Freq: Three times a day (TID) | ORAL | Status: AC
  Administered 2020-03-24: 1 via ORAL
  Filled 2020-03-24: qty 1

## 2020-03-24 MED ORDER — SIMETHICONE 80 MG PO CHEW
80.0000 mg | CHEWABLE_TABLET | Freq: Four times a day (QID) | ORAL | Status: DC
  Administered 2020-03-24 – 2020-03-25 (×5): 80 mg via ORAL
  Filled 2020-03-24 (×9): qty 1

## 2020-03-24 MED ORDER — ZOLPIDEM TARTRATE 5 MG PO TABS: 2.5000 mg | ORAL_TABLET | Freq: Every evening | ORAL | Status: DC | PRN

## 2020-03-24 MED ORDER — TRAZODONE HCL 100 MG PO TABS
100.0000 mg | ORAL_TABLET | Freq: Every evening | ORAL | Status: DC | PRN
  Administered 2020-03-24: 100 mg via ORAL
  Filled 2020-03-24: qty 1

## 2020-03-24 NOTE — Progress Notes (Signed)
PHARMACY CONSULT NOTE  Pharmacy Consult for Electrolyte Monitoring and Replacement   Recent Labs: Potassium (mmol/L)  Date Value  03/24/2020 4.5   Magnesium (mg/dL)  Date Value  03/24/2020 2.4   Calcium (mg/dL)  Date Value  03/24/2020 8.8 (L)   Albumin (g/dL)  Date Value  03/24/2020 2.5 (L)  02/16/2015 3.8   Phosphorus (mg/dL)  Date Value  03/24/2020 2.3 (L)   Sodium (mmol/L)  Date Value  03/24/2020 134 (L)  12/29/2019 136    Assessment: 86 year old male with PMHx of arthritis, lumbar degenerative disc disease, glaucoma, HTN, COPD, HFpEF, moderate mitral regurgitation, PAD, recent admission 11/11-11/16 for right fracture of femoral neck s/p right arthroplasty on 11/12 admitted with acute on chronic hypoxic respiratory failure secondary to CHF exacerbation, A-fib with RVR  Diuretics: furosemide 60 mg IV q12h (d/c 11/30 given bump in Scr)  Goal of Therapy:  Potassium 4.0 - 5.1 mmol/L Magnesium 2.0 - 2.4 mg/dL All Other Electrolytes WNL  Plan:   Renal function continues to improve with diuretics on hold  Phosphorous 2.3, will order Phos-Nak x 1 dose  Re-check electrolytes in AM and replace as needed  Benita Gutter 03/24/2020 8:07 AM

## 2020-03-24 NOTE — Progress Notes (Addendum)
Progress Note  Patient Name: Raymond Barnett Date of Encounter: 03/24/2020  [facility] Cardiologist: Nelva Bush, MD   Subjective   Remains in bed on high flow nasal cannula oxygen Saturations in the high 80s Desaturations with eating and talking Significant cough Started on prednisone yesterday  Long discussion with him, he is depressed/frustrated with minimal improvement in his respiratory status Encouraged him to hang in there No family at the bedside  Telemetry with atrial fibrillation rate low 100s  Inpatient Medications    Scheduled Meds: . amiodarone  400 mg Oral BID  . apixaban  5 mg Oral BID  . calcium-vitamin D  1 tablet Oral Daily  . Chlorhexidine Gluconate Cloth  6 each Topical Daily  . cholecalciferol  1,000 Units Oral Daily  . docusate sodium  100 mg Oral BID  . feeding supplement  237 mL Oral TID BM  . finasteride  5 mg Oral Daily  . melatonin  5 mg Oral QHS  . methylPREDNISolone (SOLU-MEDROL) injection  40 mg Intravenous Q24H  . multivitamin with minerals  1 tablet Oral Daily  . pantoprazole  40 mg Oral Daily  . polyethylene glycol  17 g Oral QHS  . QUEtiapine  25 mg Oral QHS  . simethicone  80 mg Oral QID  . timolol  1 drop Both Eyes BID   Continuous Infusions:  PRN Meds: acetaminophen **OR** acetaminophen, alum & mag hydroxide-simeth, bisacodyl, fluticasone, methocarbamol, ondansetron **OR** ondansetron (ZOFRAN) IV, polyvinyl alcohol, zolpidem   Vital Signs    Vitals:   03/24/20 1200 03/24/20 1300 03/24/20 1339 03/24/20 1400  BP: 102/64 106/62  105/66  Pulse: (!) 105 100  (!) 104  Resp: (!) 29 (!) 29  (!) 32  Temp:  97.6 F (36.4 C)    TempSrc:  Axillary    SpO2: 95% 96% 96% 96%  Weight:      Height:        Intake/Output Summary (Last 24 hours) at 03/24/2020 1545 Last data filed at 03/24/2020 1501 Gross per 24 hour  Intake 437 ml  Output 575 ml  Net -138 ml   Last 3 Weights 03/15/2020 03/06/2020 03/05/2020  Weight (lbs)  205 lb 225 lb 12 oz 222 lb 7.1 oz  Weight (kg) 92.987 kg 102.4 kg 100.9 kg      Telemetry    Atrial fibrillation rate 90-100 - Personally Reviewed  ECG     - Personally Reviewed  Physical Exam   Constitutional:  oriented to person, place, and time.  Moderate respiratory distress HENT:  Head: Grossly normal Eyes:  no discharge. No scleral icterus.  Neck: N0 significant JVD, no carotid bruits  Cardiovascular: Irregularly irregular no murmurs appreciated Pulmonary/Chest: Coarse breath sounds, scattered wheezes, Rales Abdominal: Soft.  no distension.  no tenderness.  Musculoskeletal: Normal range of motion Neurological:  normal muscle tone. Coordination normal. No atrophy Skin: Skin warm and dry Psychiatric: normal affect, pleasant   Labs    High Sensitivity Troponin:   Recent Labs  Lab 03/03/20 0020 02/24/2020 0753 02/21/2020 1046 03/07/2020 1242  TROPONINIHS 49* 159* 244* 294*      Chemistry Recent Labs  Lab 03/20/20 0716 03/20/20 0716 03/21/20 0533 03/21/20 0533 03/22/20 0612 03/23/20 0654 03/24/20 0525  NA  --   --  134*   < > 134* 132* 134*  K  --    < > 4.1   < > 3.7 3.9 4.5  CL  --   --  93*   < >  93* 93* 91*  CO2  --   --  29   < > 32 31 32  GLUCOSE  --   --  121*   < > 142* 119* 122*  BUN  --   --  60*   < > 59* 49* 44*  CREATININE  --   --  1.70*   < > 1.50* 1.38* 1.14  CALCIUM  --   --  8.6*   < > 8.5* 8.7* 8.8*  PROT 5.7*  --   --   --   --  5.7* 6.0*  ALBUMIN 2.7*   < > 2.8*  --   --  2.5* 2.5*  AST 48*  --   --   --   --  46* 57*  ALT 34  --   --   --   --  30 39  ALKPHOS 93  --   --   --   --  107 121  BILITOT 2.0*  --   --   --   --  1.7* 1.5*  GFRNONAA  --   --  38*   < > 45* 49* >60  ANIONGAP  --   --  12   < > 9 8 11    < > = values in this interval not displayed.     Hematology Recent Labs  Lab 03/22/20 0612 03/23/20 0654 03/24/20 0525  WBC 15.0* 15.0* 15.2*  RBC 2.89* 3.02* 2.94*  HGB 9.2* 9.7* 9.5*  HCT 28.7* 30.1* 29.7*  MCV  99.3 99.7 101.0*  MCH 31.8 32.1 32.3  MCHC 32.1 32.2 32.0  RDW 23.9* 24.4* 24.1*  PLT 183 165 180    BNP No results for input(s): BNP, PROBNP in the last 168 hours.   DDimer No results for input(s): DDIMER in the last 168 hours.   Radiology      Cardiac Studies     Patient Profile     86 y.o.malewith history of chronic HFpEF and COPD, whom we have been asked to see due to acute respiratory failure with hypoxia secondary to a combination of acute on chronic HFpEF and pneumonia. Hospital course has been complicated by atrial fibrillation with rapid ventricular response (new).   Assessment & Plan   Paroxysmal atrial fibrillation -Rate controlled on amiodarone, unable to advance beta-blockers or calcium channel blockers secondary to hypotension --Tolerating anticoagulation which was started on admission at the start of his atrial fibrillation -Waiting for opportunity for cardioversion which has been delayed secondary to respiratory distress Would not proceed with cardioversion until hypoxia improves -Amiodarone initiated shortly after atrial fibrillation started an effort to convert to normal sinus rhythm, and has been continued for rate control in the setting of hypertension  Acute on chronic respiratory failure Reported in clinic 1 month ago having worsening shortness of breath -Bilateral pleural effusions noted on CT scan this admission He has had bilateral thoracentesis -Chest x-ray did not improve with diuresis and he had renal dysfunction concerning for hypovolemia and diuretic was held with improvement of his renal function --Could give 1 dose Lasix today as renal function back to normal.  May have limited impact on hypoxia -On antibiotics and steroids.  Concern for viral though no fever Covid negative  Elevated troponin Demand ischemia in the setting of hypoxic respiratory failure, atrial fibrillation No plan for ischemic work-up at this time  Anemia Stable on  anticoagulation For any further drop in hemoglobin could consider transfusion in the setting of respiratory distress,  might help his respiratory status  Discussed with hospital service and nursing  Total encounter time more than 35 minutes  Greater than 50% was spent in counseling and coordination of care with the patient   For questions or updates, please contact [place] Please consult [email or web address] for contact info under        Signed, Ida Rogue, MD  03/24/2020, 3:45 PM

## 2020-03-24 NOTE — Progress Notes (Signed)
Follow up - Critical Care Medicine Note  Patient Details:    Raymond Barnett is an 86 y.o. male known to [place] with tree of COPD (emphysema) and pulmonary fibrosis.  Patient had a right hip fracture noted 03/03/19/2021 patient underwent arthroplasty on 03/03/2020.  Patient was eventually discharged to skilled nursing facility for rehabilitation.  However he presented on 25 November with increasing shortness of breath and cough productive of clear sputum.  Initial evaluation showed bilateral pleural effusions and elevated BNP.  Subsequently the patient has been noted to have bilateral interstitial and airspace disease noted.  Patient is now requiring high flow O2.  He also developed A. fib with RVR.  PCCM consulted to assist with management.   Lines, Airways, Drains: External Urinary Catheter (Active)  Collection Container Dedicated Suction Canister 03/24/20 1945  Securement Method Securing device (Describe) 03/24/20 1945  Site Assessment Clean;Intact 03/24/20 1945  Intervention Equipment Changed 03/24/20 1501  Output (mL) 300 mL 03/24/20 1900    Anti-infectives:  Anti-infectives (From admission, onward)   Start     Dose/Rate Route Frequency Ordered Stop   03/20/20 1415  cefTRIAXone (ROCEPHIN) 1 g in sodium chloride 0.9 % 100 mL IVPB  Status:  Discontinued        1 g 200 mL/hr over 30 Minutes Intravenous Every 24 hours 03/20/20 1322 03/20/20 1329   03/20/20 1415  azithromycin (ZITHROMAX) tablet 500 mg  Status:  Discontinued        500 mg Oral Daily 03/20/20 1322 03/24/20 1033   03/20/20 1415  cefTRIAXone (ROCEPHIN) 2 g in sodium chloride 0.9 % 100 mL IVPB        2 g 200 mL/hr over 30 Minutes Intravenous Every 24 hours 03/20/20 1329 03/24/20 1508   02/26/2020 2200  vancomycin (VANCOREADY) IVPB 1250 mg/250 mL  Status:  Discontinued        1,250 mg 166.7 mL/hr over 90 Minutes Intravenous Every 12 hours 03/01/2020 1029 03/17/20 1152   02/27/2020 1400  ceFEPIme (MAXIPIME) 2 g  in sodium chloride 0.9 % 100 mL IVPB  Status:  Discontinued        2 g 200 mL/hr over 30 Minutes Intravenous Every 8 hours 03/20/2020 1029 03/18/20 1300   03/03/2020 0930  vancomycin (VANCOREADY) IVPB 2000 mg/400 mL        2,000 mg 200 mL/hr over 120 Minutes Intravenous  Once 03/15/2020 0846 03/06/2020 1157   03/07/2020 0900  ceFEPIme (MAXIPIME) 2 g in sodium chloride 0.9 % 100 mL IVPB        2 g 200 mL/hr over 30 Minutes Intravenous  Once 03/02/2020 0846 03/11/2020 0951     Results for orders placed or performed during the hospital encounter of 03/02/2020 (from the past 24 hour(s))  CBC     Status: Abnormal   Collection Time: 03/24/20  5:25 AM  Result Value Ref Range   WBC 15.2 (H) 4.0 - 10.5 K/uL   RBC 2.94 (L) 4.22 - 5.81 MIL/uL   Hemoglobin 9.5 (L) 13.0 - 17.0 g/dL   HCT 29.7 (L) 39 - 52 %   MCV 101.0 (H) 80.0 - 100.0 fL   MCH 32.3 26.0 - 34.0 pg   MCHC 32.0 30.0 - 36.0 g/dL   RDW 24.1 (H) 11.5 - 15.5 %   Platelets 180 150 - 400 K/uL   nRBC 0.7 (H) 0.0 - 0.2 %  Magnesium     Status: None   Collection Time: 03/24/20  5:25 AM  Result Value  Ref Range   Magnesium 2.4 1.7 - 2.4 mg/dL  Phosphorus     Status: Abnormal   Collection Time: 03/24/20  5:25 AM  Result Value Ref Range   Phosphorus 2.3 (L) 2.5 - 4.6 mg/dL  Comprehensive metabolic panel     Status: Abnormal   Collection Time: 03/24/20  5:25 AM  Result Value Ref Range   Sodium 134 (L) 135 - 145 mmol/L   Potassium 4.5 3.5 - 5.1 mmol/L   Chloride 91 (L) 98 - 111 mmol/L   CO2 32 22 - 32 mmol/L   Glucose, Bld 122 (H) 70 - 99 mg/dL   BUN 44 (H) 8 - 23 mg/dL   Creatinine, Ser 1.14 0.61 - 1.24 mg/dL   Calcium 8.8 (L) 8.9 - 10.3 mg/dL   Total Protein 6.0 (L) 6.5 - 8.1 g/dL   Albumin 2.5 (L) 3.5 - 5.0 g/dL   AST 57 (H) 15 - 41 U/L   ALT 39 0 - 44 U/L   Alkaline Phosphatase 121 38 - 126 U/L   Total Bilirubin 1.5 (H) 0.3 - 1.2 mg/dL   GFR, Estimated >60 >60 mL/min   Anion gap 11 5 - 15  Sedimentation rate     Status: Abnormal    Collection Time: 03/24/20  5:25 AM  Result Value Ref Range   Sed Rate 66 (H) 0 - 20 mm/hr    Microbiology: Results for orders placed or performed during the hospital encounter of 02/28/2020  Blood culture (routine x 2)     Status: None   Collection Time: 03/05/2020  7:53 AM   Specimen: BLOOD  Result Value Ref Range Status   Specimen Description BLOOD RIGHT HAND  Final   Special Requests   Final    BOTTLES DRAWN AEROBIC AND ANAEROBIC Blood Culture adequate volume   Culture   Final    NO GROWTH 5 DAYS Performed at [facility], [address] [place], [place] [address]    Report Status 03/21/2020 FINAL  Final  Resp Panel by RT-PCR (Flu A&B, Covid) Nasopharyngeal Swab     Status: None   Collection Time: 02/29/2020  7:58 AM   Specimen: Nasopharyngeal Swab; Nasopharyngeal(NP) swabs in vial transport medium  Result Value Ref Range Status   SARS Coronavirus 2 by RT PCR NEGATIVE NEGATIVE Final    Comment: (NOTE) SARS-CoV-2 target nucleic acids are NOT DETECTED.  The SARS-CoV-2 RNA is generally detectable in upper respiratory specimens during the acute phase of infection. The lowest concentration of SARS-CoV-2 viral copies this assay can detect is 138 copies/mL. A negative result does not preclude SARS-Cov-2 infection and should not be used as the sole basis for treatment or other patient management decisions. A negative result may occur with  improper specimen collection/handling, submission of specimen other than nasopharyngeal swab, presence of viral mutation(s) within the areas targeted by this assay, and inadequate number of viral copies(<138 copies/mL). A negative result must be combined with clinical observations, patient history, and epidemiological information. The expected result is Negative.  Fact Sheet for Patients:  EntrepreneurPulse.com.au  Fact Sheet for Healthcare Providers:  IncredibleEmployment.be  This test is no t yet  approved or cleared by the [place] FDA and  has been authorized for detection and/or diagnosis of SARS-CoV-2 by FDA under an Emergency Use Authorization (EUA). This EUA will remain  in effect (meaning this test can be used) for the duration of the COVID-19 declaration under Section 564(b)(1) of the Act, 21 U.S.C.section 360bbb-3(b)(1), unless the authorization  is terminated  or revoked sooner.       Influenza A by PCR NEGATIVE NEGATIVE Final   Influenza B by PCR NEGATIVE NEGATIVE Final    Comment: (NOTE) The Xpert Xpress SARS-CoV-2/FLU/RSV plus assay is intended as an aid in the diagnosis of influenza from Nasopharyngeal swab specimens and should not be used as a sole basis for treatment. Nasal washings and aspirates are unacceptable for Xpert Xpress SARS-CoV-2/FLU/RSV testing.  Fact Sheet for Patients: EntrepreneurPulse.com.au  Fact Sheet for Healthcare Providers: IncredibleEmployment.be  This test is not yet approved or cleared by the [place] FDA and has been authorized for detection and/or diagnosis of SARS-CoV-2 by FDA under an Emergency Use Authorization (EUA). This EUA will remain in effect (meaning this test can be used) for the duration of the COVID-19 declaration under Section 564(b)(1) of the Act, 21 U.S.C. section 360bbb-3(b)(1), unless the authorization is terminated or revoked.  Performed at [facility], [address] [place], [place] [address]   Blood culture (routine x 2)     Status: None   Collection Time: 03/11/2020  7:58 AM   Specimen: BLOOD  Result Value Ref Range Status   Specimen Description BLOOD LEFT AC  Final   Special Requests   Final    BOTTLES DRAWN AEROBIC AND ANAEROBIC Blood Culture results may not be optimal due to an inadequate volume of blood received in culture bottles   Culture   Final    NO GROWTH 5 DAYS Performed at [facility], [address] [place], [place]  [address]    Report Status 03/21/2020 FINAL  Final  MRSA PCR Screening     Status: None   Collection Time: 03/17/20  9:51 AM   Specimen: Nasal Mucosa; Nasopharyngeal  Result Value Ref Range Status   MRSA by PCR NEGATIVE NEGATIVE Final    Comment:        The GeneXpert MRSA Assay (FDA approved for NASAL specimens only), is one component of a comprehensive MRSA colonization surveillance program. It is not intended to diagnose MRSA infection nor to guide or monitor treatment for MRSA infections. Performed at [facility], [address] [place], [place] [address]   Expectorated sputum assessment w rflx to resp cult     Status: None   Collection Time: 03/20/20  2:56 PM   Specimen: Expectorated Sputum  Result Value Ref Range Status   Specimen Description EXPECTORATED SPUTUM  Final   Special Requests NONE  Final   Sputum evaluation   Final    THIS SPECIMEN IS ACCEPTABLE FOR SPUTUM CULTURE Performed at [facility], [address] [place], [place] [address]    Report Status 03/20/2020 FINAL  Final  Culture, respiratory     Status: None   Collection Time: 03/20/20  2:56 PM  Result Value Ref Range Status   Specimen Description   Final    EXPECTORATED SPUTUM Performed at [facility], [address], [place], [place] [address]    Special Requests   Final    NONE Reflexed from [id number] Performed at [facility], [address] [place], [place] [address]    Gram Stain   Final    NO WBC SEEN FEW SQUAMOUS EPITHELIAL CELLS PRESENT ABUNDANT GRAM POSITIVE COCCI FEW GRAM NEGATIVE RODS FEW GRAM POSITIVE RODS    Culture   Final    Normal respiratory flora-no Staph aureus or Pseudomonas seen Performed at [facility], [place] [address] [place],  [address]  Report Status 03/23/2020 FINAL  Final  Body fluid culture     Status: None   Collection Time: 03/20/20  4:00 PM   Specimen: PATH Cytology Pleural fluid  Result Value Ref Range  Status   Specimen Description   Final    PLEURAL Performed at [facility], [address], [place], [place] [address]    Special Requests   Final    NONE Performed at [facility], [address] [place], [place] [address]    Gram Stain   Final    RARE WBC PRESENT,BOTH PMN AND MONONUCLEAR NO ORGANISMS SEEN    Culture   Final    NO GROWTH 3 DAYS Performed at [facility], [place] [address] [place], [place] [address]    Report Status 03/24/2020 FINAL  Final  Fungus Culture With Stain     Status: None (Preliminary result)   Collection Time: 03/20/20  4:00 PM   Specimen: PATH Cytology Pleural fluid  Result Value Ref Range Status   Fungus Stain Final report  Final    Comment: (NOTE) Performed At: [facility] [place], [place] [id number] Rush Farmer MD [id number]    Fungus (Mycology) Culture PENDING  Incomplete   Fungal Source PLEURAL  Final    Comment: Performed at [facility], [address] [place], [place] [address]  Acid Fast Smear (AFB)     Status: None   Collection Time: 03/20/20  4:00 PM   Specimen: PATH Cytology Pleural fluid  Result Value Ref Range Status   AFB Specimen Processing Concentration  Final   Acid Fast Smear Negative  Final    Comment: (NOTE) Performed At: [facility] [place], [place] [id number] Rush Farmer MD AT:[id number]    Source (AFB) PLEURAL  Final    Comment: Performed at [facility], [address] [place], [place] [address]  Fungus Culture Result     Status: None   Collection Time: 03/20/20  4:00 PM  Result Value Ref Range Status   Result 1 Comment  Final    Comment: (NOTE) KOH/Calcofluor preparation:  no fungus observed. Performed At: [facility] [place], [place] [id number] Rush Farmer MD VO:[id number]   Body fluid culture     Status: None   Collection Time: 03/21/20 11:49 AM   Specimen: PATH Cytology  Pleural fluid  Result Value Ref Range Status   Specimen Description   Final    PLEURAL Performed at [facility], [address] [place], [place] [address]    Special Requests   Final    NONE Performed at [facility], [address] [place], [place] [address]    Gram Stain   Final    RARE WBC PRESENT, PREDOMINANTLY MONONUCLEAR NO ORGANISMS SEEN    Culture   Final    NO GROWTH 3 DAYS Performed at [facility] [address], [place], [place] [address]    Report Status 03/24/2020 FINAL  Final  Fungus Culture With Stain     Status: None (Preliminary result)   Collection Time: 03/21/20 11:49 AM   Specimen: PATH Cytology Pleural fluid  Result Value Ref Range Status   Fungus Stain Final report  Final    Comment: (NOTE) Performed At: [facility] [place], [place] [id number] Rush Farmer MD [id number]    Fungus (Mycology) Culture PENDING  Incomplete   Fungal Source PLEURAL  Final    Comment: Performed  at [facility], [address] [place], [place] [address]  Fungus Culture Result     Status: None   Collection Time: 03/21/20 11:49 AM  Result Value Ref Range Status   Result 1 Comment  Final    Comment: (NOTE) KOH/Calcofluor preparation:  no fungus observed. Performed At: [facility] [place], [place] [id number] Rush Farmer MD [id number]   Resp Panel by RT-PCR (Flu A&B, Covid) Nasopharyngeal Swab     Status: None   Collection Time: 03/23/20 10:34 AM   Specimen: Nasopharyngeal Swab; Nasopharyngeal(NP) swabs in vial transport medium  Result Value Ref Range Status   SARS Coronavirus 2 by RT PCR NEGATIVE NEGATIVE Final    Comment: (NOTE) SARS-CoV-2 target nucleic acids are NOT DETECTED.  The SARS-CoV-2 RNA is generally detectable in upper respiratory specimens during the acute phase of infection. The lowest concentration of SARS-CoV-2 viral copies this assay can detect is 138  copies/mL. A negative result does not preclude SARS-Cov-2 infection and should not be used as the sole basis for treatment or other patient management decisions. A negative result may occur with  improper specimen collection/handling, submission of specimen other than nasopharyngeal swab, presence of viral mutation(s) within the areas targeted by this assay, and inadequate number of viral copies(<138 copies/mL). A negative result must be combined with clinical observations, patient history, and epidemiological information. The expected result is Negative.  Fact Sheet for Patients:  EntrepreneurPulse.com.au  Fact Sheet for Healthcare Providers:  IncredibleEmployment.be  This test is no t yet approved or cleared by the [place] FDA and  has been authorized for detection and/or diagnosis of SARS-CoV-2 by FDA under an Emergency Use Authorization (EUA). This EUA will remain  in effect (meaning this test can be used) for the duration of the COVID-19 declaration under Section 564(b)(1) of the Act, 21 U.S.C.section 360bbb-3(b)(1), unless the authorization is terminated  or revoked sooner.       Influenza A by PCR NEGATIVE NEGATIVE Final   Influenza B by PCR NEGATIVE NEGATIVE Final    Comment: (NOTE) The Xpert Xpress SARS-CoV-2/FLU/RSV plus assay is intended as an aid in the diagnosis of influenza from Nasopharyngeal swab specimens and should not be used as a sole basis for treatment. Nasal washings and aspirates are unacceptable for Xpert Xpress SARS-CoV-2/FLU/RSV testing.  Fact Sheet for Patients: EntrepreneurPulse.com.au  Fact Sheet for Healthcare Providers: IncredibleEmployment.be  This test is not yet approved or cleared by the [place] FDA and has been authorized for detection and/or diagnosis of SARS-CoV-2 by FDA under an Emergency Use Authorization (EUA). This EUA will remain in effect (meaning  this test can be used) for the duration of the COVID-19 declaration under Section 564(b)(1) of the Act, 21 U.S.C. section 360bbb-3(b)(1), unless the authorization is terminated or revoked.  Performed at [facility], [address] [place], [place] [address]     Best Practice/Protocols:  VTE Prophylaxis: Direct Thrombin Inhibitor GI Prophylaxis: Proton Pump Inhibitor  Events: 03/20/20- Met with daughter, reviewed findings and care plan. Discussed short term plan with cardiology and hospitalist team.  03/21/20- s/p thoracentesis on left with 500cc straw colored fluid.  Patient feels slighly improved was smiling and shook my hand during examination.  12/1-patient improved with weaning of FIO2 , today  03/23/20- patient remains with elevated settings on HFNC. I spoke with daughter today.  Seems post booster patient got severe bilateral pneumonia. Will ask ID to evaluate due to little change despite aggressive medical management.  IV  Solu-Medrol started 40 mg daily 03/24/2020: Increasing FiO2 requirements, still dependent on high flow O2.  Discussed at length with family  Studies: DG Chest 2 View  Result Date: 03/02/2020 CLINICAL DATA:  Shortness of breath EXAM: CHEST - 2 VIEW COMPARISON:  October 12, 2019 FINDINGS: There is a small pleural effusion on each side with mild bibasilar atelectasis. The lungs elsewhere are clear. There is cardiomegaly with pulmonary vascularity normal. No adenopathy. There is aortic atherosclerosis. There is leftward deviation of the upper thoracic trachea. Bones appear osteoporotic. IMPRESSION: 1. Small pleural effusions bilaterally with bibasilar atelectasis. Lungs elsewhere clear. 2.  Cardiomegaly with pulmonary vascularity normal. 3. Deviation of the upper thoracic trachea toward the left. Question thyroid enlargement as etiology for this finding. 4.  Aortic Atherosclerosis (ICD10-I70.0). Electronically Signed   By: Lowella Grip III M.D.   On:  03/02/2020 13:03   CT Angio Chest PE W and/or Wo Contrast  Result Date: 03/12/2020 CLINICAL DATA:  Clinical high probability for pulmonary embolus. Shortness of breath. Abdominal distension and jaundice. EXAM: CT ANGIOGRAPHY CHEST CT ABDOMEN AND PELVIS WITH CONTRAST TECHNIQUE: Multidetector CT imaging of the chest was performed using the standard protocol during bolus administration of intravenous contrast. Multiplanar CT image reconstructions and MIPs were obtained to evaluate the vascular anatomy. Multidetector CT imaging of the abdomen and pelvis was performed using the standard protocol during bolus administration of intravenous contrast. CONTRAST:  9m OMNIPAQUE IOHEXOL 350 MG/ML SOLN COMPARISON:  CT chest 05/19/2017 abdominal sonogram 11/12/2011 FINDINGS: CTA CHEST FINDINGS Cardiovascular: Satisfactory opacification of the pulmonary arteries to the segmental level. Respiratory motion artifact however diminishes exam detail within the lower lobe pulmonary arteries at the segmental and subsegmental levels. The main pulmonary artery is patent. No central obstructing embolus. No lobar or segmental pulmonary artery filling defects identified bilaterally. The ascending thoracic aorta appears increased in caliber measuring 4.2 cm. Aortic atherosclerosis and coronary artery calcifications. Mild cardiac enlargement. No pericardial effusion. Mediastinum/Nodes: Normal appearance of the thyroid gland. The trachea appears patent and is midline. Normal appearance of the esophagus. No enlarged axillary, supraclavicular or mediastinal lymph nodes. Prominent prevascular and right paratracheal nodes are identified measuring up to 1.3 cm. Lungs/Pleura: Moderate bilateral pleural effusions are identified. Extensive interstitial and airspace disease is noted within the right upper lobe, left upper lobe, lingula and right middle lobe. Subsegmental atelectasis and volume loss noted within both lower lung zones.  Musculoskeletal: The bones appear diffusely osteopenic. No acute or suspicious osseous findings. Review of the MIP images confirms the above findings. CT ABDOMEN and PELVIS FINDINGS Hepatobiliary: No suspicious liver abnormality. Mural calcifications are identified within the gallbladder fundus, image 27/4. No bile duct dilatation. Pancreas: Unremarkable. No pancreatic ductal dilatation or surrounding inflammatory changes. Spleen: Normal in size without focal abnormality. Adrenals/Urinary Tract: Normal adrenal glands. No hydronephrosis identified bilaterally. Bilateral kidney cysts are identified. The largest arises from the inferior pole of right kidney measuring 8.5 cm. There is a hyper dense lesion arising from the anterior cortex of the upper pole of right kidney measuring 0.7 by 0.9 cm, image 28/4. Additional, subcentimeter kidney lesions are noted bilaterally which are too small to reliably characterize. No hydronephrosis identified bilaterally. No hydroureter or ureteral lithiasis. Stomach/Bowel: Stomach is nondistended. The appendix is visualized and appears normal. No abnormal bowel dilatation, inflammation or distension. Distal colonic diverticula identified without acute inflammation. Moderate stool burden noted throughout the colon Vascular/Lymphatic: Aortic atherosclerosis. No aneurysm. No abdominopelvic adenopathy. Reproductive: Prostate gland is obscured by streak artifact from right  hip arthroplasty. Other: No significant free fluid or fluid collections. Musculoskeletal: Bilateral sacroiliitis noted, right greater than left. Previous right hip arthroplasty. Multi level degenerative disc disease is noted throughout the lumbar spine. This is most advanced at L5-S1. Acute appearing superior endplate deformity is noted at the L2 level. With loss of approximately 15% of the vertebral body height centrally. Review of the MIP images confirms the above findings. IMPRESSION: 1. No evidence for acute pulmonary  embolus. 2. Bilateral pleural effusions with extensive bilateral interstitial and airspace disease identified. Findings are concerning for congestive heart failure. Superimposed multifocal pneumonia not excluded. 3. No acute findings within the abdomen or pelvis. 4. Mild acute appearing superior endplate deformity at L2. 5. There is a hyperdense lesion arising off the anterior cortex of the left kidney which does not meet criteria for simple cyst. This may represent a hemorrhagic cyst or solid enhancing lesion. When the patient is clinically stable and able to follow directions and hold their breath (preferably as an outpatient) further evaluation with dedicated abdominal MRI should be considered. 6. Bilateral kidney cysts. Aortic Atherosclerosis (ICD10-I70.0). Electronically Signed   By: Kerby Moors M.D.   On: 03/01/2020 12:23   CT PELVIS WO CONTRAST  Result Date: 03/02/2020 CLINICAL DATA:  Hip pain for 10 days EXAM: CT PELVIS WITHOUT CONTRAST TECHNIQUE: Multidetector CT imaging of the pelvis was performed following the standard protocol without intravenous contrast. COMPARISON:  Radiograph 03/02/2020 FINDINGS: Urinary Tract:  Urinary bladder is unremarkable. Bowel: Sigmoid colon diverticula without acute inflammatory process. Negative appendix. No bowel wall thickening Vascular/Lymphatic: Extensive aortic atherosclerosis without aneurysm. No suspicious nodes Reproductive:  Mild prostate calcification without enlargement Other:  Negative for pelvic effusion Musculoskeletal: Mild degenerative changes of both hips with joint space narrowing and asymmetric femoral head neck junction. Chronic appearing deformity at the left iliac crest potentially due to remote trauma or postsurgical change. There is no fracture or associated soft tissue mass. There is fatty atrophy of adjacent gluteus musculature. No femoral head dislocation. Coronal images are suspicious for acute appearing nondisplaced right femoral neck  fracture. Pubic symphysis and rami appear intact. IMPRESSION: 1. No definite acute osseous abnormality of left hip. Chronic appearing osseous deformity of left iliac crest without evidence for bony destructive change or associated soft tissue mass. 2. Findings are suspicious for an acute nondisplaced right femoral neck fracture 3. Sigmoid colon diverticular disease without acute inflammatory change. Aortic Atherosclerosis (ICD10-I70.0). Electronically Signed   By: Donavan Foil M.D.   On: 03/02/2020 16:33   MR PELVIS WO CONTRAST  Result Date: 03/02/2020 CLINICAL DATA:  Right hip pain EXAM: MRI PELVIS WITHOUT CONTRAST TECHNIQUE: Multiplanar multisequence MR imaging of the pelvis was performed. No intravenous contrast was administered. COMPARISON:  CT 3:54 p.m., plain radiographs 12:36 p.m. FINDINGS: Urinary Tract: The distal ureters are decompressed. The bladder is unremarkable. Bowel: The visualized large and small bowel are unremarkable per save for severe sigmoid diverticulosis. Vascular/Lymphatic: No pathologic adenopathy within the abdomen and pelvis Reproductive:  No mass or other significant abnormality Other:  None significant Musculoskeletal: There is an acute, impacted subcapital right femoral neck fracture with mild varus angulation of the distal fracture fragment. The femoral head is still seated within the right acetabulum. Iliofemoral in pubic femoral ligaments are intact. Ligamentum teres is intact. There is mild bilateral degenerative hip arthritis. There is injury signal within the right gluteal musculature compatible with mild strain. Similar changes are noted within the left gluteus maximus muscle. There is increased signal compatible with  greater trochanteric bursitis on the right. There is increased signal within the visualized proximal vastus lateralis and intermedius compatible with muscle strain. No frank avulsion. There is irregularity of the left iliac crest without associated marrow  edema or soft tissue mass most in keeping with mod trauma or surgical intervention. IMPRESSION: Acute, impacted, mildly angulated right subcapital femoral neck fracture Increased signal in keeping with muscular strain involving the a right gluteal musculature as well as the vastus lateralis and intermedius. No frank tendinous avulsion. Mild superimposed bilateral hip degenerative arthritis. Electronically Signed   By: Fidela Salisbury MD   On: 03/02/2020 22:36   CT ABDOMEN PELVIS W CONTRAST  Result Date: 03/04/2020 CLINICAL DATA:  Clinical high probability for pulmonary embolus. Shortness of breath. Abdominal distension and jaundice. EXAM: CT ANGIOGRAPHY CHEST CT ABDOMEN AND PELVIS WITH CONTRAST TECHNIQUE: Multidetector CT imaging of the chest was performed using the standard protocol during bolus administration of intravenous contrast. Multiplanar CT image reconstructions and MIPs were obtained to evaluate the vascular anatomy. Multidetector CT imaging of the abdomen and pelvis was performed using the standard protocol during bolus administration of intravenous contrast. CONTRAST:  84m OMNIPAQUE IOHEXOL 350 MG/ML SOLN COMPARISON:  CT chest 05/19/2017 abdominal sonogram 11/12/2011 FINDINGS: CTA CHEST FINDINGS Cardiovascular: Satisfactory opacification of the pulmonary arteries to the segmental level. Respiratory motion artifact however diminishes exam detail within the lower lobe pulmonary arteries at the segmental and subsegmental levels. The main pulmonary artery is patent. No central obstructing embolus. No lobar or segmental pulmonary artery filling defects identified bilaterally. The ascending thoracic aorta appears increased in caliber measuring 4.2 cm. Aortic atherosclerosis and coronary artery calcifications. Mild cardiac enlargement. No pericardial effusion. Mediastinum/Nodes: Normal appearance of the thyroid gland. The trachea appears patent and is midline. Normal appearance of the esophagus. No  enlarged axillary, supraclavicular or mediastinal lymph nodes. Prominent prevascular and right paratracheal nodes are identified measuring up to 1.3 cm. Lungs/Pleura: Moderate bilateral pleural effusions are identified. Extensive interstitial and airspace disease is noted within the right upper lobe, left upper lobe, lingula and right middle lobe. Subsegmental atelectasis and volume loss noted within both lower lung zones. Musculoskeletal: The bones appear diffusely osteopenic. No acute or suspicious osseous findings. Review of the MIP images confirms the above findings. CT ABDOMEN and PELVIS FINDINGS Hepatobiliary: No suspicious liver abnormality. Mural calcifications are identified within the gallbladder fundus, image 27/4. No bile duct dilatation. Pancreas: Unremarkable. No pancreatic ductal dilatation or surrounding inflammatory changes. Spleen: Normal in size without focal abnormality. Adrenals/Urinary Tract: Normal adrenal glands. No hydronephrosis identified bilaterally. Bilateral kidney cysts are identified. The largest arises from the inferior pole of right kidney measuring 8.5 cm. There is a hyper dense lesion arising from the anterior cortex of the upper pole of right kidney measuring 0.7 by 0.9 cm, image 28/4. Additional, subcentimeter kidney lesions are noted bilaterally which are too small to reliably characterize. No hydronephrosis identified bilaterally. No hydroureter or ureteral lithiasis. Stomach/Bowel: Stomach is nondistended. The appendix is visualized and appears normal. No abnormal bowel dilatation, inflammation or distension. Distal colonic diverticula identified without acute inflammation. Moderate stool burden noted throughout the colon Vascular/Lymphatic: Aortic atherosclerosis. No aneurysm. No abdominopelvic adenopathy. Reproductive: Prostate gland is obscured by streak artifact from right hip arthroplasty. Other: No significant free fluid or fluid collections. Musculoskeletal: Bilateral  sacroiliitis noted, right greater than left. Previous right hip arthroplasty. Multi level degenerative disc disease is noted throughout the lumbar spine. This is most advanced at L5-S1. Acute appearing superior endplate deformity is  noted at the L2 level. With loss of approximately 15% of the vertebral body height centrally. Review of the MIP images confirms the above findings. IMPRESSION: 1. No evidence for acute pulmonary embolus. 2. Bilateral pleural effusions with extensive bilateral interstitial and airspace disease identified. Findings are concerning for congestive heart failure. Superimposed multifocal pneumonia not excluded. 3. No acute findings within the abdomen or pelvis. 4. Mild acute appearing superior endplate deformity at L2. 5. There is a hyperdense lesion arising off the anterior cortex of the left kidney which does not meet criteria for simple cyst. This may represent a hemorrhagic cyst or solid enhancing lesion. When the patient is clinically stable and able to follow directions and hold their breath (preferably as an outpatient) further evaluation with dedicated abdominal MRI should be considered. 6. Bilateral kidney cysts. Aortic Atherosclerosis (ICD10-I70.0). Electronically Signed   By: Kerby Moors M.D.   On: 03/17/2020 12:23   DG Chest Port 1 View  Result Date: 03/23/2020 CLINICAL DATA:  Follow-up for abnormal chest radiograph. Respiratory distress. History of COPD and hypertension. EXAM: PORTABLE CHEST 1 VIEW COMPARISON:  03/21/2020 and older exams. FINDINGS: Cardiac silhouette is mildly enlarged but stable. Bilateral interstitial and airspace lung opacities are unchanged. Bilateral pleural effusions, more apparent on the left, also stable. No pneumothorax. IMPRESSION: 1. No change in lung aeration since the most recent prior exam. Findings may reflect pulmonary edema due to congestive heart failure, multifocal infection or a combination. Electronically Signed   By: Lajean Manes M.D.    On: 03/23/2020 10:14   DG Chest Port 1 View  Result Date: 03/21/2020 CLINICAL DATA:  Post LEFT thoracentesis EXAM: PORTABLE CHEST 1 VIEW COMPARISON:  Portable exam 1155 hours compared to 0855 hours FINDINGS: Enlargement of cardiac silhouette. Diffuse BILATERAL pulmonary infiltrates. Decreased LEFT pleural effusion post thoracentesis. No pneumothorax or acute osseous findings. IMPRESSION: No pneumothorax following LEFT thoracentesis. Persistent diffuse BILATERAL pulmonary infiltrates Electronically Signed   By: Lavonia Dana M.D.   On: 03/21/2020 12:10   DG Chest Port 1 View  Result Date: 03/21/2020 CLINICAL DATA:  Acute on chronic hypoxic respiratory failure, CHF EXAM: PORTABLE CHEST 1 VIEW COMPARISON:  Chest radiograph from one day prior. FINDINGS: Stable cardiomediastinal silhouette with mild cardiomegaly. No pneumothorax. Small bilateral pleural effusions, left greater than right, mildly increased. Diffuse patchy hazy opacities throughout both lungs, slightly worsened. IMPRESSION: 1. Mild cardiomegaly. 2. Small bilateral pleural effusions, left greater than right, mildly increased. 3. Diffuse patchy hazy opacities throughout both lungs, slightly worsened, differential includes cardiogenic pulmonary edema, multifocal pneumonia or ARDS. Electronically Signed   By: Ilona Sorrel M.D.   On: 03/21/2020 09:04   DG Chest Port 1 View  Result Date: 03/20/2020 CLINICAL DATA:  Post thoracentesis EXAM: PORTABLE CHEST 1 VIEW COMPARISON:  Radiograph 03/20/2020 FINDINGS: Interval decrease in the size of a right pleural effusion post thoracentesis. No large pneumothorax. Persistent left effusion and heterogeneous airspace opacities bilaterally, not significantly changed from comparison. Cardiomediastinal contours are partially obscured by opacity albeit with stable cardiomegaly and a calcified aorta. No acute osseous or soft tissue abnormality. Degenerative changes are present in the imaged spine and shoulders.  Telemetry leads overlie the chest. IMPRESSION: 1. Interval decrease in size of a right pleural effusion post thoracentesis. No large pneumothorax. 2. Otherwise unchanged left effusion and bilateral heterogeneous opacities possibly asymmetric edema and/or infection. Electronically Signed   By: Lovena Le M.D.   On: 03/20/2020 16:55   DG Chest Port 1 View  Result Date: 03/20/2020  CLINICAL DATA:  Respiratory distress. EXAM: PORTABLE CHEST 1 VIEW COMPARISON:  02/28/2020 and chest CT 03/18/2020 FINDINGS: A single view of the chest demonstrates patchy bilateral lung densities. Evidence for bilateral pleural effusions. Airspace densities in the right mid lung has slightly worsened but slightly decreased in the left mid lung. Heart size is slightly prominent but accentuated by technique. Heart size is grossly stable. Negative for pneumothorax. IMPRESSION: 1. Persistent bilateral lung densities that have slightly changed as described. Findings are suggestive for asymmetric pulmonary edema. Infection cannot be excluded. 2. Bilateral pleural effusions. Electronically Signed   By: Markus Daft M.D.   On: 03/20/2020 10:40   DG Chest Portable 1 View  Result Date: 03/11/2020 CLINICAL DATA:  Short of breath. EXAM: PORTABLE CHEST 1 VIEW COMPARISON:  03/02/20 FINDINGS: Stable cardiomediastinal contours. Aortic atherosclerosis. Increase in bilateral pleural effusions and interstitial edema. New bilateral airspace opacities are noted particularly involving the upper lobes. IMPRESSION: 1. Worsening CHF pattern. 2. New bilateral airspace opacities concerning for multifocal infection. Electronically Signed   By: Kerby Moors M.D.   On: 02/28/2020 08:24   ECHOCARDIOGRAM COMPLETE  Result Date: 03/17/2020    ECHOCARDIOGRAM REPORT   Patient Name:   WILLAM MUNFORD [facility] Date of Exam: 03/17/2020 Medical Rec #:  [id number]      Height:       70.0 in Accession #:    [id number]     Weight:       205.0 lb Date of Birth:  [birth year]       BSA:          2.109 m Patient Age:    33 years       BP:           133/74 mmHg Patient Gender: M              HR:           109 bpm. Exam Location:  [facility] Procedure: 2D Echo, Cardiac Doppler and Color Doppler Indications:     CHF- acute diastolic 706.23  History:         Patient has prior history of Echocardiogram examinations, most                  recent 11/11/2019. COPD and PAD; Risk Factors:Hypertension.                  Heart failure with improved ejection fraction Moderate                  mitral regurgitation.  Sonographer:     Sherrie Sport RDCS (AE) Referring Phys:  [phone] CHRISTOPHER END Diagnosing Phys: Kate Sable MD IMPRESSIONS  1. Left ventricular ejection fraction, by estimation, is 50 to 55%. The left ventricle has low normal function. The left ventricle has no regional wall motion abnormalities. There is moderate left ventricular hypertrophy. Left ventricular diastolic function could not be evaluated.  2. Right ventricular systolic function is low normal. The right ventricular size is mildly enlarged.  3. Left atrial size was mildly dilated.  4. Right atrial size was moderately dilated.  5. The mitral valve is degenerative. Trivial mitral valve regurgitation.  6. The aortic valve is tricuspid. Aortic valve regurgitation is mild. FINDINGS  Left Ventricle: Left ventricular ejection fraction, by estimation, is 50 to 55%. The left ventricle has low normal function. The left ventricle has no regional wall motion abnormalities. The left ventricular internal cavity size was normal in size. There is moderate left ventricular  hypertrophy. Left ventricular diastolic function could not be evaluated. Right Ventricle: The right ventricular size is mildly enlarged. No increase in right ventricular wall thickness. Right ventricular systolic function is low normal. Left Atrium: Left atrial size was mildly dilated. Right Atrium: Right atrial size was moderately dilated. Pericardium: There is no evidence of  pericardial effusion. Mitral Valve: The mitral valve is degenerative in appearance. Mild mitral annular calcification. Trivial mitral valve regurgitation. Tricuspid Valve: The tricuspid valve is normal in structure. Tricuspid valve regurgitation is not demonstrated. Aortic Valve: The aortic valve is tricuspid. Aortic valve regurgitation is mild. Aortic valve mean gradient measures 2.5 mmHg. Aortic valve peak gradient measures 4.7 mmHg. Aortic valve area, by VTI measures 4.48 cm. Pulmonic Valve: The pulmonic valve was normal in structure. Pulmonic valve regurgitation is not visualized. Aorta: The aortic root is normal in size and structure. IAS/Shunts: No atrial level shunt detected by color flow Doppler.  LEFT VENTRICLE PLAX 2D LVIDd:         4.11 cm LVIDs:         3.07 cm LV PW:         1.65 cm LV IVS:        1.77 cm LVOT diam:     2.10 cm LV SV:         68 LV SV Index:   32 LVOT Area:     3.46 cm  RIGHT VENTRICLE RV Basal diam:  4.51 cm LEFT ATRIUM           Index       RIGHT ATRIUM           Index LA diam:      4.00 cm 1.90 cm/m  RA Area:     30.40 cm LA Vol (A2C): 48.3 ml 22.90 ml/m RA Volume:   109.00 ml 51.67 ml/m LA Vol (A4C): 71.8 ml 34.04 ml/m  AORTIC VALVE                   PULMONIC VALVE AV Area (Vmax):    3.19 cm    PV Vmax:        0.71 m/s AV Area (Vmean):   3.26 cm    PV Peak grad:   2.0 mmHg AV Area (VTI):     4.48 cm    RVOT Peak grad: 4 mmHg AV Vmax:           108.50 cm/s AV Vmean:          74.250 cm/s AV VTI:            0.152 m AV Peak Grad:      4.7 mmHg AV Mean Grad:      2.5 mmHg LVOT Vmax:         99.80 cm/s LVOT Vmean:        69.800 cm/s LVOT VTI:          0.196 m LVOT/AV VTI ratio: 1.29  AORTA Ao Root diam: 3.70 cm MITRAL VALVE                TRICUSPID VALVE MV Area (PHT): 4.06 cm     TR Peak grad:   19.5 mmHg MV Decel Time: 187 msec     TR Vmax:        221.00 cm/s MV E velocity: 104.00 cm/s  SHUNTS                             Systemic VTI:  0.20 m                              Systemic Diam: 2.10 cm Kate Sable MD Electronically signed by Kate Sable MD Signature Date/Time: 03/17/2020/2:43:01 PM    Final    US THYROID  Result Date: 03/04/2020 CLINICAL DATA:  Palpable abnormality.  Enlarged thyroid gland. EXAM: THYROID ULTRASOUND TECHNIQUE: Ultrasound examination of the thyroid gland and adjacent soft tissues was performed. COMPARISON:  None. FINDINGS: Parenchymal Echotexture: Normal Isthmus: 0.3 cm Right lobe: 4.3 x 2.2 x 1.9 cm Left lobe: 4.4 x 1.9 x 1.7 cm _________________________________________________________ Estimated total number of nodules >/= 1 cm: 0 Number of spongiform nodules >/=  2 cm not described below (TR1): 0 Number of mixed cystic and solid nodules >/= 1.5 cm not described below (TR2): 0 _________________________________________________________ No discrete nodules are seen within the thyroid gland. No abnormal lymph nodes identified. IMPRESSION: Normal thyroid ultrasound. The thyroid gland is not enlarged and no focal nodules are identified. The above is in keeping with the ACR TI-RADS recommendations - J Am Coll Radiol 2017;14:587-595. Electronically Signed   By: Aletta Edouard M.D.   On: 03/04/2020 13:54   DG HIP OPERATIVE UNILAT W OR W/O PELVIS RIGHT  Result Date: 03/03/2020 CLINICAL DATA:  Total hip replacement EXAM: OPERATIVE RIGHT HIP   1 VIEW TECHNIQUE: Fluoroscopic spot image(s) were submitted for interpretation post-operatively. COMPARISON:  Pelvis radiograph March 02, 2020; CT and MR pelvis March 02, 2020 FLUOROSCOPY TIME:  0 minutes 6 seconds; 2 acquired images FINDINGS: Initial image demonstrates subcapital femoral neck fracture on the right. Subsequent image shows a total hip replacement right with visualized prosthetic components well-seated on frontal view. No fracture or dislocation evident post prosthetic placement. IMPRESSION: Total hip replacement on the right with visualized prosthetic components  well-seated on frontal view. No fracture or dislocation evident on image demonstrating prosthesis placement. Electronically Signed   By: Lowella Grip III M.D.   On: 03/03/2020 13:53   DG Hip Unilat With Pelvis 2-3 Views Left  Result Date: 03/02/2020 CLINICAL DATA:  Pain EXAM: DG HIP (WITH OR WITHOUT PELVIS) 2-3V LEFT COMPARISON:  None. FINDINGS: Frontal pelvis as well as frontal and lateral left hip images were obtained. No fracture or dislocation. There is mild symmetric narrowing of each hip joint. There is moderate osteoarthritic change in the right sacroiliac joint. There is also degenerative change in the lower lumbar spine. No erosions. There is bony overgrowth along the left lateral iliac crest. IMPRESSION: Mild symmetric narrowing each hip joint. 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 Bony overgrowth along the lateral left iliac crest measuring 8.8 x 3.3 cm. Question prior trauma in this area. Neoplastic etiology for this somewhat unusual appearance must be questioned. Nonemergent CT of the pelvis to further evaluate this region is felt to be warranted. No acute fracture or dislocation. Electronically Signed   By: Lowella Grip III M.D.   On: 03/02/2020 13:07   DG HIP UNILAT W OR W/O PELVIS 2-3 VIEWS RIGHT  Result Date: 03/03/2020 CLINICAL DATA:  Post RIGHT total hip arthroplasty EXAM: DG HIP (WITH OR WITHOUT PELVIS) 2-3V RIGHT COMPARISON:  03/02/2020 FINDINGS: RIGHT hip prosthesis newly identified. No acute fracture or dislocation. Bones demineralized. Overlying skin  clips and postsurgical changes of soft tissues. IMPRESSION: RIGHT hip prosthesis without acute complication. Electronically Signed   By: Lavonia Dana M.D.   On: 03/03/2020 14:13   US Abdomen Limited RUQ (LIVER/GB)  Result Date: 03/21/2020 CLINICAL DATA:  Hyperbilirubinemia. EXAM: ULTRASOUND ABDOMEN LIMITED RIGHT UPPER QUADRANT COMPARISON:  CT abdomen pelvis 03/08/2020 FINDINGS: Gallbladder: Dense  shadowing throughout the gallbladder compatible with calcification. There is peripheral calcification in the gallbladder fundus on the prior CT. This is presumably due to calcified gallstones although gallbladder wall calcification could have a similar appearance. Negative sonographic Murphy sign. Common bile duct: Diameter: 2.5 mm Liver: Increased echogenicity liver diffusely without focal liver lesion. Portal vein is patent on color Doppler imaging with normal direction of blood flow towards the liver. Other: None. IMPRESSION: Extensive shadowing due to gallbladder calcification. This is presumably a large gallstone although gallbladder wall calcification could also be present. This limits evaluation of the gallbladder lumen. No biliary dilatation Hyperechoic liver suggesting fatty infiltration. Electronically Signed   By: Franchot Gallo M.D.   On: 03/21/2020 09:26   US THORACENTESIS ASP PLEURAL SPACE W/IMG GUIDE  Result Date: 03/21/2020 INDICATION: Patient with history of CHF, COPD, respiratory failure ,atrial fibrillation, bilateral pleural effusions; status post right thoracentesis on 03/20/20; request now received for diagnostic and therapeutic left thoracentesis. EXAM: ULTRASOUND GUIDED DIAGNOSTIC THERAPEUTIC LEFT THORACENTESIS MEDICATIONS: 1% lidocaine to skin and subcutaneous tissue COMPLICATIONS: None immediate. PROCEDURE: An ultrasound guided thoracentesis was thoroughly discussed with the patient and questions answered. The benefits, risks, alternatives and complications were also discussed. The patient understands and wishes to proceed with the procedure. Written consent was obtained. Ultrasound was performed to localize and mark an adequate pocket of fluid in the left chest. The area was then prepped and draped in the normal sterile fashion. 1% Lidocaine was used for local anesthesia. Under ultrasound guidance a 6 Fr Safe-T-Centesis catheter was introduced. Thoracentesis was performed. The  catheter was removed and a dressing applied. FINDINGS: A total of approximately 550 cc of yellow fluid was removed. Samples were sent to the laboratory as requested by the clinical team. IMPRESSION: Successful ultrasound guided diagnostic and therapeutic left thoracentesis yielding 550 cc of pleural fluid. Read by: Rowe Robert, PA-C Electronically Signed   By: Markus Daft M.D.   On: 03/21/2020 11:52   US THORACENTESIS ASP PLEURAL SPACE W/IMG GUIDE  Result Date: 03/20/2020 INDICATION: Patient with history of acute hypoxic respiratory failure with bilateral pleural effusions. Request is for therapeutic and diagnostic thoracentesis EXAM: ULTRASOUND GUIDED THERAPEUTIC AND DIAGNOSTIC THORACENTESIS MEDICATIONS: Lidocaine 1% 10 mL COMPLICATIONS: None immediate. PROCEDURE: An ultrasound guided thoracentesis was thoroughly discussed with the patient and questions answered. The benefits, risks, alternatives and complications were also discussed. The patient understands and wishes to proceed with the procedure. Written consent was obtained. Ultrasound was performed to localize and mark an adequate pocket of fluid in the right chest. The area was then prepped and draped in the normal sterile fashion. 1% Lidocaine was used for local anesthesia. Under ultrasound guidance a 6 Fr Safe-T-Centesis catheter was introduced. Thoracentesis was performed. The catheter was removed and a dressing applied. FINDINGS: A total of approximately 450 mL of straw-colored fluid was removed. Samples were sent to the laboratory as requested by the clinical team. IMPRESSION: Successful ultrasound guided therapeutic and diagnostic right-sided thoracentesis yielding 450 mL of pleural fluid. Read by: Rushie Nyhan, NP Electronically Signed   By: Aletta Edouard M.D.   On: 03/20/2020 16:21    Consults: Treatment Team:  Kate Sable, MD Ottie Glazier, MD Pccm, Ander Gaster, MD Hessie Knows, MD   Subjective:    Overnight  Issues:   Objective:  Vital signs for last 24 hours: Temp:  [97.5 F (36.4 C)-97.6 F (36.4 C)] 97.5 F (36.4 C) (12/03 1945) Pulse Rate:  [93-124] 111 (12/03 2000) Resp:  [14-34] 32 (12/03 2000) BP: (102-143)/(62-87) 107/74 (12/03 2000) SpO2:  [87 %-99 %] 99 % (12/03 2000) FiO2 (%):  [90 %-100 %] 100 % (12/03 1945)  Hemodynamic parameters for last 24 hours:    Intake/Output from previous day: 12/02 0701 - 12/03 0700 In: 437 [P.O.:437] Out: 425 [Urine:425]  Intake/Output this shift: No intake/output data recorded.  Vent settings for last 24 hours: FiO2 (%):  [90 %-100 %] 100 %  Physical Exam:  GENERAL: Elderly male, acutely on chronically ill-appearing, uncomfortable, tachypneic HEAD: Normocephalic, atraumatic.  EYES: Pupils equal, round, reactive to light.  No scleral icterus.  MOUTH: Oral mucosa moist, no thrush. NECK: Supple. No thyromegaly. Trachea midline. No JVD.  No adenopathy. PULMONARY: Scattered rhonchi, crackles throughout.  No wheezes. CARDIOVASCULAR: S1 and S2.  Irregular rate with rate at 100-115 ABDOMEN: Nondistended, soft, nontender. MUSCULOSKELETAL: No joint deformity, no clubbing, no edema.  NEUROLOGIC: No overt focal deficit SKIN: Intact,warm,dry. 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 Continue high flow O2 Accept saturations of 88 to 92% titrate accordingly Increase steroids to 40 mg twice daily Obtain connective tissue disease/vasculitis markers Inflammatory markers This is an ILD flare, less likely infection or CHF Prognosis with ILD flare is poor overall Patient's family apprised of situation Patient is DNR/DNI  Acute decompensation of diastolic heart failure Atrial fibrillation with RVR Bilateral pleural effusions By chest x-ray effusions responded to diuresis and thoracentesis Transudative   effusions indicative of heart failure Diuretics as tolerates Would prefer alternative agent to amiodarone for A. fib control Continue supportive care  Anemia No evidence of bleeding H&H stable Transfuse if hemoglobin less than 7   LOS: 8 days   Additional comments: Discussed at multidisciplinary rounds.  Discussed with Dr. Leslye Peer.  Had long discussion with the patient's family (wife, 3 daughters and son-in-law) apprised that ILD flare carries a very poor prognosis.  They would like to continue the course over the weekend and reassess 12/6 if his condition does not improve they may consider transitioning to comfort care.   Critical Care Total Time*: Level 3 follow-up  C. Derrill Kay, MD [facility] 03/24/2020  *This note was dictated using voice recognition software/Dragon.  Despite best efforts to proofread, errors can occur which can change the meaning.  Any change was purely unintentional.

## 2020-03-24 NOTE — Progress Notes (Signed)
Patient ID: Raymond Barnett, male   DOB: 04/25/1929, 86 y.o.   MRN: [id number] [facility] PROGRESS NOTE  Raymond Barnett PJA:[id number] DOB: 1930-02-05 DOA: 02/22/2020 PCP: Raymond Hauser, DO  HPI/Subjective: Patient still having some shortness of breath.  Some cough.  Poor appetite.  Having some gas.  Admitted 03/11/2020 with shortness of breath  Objective: Vitals:   03/24/20 1339 03/24/20 1400  BP:  105/66  Pulse:  (!) 104  Resp:  (!) 32  Temp:    SpO2: 96% 96%    Intake/Output Summary (Last 24 hours) at 03/24/2020 1542 Last data filed at 03/24/2020 1501 Gross per 24 hour  Intake 437 ml  Output 575 ml  Net -138 ml   Filed Weights   03/01/2020 0735  Weight: 93 kg    ROS: Review of Systems  Respiratory: Positive for cough and shortness of breath.   Cardiovascular: Negative for chest pain.  Gastrointestinal: Negative for abdominal pain.   Exam: Physical Exam HENT:     Head: Normocephalic.     Mouth/Throat:     Pharynx: No oropharyngeal exudate.  Eyes:     General: Lids are normal.     Conjunctiva/sclera: Conjunctivae normal.     Pupils: Pupils are equal, round, and reactive to light.  Cardiovascular:     Rate and Rhythm: Normal rate. Rhythm irregularly irregular.     Heart sounds: Normal heart sounds, S1 normal and S2 normal.  Pulmonary:     Breath sounds: Examination of the right-middle field reveals decreased breath sounds and rhonchi. Examination of the left-middle field reveals decreased breath sounds and rhonchi. Examination of the right-lower field reveals decreased breath sounds and rhonchi. Examination of the left-lower field reveals decreased breath sounds and rhonchi. Decreased breath sounds and rhonchi present. No wheezing or rales.  Abdominal:     Palpations: Abdomen is soft.     Tenderness: There is no abdominal tenderness.  Musculoskeletal:     Right lower leg: Swelling present.     Left lower leg: Swelling present.  Skin:    General:  Skin is warm.     Findings: No rash.  Neurological:     Mental Status: He is alert and oriented to person, place, and time.       Data Reviewed: Basic Metabolic Panel: Recent Labs  Lab 03/19/20 0512 03/19/20 0512 03/20/20 0531 03/21/20 0533 03/22/20 0612 03/23/20 0654 03/24/20 0525  NA 134*  --   --  134* 134* 132* 134*  K 3.1*   < > 3.7 4.1 3.7 3.9 4.5  CL 93*  --   --  93* 93* 93* 91*  CO2 28  --   --  29 32 31 32  GLUCOSE 128*  --   --  121* 142* 119* 122*  BUN 45*  --   --  60* 59* 49* 44*  CREATININE 1.17  --   --  1.70* 1.50* 1.38* 1.14  CALCIUM 8.6*  --   --  8.6* 8.5* 8.7* 8.8*  MG  --   --  2.0 2.0 2.1 2.2 2.4  PHOS  --   --   --  2.7 2.5 2.1* 2.3*   < > = values in this interval not displayed.   Liver Function Tests: Recent Labs  Lab 03/20/20 0716 03/21/20 0533 03/23/20 0654 03/24/20 0525  AST 48*  --  46* 57*  ALT 34  --  30 39  ALKPHOS 93  --  107 121  BILITOT 2.0*  --  1.7* 1.5*  PROT 5.7*  --  5.7* 6.0*  ALBUMIN 2.7* 2.8* 2.5* 2.5*   CBC: Recent Labs  Lab 03/20/20 0531 03/21/20 0533 03/22/20 0612 03/23/20 0654 03/24/20 0525  WBC 12.9* 17.4* 15.0* 15.0* 15.2*  HGB 8.9*  8.9* 9.4* 9.2* 9.7* 9.5*  HCT 29.1* 30.0* 28.7* 30.1* 29.7*  MCV 101.4* 100.0 99.3 99.7 101.0*  PLT 209 188 183 165 180   BNP (last 3 results) Recent Labs    03/03/20 0020 03/04/20 0400 03/06/2020 0753  BNP 540.9* 409.8* 1,276.9*      Recent Results (from the past 240 hour(s))  Blood culture (routine x 2)     Status: None   Collection Time: 03/10/2020  7:53 AM   Specimen: BLOOD  Result Value Ref Range Status   Specimen Description BLOOD RIGHT HAND  Final   Special Requests   Final    BOTTLES DRAWN AEROBIC AND ANAEROBIC Blood Culture adequate volume   Culture   Final    NO GROWTH 5 DAYS Performed at [facility], [address] [place], [place] [address]    Report Status 03/21/2020 FINAL  Final  Resp Panel by RT-PCR (Flu A&B, Covid) Nasopharyngeal  Swab     Status: None   Collection Time: 03/12/2020  7:58 AM   Specimen: Nasopharyngeal Swab; Nasopharyngeal(NP) swabs in vial transport medium  Result Value Ref Range Status   SARS Coronavirus 2 by RT PCR NEGATIVE NEGATIVE Final    Comment: (NOTE) SARS-CoV-2 target nucleic acids are NOT DETECTED.  The SARS-CoV-2 RNA is generally detectable in upper respiratory specimens during the acute phase of infection. The lowest concentration of SARS-CoV-2 viral copies this assay can detect is 138 copies/mL. A negative result does not preclude SARS-Cov-2 infection and should not be used as the sole basis for treatment or other patient management decisions. A negative result may occur with  improper specimen collection/handling, submission of specimen other than nasopharyngeal swab, presence of viral mutation(s) within the areas targeted by this assay, and inadequate number of viral copies(<138 copies/mL). A negative result must be combined with clinical observations, patient history, and epidemiological information. The expected result is Negative.  Fact Sheet for Patients:  EntrepreneurPulse.com.au  Fact Sheet for Healthcare Providers:  IncredibleEmployment.be  This test is no t yet approved or cleared by the [place] FDA and  has been authorized for detection and/or diagnosis of SARS-CoV-2 by FDA under an Emergency Use Authorization (EUA). This EUA will remain  in effect (meaning this test can be used) for the duration of the COVID-19 declaration under Section 564(b)(1) of the Act, 21 U.S.C.section 360bbb-3(b)(1), unless the authorization is terminated  or revoked sooner.       Influenza A by PCR NEGATIVE NEGATIVE Final   Influenza B by PCR NEGATIVE NEGATIVE Final    Comment: (NOTE) The Xpert Xpress SARS-CoV-2/FLU/RSV plus assay is intended as an aid in the diagnosis of influenza from Nasopharyngeal swab specimens and should not be used as a sole  basis for treatment. Nasal washings and aspirates are unacceptable for Xpert Xpress SARS-CoV-2/FLU/RSV testing.  Fact Sheet for Patients: EntrepreneurPulse.com.au  Fact Sheet for Healthcare Providers: IncredibleEmployment.be  This test is not yet approved or cleared by the [place] FDA and has been authorized for detection and/or diagnosis of SARS-CoV-2 by FDA under an Emergency Use Authorization (EUA). This EUA will remain in effect (meaning this test can be used) for the duration of the COVID-19 declaration under Section 564(b)(1) of the Act, 21 U.S.C. section  360bbb-3(b)(1), unless the authorization is terminated or revoked.  Performed at [facility], [address] [place], [place] [address]   Blood culture (routine x 2)     Status: None   Collection Time: 03/09/2020  7:58 AM   Specimen: BLOOD  Result Value Ref Range Status   Specimen Description BLOOD LEFT AC  Final   Special Requests   Final    BOTTLES DRAWN AEROBIC AND ANAEROBIC Blood Culture results may not be optimal due to an inadequate volume of blood received in culture bottles   Culture   Final    NO GROWTH 5 DAYS Performed at [facility], [address] [place], [place] [address]    Report Status 03/21/2020 FINAL  Final  MRSA PCR Screening     Status: None   Collection Time: 03/17/20  9:51 AM   Specimen: Nasal Mucosa; Nasopharyngeal  Result Value Ref Range Status   MRSA by PCR NEGATIVE NEGATIVE Final    Comment:        The GeneXpert MRSA Assay (FDA approved for NASAL specimens only), is one component of a comprehensive MRSA colonization surveillance program. It is not intended to diagnose MRSA infection nor to guide or monitor treatment for MRSA infections. Performed at [facility], [address] [place], [place] [address]   Expectorated sputum assessment w rflx to resp cult     Status: None   Collection Time: 03/20/20  2:56 PM    Specimen: Expectorated Sputum  Result Value Ref Range Status   Specimen Description EXPECTORATED SPUTUM  Final   Special Requests NONE  Final   Sputum evaluation   Final    THIS SPECIMEN IS ACCEPTABLE FOR SPUTUM CULTURE Performed at [facility], [address], [place], [place] [address]    Report Status 03/20/2020 FINAL  Final  Culture, respiratory     Status: None   Collection Time: 03/20/20  2:56 PM  Result Value Ref Range Status   Specimen Description   Final    EXPECTORATED SPUTUM Performed at [facility], [address] [place], [place] [address]    Special Requests   Final    NONE Reflexed from [id number] Performed at [facility], [address] [place] [address], [place] [address]    Gram Stain   Final    NO WBC SEEN FEW SQUAMOUS EPITHELIAL CELLS PRESENT ABUNDANT GRAM POSITIVE COCCI FEW GRAM NEGATIVE RODS FEW GRAM POSITIVE RODS    Culture   Final    Normal respiratory flora-no Staph aureus or Pseudomonas seen Performed at [facility], [place] [address], [place], [place] [address]    Report Status 03/23/2020 FINAL  Final  Body fluid culture     Status: None   Collection Time: 03/20/20  4:00 PM   Specimen: PATH Cytology Pleural fluid  Result Value Ref Range Status   Specimen Description   Final    PLEURAL Performed at [facility], [address], [place], [place] [address]    Special Requests   Final    NONE Performed at [facility], [address] [place], [place] [address]    Gram Stain   Final    RARE WBC PRESENT,BOTH PMN AND MONONUCLEAR NO ORGANISMS SEEN    Culture   Final    NO GROWTH 3 DAYS Performed at [facility], [place] [address], [place], [place] [address]    Report Status 03/24/2020 FINAL  Final  Fungus Culture With Stain  Status: None (Preliminary result)   Collection Time: 03/20/20  4:00 PM   Specimen: PATH Cytology Pleural fluid  Result Value Ref Range Status   Fungus Stain Final  report  Final    Comment: (NOTE) Performed At: [facility] [place], [place] [id number] Rush Farmer MD [id number]    Fungus (Mycology) Culture PENDING  Incomplete   Fungal Source PLEURAL  Final    Comment: Performed at [facility], [address] [place], [place] [address]  Acid Fast Smear (AFB)     Status: None   Collection Time: 03/20/20  4:00 PM   Specimen: PATH Cytology Pleural fluid  Result Value Ref Range Status   AFB Specimen Processing Concentration  Final   Acid Fast Smear Negative  Final    Comment: (NOTE) Performed At: [facility] [place], [place] [id number] Rush Farmer MD WI:[id number]    Source (AFB) PLEURAL  Final    Comment: Performed at [facility], [address] [place], [place] [address]  Fungus Culture Result     Status: None   Collection Time: 03/20/20  4:00 PM  Result Value Ref Range Status   Result 1 Comment  Final    Comment: (NOTE) KOH/Calcofluor preparation:  no fungus observed. Performed At: [facility] [place], [place] [id number] Rush Farmer MD [id number]   Body fluid culture     Status: None   Collection Time: 03/21/20 11:49 AM   Specimen: PATH Cytology Pleural fluid  Result Value Ref Range Status   Specimen Description   Final    PLEURAL Performed at [facility], [address] [place], [place] [address]    Special Requests   Final    NONE Performed at [facility], [address] [place], [place] [address]    Gram Stain   Final    RARE WBC PRESENT, PREDOMINANTLY MONONUCLEAR NO ORGANISMS SEEN    Culture   Final    NO GROWTH 3 DAYS Performed at [facility] [address], [place], [place] [address]    Report Status 03/24/2020 FINAL  Final  Fungus Culture With Stain     Status: None (Preliminary result)   Collection Time: 03/21/20 11:49 AM   Specimen: PATH Cytology Pleural fluid  Result Value  Ref Range Status   Fungus Stain Final report  Final    Comment: (NOTE) Performed At: [facility] [place], [place] [id number] Rush Farmer MD TA:[id number]    Fungus (Mycology) Culture PENDING  Incomplete   Fungal Source PLEURAL  Final    Comment: Performed at [facility], [address] [place], [place] [address]  Fungus Culture Result     Status: None   Collection Time: 03/21/20 11:49 AM  Result Value Ref Range Status   Result 1 Comment  Final    Comment: (NOTE) KOH/Calcofluor preparation:  no fungus observed. Performed At: [facility] [place], [place] [id number] Rush Farmer MD QG:[id number]   Resp Panel by RT-PCR (Flu A&B, Covid) Nasopharyngeal Swab     Status: None   Collection Time: 03/23/20 10:34 AM   Specimen: Nasopharyngeal Swab; Nasopharyngeal(NP) swabs in vial transport medium  Result Value Ref Range Status   SARS Coronavirus 2 by RT PCR NEGATIVE NEGATIVE Final    Comment: (NOTE) SARS-CoV-2 target nucleic acids are NOT DETECTED.  The SARS-CoV-2 RNA is generally detectable in upper respiratory specimens during the acute phase of infection. The  lowest concentration of SARS-CoV-2 viral copies this assay can detect is 138 copies/mL. A negative result does not preclude SARS-Cov-2 infection and should not be used as the sole basis for treatment or other patient management decisions. A negative result may occur with  improper specimen collection/handling, submission of specimen other than nasopharyngeal swab, presence of viral mutation(s) within the areas targeted by this assay, and inadequate number of viral copies(<138 copies/mL). A negative result must be combined with clinical observations, patient history, and epidemiological information. The expected result is Negative.  Fact Sheet for Patients:  EntrepreneurPulse.com.au  Fact Sheet for Healthcare Providers:   IncredibleEmployment.be  This test is no t yet approved or cleared by the [place] FDA and  has been authorized for detection and/or diagnosis of SARS-CoV-2 by FDA under an Emergency Use Authorization (EUA). This EUA will remain  in effect (meaning this test can be used) for the duration of the COVID-19 declaration under Section 564(b)(1) of the Act, 21 U.S.C.section 360bbb-3(b)(1), unless the authorization is terminated  or revoked sooner.       Influenza A by PCR NEGATIVE NEGATIVE Final   Influenza B by PCR NEGATIVE NEGATIVE Final    Comment: (NOTE) The Xpert Xpress SARS-CoV-2/FLU/RSV plus assay is intended as an aid in the diagnosis of influenza from Nasopharyngeal swab specimens and should not be used as a sole basis for treatment. Nasal washings and aspirates are unacceptable for Xpert Xpress SARS-CoV-2/FLU/RSV testing.  Fact Sheet for Patients: EntrepreneurPulse.com.au  Fact Sheet for Healthcare Providers: IncredibleEmployment.be  This test is not yet approved or cleared by the [place] FDA and has been authorized for detection and/or diagnosis of SARS-CoV-2 by FDA under an Emergency Use Authorization (EUA). This EUA will remain in effect (meaning this test can be used) for the duration of the COVID-19 declaration under Section 564(b)(1) of the Act, 21 U.S.C. section 360bbb-3(b)(1), unless the authorization is terminated or revoked.  Performed at [facility], [address], [place], [place] [address]      Studies: [facility] Chest [place] 1 View  Result Date: 03/23/2020 CLINICAL DATA:  Follow-up for abnormal chest radiograph. Respiratory distress. History of COPD and hypertension. EXAM: PORTABLE CHEST 1 VIEW COMPARISON:  03/21/2020 and older exams. FINDINGS: Cardiac silhouette is mildly enlarged but stable. Bilateral interstitial and airspace lung opacities are unchanged. Bilateral pleural effusions,  more apparent on the left, also stable. No pneumothorax. IMPRESSION: 1. No change in lung aeration since the most recent prior exam. Findings may reflect pulmonary edema due to congestive heart failure, multifocal infection or a combination. Electronically Signed   By: Lajean Manes M.D.   On: 03/23/2020 10:14    Scheduled Meds: . amiodarone  400 mg Oral BID  . apixaban  5 mg Oral BID  . calcium-vitamin D  1 tablet Oral Daily  . Chlorhexidine Gluconate Cloth  6 each Topical Daily  . cholecalciferol  1,000 Units Oral Daily  . docusate sodium  100 mg Oral BID  . feeding supplement  237 mL Oral TID BM  . finasteride  5 mg Oral Daily  . melatonin  5 mg Oral QHS  . methylPREDNISolone (SOLU-MEDROL) injection  40 mg Intravenous Q24H  . multivitamin with minerals  1 tablet Oral Daily  . pantoprazole  40 mg Oral Daily  . polyethylene glycol  17 g Oral QHS  . QUEtiapine  25 mg Oral QHS  . simethicone  80 mg Oral QID  . timolol  1 drop Both Eyes BID  Assessment/Plan:  1. Acute hypoxic respiratory failure secondary to pulmonary fibrosis, CHF, pneumonia and pleural effusions.  Patient had room air saturation 82% on presentation (less than 90%).  Patient's respiratory status over the last few days had worsened requiring heated high flow nasal cannula 55 L flow and 100% oxygen and today also 100% nonrebreather.  Steroids started yesterday.  Cardiology to give a dose of Lasix.  Case discussed with pulmonary.  2 Covid test negative.  Appreciate palliative care consultation.  Patient is a DNR.  Spoke with daughter at the bedside.  Continue incentive spirometer.  To hypoxic for invasive procedure at this point.  Asked pulmonary to determine whether KL6 testing would be indicated or not. 2. Severe sepsis, present on admission with multifocal pneumonia.  Completed antibiotics Rocephin and Zithromax.  Blood cultures and pleural fluid cultures negative for infection.  Sputum culture normal flora.  Covid testing  x2 -. 3. Acute on chronic diastolic congestive heart failure with bilateral pleural effusions.  Patient diuresed initially and then creatinine worsened and diuresis was held.  Intermittent diuresis now.  Patient also status post bilateral thoracentesis. 4. Persistent atrial fibrillation on Eliquis for anticoagulation and amiodarone for rate control 5. Acute renal failure.  Creatinine increased by 0.53 (greater than 0.3 mg/dL within 48 hours) from 11/28-11/30.  Creatinine improved to 1.14 today 6. Weakness.  Continued physical therapy evaluation 7. Acquired thrombophilia secondary to atrial fibrillation.  On Eliquis to reduce stroke risk.    Code Status:     Code Status Orders  (From admission, onward)         Start     Ordered   03/15/2020 1026  Do not attempt resuscitation (DNR)  Continuous       Question Answer Comment  In the event of cardiac or respiratory ARREST Do not call a "code blue"   In the event of cardiac or respiratory ARREST Do not perform Intubation, CPR, defibrillation or ACLS   In the event of cardiac or respiratory ARREST Use medication by any route, position, wound care, and other measures to relive pain and suffering. May use oxygen, suction and manual treatment of airway obstruction as needed for comfort.      03/10/2020 1029        Code Status History    Date Active Date Inactive Code Status Order ID Comments User Context   03/03/2020 0037 03/07/2020 2337 DNR [id number]  Athena Masse, MD ED   03/02/2020 2336 03/03/2020 0037 Full Code [id number]  Athena Masse, MD ED   Advance Care Planning Activity    Advance Directive Documentation     Most Recent Value  Type of Advance Directive Out of facility DNR (pink MOST or yellow form)  Pre-existing out of facility DNR order (yellow form or pink MOST form) Yellow form placed in chart (order not valid for inpatient use)  "MOST" Form in Place? --     Family Communication: Patient's daughter at the  bedside Disposition Plan: Status is: Inpatient  Dispo: The patient is from: Home              Anticipated d/c is to: Yet to be determined              Anticipated d/c date is: On way too much oxygen to anticipate at this point              Patient currently not medically stable  Consultants:  Critical care specialist  Cardiologist  Infectious disease  Palliative  care  Time spent: 29 minutes  [place]

## 2020-03-24 NOTE — Consult Note (Addendum)
Consultation Note Date: 03/24/2020   Patient Name: Raymond Barnett  DOB: 1929/10/19  MRN: [id number]  Age / Sex: 86 y.o., male  PCP: [name], DO Referring Physician: Loletha Grayer, MD  Reason for Consultation: Establishing goals of care  HPI/Patient Profile: Raymond Barnett is a 86 y.o. male with medical history significant for chronic diastolic dysfunction CHF, COPD, status post recent right total hip arthroplasty and peripheral arterial disease who presents from the skilled nursing facility for evaluation of shortness of breath.  Shortness of breath is worse with any form of exertion and is associated with a cough productive of clear phlegm.  Clinical Assessment and Goals of Care: Patient is sitting in bed with with her full cannula in place, and daughter at bedside.  He states that he is having shortness of breath.  He states that with his first thoracentesis he felt better, but not so much with the second one.  We discussed his level of oxygen requirement.  Per daughter, patient lives at home with his wife at baseline. His daughter discusses that over the past 3 months he has been having increasing shortness of breath, but remained independent, and was able to drive and go places with his wife.  In mid- November he fell and broke his right hip, requiring operative management, and then went to a skilled nursing facility for rehab. Patient and daughter discusses that he has continued to have pain in his left hip though his right hip was surgically managed.  She states that he uses a scooter and electric wheelchair instead of walking because of this pain. He denies falling.  Patient and daughter state they are waiting to see what infectious disease and orthopedics advises.   PALLIATIVE ASSESSMENT Patient's most prominant complaint of which he returned to several times during the conversation is  left hip pain that necessitates the use of a scooter or electric wheelchair.  A CT scan was completed on 11/25 that showed no acute findings within the abdomen or pelvis.  It did note a mild acute appearing superior endplate deformity at L2 with approximately 15% height loss, as well as multi level degenerative disc disease noted throughout the lumbar spine. The findings of this study were noted to the primary attending through epic chat as well as discussed with CCM. Also discussed with CCM to aid in determining a complete clinical picture and prognostication, as well as the pleural effusions for which he has had bilateral thoracentesis, and findings concerning for congestive heart failure and possible pneumonia.  Raymond Barnett  has been diuresed but continues to have shortness of breath and requires high flow [place]. ID has been consulted as well as orthopedics.  SUMMARY OF RECOMMENDATIONS   Patient and daughter waiting to see what recommendations are made by infectious disease and orthopedics prior to making further decisions.   Prognosis:   Poor overall      Primary Diagnoses: Present on Admission: . Severe sepsis ([place]) . Acute diastolic CHF (congestive heart failure) ([place]) . Essential hypertension .  COPD (chronic obstructive pulmonary disease) ([place])   I have reviewed the medical record, interviewed the patient and family, and examined the patient. The following aspects are pertinent.  Past Medical History:  Diagnosis Date  . (HFpEF) heart failure with improved ejection fraction ([place])    a. 08/2017 Echo: EF 45-50%; b. 10/2019 Echo: EF 55-60%. No rwma, mild LVH, Gr2 DD, RVSP 47.39mmHg. Mod dil LA. Mod MR. Asc Ao 104mm.  . Arthritis   . Benign prostate hyperplasia    lower urinary tract symptoms  . Cataracts, bilateral   . COPD (chronic obstructive pulmonary disease) ([place])   . DDD (degenerative disc disease), lumbar   . Glaucoma   . H/O carpal tunnel syndrome   . History of osteomyelitis  1953   right leg  . Hypertension   . Moderate mitral regurgitation    a. 10/2019 Echo: Mod MR.  Marland Kitchen PAD (peripheral artery disease) ([place])    a. 08/2017 LE ABI & Duplex: ABI R 0.60, L 1.07. 30-49% R popliteal and 50-74% R PT stenoses.  . Thrombocytopenia ([place])    Social History   Socioeconomic History  . Marital status: Married    Spouse name: Not on file  . Number of children: Not on file  . Years of education: Not on file  . Highest education level: High school graduate  Occupational History  . Not on file  Tobacco Use  . Smoking status: Former Smoker    Packs/day: 1.00    Years: 30.00    Pack years: 30.00    Types: Cigarettes    Quit date: 1995    Years since quitting: 26.9  . Smokeless tobacco: Never Used  Vaping Use  . Vaping Use: Never used  Substance and Sexual Activity  . Alcohol use: Not Currently    Comment: occasionally wine   . Drug use: No  . Sexual activity: Not Currently  Other Topics Concern  . Not on file  Social History Narrative  . Not on file   Social Determinants of Health   Financial Resource Strain:   . Difficulty of Paying Living Expenses: Not on file  Food Insecurity:   . Worried About Charity fundraiser in the Last Year: Not on file  . Ran Out of Food in the Last Year: Not on file  Transportation Needs:   . Lack of Transportation (Medical): Not on file  . Lack of Transportation (Non-Medical): Not on file  Physical Activity:   . Days of Exercise per Week: Not on file  . Minutes of Exercise per Session: Not on file  Stress:   . Feeling of Stress : Not on file  Social Connections:   . Frequency of Communication with Friends and Family: Not on file  . Frequency of Social Gatherings with Friends and Family: Not on file  . Attends Religious Services: Not on file  . Active Member of Clubs or Organizations: Not on file  . Attends Archivist Meetings: Not on file  . Marital Status: Not on file   Family History  Problem Relation  Age of Onset  . Colon cancer Mother 86  . Thrombosis Father 60  . Heart disease Father        Heart valve problem  . AAA (abdominal aortic aneurysm) Father   . Hypertension Sister   . Hyperlipidemia Sister   . Hypertension Sister   . Hyperlipidemia Sister   . Hypertension Sister   . Hyperlipidemia Sister    Scheduled Meds: .  amiodarone  400 mg Oral BID  . apixaban  5 mg Oral BID  . azithromycin  500 mg Oral Daily  . calcium-vitamin D  1 tablet Oral Daily  . Chlorhexidine Gluconate Cloth  6 each Topical Daily  . cholecalciferol  1,000 Units Oral Daily  . docusate sodium  100 mg Oral BID  . feeding supplement  237 mL Oral TID BM  . finasteride  5 mg Oral Daily  . furosemide  20 mg Intravenous Once  . methylPREDNISolone (SOLU-MEDROL) injection  40 mg Intravenous Q24H  . multivitamin with minerals  1 tablet Oral Daily  . pantoprazole  40 mg Oral Daily  . polyethylene glycol  17 g Oral QHS  . potassium & sodium phosphates  1 packet Oral TID WC & HS  . QUEtiapine  25 mg Oral QHS  . timolol  1 drop Both Eyes BID   Continuous Infusions: . cefTRIAXone (ROCEPHIN)  IV 2 g (03/23/20 1339)   PRN Meds:.acetaminophen **OR** acetaminophen, alum & mag hydroxide-simeth, bisacodyl, fluticasone, methocarbamol, ondansetron **OR** ondansetron (ZOFRAN) IV, polyvinyl alcohol, traZODone Medications Prior to Admission:  Prior to Admission medications   Medication Sig Start Date End Date Taking? Authorizing Provider  ASPIRIN LOW DOSE 81 MG chewable tablet Chew 81 mg by mouth daily. 07/17/19  Yes [provider]  Calcium Carb-Cholecalciferol (OYSTER SHELL CALCIUM/D) 500-5 MG-MCG TABS Take 1 tablet by mouth daily. 03/07/20  Yes Nolberto Hanlon, MD  cholecalciferol (VITAMIN D) 25 MCG (1000 UNIT) tablet Take 1,000 Units by mouth daily. 12/15/19  Yes [provider]  docusate sodium (COLACE) 100 MG capsule Take 1 capsule (100 mg total) by mouth 2 (two) times daily. 03/07/20  Yes Nolberto Hanlon,  MD  enoxaparin (LOVENOX) 40 MG/0.4ML injection Inject 0.4 mLs (40 mg total) into the skin daily for 14 days. 03/07/20 03/21/20 Yes Duanne Guess, PA-C  finasteride (PROSCAR) 5 MG tablet Take 5 mg by mouth daily.   Yes [provider]  furosemide (LASIX) 20 MG tablet Take 1 tablet (20 mg total) by mouth daily. 12/30/19 03/29/20 Yes End, [name], MD  HYDROcodone-acetaminophen (NORCO/VICODIN) 5-325 MG tablet Take 1-2 tablets by mouth every 4 (four) hours as needed for moderate pain (pain score 4-6). 03/06/20  Yes Duanne Guess, PA-C  methocarbamol (ROBAXIN) 500 MG tablet Take 1 tablet (500 mg total) by mouth every 6 (six) hours as needed for muscle spasms. 03/07/20  Yes Nolberto Hanlon, MD  pantoprazole (PROTONIX) 40 MG tablet Take 1 tablet (40 mg total) by mouth daily. 03/07/20  Yes Nolberto Hanlon, MD  polyethylene glycol (MIRALAX / GLYCOLAX) 17 g packet Take 17 g by mouth daily. 03/07/20  Yes Nolberto Hanlon, MD  timolol (TIMOPTIC) 0.5 % ophthalmic solution Place 1 drop into both eyes 2 (two) times daily.    Yes [provider]  traZODone (DESYREL) 100 MG tablet Take 1 tablet (100 mg total) by mouth at bedtime. 12/22/19  Yes [name], [name], DO  acetaminophen (TYLENOL) 325 MG tablet Take 1-2 tablets (325-650 mg total) by mouth every 6 (six) hours as needed for mild pain (pain score 1-3 or temp > 100.5). 03/07/20   Nolberto Hanlon, MD  alum & mag hydroxide-simeth (MAALOX/MYLANTA) 200-200-20 MG/5ML suspension Take 30 mLs by mouth every 4 (four) hours as needed for indigestion. 03/07/20   Nolberto Hanlon, MD  bisacodyl (DULCOLAX) 10 MG suppository Place 1 suppository (10 mg total) rectally daily as needed for moderate constipation. 03/07/20   Nolberto Hanlon, MD  fluticasone (FLONASE) 50  MCG/ACT nasal spray Place 2 sprays into both nostrils daily. Use for 4-6 weeks then stop and use seasonally or as needed. Patient taking differently: Place 2 sprays into both nostrils as directed. Use for  4-6 weeks then stop and use seasonally or as needed. 11/09/19   [name], [name], DO  LUBRICATING PLUS EYE DROPS 0.5 % SOLN Apply 1 application to eye as needed (dry eyes).  08/25/19   [provider]  magnesium citrate SOLN Take 296 mLs (1 Bottle total) by mouth once as needed for severe constipation. 03/07/20   Nolberto Hanlon, MD  OXYGEN Inhale 2 L into the lungs at bedtime.    [provider]   No Known Allergies Review of Systems  Constitutional: Positive for activity change and fatigue.  Respiratory: Positive for shortness of breath.   Musculoskeletal:       Left hip pain.    Physical Exam  Vital Signs: BP 108/64 (BP Location: Right Arm)   Pulse (!) 114   Temp (!) 97.5 F (36.4 C) (Oral)   Resp (!) 30   Ht 5\' 10"  (1.778 m)   Wt 93 kg   SpO2 (!) 88%   BMI 29.41 kg/m  Pain Scale: 0-10   Pain Score: 0-No pain   SpO2: SpO2: (!) 88 % O2 Device:SpO2: (!) 88 % O2 Flow Rate: .O2 Flow Rate (L/min): 60 L/min  IO: Intake/output summary:   Intake/Output Summary (Last 24 hours) at 03/24/2020 1115 Last data filed at 03/24/2020 0600 Gross per 24 hour  Intake 437 ml  Output 425 ml  Net 12 ml    LBM: Last BM Date: 03/23/20 Baseline Weight: Weight: 93 kg Most recent weight: Weight: 93 kg      Time In: 3:50 Time Out: 4:20 Time Total: 30 min Greater than 50%  of this time was spent counseling and coordinating care related to the above assessment and plan.  Signed by: Asencion Gowda, NP   Please contact Palliative Medicine Team phone at [phone] for questions and concerns.  For individual provider: See Shea Evans

## 2020-03-25 DIAGNOSIS — J8410 Pulmonary fibrosis, unspecified: Secondary | ICD-10-CM | POA: Diagnosis not present

## 2020-03-25 DIAGNOSIS — I5031 Acute diastolic (congestive) heart failure: Secondary | ICD-10-CM | POA: Diagnosis not present

## 2020-03-25 DIAGNOSIS — J849 Interstitial pulmonary disease, unspecified: Secondary | ICD-10-CM | POA: Diagnosis not present

## 2020-03-25 DIAGNOSIS — J9601 Acute respiratory failure with hypoxia: Secondary | ICD-10-CM | POA: Diagnosis not present

## 2020-03-25 DIAGNOSIS — A419 Sepsis, unspecified organism: Secondary | ICD-10-CM | POA: Diagnosis not present

## 2020-03-25 DIAGNOSIS — J90 Pleural effusion, not elsewhere classified: Secondary | ICD-10-CM | POA: Diagnosis not present

## 2020-03-25 DIAGNOSIS — I4819 Other persistent atrial fibrillation: Secondary | ICD-10-CM | POA: Diagnosis not present

## 2020-03-25 LAB — CBC
HCT: 31.7 % — ABNORMAL LOW (ref 39.0–52.0)
Hemoglobin: 9.9 g/dL — ABNORMAL LOW (ref 13.0–17.0)
MCH: 31.4 pg (ref 26.0–34.0)
MCHC: 31.2 g/dL (ref 30.0–36.0)
MCV: 100.6 fL — ABNORMAL HIGH (ref 80.0–100.0)
Platelets: 233 10*3/uL (ref 150–400)
RBC: 3.15 MIL/uL — ABNORMAL LOW (ref 4.22–5.81)
RDW: 25.4 % — ABNORMAL HIGH (ref 11.5–15.5)
WBC: 17.7 10*3/uL — ABNORMAL HIGH (ref 4.0–10.5)
nRBC: 0.6 % — ABNORMAL HIGH (ref 0.0–0.2)

## 2020-03-25 LAB — ENA+DNA/DS+ANTICH+CENTRO+JO...
Anti JO-1: <0.2 AI (ref 0.0–0.9)
Centromere Ab Screen: <0.2 AI (ref 0.0–0.9)
Chromatin Ab SerPl-aCnc: 0.4 AI (ref 0.0–0.9)
ENA SM Ab Ser-aCnc: 0.4 AI (ref 0.0–0.9)
Ribonucleic Protein: 1 AI — ABNORMAL HIGH (ref 0.0–0.9)
SSA (Ro) (ENA) Antibody, IgG: <0.2 AI (ref 0.0–0.9)
SSB (La) (ENA) Antibody, IgG: <0.2 AI (ref 0.0–0.9)
Scleroderma (Scl-70) (ENA) Antibody, IgG: <0.2 AI (ref 0.0–0.9)
ds DNA Ab: 42 IU/mL — ABNORMAL HIGH (ref 0–9)

## 2020-03-25 LAB — ANA W/REFLEX IF POSITIVE: Anti Nuclear Antibody (ANA): POSITIVE — AB

## 2020-03-25 LAB — COMPREHENSIVE METABOLIC PANEL
ALT: 46 U/L — ABNORMAL HIGH (ref 0–44)
AST: 63 U/L — ABNORMAL HIGH (ref 15–41)
Albumin: 2.4 g/dL — ABNORMAL LOW (ref 3.5–5.0)
Alkaline Phosphatase: 123 U/L (ref 38–126)
Anion gap: 14 (ref 5–15)
BUN: 51 mg/dL — ABNORMAL HIGH (ref 8–23)
CO2: 30 mmol/L (ref 22–32)
Calcium: 9 mg/dL (ref 8.9–10.3)
Chloride: 91 mmol/L — ABNORMAL LOW (ref 98–111)
Creatinine, Ser: 1.25 mg/dL — ABNORMAL HIGH (ref 0.61–1.24)
GFR, Estimated: 55 mL/min — ABNORMAL LOW (ref >60)
Glucose, Bld: 115 mg/dL — ABNORMAL HIGH (ref 70–99)
Potassium: 5.3 mmol/L — ABNORMAL HIGH (ref 3.5–5.1)
Sodium: 135 mmol/L (ref 135–145)
Total Bilirubin: 1.6 mg/dL — ABNORMAL HIGH (ref 0.3–1.2)
Total Protein: 6.1 g/dL — ABNORMAL LOW (ref 6.5–8.1)

## 2020-03-25 LAB — CHOLESTEROL, BODY FLUID: Cholesterol, Fluid: 16 mg/dL

## 2020-03-25 LAB — MAGNESIUM: Magnesium: 2.4 mg/dL (ref 1.7–2.4)

## 2020-03-25 LAB — BRAIN NATRIURETIC PEPTIDE: B Natriuretic Peptide: 995.1 pg/mL — ABNORMAL HIGH (ref 0.0–100.0)

## 2020-03-25 LAB — MPO/PR-3 (ANCA) ANTIBODIES
ANCA Proteinase 3: <3.5 U/mL (ref 0.0–3.5)
Myeloperoxidase Abs: <9 U/mL (ref 0.0–9.0)

## 2020-03-25 LAB — PHOSPHORUS: Phosphorus: 3.8 mg/dL (ref 2.5–4.6)

## 2020-03-25 LAB — RHEUMATOID FACTOR: Rheumatoid fact SerPl-aCnc: 32.6 IU/mL — ABNORMAL HIGH (ref <14.0)

## 2020-03-25 MED ORDER — DIGOXIN 0.25 MG/ML IJ SOLN
0.2500 mg | Freq: Once | INTRAMUSCULAR | Status: AC
  Administered 2020-03-25: 0.25 mg via INTRAVENOUS
  Filled 2020-03-25: qty 2

## 2020-03-25 MED ORDER — FUROSEMIDE 10 MG/ML IJ SOLN
40.0000 mg | Freq: Once | INTRAMUSCULAR | Status: AC
  Administered 2020-03-25: 40 mg via INTRAVENOUS
  Filled 2020-03-25: qty 4

## 2020-03-25 MED ORDER — ZOLPIDEM TARTRATE 5 MG PO TABS
5.0000 mg | ORAL_TABLET | Freq: Every evening | ORAL | Status: DC | PRN
  Administered 2020-03-25: 5 mg via ORAL
  Filled 2020-03-25: qty 1

## 2020-03-25 MED ORDER — METHYLPREDNISOLONE SODIUM SUCC 40 MG IJ SOLR
40.0000 mg | Freq: Two times a day (BID) | INTRAMUSCULAR | Status: DC
  Filled 2020-03-25: qty 1

## 2020-03-25 MED ORDER — DIGOXIN 0.25 MG/ML IJ SOLN
0.5000 mg | Freq: Once | INTRAMUSCULAR | Status: AC
  Administered 2020-03-25: 0.5 mg via INTRAVENOUS
  Filled 2020-03-25: qty 2

## 2020-03-25 MED ORDER — DIGOXIN 0.25 MG/ML IJ SOLN
0.2500 mg | Freq: Once | INTRAMUSCULAR | Status: AC
  Administered 2020-03-26: 0.25 mg via INTRAVENOUS
  Filled 2020-03-25: qty 2

## 2020-03-25 MED ORDER — DEXTROSE-NACL 5-0.45 % IV SOLN: INTRAVENOUS | Status: DC

## 2020-03-25 MED ORDER — SODIUM ZIRCONIUM CYCLOSILICATE 5 G PO PACK
10.0000 g | PACK | Freq: Once | ORAL | Status: AC
  Administered 2020-03-25: 10 g via ORAL
  Filled 2020-03-25: qty 2

## 2020-03-25 NOTE — Progress Notes (Signed)
Progress Note  Patient Name: Raymond Barnett Date of Encounter: 03/25/2020  [facility] Cardiologist: Nelva Bush, MD   Subjective   Breathing the same compared to yesterday.  Currently on high flow nasal oxygen and also nonrebreather.  Inpatient Medications    Scheduled Meds: . amiodarone  400 mg Oral BID  . apixaban  5 mg Oral BID  . calcium-vitamin D  1 tablet Oral Daily  . Chlorhexidine Gluconate Cloth  6 each Topical Daily  . cholecalciferol  1,000 Units Oral Daily  . digoxin  0.25 mg Intravenous Once  . [START ON 2020/03/28] digoxin  0.25 mg Intravenous Once  . digoxin  0.5 mg Intravenous Once  . docusate sodium  100 mg Oral BID  . feeding supplement  237 mL Oral TID BM  . finasteride  5 mg Oral Daily  . melatonin  5 mg Oral QHS  . methylPREDNISolone (SOLU-MEDROL) injection  40 mg Intravenous Q24H  . multivitamin with minerals  1 tablet Oral Daily  . pantoprazole  40 mg Oral Daily  . polyethylene glycol  17 g Oral QHS  . QUEtiapine  25 mg Oral QHS  . simethicone  80 mg Oral QID  . timolol  1 drop Both Eyes BID   Continuous Infusions:  PRN Meds: acetaminophen **OR** acetaminophen, alum & mag hydroxide-simeth, bisacodyl, fluticasone, ipratropium-albuterol, methocarbamol, morphine injection, ondansetron **OR** ondansetron (ZOFRAN) IV, polyvinyl alcohol, zolpidem   Vital Signs    Vitals:   03/25/20 0800 03/25/20 0811 03/25/20 1000 03/25/20 1100  BP: 120/66  122/78 97/70  Pulse: 69  (!) 110 (!) 50  Resp: (!) 26  (!) 33 (!) 30  Temp: 98 F (36.7 C)     TempSrc: Axillary     SpO2: 91% 91% (!) 89% 93%  Weight:      Height:        Intake/Output Summary (Last 24 hours) at 03/25/2020 1206 Last data filed at 03/25/2020 0600 Gross per 24 hour  Intake 200 ml  Output 750 ml  Net -550 ml   Last 3 Weights 02/25/2020 03/06/2020 03/05/2020  Weight (lbs) 205 lb 225 lb 12 oz 222 lb 7.1 oz  Weight (kg) 92.987 kg 102.4 kg 100.9 kg      Telemetry    Atrial  fibrillation heart rate 99-102- Personally Reviewed  ECG    No new tracing reviewed- Personally Reviewed  Physical Exam   GEN:  Mild to moderate respiratory distress.   Neck: No JVD Cardiac:  Irregular irregular Respiratory:  Crackles at bases GI: Soft, nontender, non-distended  MS: No edema; No deformity. Neuro:  Nonfocal  Psych: Normal affect   Labs    High Sensitivity Troponin:   Recent Labs  Lab 03/03/20 0020 03/02/2020 0753 03/08/2020 1046 03/03/2020 1242  TROPONINIHS 49* 159* 244* 294*      Chemistry Recent Labs  Lab 03/23/20 0654 03/24/20 0525 03/25/20 0505  NA 132* 134* 135  K 3.9 4.5 5.3*  CL 93* 91* 91*  CO2 31 32 30  GLUCOSE 119* 122* 115*  BUN 49* 44* 51*  CREATININE 1.38* 1.14 1.25*  CALCIUM 8.7* 8.8* 9.0  PROT 5.7* 6.0* 6.1*  ALBUMIN 2.5* 2.5* 2.4*  AST 46* 57* 63*  ALT 30 39 46*  ALKPHOS 107 121 123  BILITOT 1.7* 1.5* 1.6*  GFRNONAA 49* >60 55*  ANIONGAP 8 11 14      Hematology Recent Labs  Lab 03/23/20 0654 03/24/20 0525 03/25/20 0505  WBC 15.0* 15.2* 17.7*  RBC 3.02*  2.94* 3.15*  HGB 9.7* 9.5* 9.9*  HCT 30.1* 29.7* 31.7*  MCV 99.7 101.0* 100.6*  MCH 32.1 32.3 31.4  MCHC 32.2 32.0 31.2  RDW 24.4* 24.1* 25.4*  PLT 165 180 233    BNP No results for input(s): BNP, PROBNP in the last 168 hours.   DDimer No results for input(s): DDIMER in the last 168 hours.   Radiology    No results found.  Cardiac Studies   Echo 03/17/20  1. Left ventricular ejection fraction, by estimation, is 50 to 55%. The  left ventricle has low normal function. The left ventricle has no regional  wall motion abnormalities. There is moderate left ventricular hypertrophy.  Left ventricular diastolic  function could not be evaluated.  2. Right ventricular systolic function is low normal. The right  ventricular size is mildly enlarged.  3. Left atrial size was mildly dilated.  4. Right atrial size was moderately dilated.  5. The mitral valve is  degenerative. Trivial mitral valve regurgitation.  6. The aortic valve is tricuspid. Aortic valve regurgitation is mild.  Patient Profile     86 y.o. male with history of HFpEF, COPD, PAD, hypertension presenting with shortness of breath and cough, found to have multifocal pneumonia and pulmonary edema.  Hospital course complicated by new onset atrial fibrillation respiratory dysfunction.  Assessment & Plan    1.   Persistent atrial fibrillation,  -HR <110 ok.  Pulmonary pathology driving A. fib/heart rates. 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 -Plan to titrate off amiodarone if heart rate controlled on digoxin due to pulmonary pathology/ILD -CHA2DS2-VASc of 5 -Eliquis.  2. HFpEF -Patient appears euvolemic -Minimal net negative of 550 cc. -Creatinine stable -Shortness of breath likely due to ILD.  3.  PAD -Aspirin -Patient previously self discontinued statin.  4.  Multifocal pneumonia, sepsis, COPD, ILD -On high flow nasal oxygen, nonrebreather -IV antibiotics as per ICU team -Inhalers COPD management as per ICU team   Total encounter time 35 minutes  Greater than 50% was spent in counseling and coordination of care with the patient     Signed, Kate Sable, MD  03/25/2020, 12:06 PM

## 2020-03-25 NOTE — Progress Notes (Signed)
PHARMACY CONSULT NOTE  Pharmacy Consult for Electrolyte Monitoring and Replacement   Recent Labs: Potassium (mmol/L)  Date Value  03/25/2020 5.3 (H)   Magnesium (mg/dL)  Date Value  03/25/2020 2.4   Calcium (mg/dL)  Date Value  03/25/2020 9.0   Albumin (g/dL)  Date Value  03/25/2020 2.4 (L)  02/16/2015 3.8   Phosphorus (mg/dL)  Date Value  03/25/2020 3.8   Sodium (mmol/L)  Date Value  03/25/2020 135  12/29/2019 136    Assessment: 86 year old male with PMHx of arthritis, lumbar degenerative disc disease, glaucoma, HTN, COPD, HFpEF, moderate mitral regurgitation, PAD, recent admission 11/11-11/16 for right fracture of femoral neck s/p right arthroplasty on 11/12 admitted with acute on chronic hypoxic respiratory failure secondary to CHF exacerbation, A-fib with RVR  Diuretics: furosemide 60 mg IV q12h (d/c 11/30 given bump in Scr)  Goal of Therapy:  Potassium 4.0 - 5.1 mmol/L Magnesium 2.0 - 2.4 mg/dL All Other Electrolytes WNL  Plan:   No electrolyte replacement needed  Re-check electrolytes in AM and replace as needed  Tawnya Crook, PharmD 03/25/2020 7:34 AM

## 2020-03-25 NOTE — Progress Notes (Signed)
MD notified of patients oxygen saturations down to 77-80% on HFNC 60L at 100% and NRB in place. RT at bedside. Lasix  given at 2202 and PRN morphine last given at 2208. Acknowledged, no new orders at this time. MD states that he will call and update family.

## 2020-03-25 NOTE — Progress Notes (Signed)
MD notified of BNP 995 and IVF infusing at 31ml/hr. Verbal order to stop IVF now and MD will enter order for Lasix.

## 2020-03-25 NOTE — Progress Notes (Signed)
Patient ID: Raymond Barnett, male   DOB: December 27, 1929, 86 y.o.   MRN: [id number] [facility] PROGRESS NOTE  Andras Grunewald Iten VEH:[id number] DOB: 22-Sep-1929 DOA: 03/01/2020 PCP: [name], DO  HPI/Subjective: Patient states that he is a little bloating.  Has shortness of breath.  Poor appetite.  With shortness of breath.  Objective: Vitals:   03/25/20 1000 03/25/20 1100  BP: 122/78 97/70  Pulse: (!) 110 (!) 50  Resp: (!) 33 (!) 30  Temp:    SpO2: (!) 89% 93%    Intake/Output Summary (Last 24 hours) at 03/25/2020 1333 Last data filed at 03/25/2020 0600 Gross per 24 hour  Intake 200 ml  Output 750 ml  Net -550 ml   Filed Weights   03/17/2020 0735  Weight: 93 kg    ROS: Review of Systems  Respiratory: Positive for shortness of breath.   Cardiovascular: Negative for chest pain.  Gastrointestinal: Negative for abdominal pain, nausea and vomiting.   Exam: Physical Exam HENT:     Head: Normocephalic.     Mouth/Throat:     Pharynx: No oropharyngeal exudate.  Eyes:     General: Lids are normal.     Conjunctiva/sclera: Conjunctivae normal.     Pupils: Pupils are equal, round, and reactive to light.  Cardiovascular:     Rate and Rhythm: Normal rate and regular rhythm.     Heart sounds: Normal heart sounds, S1 normal and S2 normal.  Pulmonary:     Breath sounds: Examination of the right-middle field reveals decreased breath sounds. Examination of the left-middle field reveals decreased breath sounds. Examination of the right-lower field reveals decreased breath sounds and rhonchi. Examination of the left-lower field reveals decreased breath sounds and rhonchi. Decreased breath sounds and rhonchi present. No wheezing or rales.  Abdominal:     Palpations: Abdomen is soft.     Tenderness: There is no abdominal tenderness.  Musculoskeletal:     Right ankle: No swelling.     Left ankle: No swelling.  Skin:    General: Skin is warm.     Findings: No rash.   Neurological:     Mental Status: He is alert.     Comments: Patient answers all questions.       Data Reviewed: Basic Metabolic Panel: Recent Labs  Lab 03/21/20 0533 03/22/20 0612 03/23/20 0654 03/24/20 0525 03/25/20 0505  NA 134* 134* 132* 134* 135  K 4.1 3.7 3.9 4.5 5.3*  CL 93* 93* 93* 91* 91*  CO2 29 32 31 32 30  GLUCOSE 121* 142* 119* 122* 115*  BUN 60* 59* 49* 44* 51*  CREATININE 1.70* 1.50* 1.38* 1.14 1.25*  CALCIUM 8.6* 8.5* 8.7* 8.8* 9.0  MG 2.0 2.1 2.2 2.4 2.4  PHOS 2.7 2.5 2.1* 2.3* 3.8   Liver Function Tests: Recent Labs  Lab 03/20/20 0716 03/21/20 0533 03/23/20 0654 03/24/20 0525 03/25/20 0505  AST 48*  --  46* 57* 63*  ALT 34  --  30 39 46*  ALKPHOS 93  --  107 121 123  BILITOT 2.0*  --  1.7* 1.5* 1.6*  PROT 5.7*  --  5.7* 6.0* 6.1*  ALBUMIN 2.7* 2.8* 2.5* 2.5* 2.4*   CBC: Recent Labs  Lab 03/21/20 0533 03/22/20 0612 03/23/20 0654 03/24/20 0525 03/25/20 0505  WBC 17.4* 15.0* 15.0* 15.2* 17.7*  HGB 9.4* 9.2* 9.7* 9.5* 9.9*  HCT 30.0* 28.7* 30.1* 29.7* 31.7*  MCV 100.0 99.3 99.7 101.0* 100.6*  PLT 188 183 165 180 233  BNP (last 3 results) Recent Labs    03/03/20 0020 03/04/20 0400 03/05/2020 0753  BNP 540.9* 409.8* 1,276.9*     Recent Results (from the past 240 hour(s))  Blood culture (routine x 2)     Status: None   Collection Time: 02/21/2020  7:53 AM   Specimen: BLOOD  Result Value Ref Range Status   Specimen Description BLOOD RIGHT HAND  Final   Special Requests   Final    BOTTLES DRAWN AEROBIC AND ANAEROBIC Blood Culture adequate volume   Culture   Final    NO GROWTH 5 DAYS Performed at [facility], [address] [place], [place] [address]    Report Status 03/21/2020 FINAL  Final  Resp Panel by RT-PCR (Flu A&B, Covid) Nasopharyngeal Swab     Status: None   Collection Time: 03/12/2020  7:58 AM   Specimen: Nasopharyngeal Swab; Nasopharyngeal(NP) swabs in vial transport medium  Result Value Ref Range Status    SARS Coronavirus 2 by RT PCR NEGATIVE NEGATIVE Final    Comment: (NOTE) SARS-CoV-2 target nucleic acids are NOT DETECTED.  The SARS-CoV-2 RNA is generally detectable in upper respiratory specimens during the acute phase of infection. The lowest concentration of SARS-CoV-2 viral copies this assay can detect is 138 copies/mL. A negative result does not preclude SARS-Cov-2 infection and should not be used as the sole basis for treatment or other patient management decisions. A negative result may occur with  improper specimen collection/handling, submission of specimen other than nasopharyngeal swab, presence of viral mutation(s) within the areas targeted by this assay, and inadequate number of viral copies(<138 copies/mL). A negative result must be combined with clinical observations, patient history, and epidemiological information. The expected result is Negative.  Fact Sheet for Patients:  EntrepreneurPulse.com.au  Fact Sheet for Healthcare Providers:  IncredibleEmployment.be  This test is no t yet approved or cleared by the [place] FDA and  has been authorized for detection and/or diagnosis of SARS-CoV-2 by FDA under an Emergency Use Authorization (EUA). This EUA will remain  in effect (meaning this test can be used) for the duration of the COVID-19 declaration under Section 564(b)(1) of the Act, 21 U.S.C.section 360bbb-3(b)(1), unless the authorization is terminated  or revoked sooner.       Influenza A by PCR NEGATIVE NEGATIVE Final   Influenza B by PCR NEGATIVE NEGATIVE Final    Comment: (NOTE) The Xpert Xpress SARS-CoV-2/FLU/RSV plus assay is intended as an aid in the diagnosis of influenza from Nasopharyngeal swab specimens and should not be used as a sole basis for treatment. Nasal washings and aspirates are unacceptable for Xpert Xpress SARS-CoV-2/FLU/RSV testing.  Fact Sheet for  Patients: EntrepreneurPulse.com.au  Fact Sheet for Healthcare Providers: IncredibleEmployment.be  This test is not yet approved or cleared by the [place] FDA and has been authorized for detection and/or diagnosis of SARS-CoV-2 by FDA under an Emergency Use Authorization (EUA). This EUA will remain in effect (meaning this test can be used) for the duration of the COVID-19 declaration under Section 564(b)(1) of the Act, 21 U.S.C. section 360bbb-3(b)(1), unless the authorization is terminated or revoked.  Performed at [facility], [address] [place],  [address]   Blood culture (routine x 2)     Status: None   Collection Time: 02/24/2020  7:58 AM   Specimen: BLOOD  Result Value Ref Range Status   Specimen Description BLOOD LEFT [facility]  Final   Special Requests   Final    BOTTLES DRAWN  AEROBIC AND ANAEROBIC Blood Culture results may not be optimal due to an inadequate volume of blood received in culture bottles   Culture   Final    NO GROWTH 5 DAYS Performed at [facility], [address] [place], [place] [address]    Report Status 03/21/2020 FINAL  Final  MRSA PCR Screening     Status: None   Collection Time: 03/17/20  9:51 AM   Specimen: Nasal Mucosa; Nasopharyngeal  Result Value Ref Range Status   MRSA by PCR NEGATIVE NEGATIVE Final    Comment:        The GeneXpert MRSA Assay (FDA approved for NASAL specimens only), is one component of a comprehensive MRSA colonization surveillance program. It is not intended to diagnose MRSA infection nor to guide or monitor treatment for MRSA infections. Performed at [facility], [address] [place], [place] [address]   Expectorated sputum assessment w rflx to resp cult     Status: None   Collection Time: 03/20/20  2:56 PM   Specimen: Expectorated Sputum  Result Value Ref Range Status   Specimen Description EXPECTORATED SPUTUM  Final   Special  Requests NONE  Final   Sputum evaluation   Final    THIS SPECIMEN IS ACCEPTABLE FOR SPUTUM CULTURE Performed at [facility], [address], [place], [place] [address]    Report Status 03/20/2020 FINAL  Final  Culture, respiratory     Status: None   Collection Time: 03/20/20  2:56 PM  Result Value Ref Range Status   Specimen Description   Final    EXPECTORATED SPUTUM Performed at [facility], [address], [place], [place] [address]    Special Requests   Final    NONE Reflexed from [id number] Performed at [facility], [address] [place], [place] [address]    Gram Stain   Final    NO WBC SEEN FEW SQUAMOUS EPITHELIAL CELLS PRESENT ABUNDANT GRAM POSITIVE COCCI FEW GRAM NEGATIVE RODS FEW GRAM POSITIVE RODS    Culture   Final    Normal respiratory flora-no Staph aureus or Pseudomonas seen Performed at [facility], [place] [address] [place], [place] [address]    Report Status 03/23/2020 FINAL  Final  Body fluid culture     Status: None   Collection Time: 03/20/20  4:00 PM   Specimen: PATH Cytology Pleural fluid  Result Value Ref Range Status   Specimen Description   Final    PLEURAL Performed at [facility], [address], [place], [place] [address]    Special Requests   Final    NONE Performed at [facility], [address] [place], [place] [address]    Gram Stain   Final    RARE WBC PRESENT,BOTH PMN AND MONONUCLEAR NO ORGANISMS SEEN    Culture   Final    NO GROWTH 3 DAYS Performed at [facility], [place] [address], [place],  [address]    Report Status 03/24/2020 FINAL  Final  Fungus Culture With Stain     Status: None (Preliminary result)   Collection Time: 03/20/20  4:00 PM   Specimen: PATH Cytology Pleural fluid  Result Value Ref Range Status   Fungus Stain Final report  Final    Comment: (NOTE) Performed At: [facility] [place], [place] [id number] Rush Farmer  MD [id number]    Fungus (Mycology) Culture PENDING  Incomplete   Fungal Source PLEURAL  Final  Comment: Performed at [facility], [address] [place], [place] [address]  Acid Fast Smear (AFB)     Status: None   Collection Time: 03/20/20  4:00 PM   Specimen: PATH Cytology Pleural fluid  Result Value Ref Range Status   AFB Specimen Processing Concentration  Final   Acid Fast Smear Negative  Final    Comment: (NOTE) Performed At: [facility] [place], [place] [id number] Rush Farmer MD [id number]    Source (AFB) PLEURAL  Final    Comment: Performed at [facility], [address] [place], [place] [address]  Fungus Culture Result     Status: None   Collection Time: 03/20/20  4:00 PM  Result Value Ref Range Status   Result 1 Comment  Final    Comment: (NOTE) KOH/Calcofluor preparation:  no fungus observed. Performed At: [facility] [place], [place] [id number] Rush Farmer MD [id number]   Body fluid culture     Status: None   Collection Time: 03/21/20 11:49 AM   Specimen: PATH Cytology Pleural fluid  Result Value Ref Range Status   Specimen Description   Final    PLEURAL Performed at [facility], [address] [place], [place] [address]    Special Requests   Final    NONE Performed at [facility], [address] [place], [place] [address]    Gram Stain   Final    RARE WBC PRESENT, PREDOMINANTLY MONONUCLEAR NO ORGANISMS SEEN    Culture   Final    NO GROWTH 3 DAYS Performed at [facility] [address], [place], [place] [address]    Report Status 03/24/2020 FINAL  Final  Fungus Culture With Stain     Status: None (Preliminary result)   Collection Time: 03/21/20 11:49 AM   Specimen: PATH Cytology Pleural fluid  Result Value Ref Range Status   Fungus Stain Final report  Final    Comment: (NOTE) Performed At: [facility] [place], [place] [id number] Rush Farmer MD [id number]    Fungus (Mycology) Culture PENDING  Incomplete   Fungal Source PLEURAL  Final    Comment: Performed at [facility], [address] [place], [place] [address]  Fungus Culture Result     Status: None   Collection Time: 03/21/20 11:49 AM  Result Value Ref Range Status   Result 1 Comment  Final    Comment: (NOTE) KOH/Calcofluor preparation:  no fungus observed. Performed At: [facility] [place], [place] [id number] Rush Farmer MD [id number]   Resp Panel by RT-PCR (Flu A&B, Covid) Nasopharyngeal Swab     Status: None   Collection Time: 03/23/20 10:34 AM   Specimen: Nasopharyngeal Swab; Nasopharyngeal(NP) swabs in vial transport medium  Result Value Ref Range Status   SARS Coronavirus 2 by RT PCR NEGATIVE NEGATIVE Final    Comment: (NOTE) SARS-CoV-2 target nucleic acids are NOT DETECTED.  The SARS-CoV-2 RNA is generally detectable in upper respiratory specimens during the acute phase of infection. The lowest concentration of SARS-CoV-2 viral copies this assay can detect is 138 copies/mL. A negative result does not preclude SARS-Cov-2 infection and should not be used as the sole basis for treatment or other patient management decisions. A negative result may occur with  improper specimen collection/handling, submission of specimen other than nasopharyngeal swab, presence of viral mutation(s) within the areas targeted by this assay, and inadequate number of viral copies(<138 copies/mL). A  negative result must be combined with clinical observations, patient history, and epidemiological information. The expected result is Negative.  Fact Sheet for Patients:  EntrepreneurPulse.com.au  Fact Sheet for Healthcare Providers:  IncredibleEmployment.be  This test is no t yet approved or cleared by the [place] FDA and  has been authorized for detection  and/or diagnosis of SARS-CoV-2 by FDA under an Emergency Use Authorization (EUA). This EUA will remain  in effect (meaning this test can be used) for the duration of the COVID-19 declaration under Section 564(b)(1) of the Act, 21 U.S.C.section 360bbb-3(b)(1), unless the authorization is terminated  or revoked sooner.       Influenza A by PCR NEGATIVE NEGATIVE Final   Influenza B by PCR NEGATIVE NEGATIVE Final    Comment: (NOTE) The Xpert Xpress SARS-CoV-2/FLU/RSV plus assay is intended as an aid in the diagnosis of influenza from Nasopharyngeal swab specimens and should not be used as a sole basis for treatment. Nasal washings and aspirates are unacceptable for Xpert Xpress SARS-CoV-2/FLU/RSV testing.  Fact Sheet for Patients: EntrepreneurPulse.com.au  Fact Sheet for Healthcare Providers: IncredibleEmployment.be  This test is not yet approved or cleared by the [place] FDA and has been authorized for detection and/or diagnosis of SARS-CoV-2 by FDA under an Emergency Use Authorization (EUA). This EUA will remain in effect (meaning this test can be used) for the duration of the COVID-19 declaration under Section 564(b)(1) of the Act, 21 U.S.C. section 360bbb-3(b)(1), unless the authorization is terminated or revoked.  Performed at [facility], [address], [place], [place] [address]      Studies: No results found.  Scheduled Meds: . amiodarone  400 mg Oral BID  . apixaban  5 mg Oral BID  . calcium-vitamin D  1 tablet Oral Daily  . Chlorhexidine Gluconate Cloth  6 each Topical Daily  . cholecalciferol  1,000 Units Oral Daily  . digoxin  0.25 mg Intravenous Once  . [START ON 29-Mar-2020] digoxin  0.25 mg Intravenous Once  . docusate sodium  100 mg Oral BID  . feeding supplement  237 mL Oral TID BM  . finasteride  5 mg Oral Daily  . melatonin  5 mg Oral QHS  . methylPREDNISolone (SOLU-MEDROL) injection  40 mg  Intravenous Q24H  . multivitamin with minerals  1 tablet Oral Daily  . pantoprazole  40 mg Oral Daily  . polyethylene glycol  17 g Oral QHS  . QUEtiapine  25 mg Oral QHS  . simethicone  80 mg Oral QID  . timolol  1 drop Both Eyes BID   Continuous Infusions:  Assessment/Plan:   1. Acute hypoxic respiratory failure secondary to pulmonary fibrosis, CHF, pneumonia and pleural effusions.  Patient had a room air saturation of 82% on presentation (less than 90%).  Patient's respiratory status has worsened over the last few days and is on heated high flow nasal cannula 100% oxygen with 100% nonrebreather.  2 Covid test were negative.  Patient had bilateral thoracentesis.  Continue incentive spirometer.  Continue empiric steroids.  As needed Roxanol for air hunger.  Palliative care to reassess on Monday. 2. Severe sepsis, present on admission with multifocal pneumonia.  Patient completed full course of antibiotics Rocephin and Zithromax.  Blood cultures and pleural fluid cultures negative for infection.  Sputum culture normal flora.  Covid testing negative x2. 3. Acute on chronic diastolic congestive heart failure bilateral pleural effusions.  Patient diuresed initially then creatinine worsened and diuresis was held.  Had intermittent diuresis yesterday.  Patient status post bilateral thoracentesis. 4. Persistent atrial fibrillation on Eliquis for anticoagulation and amiodarone for rate control. 5. Acute renal failure.  Creatinine increased by 0.53 (greater than 0.3 mg/dL within 48 hours from 11/28-11/30.  Creatinine slightly worse today at 1.25. 6. Weakness.  Continue physical therapy if able to do so 7. Acquired thrombophilia secondary to atrial fibrillation on Eliquis to reduce stroke risk. 8. Abdominal bloating.  Simethicone        Code Status:     Code Status Orders  (From admission, onward)         Start     Ordered   03/02/2020 1026  Do not attempt resuscitation (DNR)  Continuous        Question Answer Comment  In the event of cardiac or respiratory ARREST Do not call a "code blue"   In the event of cardiac or respiratory ARREST Do not perform Intubation, CPR, defibrillation or ACLS   In the event of cardiac or respiratory ARREST Use medication by any route, position, wound care, and other measures to relive pain and suffering. May use oxygen, suction and manual treatment of airway obstruction as needed for comfort.      03/02/2020 1029        Code Status History    Date Active Date Inactive Code Status Order ID Comments User Context   03/03/2020 0037 03/07/2020 2337 DNR [id number]  Athena Masse, MD ED   03/02/2020 2336 03/03/2020 0037 Full Code [id number]  Athena Masse, MD ED   Advance Care Planning Activity    Advance Directive Documentation     Most Recent Value  Type of Advance Directive Out of facility DNR (pink MOST or yellow form)  Pre-existing out of facility DNR order (yellow form or pink MOST form) Yellow form placed in chart (order not valid for inpatient use)  "MOST" Form in Place? --     Family Communication: Spoke with patient's daughter on the phone Disposition Plan: Status is: Inpatient  Dispo: The patient is from: Home              Anticipated d/c is to: To be determined              Anticipated d/c date is: To be determined based on clinical course on way too much oxygen right now.              Patient currently not medically stable for disposition.  Family to watch through the weekend and see if improves.  If not they may consider comfort care.  Consultants:  Critical care specialist  Cardiologist  Time spent: 27 minutes, case discussed with critical care specialist  Berlinda Farve [facility]  [facility]

## 2020-03-25 NOTE — Evaluation (Signed)
Clinical/Bedside Swallow Evaluation Patient Details  Name: Raymond Barnett MRN: [id number] Date of Birth: 08/09/1929  Today's Date: 03/25/2020 Time: SLP Start Time (ACUTE ONLY): 1455 SLP Stop Time (ACUTE ONLY): 1518 SLP Time Calculation (min) (ACUTE ONLY): 23 min  Past Medical History:  Past Medical History:  Diagnosis Date  . (HFpEF) heart failure with improved ejection fraction ([place])    a. 08/2017 Echo: EF 45-50%; b. 10/2019 Echo: EF 55-60%. No rwma, mild LVH, Gr2 DD, RVSP 47.75mmHg. Mod dil LA. Mod MR. Asc Ao 6mm.  . Arthritis   . Benign prostate hyperplasia    lower urinary tract symptoms  . Cataracts, bilateral   . COPD (chronic obstructive pulmonary disease) ([place])   . DDD (degenerative disc disease), lumbar   . Glaucoma   . H/O carpal tunnel syndrome   . History of osteomyelitis 1953   right leg  . Hypertension   . Moderate mitral regurgitation    a. 10/2019 Echo: Mod MR.  Marland Kitchen PAD (peripheral artery disease) ([place])    a. 08/2017 LE ABI & Duplex: ABI R 0.60, L 1.07. 30-49% R popliteal and 50-74% R PT stenoses.  . Thrombocytopenia ([place])    Past Surgical History:  Past Surgical History:  Procedure Laterality Date  . CARPAL TUNNEL RELEASE Left   . CARPAL TUNNEL RELEASE Right   . COLONOSCOPY  2015   Dr. Allen Norris  . HIP ARTHROPLASTY Right 03/03/2020   Procedure: ARTHROPLASTY BIPOLAR HIP (HEMIARTHROPLASTY);  Surgeon: Hessie Knows, MD;  Location: [facility] ORS;  Service: Orthopedics;  Laterality: Right;  . LEG SURGERY Right 1953  . SKIN GRAFT Right 05/01/2017   right foot at [facility]  . TOTAL KNEE ARTHROPLASTY Right    HPI:  Pt admitted 2/2 SOB and hypoxia from [facility]. Found to have CHF with acute hypoxic respiratory failure. Pt was recently d/c on 03/07/20 following operative repair of R hip fx (s/p R hip hemiarthroplasty). He has PMHx significant for HTN, DDD, and arthritis. On 03/20/2020, pt had right thoracentesis yielding 450mL of pleural fluid and on 03/21/2020 pt had left  thoracentesis yielding 550cc of pleural fluid. Pt's respiratory compromise persists despite intervention. ST consulted d/t family concern for possible aspiration.    Assessment / Plan / Recommendation Clinical Impression  At bedside pt presents with grossly adequate oropharyngeal abilities when consuming thin liquids via cup, puree and regular solids. When consuming thin liquids via straw but had a very subtle throat clear in 2 out 8 sips. Out of an abundance of caution recommend NO STRAW. Although pt demonstrated adequate oropharyngeal abilities, he is at an [place] given his current oxygen demands. Pt was resting with a non-rebreather over his HFNC with sats: HR 106, RR26, O2 91%. When nonbreather removed pt's sats remained stable. Over the course of the evaluation pt's sats fluctuated greatly with more severe recordings of RR 46, HR 120, O2 86%. He fluctuated with simple responses to orientation questions as well as at rest. After 23 minutes of interaction, pt's O2 sats were 84% and remained for 10 minutes after until non-rebreather placed. At this time, pt's respiratory compromise places him at increased risk of aspiration and he also states that he isn't hungry. Pt's nurse confirms that pt has not consumed adequate POs during hospitalization. Unfortunately skilled ST is not able to impact his overall prognosis. SLP Visit Diagnosis: Dysphagia, unspecified (R13.10)    Aspiration Risk  Severe aspiration risk;Risk for inadequate nutrition/hydration (d/t severity of respiratory compromise)    Diet Recommendation  Regular;Thin liquid   Liquid Administration via: Cup;No straw Medication Administration: Whole meds with liquid Supervision: Staff to assist with self feeding;Full supervision/cueing for compensatory strategies Compensations: Minimize environmental distractions;Slow rate;Small sips/bites Postural Changes: Seated upright at 90 degrees    Other  Recommendations Oral  Care Recommendations: Oral care BID   Follow up Recommendations None (Given lack of PO intake, recommend Palliative Care/Hospice)        Swallow Study   General Date of Onset: 03/14/2020 HPI: Pt admitted 2/2 SOB and hypoxia from [facility]. Found to have CHF with acute hypoxic respiratory failure. Pt was recently d/c on 03/07/20 following operative repair of R hip fx (s/p R hip hemiarthroplasty). He has PMHx significant for HTN, DDD, and arthritis. On 03/20/2020, pt had right thoracentesis yielding 474mL of pleural fluid and on 03/21/2020 pt had left thoracentesis yielding 550cc of pleural fluid. Pt's respiratory compromise persists despite intervention. ST consulted d/t family concern for possible aspiration.  Type of Study: Bedside Swallow Evaluation Previous Swallow Assessment: none in chart Diet Prior to this Study: Regular;Thin liquids Temperature Spikes Noted: No Respiratory Status: Non-rebreather (HFNC) History of Recent Intubation: No Behavior/Cognition: Lethargic/Drowsy;Pleasant mood Oral Cavity Assessment: Within Functional Limits Oral Care Completed by SLP: Recent completion by staff Oral Cavity - Dentition: Adequate natural dentition Vision: Functional for self-feeding Self-Feeding Abilities: Needs assist;Needs set up Patient Positioning: Upright in bed Baseline Vocal Quality: Low vocal intensity Volitional Cough: Weak Volitional Swallow: Able to elicit    Oral/Motor/Sensory Function Overall Oral Motor/Sensory Function: Within functional limits   Ice Chips Ice chips: Within functional limits Presentation: Spoon   Thin Liquid Thin Liquid: Within functional limits Presentation: Straw;Cup;Self Fed Other Comments: pt with intermittent very subtle throat clear with straw,    Nectar Thick Nectar Thick Liquid: Not tested   Honey Thick Honey Thick Liquid: Not tested   Puree Puree: Within functional limits Presentation: Spoon   Solid     Solid: Within functional  limits Presentation: Self Fed     Brett Soza B. Rutherford Nail M.S., CCC-SLP, [place] [facility] [place] 03/25/2020,5:02 PM

## 2020-03-25 NOTE — Progress Notes (Signed)
Follow up - Critical Care Medicine Note  Patient Details:    Raymond Barnett is an 86 y.o. male known to [facility] with tree of COPD (emphysema) and pulmonary fibrosis.  Patient had a right hip fracture noted 03/03/19/2021 patient underwent arthroplasty on 03/03/2020.  Patient was eventually discharged to skilled nursing facility for rehabilitation.  However he presented on 25 November with increasing shortness of breath and cough productive of clear sputum.  Initial evaluation showed bilateral pleural effusions and elevated BNP.  Subsequently the patient has been noted to have bilateral interstitial and airspace disease noted.  Patient is now requiring high flow O2.  He also developed A. fib with RVR.  PCCM consulted to assist with management.   Lines, Airways, Drains: External Urinary Catheter (Active)  Collection Container Dedicated Suction Canister 03/24/20 1945  Securement Method Securing device (Describe) 03/24/20 1945  Site Assessment Clean;Intact 03/24/20 1945  Intervention Equipment Changed 03/24/20 1501  Output (mL) 300 mL 03/24/20 1900    Anti-infectives:  Anti-infectives (From admission, onward)   Start     Dose/Rate Route Frequency Ordered Stop   03/20/20 1415  cefTRIAXone (ROCEPHIN) 1 g in sodium chloride 0.9 % 100 mL IVPB  Status:  Discontinued        1 g 200 mL/hr over 30 Minutes Intravenous Every 24 hours 03/20/20 1322 03/20/20 1329   03/20/20 1415  azithromycin (ZITHROMAX) tablet 500 mg  Status:  Discontinued        500 mg Oral Daily 03/20/20 1322 03/24/20 1033   03/20/20 1415  cefTRIAXone (ROCEPHIN) 2 g in sodium chloride 0.9 % 100 mL IVPB        2 g 200 mL/hr over 30 Minutes Intravenous Every 24 hours 03/20/20 1329 03/24/20 1508   03/10/2020 2200  vancomycin (VANCOREADY) IVPB 1250 mg/250 mL  Status:  Discontinued        1,250 mg 166.7 mL/hr over 90 Minutes Intravenous Every 12 hours 03/15/2020 1029 03/17/20 1152   02/22/2020 1400  ceFEPIme (MAXIPIME) 2 g  in sodium chloride 0.9 % 100 mL IVPB  Status:  Discontinued        2 g 200 mL/hr over 30 Minutes Intravenous Every 8 hours 02/28/2020 1029 03/18/20 1300   03/19/2020 0930  vancomycin (VANCOREADY) IVPB 2000 mg/400 mL        2,000 mg 200 mL/hr over 120 Minutes Intravenous  Once 03/21/2020 0846 02/25/2020 1157   02/26/2020 0900  ceFEPIme (MAXIPIME) 2 g in sodium chloride 0.9 % 100 mL IVPB        2 g 200 mL/hr over 30 Minutes Intravenous  Once 03/19/2020 0846 03/14/2020 0951     Scheduled Meds: . amiodarone  400 mg Oral BID  . apixaban  5 mg Oral BID  . calcium-vitamin D  1 tablet Oral Daily  . Chlorhexidine Gluconate Cloth  6 each Topical Daily  . cholecalciferol  1,000 Units Oral Daily  . [START ON 31-Mar-2020] digoxin  0.25 mg Intravenous Once  . docusate sodium  100 mg Oral BID  . feeding supplement  237 mL Oral TID BM  . finasteride  5 mg Oral Daily  . furosemide  40 mg Intravenous Once  . melatonin  5 mg Oral QHS  . [START ON 03-31-2020] methylPREDNISolone (SOLU-MEDROL) injection  40 mg Intravenous Q12H  . multivitamin with minerals  1 tablet Oral Daily  . pantoprazole  40 mg Oral Daily  . polyethylene glycol  17 g Oral QHS  . QUEtiapine  25 mg  Oral QHS  . simethicone  80 mg Oral QID  . timolol  1 drop Both Eyes BID   Continuous Infusions: . dextrose 5 % and 0.45% NaCl 75 mL/hr at 03/25/20 1900   PRN Meds:.acetaminophen **OR** acetaminophen, alum & mag hydroxide-simeth, bisacodyl, fluticasone, ipratropium-albuterol, methocarbamol, morphine injection, ondansetron **OR** ondansetron (ZOFRAN) IV, polyvinyl alcohol, zolpidem   Results for orders placed or performed during the hospital encounter of 03/02/2020 (from the past 24 hour(s))  CBC     Status: Abnormal   Collection Time: 03/25/20  5:05 AM  Result Value Ref Range   WBC 17.7 (H) 4.0 - 10.5 K/uL   RBC 3.15 (L) 4.22 - 5.81 MIL/uL   Hemoglobin 9.9 (L) 13.0 - 17.0 g/dL   HCT 31.7 (L) 39 - 52 %   MCV 100.6 (H) 80.0 - 100.0 fL   MCH 31.4  26.0 - 34.0 pg   MCHC 31.2 30.0 - 36.0 g/dL   RDW 25.4 (H) 11.5 - 15.5 %   Platelets 233 150 - 400 K/uL   nRBC 0.6 (H) 0.0 - 0.2 %  Magnesium     Status: None   Collection Time: 03/25/20  5:05 AM  Result Value Ref Range   Magnesium 2.4 1.7 - 2.4 mg/dL  Phosphorus     Status: None   Collection Time: 03/25/20  5:05 AM  Result Value Ref Range   Phosphorus 3.8 2.5 - 4.6 mg/dL  Comprehensive metabolic panel     Status: Abnormal   Collection Time: 03/25/20  5:05 AM  Result Value Ref Range   Sodium 135 135 - 145 mmol/L   Potassium 5.3 (H) 3.5 - 5.1 mmol/L   Chloride 91 (L) 98 - 111 mmol/L   CO2 30 22 - 32 mmol/L   Glucose, Bld 115 (H) 70 - 99 mg/dL   BUN 51 (H) 8 - 23 mg/dL   Creatinine, Ser 1.25 (H) 0.61 - 1.24 mg/dL   Calcium 9.0 8.9 - 10.3 mg/dL   Total Protein 6.1 (L) 6.5 - 8.1 g/dL   Albumin 2.4 (L) 3.5 - 5.0 g/dL   AST 63 (H) 15 - 41 U/L   ALT 46 (H) 0 - 44 U/L   Alkaline Phosphatase 123 38 - 126 U/L   Total Bilirubin 1.6 (H) 0.3 - 1.2 mg/dL   GFR, Estimated 55 (L) >60 mL/min   Anion gap 14 5 - 15  Brain natriuretic peptide     Status: Abnormal   Collection Time: 03/25/20  6:35 PM  Result Value Ref Range   B Natriuretic Peptide 995.1 (H) 0.0 - 100.0 pg/mL    Microbiology: Results for orders placed or performed during the hospital encounter of 02/24/2020  Blood culture (routine x 2)     Status: None   Collection Time: 03/06/2020  7:53 AM   Specimen: BLOOD  Result Value Ref Range Status   Specimen Description BLOOD RIGHT HAND  Final   Special Requests   Final    BOTTLES DRAWN AEROBIC AND ANAEROBIC Blood Culture adequate volume   Culture   Final    NO GROWTH 5 DAYS Performed at [facility], [address] [place], [place] [address]    Report Status 03/21/2020 FINAL  Final  Resp Panel by RT-PCR (Flu A&B, Covid) Nasopharyngeal Swab     Status: None   Collection Time: 03/17/2020  7:58 AM   Specimen: Nasopharyngeal Swab; Nasopharyngeal(NP) swabs in vial transport  medium  Result Value Ref Range Status   SARS Coronavirus 2  by RT PCR NEGATIVE NEGATIVE Final    Comment: (NOTE) SARS-CoV-2 target nucleic acids are NOT DETECTED.  The SARS-CoV-2 RNA is generally detectable in upper respiratory specimens during the acute phase of infection. The lowest concentration of SARS-CoV-2 viral copies this assay can detect is 138 copies/mL. A negative result does not preclude SARS-Cov-2 infection and should not be used as the sole basis for treatment or other patient management decisions. A negative result may occur with  improper specimen collection/handling, submission of specimen other than nasopharyngeal swab, presence of viral mutation(s) within the areas targeted by this assay, and inadequate number of viral copies(<138 copies/mL). A negative result must be combined with clinical observations, patient history, and epidemiological information. The expected result is Negative.  Fact Sheet for Patients:  EntrepreneurPulse.com.au  Fact Sheet for Healthcare Providers:  IncredibleEmployment.be  This test is no t yet approved or cleared by the [place] FDA and  has been authorized for detection and/or diagnosis of SARS-CoV-2 by FDA under an Emergency Use Authorization (EUA). This EUA will remain  in effect (meaning this test can be used) for the duration of the COVID-19 declaration under Section 564(b)(1) of the Act, 21 U.S.C.section 360bbb-3(b)(1), unless the authorization is terminated  or revoked sooner.       Influenza A by PCR NEGATIVE NEGATIVE Final   Influenza B by PCR NEGATIVE NEGATIVE Final    Comment: (NOTE) The Xpert Xpress SARS-CoV-2/FLU/RSV plus assay is intended as an aid in the diagnosis of influenza from Nasopharyngeal swab specimens and should not be used as a sole basis for treatment. Nasal washings and aspirates are unacceptable for Xpert Xpress SARS-CoV-2/FLU/RSV testing.  Fact Sheet for  Patients: EntrepreneurPulse.com.au  Fact Sheet for Healthcare Providers: IncredibleEmployment.be  This test is not yet approved or cleared by the [place] FDA and has been authorized for detection and/or diagnosis of SARS-CoV-2 by FDA under an Emergency Use Authorization (EUA). This EUA will remain in effect (meaning this test can be used) for the duration of the COVID-19 declaration under Section 564(b)(1) of the Act, 21 U.S.C. section 360bbb-3(b)(1), unless the authorization is terminated or revoked.  Performed at [facility] [address] [place], [place] [address]   Blood culture (routine x 2)     Status: None   Collection Time: 03/15/2020  7:58 AM   Specimen: BLOOD  Result Value Ref Range Status   Specimen Description BLOOD LEFT AC  Final   Special Requests   Final    BOTTLES DRAWN AEROBIC AND ANAEROBIC Blood Culture results may not be optimal due to an inadequate volume of blood received in culture bottles   Culture   Final    NO GROWTH 5 DAYS Performed at [facility], [address] [place], [place] [address]    Report Status 03/21/2020 FINAL  Final  MRSA PCR Screening     Status: None   Collection Time: 03/17/20  9:51 AM   Specimen: Nasal Mucosa; Nasopharyngeal  Result Value Ref Range Status   MRSA by PCR NEGATIVE NEGATIVE Final    Comment:        The GeneXpert MRSA Assay (FDA approved for NASAL specimens only), is one component of a comprehensive MRSA colonization surveillance program. It is not intended to diagnose MRSA infection nor to guide or monitor treatment for MRSA infections. Performed at [facility], [address] [place], [place] [address]   Expectorated sputum assessment w rflx to resp cult     Status: None  Collection Time: 03/20/20  2:56 PM   Specimen: Expectorated Sputum  Result Value Ref Range Status   Specimen Description EXPECTORATED SPUTUM  Final   Special  Requests NONE  Final   Sputum evaluation   Final    THIS SPECIMEN IS ACCEPTABLE FOR SPUTUM CULTURE Performed at [facility], [address], [place], [place] [address]    Report Status 03/20/2020 FINAL  Final  Culture, respiratory     Status: None   Collection Time: 03/20/20  2:56 PM  Result Value Ref Range Status   Specimen Description   Final    EXPECTORATED SPUTUM Performed at [facility], [address], [place], [place] [address]    Special Requests   Final    NONE Reflexed from [id number] Performed at [facility], [address] [place], [place] [address]    Gram Stain   Final    NO WBC SEEN FEW SQUAMOUS EPITHELIAL CELLS PRESENT ABUNDANT GRAM POSITIVE COCCI FEW GRAM NEGATIVE RODS FEW GRAM POSITIVE RODS    Culture   Final    Normal respiratory flora-no Staph aureus or Pseudomonas seen Performed at [facility], [place] [address], [place], [place] [address]    Report Status 03/23/2020 FINAL  Final  Body fluid culture     Status: None   Collection Time: 03/20/20  4:00 PM   Specimen: PATH Cytology Pleural fluid  Result Value Ref Range Status   Specimen Description   Final    PLEURAL Performed at [facility], [address] [place], [place] [address]    Special Requests   Final    NONE Performed at [facility], [address] [place], [place] [address]    Gram Stain   Final    RARE WBC PRESENT,BOTH PMN AND MONONUCLEAR NO ORGANISMS SEEN    Culture   Final    NO GROWTH 3 DAYS Performed at [facility], [place] [address], [place], [place] [address]    Report Status 03/24/2020 FINAL  Final  Fungus Culture With Stain     Status: None (Preliminary result)   Collection Time: 03/20/20  4:00 PM   Specimen: PATH Cytology Pleural fluid  Result Value Ref Range Status   Fungus Stain Final report  Final    Comment: (NOTE) Performed At: [facility] [place], [place] [id number] Rush Farmer  MD [id number]    Fungus (Mycology) Culture PENDING  Incomplete   Fungal Source PLEURAL  Final    Comment: Performed at [facility], [address] [place], [place] [address]  Acid Fast Smear (AFB)     Status: None   Collection Time: 03/20/20  4:00 PM   Specimen: PATH Cytology Pleural fluid  Result Value Ref Range Status   AFB Specimen Processing Concentration  Final   Acid Fast Smear Negative  Final    Comment: (NOTE) Performed At: [facility] [place], [place] [id number] Rush Farmer MD [id number]    Source (AFB) PLEURAL  Final    Comment: Performed at [facility], [address] [place], [place] [address]  Fungus Culture Result     Status: None   Collection Time: 03/20/20  4:00 PM  Result Value Ref Range Status   Result 1 Comment  Final    Comment: (NOTE) KOH/Calcofluor preparation:  no fungus observed. Performed At: [facility] [place], [place] [id number] Rush Farmer MD [id number]   Body fluid culture  Status: None   Collection Time: 03/21/20 11:49 AM   Specimen: PATH Cytology Pleural fluid  Result Value Ref Range Status   Specimen Description   Final    PLEURAL Performed at [facility], [address] [place], [place] [address]    Special Requests   Final    NONE Performed at [facility], [address] [place], [place] [address]    Gram Stain   Final    RARE WBC PRESENT, PREDOMINANTLY MONONUCLEAR NO ORGANISMS SEEN    Culture   Final    NO GROWTH 3 DAYS Performed at [facility], [place] [address], [place], [place] [address]    Report Status 03/24/2020 FINAL  Final  Fungus Culture With Stain     Status: None (Preliminary result)   Collection Time: 03/21/20 11:49 AM   Specimen: PATH Cytology Pleural fluid  Result Value Ref Range Status   Fungus Stain Final report  Final    Comment: (NOTE) Performed At: [facility] [place], [place] [id number] Rush Farmer MD [id number]    Fungus (Mycology) Culture PENDING  Incomplete   Fungal Source PLEURAL  Final    Comment: Performed at [facility], [address] [place], [place] [address]  Fungus Culture Result     Status: None   Collection Time: 03/21/20 11:49 AM  Result Value Ref Range Status   Result 1 Comment  Final    Comment: (NOTE) KOH/Calcofluor preparation:  no fungus observed. Performed At: [facility] [place], [place] [id number] Rush Farmer MD [id number]   Resp Panel by RT-PCR (Flu A&B, Covid) Nasopharyngeal Swab     Status: None   Collection Time: 03/23/20 10:34 AM   Specimen: Nasopharyngeal Swab; Nasopharyngeal(NP) swabs in vial transport medium  Result Value Ref Range Status   SARS Coronavirus 2 by RT PCR NEGATIVE NEGATIVE Final    Comment: (NOTE) SARS-CoV-2 target nucleic acids are NOT DETECTED.  The SARS-CoV-2 RNA is generally detectable in upper respiratory specimens during the acute phase of infection. The lowest concentration of SARS-CoV-2 viral copies this assay can detect is 138 copies/mL. A negative result does not preclude SARS-Cov-2 infection and should not be used as the sole basis for treatment or other patient management decisions. A negative result may occur with  improper specimen collection/handling, submission of specimen other than nasopharyngeal swab, presence of viral mutation(s) within the areas targeted by this assay, and inadequate number of viral copies(<138 copies/mL). A negative result must be combined with clinical observations, patient history, and epidemiological information. The expected result is Negative.  Fact Sheet for Patients:  EntrepreneurPulse.com.au  Fact Sheet for Healthcare Providers:  IncredibleEmployment.be  This test is no t yet approved or cleared by the [place] FDA and  has been authorized for detection  and/or diagnosis of SARS-CoV-2 by FDA under an Emergency Use Authorization (EUA). This EUA will remain  in effect (meaning this test can be used) for the duration of the COVID-19 declaration under Section 564(b)(1) of the Act, 21 U.S.C.section 360bbb-3(b)(1), unless the authorization is terminated  or revoked sooner.       Influenza A by PCR NEGATIVE NEGATIVE Final   Influenza B by PCR NEGATIVE NEGATIVE Final    Comment: (NOTE) The Xpert Xpress SARS-CoV-2/FLU/RSV plus assay is intended as an aid in the diagnosis of influenza from Nasopharyngeal swab specimens and should not be used as a sole basis for treatment. Nasal washings and aspirates are unacceptable for  Xpert Xpress SARS-CoV-2/FLU/RSV testing.  Fact Sheet for Patients: EntrepreneurPulse.com.au  Fact Sheet for Healthcare Providers: IncredibleEmployment.be  This test is not yet approved or cleared by the [place] FDA and has been authorized for detection and/or diagnosis of SARS-CoV-2 by FDA under an Emergency Use Authorization (EUA). This EUA will remain in effect (meaning this test can be used) for the duration of the COVID-19 declaration under Section 564(b)(1) of the Act, 21 U.S.C. section 360bbb-3(b)(1), unless the authorization is terminated or revoked.  Performed at [facility], [address], [place], [place] [address]     Best Practice/Protocols:  VTE Prophylaxis: Direct Thrombin Inhibitor GI Prophylaxis: Proton Pump Inhibitor  Events: 03/20/20- Met with daughter, reviewed findings and care plan. Discussed short term plan with cardiology and hospitalist team.  03/21/20- s/p thoracentesis on left with 500cc straw colored fluid.  Patient feels slighly improved was smiling and shook my hand during examination.  12/1-patient improved with weaning of FIO2 , today  03/23/20- patient remains with elevated settings on HFNC. I spoke with daughter today.  Seems post  booster patient got severe bilateral pneumonia. Will ask ID to evaluate due to little change despite aggressive medical management.  IV Solu-Medrol started 40 mg daily 03/24/2020: Increasing FiO2 requirements, still dependent on high flow O2.  Discussed at length with family 03/25/2020: ANA positive, double-stranded DNA positive, rheumatoid factor elevated, KL 6 pending  Studies: DG Chest 2 View  Result Date: 03/02/2020 CLINICAL DATA:  Shortness of breath EXAM: CHEST - 2 VIEW COMPARISON:  October 12, 2019 FINDINGS: There is a small pleural effusion on each side with mild bibasilar atelectasis. The lungs elsewhere are clear. There is cardiomegaly with pulmonary vascularity normal. No adenopathy. There is aortic atherosclerosis. There is leftward deviation of the upper thoracic trachea. Bones appear osteoporotic. IMPRESSION: 1. Small pleural effusions bilaterally with bibasilar atelectasis. Lungs elsewhere clear. 2.  Cardiomegaly with pulmonary vascularity normal. 3. Deviation of the upper thoracic trachea toward the left. Question thyroid enlargement as etiology for this finding. 4.  Aortic Atherosclerosis (ICD10-I70.0). Electronically Signed   By: Lowella Grip III M.D.   On: 03/02/2020 13:03   CT Angio Chest PE W and/or Wo Contrast  Result Date: 03/03/2020 CLINICAL DATA:  Clinical high probability for pulmonary embolus. Shortness of breath. Abdominal distension and jaundice. EXAM: CT ANGIOGRAPHY CHEST CT ABDOMEN AND PELVIS WITH CONTRAST TECHNIQUE: Multidetector CT imaging of the chest was performed using the standard protocol during bolus administration of intravenous contrast. Multiplanar CT image reconstructions and MIPs were obtained to evaluate the vascular anatomy. Multidetector CT imaging of the abdomen and pelvis was performed using the standard protocol during bolus administration of intravenous contrast. CONTRAST:  27m OMNIPAQUE IOHEXOL 350 MG/ML SOLN COMPARISON:  CT chest 05/19/2017 abdominal  sonogram 11/12/2011 FINDINGS: CTA CHEST FINDINGS Cardiovascular: Satisfactory opacification of the pulmonary arteries to the segmental level. Respiratory motion artifact however diminishes exam detail within the lower lobe pulmonary arteries at the segmental and subsegmental levels. The main pulmonary artery is patent. No central obstructing embolus. No lobar or segmental pulmonary artery filling defects identified bilaterally. The ascending thoracic aorta appears increased in caliber measuring 4.2 cm. Aortic atherosclerosis and coronary artery calcifications. Mild cardiac enlargement. No pericardial effusion. Mediastinum/Nodes: Normal appearance of the thyroid gland. The trachea appears patent and is midline. Normal appearance of the esophagus. No enlarged axillary, supraclavicular or mediastinal lymph nodes. Prominent prevascular and right paratracheal nodes are identified measuring up to 1.3 cm. Lungs/Pleura: Moderate bilateral pleural effusions are identified.  Extensive interstitial and airspace disease is noted within the right upper lobe, left upper lobe, lingula and right middle lobe. Subsegmental atelectasis and volume loss noted within both lower lung zones. Musculoskeletal: The bones appear diffusely osteopenic. No acute or suspicious osseous findings. Review of the MIP images confirms the above findings. CT ABDOMEN and PELVIS FINDINGS Hepatobiliary: No suspicious liver abnormality. Mural calcifications are identified within the gallbladder fundus, image 27/4. No bile duct dilatation. Pancreas: Unremarkable. No pancreatic ductal dilatation or surrounding inflammatory changes. Spleen: Normal in size without focal abnormality. Adrenals/Urinary Tract: Normal adrenal glands. No hydronephrosis identified bilaterally. Bilateral kidney cysts are identified. The largest arises from the inferior pole of right kidney measuring 8.5 cm. There is a hyper dense lesion arising from the anterior cortex of the upper pole  of right kidney measuring 0.7 by 0.9 cm, image 28/4. Additional, subcentimeter kidney lesions are noted bilaterally which are too small to reliably characterize. No hydronephrosis identified bilaterally. No hydroureter or ureteral lithiasis. Stomach/Bowel: Stomach is nondistended. The appendix is visualized and appears normal. No abnormal bowel dilatation, inflammation or distension. Distal colonic diverticula identified without acute inflammation. Moderate stool burden noted throughout the colon Vascular/Lymphatic: Aortic atherosclerosis. No aneurysm. No abdominopelvic adenopathy. Reproductive: Prostate gland is obscured by streak artifact from right hip arthroplasty. Other: No significant free fluid or fluid collections. Musculoskeletal: Bilateral sacroiliitis noted, right greater than left. Previous right hip arthroplasty. Multi level degenerative disc disease is noted throughout the lumbar spine. This is most advanced at L5-S1. Acute appearing superior endplate deformity is noted at the L2 level. With loss of approximately 15% of the vertebral body height centrally. Review of the MIP images confirms the above findings. IMPRESSION: 1. No evidence for acute pulmonary embolus. 2. Bilateral pleural effusions with extensive bilateral interstitial and airspace disease identified. Findings are concerning for congestive heart failure. Superimposed multifocal pneumonia not excluded. 3. No acute findings within the abdomen or pelvis. 4. Mild acute appearing superior endplate deformity at L2. 5. There is a hyperdense lesion arising off the anterior cortex of the left kidney which does not meet criteria for simple cyst. This may represent a hemorrhagic cyst or solid enhancing lesion. When the patient is clinically stable and able to follow directions and hold their breath (preferably as an outpatient) further evaluation with dedicated abdominal MRI should be considered. 6. Bilateral kidney cysts. Aortic Atherosclerosis  (ICD10-I70.0). Electronically Signed   By: Kerby Moors M.D.   On: 03/06/2020 12:23   CT PELVIS WO CONTRAST  Result Date: 03/02/2020 CLINICAL DATA:  Hip pain for 10 days EXAM: CT PELVIS WITHOUT CONTRAST TECHNIQUE: Multidetector CT imaging of the pelvis was performed following the standard protocol without intravenous contrast. COMPARISON:  Radiograph 03/02/2020 FINDINGS: Urinary Tract:  Urinary bladder is unremarkable. Bowel: Sigmoid colon diverticula without acute inflammatory process. Negative appendix. No bowel wall thickening Vascular/Lymphatic: Extensive aortic atherosclerosis without aneurysm. No suspicious nodes Reproductive:  Mild prostate calcification without enlargement Other:  Negative for pelvic effusion Musculoskeletal: Mild degenerative changes of both hips with joint space narrowing and asymmetric femoral head neck junction. Chronic appearing deformity at the left iliac crest potentially due to remote trauma or postsurgical change. There is no fracture or associated soft tissue mass. There is fatty atrophy of adjacent gluteus musculature. No femoral head dislocation. Coronal images are suspicious for acute appearing nondisplaced right femoral neck fracture. Pubic symphysis and rami appear intact. IMPRESSION: 1. No definite acute osseous abnormality of left hip. Chronic appearing osseous deformity of left iliac crest without  evidence for bony destructive change or associated soft tissue mass. 2. Findings are suspicious for an acute nondisplaced right femoral neck fracture 3. Sigmoid colon diverticular disease without acute inflammatory change. Aortic Atherosclerosis (ICD10-I70.0). Electronically Signed   By: Donavan Foil M.D.   On: 03/02/2020 16:33   MR PELVIS WO CONTRAST  Result Date: 03/02/2020 CLINICAL DATA:  Right hip pain EXAM: MRI PELVIS WITHOUT CONTRAST TECHNIQUE: Multiplanar multisequence MR imaging of the pelvis was performed. No intravenous contrast was administered. COMPARISON:   CT 3:54 p.m., plain radiographs 12:36 p.m. FINDINGS: Urinary Tract: The distal ureters are decompressed. The bladder is unremarkable. Bowel: The visualized large and small bowel are unremarkable per save for severe sigmoid diverticulosis. Vascular/Lymphatic: No pathologic adenopathy within the abdomen and pelvis Reproductive:  No mass or other significant abnormality Other:  None significant Musculoskeletal: There is an acute, impacted subcapital right femoral neck fracture with mild varus angulation of the distal fracture fragment. The femoral head is still seated within the right acetabulum. Iliofemoral in pubic femoral ligaments are intact. Ligamentum teres is intact. There is mild bilateral degenerative hip arthritis. There is injury signal within the right gluteal musculature compatible with mild strain. Similar changes are noted within the left gluteus maximus muscle. There is increased signal compatible with greater trochanteric bursitis on the right. There is increased signal within the visualized proximal vastus lateralis and intermedius compatible with muscle strain. No frank avulsion. There is irregularity of the left iliac crest without associated marrow edema or soft tissue mass most in keeping with mod trauma or surgical intervention. IMPRESSION: Acute, impacted, mildly angulated right subcapital femoral neck fracture Increased signal in keeping with muscular strain involving the a right gluteal musculature as well as the vastus lateralis and intermedius. No frank tendinous avulsion. Mild superimposed bilateral hip degenerative arthritis. Electronically Signed   By: Fidela Salisbury MD   On: 03/02/2020 22:36   CT ABDOMEN PELVIS W CONTRAST  Result Date: 02/28/2020 CLINICAL DATA:  Clinical high probability for pulmonary embolus. Shortness of breath. Abdominal distension and jaundice. EXAM: CT ANGIOGRAPHY CHEST CT ABDOMEN AND PELVIS WITH CONTRAST TECHNIQUE: Multidetector CT imaging of the chest was  performed using the standard protocol during bolus administration of intravenous contrast. Multiplanar CT image reconstructions and MIPs were obtained to evaluate the vascular anatomy. Multidetector CT imaging of the abdomen and pelvis was performed using the standard protocol during bolus administration of intravenous contrast. CONTRAST:  10m OMNIPAQUE IOHEXOL 350 MG/ML SOLN COMPARISON:  CT chest 05/19/2017 abdominal sonogram 11/12/2011 FINDINGS: CTA CHEST FINDINGS Cardiovascular: Satisfactory opacification of the pulmonary arteries to the segmental level. Respiratory motion artifact however diminishes exam detail within the lower lobe pulmonary arteries at the segmental and subsegmental levels. The main pulmonary artery is patent. No central obstructing embolus. No lobar or segmental pulmonary artery filling defects identified bilaterally. The ascending thoracic aorta appears increased in caliber measuring 4.2 cm. Aortic atherosclerosis and coronary artery calcifications. Mild cardiac enlargement. No pericardial effusion. Mediastinum/Nodes: Normal appearance of the thyroid gland. The trachea appears patent and is midline. Normal appearance of the esophagus. No enlarged axillary, supraclavicular or mediastinal lymph nodes. Prominent prevascular and right paratracheal nodes are identified measuring up to 1.3 cm. Lungs/Pleura: Moderate bilateral pleural effusions are identified. Extensive interstitial and airspace disease is noted within the right upper lobe, left upper lobe, lingula and right middle lobe. Subsegmental atelectasis and volume loss noted within both lower lung zones. Musculoskeletal: The bones appear diffusely osteopenic. No acute or suspicious osseous findings. Review of the  MIP images confirms the above findings. CT ABDOMEN and PELVIS FINDINGS Hepatobiliary: No suspicious liver abnormality. Mural calcifications are identified within the gallbladder fundus, image 27/4. No bile duct dilatation.  Pancreas: Unremarkable. No pancreatic ductal dilatation or surrounding inflammatory changes. Spleen: Normal in size without focal abnormality. Adrenals/Urinary Tract: Normal adrenal glands. No hydronephrosis identified bilaterally. Bilateral kidney cysts are identified. The largest arises from the inferior pole of right kidney measuring 8.5 cm. There is a hyper dense lesion arising from the anterior cortex of the upper pole of right kidney measuring 0.7 by 0.9 cm, image 28/4. Additional, subcentimeter kidney lesions are noted bilaterally which are too small to reliably characterize. No hydronephrosis identified bilaterally. No hydroureter or ureteral lithiasis. Stomach/Bowel: Stomach is nondistended. The appendix is visualized and appears normal. No abnormal bowel dilatation, inflammation or distension. Distal colonic diverticula identified without acute inflammation. Moderate stool burden noted throughout the colon Vascular/Lymphatic: Aortic atherosclerosis. No aneurysm. No abdominopelvic adenopathy. Reproductive: Prostate gland is obscured by streak artifact from right hip arthroplasty. Other: No significant free fluid or fluid collections. Musculoskeletal: Bilateral sacroiliitis noted, right greater than left. Previous right hip arthroplasty. Multi level degenerative disc disease is noted throughout the lumbar spine. This is most advanced at L5-S1. Acute appearing superior endplate deformity is noted at the L2 level. With loss of approximately 15% of the vertebral body height centrally. Review of the MIP images confirms the above findings. IMPRESSION: 1. No evidence for acute pulmonary embolus. 2. Bilateral pleural effusions with extensive bilateral interstitial and airspace disease identified. Findings are concerning for congestive heart failure. Superimposed multifocal pneumonia not excluded. 3. No acute findings within the abdomen or pelvis. 4. Mild acute appearing superior endplate deformity at L2. 5. There  is a hyperdense lesion arising off the anterior cortex of the left kidney which does not meet criteria for simple cyst. This may represent a hemorrhagic cyst or solid enhancing lesion. When the patient is clinically stable and able to follow directions and hold their breath (preferably as an outpatient) further evaluation with dedicated abdominal MRI should be considered. 6. Bilateral kidney cysts. Aortic Atherosclerosis (ICD10-I70.0). Electronically Signed   By: Kerby Moors M.D.   On: 03/15/2020 12:23   DG Chest Port 1 View  Result Date: 03/23/2020 CLINICAL DATA:  Follow-up for abnormal chest radiograph. Respiratory distress. History of COPD and hypertension. EXAM: PORTABLE CHEST 1 VIEW COMPARISON:  03/21/2020 and older exams. FINDINGS: Cardiac silhouette is mildly enlarged but stable. Bilateral interstitial and airspace lung opacities are unchanged. Bilateral pleural effusions, more apparent on the left, also stable. No pneumothorax. IMPRESSION: 1. No change in lung aeration since the most recent prior exam. Findings may reflect pulmonary edema due to congestive heart failure, multifocal infection or a combination. Electronically Signed   By: Lajean Manes M.D.   On: 03/23/2020 10:14   DG Chest Port 1 View  Result Date: 03/21/2020 CLINICAL DATA:  Post LEFT thoracentesis EXAM: PORTABLE CHEST 1 VIEW COMPARISON:  Portable exam 1155 hours compared to 0855 hours FINDINGS: Enlargement of cardiac silhouette. Diffuse BILATERAL pulmonary infiltrates. Decreased LEFT pleural effusion post thoracentesis. No pneumothorax or acute osseous findings. IMPRESSION: No pneumothorax following LEFT thoracentesis. Persistent diffuse BILATERAL pulmonary infiltrates Electronically Signed   By: Lavonia Dana M.D.   On: 03/21/2020 12:10   DG Chest Port 1 View  Result Date: 03/21/2020 CLINICAL DATA:  Acute on chronic hypoxic respiratory failure, CHF EXAM: PORTABLE CHEST 1 VIEW COMPARISON:  Chest radiograph from one day prior.  FINDINGS: Stable cardiomediastinal silhouette  with mild cardiomegaly. No pneumothorax. Small bilateral pleural effusions, left greater than right, mildly increased. Diffuse patchy hazy opacities throughout both lungs, slightly worsened. IMPRESSION: 1. Mild cardiomegaly. 2. Small bilateral pleural effusions, left greater than right, mildly increased. 3. Diffuse patchy hazy opacities throughout both lungs, slightly worsened, differential includes cardiogenic pulmonary edema, multifocal pneumonia or ARDS. Electronically Signed   By: Ilona Sorrel M.D.   On: 03/21/2020 09:04   DG Chest Port 1 View  Result Date: 03/20/2020 CLINICAL DATA:  Post thoracentesis EXAM: PORTABLE CHEST 1 VIEW COMPARISON:  Radiograph 03/20/2020 FINDINGS: Interval decrease in the size of a right pleural effusion post thoracentesis. No large pneumothorax. Persistent left effusion and heterogeneous airspace opacities bilaterally, not significantly changed from comparison. Cardiomediastinal contours are partially obscured by opacity albeit with stable cardiomegaly and a calcified aorta. No acute osseous or soft tissue abnormality. Degenerative changes are present in the imaged spine and shoulders. Telemetry leads overlie the chest. IMPRESSION: 1. Interval decrease in size of a right pleural effusion post thoracentesis. No large pneumothorax. 2. Otherwise unchanged left effusion and bilateral heterogeneous opacities possibly asymmetric edema and/or infection. Electronically Signed   By: Lovena Le M.D.   On: 03/20/2020 16:55   DG Chest Port 1 View  Result Date: 03/20/2020 CLINICAL DATA:  Respiratory distress. EXAM: PORTABLE CHEST 1 VIEW COMPARISON:  02/25/2020 and chest CT 03/09/2020 FINDINGS: A single view of the chest demonstrates patchy bilateral lung densities. Evidence for bilateral pleural effusions. Airspace densities in the right mid lung has slightly worsened but slightly decreased in the left mid lung. Heart size is slightly  prominent but accentuated by technique. Heart size is grossly stable. Negative for pneumothorax. IMPRESSION: 1. Persistent bilateral lung densities that have slightly changed as described. Findings are suggestive for asymmetric pulmonary edema. Infection cannot be excluded. 2. Bilateral pleural effusions. Electronically Signed   By: Markus Daft M.D.   On: 03/20/2020 10:40   DG Chest Portable 1 View  Result Date: 03/15/2020 CLINICAL DATA:  Short of breath. EXAM: PORTABLE CHEST 1 VIEW COMPARISON:  03/02/20 FINDINGS: Stable cardiomediastinal contours. Aortic atherosclerosis. Increase in bilateral pleural effusions and interstitial edema. New bilateral airspace opacities are noted particularly involving the upper lobes. IMPRESSION: 1. Worsening CHF pattern. 2. New bilateral airspace opacities concerning for multifocal infection. Electronically Signed   By: Kerby Moors M.D.   On: 03/18/2020 08:24   ECHOCARDIOGRAM COMPLETE  Result Date: 03/17/2020    ECHOCARDIOGRAM REPORT   Patient Name:   CHAYANNE SPEIR [facility] Date of Exam: 03/17/2020 Medical Rec #:  [id number]      Height:       70.0 in Accession #:    [id number]     Weight:       205.0 lb Date of Birth:  05/31/1929      BSA:          2.109 m Patient Age:    56 years       BP:           133/74 mmHg Patient Gender: M              HR:           109 bpm. Exam Location:  [facility] Procedure: 2D Echo, Cardiac Doppler and Color Doppler Indications:     CHF- acute diastolic 003.70  History:         Patient has prior history of Echocardiogram examinations, most  recent 11/11/2019. COPD and PAD; Risk Factors:Hypertension.                  Heart failure with improved ejection fraction Moderate                  mitral regurgitation.  Sonographer:     Sherrie Sport RDCS (AE) Referring Phys:  [phone] CHRISTOPHER END Diagnosing Phys: Kate Sable MD IMPRESSIONS  1. Left ventricular ejection fraction, by estimation, is 50 to 55%. The left ventricle has low normal  function. The left ventricle has no regional wall motion abnormalities. There is moderate left ventricular hypertrophy. Left ventricular diastolic function could not be evaluated.  2. Right ventricular systolic function is low normal. The right ventricular size is mildly enlarged.  3. Left atrial size was mildly dilated.  4. Right atrial size was moderately dilated.  5. The mitral valve is degenerative. Trivial mitral valve regurgitation.  6. The aortic valve is tricuspid. Aortic valve regurgitation is mild. FINDINGS  Left Ventricle: Left ventricular ejection fraction, by estimation, is 50 to 55%. The left ventricle has low normal function. The left ventricle has no regional wall motion abnormalities. The left ventricular internal cavity size was normal in size. There is moderate left ventricular hypertrophy. Left ventricular diastolic function could not be evaluated. Right Ventricle: The right ventricular size is mildly enlarged. No increase in right ventricular wall thickness. Right ventricular systolic function is low normal. Left Atrium: Left atrial size was mildly dilated. Right Atrium: Right atrial size was moderately dilated. Pericardium: There is no evidence of pericardial effusion. Mitral Valve: The mitral valve is degenerative in appearance. Mild mitral annular calcification. Trivial mitral valve regurgitation. Tricuspid Valve: The tricuspid valve is normal in structure. Tricuspid valve regurgitation is not demonstrated. Aortic Valve: The aortic valve is tricuspid. Aortic valve regurgitation is mild. Aortic valve mean gradient measures 2.5 mmHg. Aortic valve peak gradient measures 4.7 mmHg. Aortic valve area, by VTI measures 4.48 cm. Pulmonic Valve: The pulmonic valve was normal in structure. Pulmonic valve regurgitation is not visualized. Aorta: The aortic root is normal in size and structure. IAS/Shunts: No atrial level shunt detected by color flow Doppler.  LEFT VENTRICLE PLAX 2D LVIDd:         4.11 cm  LVIDs:         3.07 cm LV PW:         1.65 cm LV IVS:        1.77 cm LVOT diam:     2.10 cm LV SV:         68 LV SV Index:   32 LVOT Area:     3.46 cm  RIGHT VENTRICLE RV Basal diam:  4.51 cm LEFT ATRIUM           Index       RIGHT ATRIUM           Index LA diam:      4.00 cm 1.90 cm/m  RA Area:     30.40 cm LA Vol (A2C): 48.3 ml 22.90 ml/m RA Volume:   109.00 ml 51.67 ml/m LA Vol (A4C): 71.8 ml 34.04 ml/m  AORTIC VALVE                   PULMONIC VALVE AV Area (Vmax):    3.19 cm    PV Vmax:        0.71 m/s AV Area (Vmean):   3.26 cm    PV Peak grad:   2.0  mmHg AV Area (VTI):     4.48 cm    RVOT Peak grad: 4 mmHg AV Vmax:           108.50 cm/s AV Vmean:          74.250 cm/s AV VTI:            0.152 m AV Peak Grad:      4.7 mmHg AV Mean Grad:      2.5 mmHg LVOT Vmax:         99.80 cm/s LVOT Vmean:        69.800 cm/s LVOT VTI:          0.196 m LVOT/AV VTI ratio: 1.29  AORTA Ao Root diam: 3.70 cm MITRAL VALVE                TRICUSPID VALVE MV Area (PHT): 4.06 cm     TR Peak grad:   19.5 mmHg MV Decel Time: 187 msec     TR Vmax:        221.00 cm/s MV E velocity: 104.00 cm/s                             SHUNTS                             Systemic VTI:  0.20 m                             Systemic Diam: 2.10 cm Kate Sable MD Electronically signed by Kate Sable MD Signature Date/Time: 03/17/2020/2:43:01 PM    Final    US THYROID  Result Date: 03/04/2020 CLINICAL DATA:  Palpable abnormality.  Enlarged thyroid gland. EXAM: THYROID ULTRASOUND TECHNIQUE: Ultrasound examination of the thyroid gland and adjacent soft tissues was performed. COMPARISON:  None. FINDINGS: Parenchymal Echotexture: Normal Isthmus: 0.3 cm Right lobe: 4.3 x 2.2 x 1.9 cm Left lobe: 4.4 x 1.9 x 1.7 cm _________________________________________________________ Estimated total number of nodules >/= 1 cm: 0 Number of spongiform nodules >/=  2 cm not described below (TR1): 0 Number of mixed cystic and solid nodules >/= 1.5 cm not  described below (TR2): 0 _________________________________________________________ No discrete nodules are seen within the thyroid gland. No abnormal lymph nodes identified. IMPRESSION: Normal thyroid ultrasound. The thyroid gland is not enlarged and no focal nodules are identified. The above is in keeping with the ACR TI-RADS recommendations - J Am Coll Radiol 2017;14:587-595. Electronically Signed   By: Aletta Edouard M.D.   On: 03/04/2020 13:54   DG HIP OPERATIVE UNILAT W OR W/O PELVIS RIGHT  Result Date: 03/03/2020 CLINICAL DATA:  Total hip replacement EXAM: OPERATIVE RIGHT HIP   1 VIEW TECHNIQUE: Fluoroscopic spot image(s) were submitted for interpretation post-operatively. COMPARISON:  Pelvis radiograph March 02, 2020; CT and MR pelvis March 02, 2020 FLUOROSCOPY TIME:  0 minutes 6 seconds; 2 acquired images FINDINGS: Initial image demonstrates subcapital femoral neck fracture on the right. Subsequent image shows a total hip replacement right with visualized prosthetic components well-seated on frontal view. No fracture or dislocation evident post prosthetic placement. IMPRESSION: Total hip replacement on the right with visualized prosthetic components well-seated on frontal view. No fracture or dislocation evident on image demonstrating prosthesis placement. Electronically Signed   By: Lowella Grip III M.D.   On: 03/03/2020 13:53   DG Hip Unilat  With Pelvis 2-3 Views Left  Result Date: 03/02/2020 CLINICAL DATA:  Pain EXAM: DG HIP (WITH OR WITHOUT PELVIS) 2-3V LEFT COMPARISON:  None. FINDINGS: Frontal pelvis as well as frontal and lateral left hip images were obtained. No fracture or dislocation. There is mild symmetric narrowing of each hip joint. There is moderate osteoarthritic change in the right sacroiliac joint. There is also degenerative change in the lower lumbar spine. No erosions. There is bony overgrowth along the left lateral iliac crest. IMPRESSION: Mild symmetric narrowing  each hip joint. 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 Bony overgrowth along the lateral left iliac crest measuring 8.8 x 3.3 cm. Question prior trauma in this area. Neoplastic etiology for this somewhat unusual appearance must be questioned. Nonemergent CT of the pelvis to further evaluate this region is felt to be warranted. No acute fracture or dislocation. Electronically Signed   By: Lowella Grip III M.D.   On: 03/02/2020 13:07   DG HIP UNILAT W OR W/O PELVIS 2-3 VIEWS RIGHT  Result Date: 03/03/2020 CLINICAL DATA:  Post RIGHT total hip arthroplasty EXAM: DG HIP (WITH OR WITHOUT PELVIS) 2-3V RIGHT COMPARISON:  03/02/2020 FINDINGS: RIGHT hip prosthesis newly identified. No acute fracture or dislocation. Bones demineralized. Overlying skin clips and postsurgical changes of soft tissues. IMPRESSION: RIGHT hip prosthesis without acute complication. Electronically Signed   By: Lavonia Dana M.D.   On: 03/03/2020 14:13   US Abdomen Limited RUQ (LIVER/GB)  Result Date: 03/21/2020 CLINICAL DATA:  Hyperbilirubinemia. EXAM: ULTRASOUND ABDOMEN LIMITED RIGHT UPPER QUADRANT COMPARISON:  CT abdomen pelvis 03/18/2020 FINDINGS: Gallbladder: Dense shadowing throughout the gallbladder compatible with calcification. There is peripheral calcification in the gallbladder fundus on the prior CT. This is presumably due to calcified gallstones although gallbladder wall calcification could have a similar appearance. Negative sonographic Murphy sign. Common bile duct: Diameter: 2.5 mm Liver: Increased echogenicity liver diffusely without focal liver lesion. Portal vein is patent on color Doppler imaging with normal direction of blood flow towards the liver. Other: None. IMPRESSION: Extensive shadowing due to gallbladder calcification. This is presumably a large gallstone although gallbladder wall calcification could also be present. This limits evaluation of the gallbladder lumen. No biliary dilatation  Hyperechoic liver suggesting fatty infiltration. Electronically Signed   By: Franchot Gallo M.D.   On: 03/21/2020 09:26   US THORACENTESIS ASP PLEURAL SPACE W/IMG GUIDE  Result Date: 03/21/2020 INDICATION: Patient with history of CHF, COPD, respiratory failure ,atrial fibrillation, bilateral pleural effusions; status post right thoracentesis on 03/20/20; request now received for diagnostic and therapeutic left thoracentesis. EXAM: ULTRASOUND GUIDED DIAGNOSTIC THERAPEUTIC LEFT THORACENTESIS MEDICATIONS: 1% lidocaine to skin and subcutaneous tissue COMPLICATIONS: None immediate. PROCEDURE: An ultrasound guided thoracentesis was thoroughly discussed with the patient and questions answered. The benefits, risks, alternatives and complications were also discussed. The patient understands and wishes to proceed with the procedure. Written consent was obtained. Ultrasound was performed to localize and mark an adequate pocket of fluid in the left chest. The area was then prepped and draped in the normal sterile fashion. 1% Lidocaine was used for local anesthesia. Under ultrasound guidance a 6 Fr Safe-T-Centesis catheter was introduced. Thoracentesis was performed. The catheter was removed and a dressing applied. FINDINGS: A total of approximately 550 cc of yellow fluid was removed. Samples were sent to the laboratory as requested by the clinical team. IMPRESSION: Successful ultrasound guided diagnostic and therapeutic left thoracentesis yielding 550 cc of pleural fluid. Read by: Rowe Robert, PA-C Electronically Signed   By:  Markus Daft M.D.   On: 03/21/2020 11:52   US THORACENTESIS ASP PLEURAL SPACE W/IMG GUIDE  Result Date: 03/20/2020 INDICATION: Patient with history of acute hypoxic respiratory failure with bilateral pleural effusions. Request is for therapeutic and diagnostic thoracentesis EXAM: ULTRASOUND GUIDED THERAPEUTIC AND DIAGNOSTIC THORACENTESIS MEDICATIONS: Lidocaine 1% 10 mL COMPLICATIONS: None  immediate. PROCEDURE: An ultrasound guided thoracentesis was thoroughly discussed with the patient and questions answered. The benefits, risks, alternatives and complications were also discussed. The patient understands and wishes to proceed with the procedure. Written consent was obtained. Ultrasound was performed to localize and mark an adequate pocket of fluid in the right chest. The area was then prepped and draped in the normal sterile fashion. 1% Lidocaine was used for local anesthesia. Under ultrasound guidance a 6 Fr Safe-T-Centesis catheter was introduced. Thoracentesis was performed. The catheter was removed and a dressing applied. FINDINGS: A total of approximately 450 mL of straw-colored fluid was removed. Samples were sent to the laboratory as requested by the clinical team. IMPRESSION: Successful ultrasound guided therapeutic and diagnostic right-sided thoracentesis yielding 450 mL of pleural fluid. Read by: Rushie Nyhan, NP Electronically Signed   By: Aletta Edouard M.D.   On: 03/20/2020 16:21    Consults: Treatment Team:  Kate Sable, MD Ottie Glazier, MD [facility], Ander Gaster, MD Hessie Knows, MD   Subjective:    Overnight Issues: Continues to have difficulties with sleeping.  Oxygen requirements remain the same.  Still on high flow O2.  Objective:  Vital signs for last 24 hours: Temp:  [96.6 F (35.9 C)-98 F (36.7 C)] 96.6 F (35.9 C) (12/04 1945) Pulse Rate:  [50-150] 85 (12/04 1900) Resp:  [18-41] 18 (12/04 1900) BP: (97-145)/(54-117) 140/54 (12/04 1900) SpO2:  [89 %-99 %] 90 % (12/04 2115) FiO2 (%):  [100 %] 100 % (12/04 2115)  Hemodynamic parameters for last 24 hours:    Intake/Output from previous day: 12/03 0701 - 12/04 0700 In: 200 [IV Piggyback:200] Out: 750 [Urine:750]  Intake/Output this shift: No intake/output data recorded.  Vent settings for last 24 hours: FiO2 (%):  [100 %] 100 %  Physical Exam:  GENERAL: Elderly male, acutely on  chronically ill-appearing, uncomfortable, tachypneic HEAD: Normocephalic, atraumatic.  EYES: Pupils equal, round, reactive to light.  No scleral icterus.  MOUTH: Oral mucosa moist, no thrush. NECK: Supple. No thyromegaly. Trachea midline. No JVD.  No adenopathy. PULMONARY: Scattered rhonchi, crackles throughout.  No wheezes. CARDIOVASCULAR: S1 and S2.  Irregular rate with rate at 100-115 ABDOMEN: Nondistended, soft, nontender. MUSCULOSKELETAL: No joint deformity, no clubbing, no edema.  NEUROLOGIC: No overt focal deficit SKIN: Intact,warm,dry. 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 Continue high flow O2 Accept saturations of 88 to 92% titrate accordingly Increased steroids to 40 mg twice daily Connective tissue markers elevated Inflammatory markers elevated,KL 6 pending This is an ILD flare, consistent with ILD flare Prognosis with ILD flare is poor overall Patient's family apprised of situation Patient is DNR/DNI  Acute decompensation of diastolic heart failure Atrial fibrillation with RVR Bilateral pleural effusions By chest x-ray effusions responded to diuresis and thoracentesis Transudative  effusions indicative of heart failure Diuretics as tolerates Would prefer alternative agent to amiodarone for A. fib control Discussed with Dr. Garen Lah he will load patient with dig Hopefully will be able to DC amiodarone Continue supportive care  Anemia No evidence of bleeding H&H stable Transfuse if  hemoglobin less than 7   LOS: 9 days   Discussed with Dr. Leslye Peer.  Care coordination with nurse was performed.  Had long discussion with the patient's family (wife, 3 daughters and son-in-law) on 12/3 apprised that ILD flare carries a very poor prognosis.  They would like to continue the course over the weekend and reassess 12/6 if his condition  does not improve they may consider transitioning to comfort care.   Critical Care Total Time*: Level 3 follow-up  C. Derrill Kay, MD [facility] 03/25/2020  *This note was dictated using voice recognition software/Dragon.  Despite best efforts to proofread, errors can occur which can change the meaning.  Any change was purely unintentional.

## 2020-03-25 NOTE — Plan of Care (Addendum)
Patient is sitting in bed. Daughter is at bedside. They are discussing care moving forward. Answered questions regarding continued aggressive care and comfort focused care. Patient wants to continue to treat the treatable over the weekend, but states he would not want CPR or ventilator support. CCM has spoken with patient as well regarding this plan.   No charge.

## 2020-03-26 DIAGNOSIS — R918 Other nonspecific abnormal finding of lung field: Secondary | ICD-10-CM | POA: Diagnosis present

## 2020-03-26 DIAGNOSIS — J849 Interstitial pulmonary disease, unspecified: Secondary | ICD-10-CM | POA: Diagnosis present

## 2020-03-26 MED ORDER — HALOPERIDOL 0.5 MG PO TABS
0.5000 mg | ORAL_TABLET | ORAL | Status: DC | PRN
  Filled 2020-03-26: qty 1

## 2020-03-26 MED ORDER — FUROSEMIDE 10 MG/ML IJ SOLN
INTRAMUSCULAR | Status: AC
  Filled 2020-03-26: qty 4

## 2020-03-26 MED ORDER — GLYCOPYRROLATE 0.2 MG/ML IJ SOLN: 0.2000 mg | INTRAMUSCULAR | Status: DC | PRN

## 2020-03-26 MED ORDER — HALOPERIDOL LACTATE 2 MG/ML PO CONC
0.5000 mg | ORAL | Status: DC | PRN
  Filled 2020-03-26: qty 0.3

## 2020-03-26 MED ORDER — MORPHINE SULFATE (PF) 2 MG/ML IV SOLN
INTRAVENOUS | Status: AC
  Administered 2020-03-26: 2 mg via INTRAVENOUS
  Filled 2020-03-26: qty 1

## 2020-03-26 MED ORDER — POLYVINYL ALCOHOL 1.4 % OP SOLN
1.0000 [drp] | Freq: Four times a day (QID) | OPHTHALMIC | Status: DC | PRN
  Filled 2020-03-26: qty 15

## 2020-03-26 MED ORDER — GLYCOPYRROLATE 1 MG PO TABS
1.0000 mg | ORAL_TABLET | ORAL | Status: DC | PRN
  Filled 2020-03-26: qty 1

## 2020-03-26 MED ORDER — MORPHINE SULFATE (PF) 2 MG/ML IV SOLN: 2.0000 mg | INTRAVENOUS | Status: DC | PRN

## 2020-03-26 MED ORDER — HALOPERIDOL LACTATE 5 MG/ML IJ SOLN
0.5000 mg | INTRAMUSCULAR | Status: DC | PRN
  Administered 2020-03-26: 0.5 mg via INTRAVENOUS
  Filled 2020-03-26: qty 1

## 2020-03-26 MED ORDER — SODIUM CHLORIDE 0.9 % IV SOLN
1000.0000 mg | Freq: Once | INTRAVENOUS | Status: DC
  Filled 2020-03-26: qty 8

## 2020-03-26 MED ORDER — FUROSEMIDE 10 MG/ML IJ SOLN
40.0000 mg | Freq: Once | INTRAMUSCULAR | Status: AC
  Administered 2020-03-26: 40 mg via INTRAVENOUS

## 2020-03-29 ENCOUNTER — Ambulatory Visit: Payer: Medicare Other | Admitting: Internal Medicine

## 2020-03-30 ENCOUNTER — Ambulatory Visit: Payer: Medicare Other | Admitting: Family

## 2020-03-30 LAB — FUNGITELL, SERUM: Fungitell Result: <31 pg/mL (ref <80)

## 2020-04-03 LAB — MISC LABCORP TEST (SEND OUT): Labcorp test code: 3001866

## 2020-04-19 LAB — FUNGUS CULTURE WITH STAIN

## 2020-04-19 LAB — FUNGUS CULTURE RESULT

## 2020-04-19 LAB — FUNGAL ORGANISM REFLEX

## 2020-04-22 NOTE — Progress Notes (Signed)
Patient is without respirations, no pulse noted and pupils fixed. Dr. Reinaldo Berber aware and currently at bedside.

## 2020-04-22 NOTE — Significant Event (Signed)
  Death Pronouncement Note  I was called to see patient for without respiration, no pulse, and pupil fixed. On exam the patient did not respond to verbal or physical stimuli. Absent heart and breath sounds.  Absent peripheral pulses. Pupils are fixed and dilated. Patient pronounced dead at 02:18. Dr.  Next of kin/family including POA Dace Denn (daughter) and wife were bedside. Autopsy declined. Family provided funeral home information. ___________________ Samuella Cota Reinaldo Berber, MD [facility]

## 2020-04-22 NOTE — Significant Event (Signed)
  Critical Care Note   Family all arrived bedside including daughters (including POA Varian Innes), wife, son, grandkids, in-laws, etc.  I discussed the patient's grave condition requiring 100% NRB and 100% HFNC with saturation still in the low to mid 58s and not responding to therapies.  I provided details of the working diagnoses and therapies and interventions that we have given.  The daughter and family were all in agreement to make him comfortable.  She states their priority is that he does not suffer.  They were agreeable to removing both the NRB mask and HFNC, place him on nasal cannula, give pain meds, and other comfort focused therapies, stopping labs, etc. They understand he may pass away in short time.  They want to be by his side as he does.  All questions were answered.   ___________________ Samuella Cota Reinaldo Berber, MD [facility]

## 2020-04-22 NOTE — Significant Event (Addendum)
  Critical Care Note -- Cross Cover Notified by RN, the patient is now on both 100% NRB mask and 100% HFNC, and still saturating at 76%.  He was seen bedside.  He appears quite sleepy, and arouse just briefly.  He is a poor historian at this time.  He is moderately labored.  Earlier in the evening, he was only on HFNC 100% 55L.  His IVF was stopped several hours ago, and he received Lasix 40mg  IV x 1 with 250cc output, and no improvement in respiratory status.    Given his severe acute hypoxic respiratory failure, emphysema, heart failure, debility, advanced age, and overall decline in the past several months, palliative is on board, and in discussion with the daughter, they wanted to continue care over the weekend, but DNR and DNI.  There was suggestion that if he did not improve in the next couple of days, they would consider comfort care. Given his decline this evening, I spoke with the daughter Cranford Blessinger by phone.  She states she is POA.  I advised her that his condition is worsening and very tenuous with sats in the mid 70s despite 100% NRB and HFNC.  I advised her he does not appear to be responding to therapies.    -CTA ango on 11/25 neg for PE, shows diffused airspace dz with crazy paving -Had bilateral thoracenteses to reduce fluid burden -- fluid appears transudative -Had been diuresed and - 4.6L since admit -Receiving Solumedrol 40mg  IV BID -his serology was positive for ANA, dsDNA with elevated inflammatory markers  Blood pressure 116/62, pulse 94, temperature (!) 96.6 F (35.9 C), temperature source Axillary, resp. rate 19, height 5\' 10"  (1.778 m), weight 93 kg, SpO2 (!) 79 %. Gen:  Moderate respiratory distress HEENT:  NRB mask and HCNC, dry MM Chest:  Bilateral crackles CVS:  Irregular Abd:  Soft, + BS Ext:  No significant edema Neuro:  Groggy  A/P Acute on chronic hypoxic respiratory failure -- multifactorial including pulmonary edema, perhaps drug induced pneumonitis,  possible CT-ILD flare, COPD flare, less likely infectious.   Will give another dose of Lasix 40mg   Give pulse dose steroid solumedrol 1,000mg  IV x 1 given possibility of CT-ILD flare  Continue morphine 1mg  as needed for pain, respiratory distress  Family to come in, will discuss goals of care further upon their arrival   Critical care time 45 minutes.  ___________________ Samuella Cota Reinaldo Berber, MD [facility]     ___________________ Samuella Cota. Reinaldo Berber, MD [facility]

## 2020-04-22 NOTE — Progress Notes (Signed)
Family at bedside. Dr. Reinaldo Berber also at bedside to update family.

## 2020-04-22 DEATH — deceased

## 2020-05-04 LAB — ACID FAST CULTURE WITH REFLEXED SENSITIVITIES (MYCOBACTERIA): Acid Fast Culture: NEGATIVE
